# Patient Record
Sex: Female | Born: 1950 | Race: White | Hispanic: No | Smoking: Never smoker
Health system: Southern US, Community
[De-identification: ages and names within clinical notes are randomized; demographics above are authoritative.]

## PROBLEM LIST (undated history)

## (undated) DIAGNOSIS — I499 Cardiac arrhythmia, unspecified: Secondary | ICD-10-CM

## (undated) DIAGNOSIS — K760 Fatty (change of) liver, not elsewhere classified: Secondary | ICD-10-CM

## (undated) DIAGNOSIS — N950 Postmenopausal bleeding: Secondary | ICD-10-CM

## (undated) DIAGNOSIS — I10 Essential (primary) hypertension: Secondary | ICD-10-CM

## (undated) DIAGNOSIS — I4891 Unspecified atrial fibrillation: Secondary | ICD-10-CM

## (undated) DIAGNOSIS — F32A Depression, unspecified: Secondary | ICD-10-CM

## (undated) DIAGNOSIS — M8589 Other specified disorders of bone density and structure, multiple sites: Secondary | ICD-10-CM

## (undated) DIAGNOSIS — C3412 Malignant neoplasm of upper lobe, left bronchus or lung: Secondary | ICD-10-CM

## (undated) DIAGNOSIS — R0600 Dyspnea, unspecified: Secondary | ICD-10-CM

## (undated) DIAGNOSIS — R609 Edema, unspecified: Secondary | ICD-10-CM

## (undated) DIAGNOSIS — F419 Anxiety disorder, unspecified: Secondary | ICD-10-CM

## (undated) DIAGNOSIS — N200 Calculus of kidney: Secondary | ICD-10-CM

## (undated) DIAGNOSIS — C8590 Non-Hodgkin lymphoma, unspecified, unspecified site: Secondary | ICD-10-CM

## (undated) DIAGNOSIS — I480 Paroxysmal atrial fibrillation: Secondary | ICD-10-CM

## (undated) DIAGNOSIS — Z8679 Personal history of other diseases of the circulatory system: Secondary | ICD-10-CM

## (undated) DIAGNOSIS — Z87442 Personal history of urinary calculi: Secondary | ICD-10-CM

## (undated) DIAGNOSIS — I639 Cerebral infarction, unspecified: Secondary | ICD-10-CM

## (undated) DIAGNOSIS — N809 Endometriosis, unspecified: Secondary | ICD-10-CM

## (undated) DIAGNOSIS — R002 Palpitations: Secondary | ICD-10-CM

## (undated) DIAGNOSIS — J309 Allergic rhinitis, unspecified: Secondary | ICD-10-CM

## (undated) DIAGNOSIS — F329 Major depressive disorder, single episode, unspecified: Secondary | ICD-10-CM

## (undated) DIAGNOSIS — B029 Zoster without complications: Secondary | ICD-10-CM

## (undated) DIAGNOSIS — M1990 Unspecified osteoarthritis, unspecified site: Secondary | ICD-10-CM

## (undated) DIAGNOSIS — K589 Irritable bowel syndrome without diarrhea: Secondary | ICD-10-CM

## (undated) DIAGNOSIS — C3490 Malignant neoplasm of unspecified part of unspecified bronchus or lung: Secondary | ICD-10-CM

## (undated) DIAGNOSIS — N189 Chronic kidney disease, unspecified: Secondary | ICD-10-CM

## (undated) HISTORY — PX: CHOLECYSTECTOMY: SHX55

## (undated) HISTORY — PX: AUGMENTATION MAMMAPLASTY: SUR837

## (undated) HISTORY — PX: HERNIA REPAIR: SHX51

## (undated) HISTORY — DX: Essential (primary) hypertension: I10

## (undated) HISTORY — DX: Endometriosis, unspecified: N80.9

## (undated) HISTORY — PX: LUNG REMOVAL, PARTIAL: SHX233

## (undated) HISTORY — PX: DILATION AND CURETTAGE OF UTERUS: SHX78

## (undated) HISTORY — PX: FRACTURE SURGERY: SHX138

## (undated) HISTORY — PX: COLONOSCOPY: SHX174

## (undated) HISTORY — PX: DIAGNOSTIC LAPAROSCOPY: SUR761

## (undated) HISTORY — PX: HEMORRHOIDECTOMY WITH HEMORRHOID BANDING: SHX5633

## (undated) MED FILL — Dexamethasone Sodium Phosphate Inj 100 MG/10ML: INTRAMUSCULAR | Qty: 1 | Status: AC

---

## 2005-07-02 ENCOUNTER — Ambulatory Visit: Payer: Self-pay | Admitting: *Deleted

## 2005-11-27 ENCOUNTER — Ambulatory Visit: Payer: Self-pay | Admitting: Internal Medicine

## 2005-12-11 ENCOUNTER — Emergency Department: Payer: Self-pay | Admitting: Emergency Medicine

## 2005-12-12 ENCOUNTER — Ambulatory Visit: Payer: Self-pay | Admitting: Emergency Medicine

## 2005-12-20 ENCOUNTER — Ambulatory Visit: Payer: Self-pay

## 2005-12-28 ENCOUNTER — Ambulatory Visit: Payer: Self-pay | Admitting: Oncology

## 2006-01-04 ENCOUNTER — Inpatient Hospital Stay: Payer: Self-pay | Admitting: General Surgery

## 2006-01-21 ENCOUNTER — Ambulatory Visit: Payer: Self-pay | Admitting: Oncology

## 2006-02-07 ENCOUNTER — Ambulatory Visit: Payer: Self-pay | Admitting: Oncology

## 2006-03-10 ENCOUNTER — Ambulatory Visit: Payer: Self-pay | Admitting: Oncology

## 2006-04-09 ENCOUNTER — Ambulatory Visit: Payer: Self-pay | Admitting: Oncology

## 2006-05-10 ENCOUNTER — Ambulatory Visit: Payer: Self-pay | Admitting: Oncology

## 2006-06-09 ENCOUNTER — Ambulatory Visit: Payer: Self-pay | Admitting: Oncology

## 2006-07-10 ENCOUNTER — Ambulatory Visit: Payer: Self-pay | Admitting: Oncology

## 2006-08-10 ENCOUNTER — Ambulatory Visit: Payer: Self-pay | Admitting: Oncology

## 2006-09-09 ENCOUNTER — Ambulatory Visit: Payer: Self-pay | Admitting: Oncology

## 2006-11-14 ENCOUNTER — Ambulatory Visit: Payer: Self-pay | Admitting: Oncology

## 2006-11-18 ENCOUNTER — Ambulatory Visit: Payer: Self-pay | Admitting: Oncology

## 2006-12-10 ENCOUNTER — Ambulatory Visit: Payer: Self-pay | Admitting: Oncology

## 2007-01-29 ENCOUNTER — Ambulatory Visit: Payer: Self-pay | Admitting: Oncology

## 2007-02-08 ENCOUNTER — Ambulatory Visit: Payer: Self-pay | Admitting: Oncology

## 2007-03-11 ENCOUNTER — Ambulatory Visit: Payer: Self-pay | Admitting: Oncology

## 2007-05-21 ENCOUNTER — Ambulatory Visit: Payer: Self-pay | Admitting: Oncology

## 2007-06-10 ENCOUNTER — Ambulatory Visit: Payer: Self-pay | Admitting: Oncology

## 2007-07-11 ENCOUNTER — Ambulatory Visit: Payer: Self-pay | Admitting: Oncology

## 2007-08-11 ENCOUNTER — Ambulatory Visit: Payer: Self-pay | Admitting: Oncology

## 2007-08-25 ENCOUNTER — Ambulatory Visit: Payer: Self-pay | Admitting: Internal Medicine

## 2007-08-28 ENCOUNTER — Ambulatory Visit: Payer: Self-pay | Admitting: Oncology

## 2007-09-10 ENCOUNTER — Ambulatory Visit: Payer: Self-pay | Admitting: Oncology

## 2007-10-01 ENCOUNTER — Ambulatory Visit: Payer: Self-pay | Admitting: Oncology

## 2007-10-11 ENCOUNTER — Ambulatory Visit: Payer: Self-pay | Admitting: Oncology

## 2007-11-10 ENCOUNTER — Ambulatory Visit: Payer: Self-pay | Admitting: Oncology

## 2007-12-11 ENCOUNTER — Ambulatory Visit: Payer: Self-pay | Admitting: Oncology

## 2007-12-29 ENCOUNTER — Ambulatory Visit: Payer: Self-pay | Admitting: Unknown Physician Specialty

## 2008-01-19 ENCOUNTER — Ambulatory Visit: Payer: Self-pay | Admitting: Oncology

## 2008-02-08 ENCOUNTER — Ambulatory Visit: Payer: Self-pay | Admitting: Oncology

## 2008-02-11 ENCOUNTER — Ambulatory Visit: Payer: Self-pay | Admitting: Internal Medicine

## 2008-03-10 ENCOUNTER — Ambulatory Visit: Payer: Self-pay | Admitting: Oncology

## 2008-04-09 ENCOUNTER — Ambulatory Visit: Payer: Self-pay | Admitting: Oncology

## 2008-05-10 ENCOUNTER — Ambulatory Visit: Payer: Self-pay | Admitting: Oncology

## 2008-05-31 ENCOUNTER — Ambulatory Visit: Payer: Self-pay | Admitting: Oncology

## 2008-06-09 ENCOUNTER — Ambulatory Visit: Payer: Self-pay | Admitting: Oncology

## 2008-07-05 ENCOUNTER — Ambulatory Visit: Payer: Self-pay | Admitting: Unknown Physician Specialty

## 2008-07-10 ENCOUNTER — Ambulatory Visit: Payer: Self-pay | Admitting: Oncology

## 2008-08-10 ENCOUNTER — Ambulatory Visit: Payer: Self-pay | Admitting: Oncology

## 2008-08-18 ENCOUNTER — Ambulatory Visit: Payer: Self-pay | Admitting: Oncology

## 2008-09-06 ENCOUNTER — Ambulatory Visit: Payer: Self-pay | Admitting: General Practice

## 2008-09-09 ENCOUNTER — Ambulatory Visit: Payer: Self-pay | Admitting: Oncology

## 2008-09-15 ENCOUNTER — Ambulatory Visit: Payer: Self-pay | Admitting: Internal Medicine

## 2008-12-10 ENCOUNTER — Ambulatory Visit: Payer: Self-pay | Admitting: Oncology

## 2008-12-15 ENCOUNTER — Ambulatory Visit: Payer: Self-pay | Admitting: Oncology

## 2009-01-01 ENCOUNTER — Emergency Department: Payer: Self-pay | Admitting: Emergency Medicine

## 2009-01-10 ENCOUNTER — Ambulatory Visit: Payer: Self-pay | Admitting: Oncology

## 2009-02-21 ENCOUNTER — Ambulatory Visit: Payer: Self-pay | Admitting: Oncology

## 2009-02-21 ENCOUNTER — Ambulatory Visit: Payer: Self-pay | Admitting: Internal Medicine

## 2009-03-10 ENCOUNTER — Ambulatory Visit: Payer: Self-pay | Admitting: Internal Medicine

## 2009-03-10 ENCOUNTER — Ambulatory Visit: Payer: Self-pay | Admitting: Oncology

## 2009-04-09 ENCOUNTER — Ambulatory Visit: Payer: Self-pay | Admitting: Oncology

## 2009-04-09 ENCOUNTER — Ambulatory Visit: Payer: Self-pay | Admitting: Internal Medicine

## 2009-05-10 ENCOUNTER — Ambulatory Visit: Payer: Self-pay | Admitting: Oncology

## 2009-05-27 ENCOUNTER — Ambulatory Visit: Payer: Self-pay | Admitting: Oncology

## 2009-06-09 ENCOUNTER — Ambulatory Visit: Payer: Self-pay | Admitting: Oncology

## 2009-07-10 ENCOUNTER — Ambulatory Visit: Payer: Self-pay | Admitting: Oncology

## 2009-07-14 ENCOUNTER — Ambulatory Visit: Payer: Self-pay | Admitting: Oncology

## 2009-08-10 ENCOUNTER — Ambulatory Visit: Payer: Self-pay | Admitting: Oncology

## 2009-09-09 ENCOUNTER — Ambulatory Visit: Payer: Self-pay | Admitting: Oncology

## 2009-11-01 ENCOUNTER — Ambulatory Visit: Payer: Self-pay | Admitting: Oncology

## 2009-11-09 ENCOUNTER — Ambulatory Visit: Payer: Self-pay | Admitting: Oncology

## 2009-11-16 ENCOUNTER — Ambulatory Visit: Payer: Self-pay | Admitting: Oncology

## 2009-12-10 ENCOUNTER — Ambulatory Visit: Payer: Self-pay | Admitting: Oncology

## 2010-03-08 ENCOUNTER — Ambulatory Visit: Payer: Self-pay | Admitting: Internal Medicine

## 2010-03-10 ENCOUNTER — Ambulatory Visit: Payer: Self-pay | Admitting: Oncology

## 2010-03-13 ENCOUNTER — Ambulatory Visit: Payer: Self-pay | Admitting: Oncology

## 2010-04-09 ENCOUNTER — Ambulatory Visit: Payer: Self-pay | Admitting: Oncology

## 2010-06-09 ENCOUNTER — Ambulatory Visit: Payer: Self-pay | Admitting: Oncology

## 2010-07-06 ENCOUNTER — Ambulatory Visit: Payer: Self-pay | Admitting: Oncology

## 2010-07-10 ENCOUNTER — Ambulatory Visit: Payer: Self-pay | Admitting: Oncology

## 2010-10-12 ENCOUNTER — Ambulatory Visit: Payer: Self-pay | Admitting: Oncology

## 2010-11-04 ENCOUNTER — Emergency Department: Payer: Self-pay | Admitting: Emergency Medicine

## 2010-11-09 ENCOUNTER — Ambulatory Visit: Payer: Self-pay | Admitting: Oncology

## 2011-04-12 ENCOUNTER — Ambulatory Visit: Payer: Self-pay | Admitting: Oncology

## 2011-05-11 ENCOUNTER — Ambulatory Visit: Payer: Self-pay | Admitting: Oncology

## 2011-06-06 ENCOUNTER — Emergency Department: Payer: Self-pay

## 2011-08-03 ENCOUNTER — Ambulatory Visit: Payer: Self-pay | Admitting: Otolaryngology

## 2011-10-18 ENCOUNTER — Ambulatory Visit: Payer: Self-pay | Admitting: Oncology

## 2011-10-31 ENCOUNTER — Ambulatory Visit: Payer: Self-pay

## 2011-11-10 ENCOUNTER — Ambulatory Visit: Payer: Self-pay | Admitting: Oncology

## 2011-12-24 ENCOUNTER — Ambulatory Visit: Payer: Self-pay | Admitting: Surgery

## 2011-12-25 LAB — PATHOLOGY REPORT

## 2012-01-16 ENCOUNTER — Ambulatory Visit: Payer: Self-pay | Admitting: Unknown Physician Specialty

## 2012-01-16 LAB — CBC WITH DIFFERENTIAL/PLATELET
Eosinophil #: 0.2 10*3/uL (ref 0.0–0.7)
HCT: 43.1 % (ref 35.0–47.0)
Lymphocyte #: 1.8 10*3/uL (ref 1.0–3.6)
MCH: 32.7 pg (ref 26.0–34.0)
MCHC: 34 g/dL (ref 32.0–36.0)
MCV: 96 fL (ref 80–100)
Monocyte #: 0.3 10*3/uL (ref 0.0–0.7)
Monocyte %: 5.6 %
Neutrophil #: 3.9 10*3/uL (ref 1.4–6.5)
Neutrophil %: 61.3 %
Platelet: 222 10*3/uL (ref 150–440)
RDW: 12.5 % (ref 11.5–14.5)
WBC: 6.2 10*3/uL (ref 3.6–11.0)

## 2012-01-16 LAB — BASIC METABOLIC PANEL
Anion Gap: 8 (ref 7–16)
BUN: 13 mg/dL (ref 7–18)
Calcium, Total: 9.3 mg/dL (ref 8.5–10.1)
Co2: 30 mmol/L (ref 21–32)
Creatinine: 0.56 mg/dL — ABNORMAL LOW (ref 0.60–1.30)
EGFR (African American): >60
EGFR (Non-African Amer.): >60
Glucose: 97 mg/dL (ref 65–99)
Osmolality: 283 (ref 275–301)
Potassium: 3.8 mmol/L (ref 3.5–5.1)
Sodium: 142 mmol/L (ref 136–145)

## 2012-01-22 ENCOUNTER — Ambulatory Visit: Payer: Self-pay | Admitting: Surgery

## 2012-04-22 ENCOUNTER — Ambulatory Visit: Payer: Self-pay | Admitting: Oncology

## 2012-04-22 LAB — CBC CANCER CENTER
Basophil #: 0 x10 3/mm (ref 0.0–0.1)
Basophil %: 0.4 %
Eosinophil #: 0.3 x10 3/mm (ref 0.0–0.7)
Eosinophil %: 4.7 %
HCT: 43 % (ref 35.0–47.0)
HGB: 14.4 g/dL (ref 12.0–16.0)
Lymphocyte #: 1.7 x10 3/mm (ref 1.0–3.6)
Lymphocyte %: 30.6 %
MCHC: 33.3 g/dL (ref 32.0–36.0)
MCV: 96 fL (ref 80–100)
Neutrophil #: 3.3 x10 3/mm (ref 1.4–6.5)
Neutrophil %: 57.8 %
RDW: 13.1 % (ref 11.5–14.5)
WBC: 5.6 x10 3/mm (ref 3.6–11.0)

## 2012-04-22 LAB — COMPREHENSIVE METABOLIC PANEL
Alkaline Phosphatase: 148 U/L — ABNORMAL HIGH (ref 50–136)
Bilirubin,Total: 0.4 mg/dL (ref 0.2–1.0)
EGFR (African American): >60
EGFR (Non-African Amer.): >60
Glucose: 105 mg/dL — ABNORMAL HIGH (ref 65–99)
Potassium: 3.8 mmol/L (ref 3.5–5.1)
SGOT(AST): 114 U/L — ABNORMAL HIGH (ref 15–37)
SGPT (ALT): 155 U/L — ABNORMAL HIGH
Sodium: 141 mmol/L (ref 136–145)
Total Protein: 7.5 g/dL (ref 6.4–8.2)

## 2012-04-22 LAB — CHOLESTEROL, TOTAL: Cholesterol: 222 mg/dL — ABNORMAL HIGH (ref 0–200)

## 2012-05-10 ENCOUNTER — Ambulatory Visit: Payer: Self-pay | Admitting: Oncology

## 2012-09-01 ENCOUNTER — Ambulatory Visit: Payer: Self-pay | Admitting: Oncology

## 2012-09-01 LAB — CBC CANCER CENTER
Basophil %: 0.7 %
Eosinophil #: 0.3 x10 3/mm (ref 0.0–0.7)
Eosinophil %: 5.1 %
HGB: 14.1 g/dL (ref 12.0–16.0)
Lymphocyte #: 1.9 x10 3/mm (ref 1.0–3.6)
MCH: 32.2 pg (ref 26.0–34.0)
MCHC: 33.8 g/dL (ref 32.0–36.0)
MCV: 95 fL (ref 80–100)
Monocyte #: 0.4 x10 3/mm (ref 0.2–0.9)
Neutrophil #: 3.4 x10 3/mm (ref 1.4–6.5)
RBC: 4.39 10*6/uL (ref 3.80–5.20)

## 2012-09-01 LAB — COMPREHENSIVE METABOLIC PANEL
Anion Gap: 8 (ref 7–16)
Calcium, Total: 9.5 mg/dL (ref 8.5–10.1)
Co2: 31 mmol/L (ref 21–32)
EGFR (Non-African Amer.): >60
Osmolality: 282 (ref 275–301)
Potassium: 3.9 mmol/L (ref 3.5–5.1)
SGOT(AST): 104 U/L — ABNORMAL HIGH (ref 15–37)
Sodium: 141 mmol/L (ref 136–145)

## 2012-09-09 ENCOUNTER — Ambulatory Visit: Payer: Self-pay | Admitting: Oncology

## 2012-11-03 ENCOUNTER — Ambulatory Visit: Payer: Self-pay | Admitting: Oncology

## 2012-11-03 LAB — CBC CANCER CENTER
Basophil #: 0 x10 3/mm (ref 0.0–0.1)
Basophil %: 0.8 %
Eosinophil #: 0.2 x10 3/mm (ref 0.0–0.7)
HCT: 42.7 % (ref 35.0–47.0)
Lymphocyte #: 1.4 x10 3/mm (ref 1.0–3.6)
MCH: 32.2 pg (ref 26.0–34.0)
MCHC: 33.7 g/dL (ref 32.0–36.0)
MCV: 95 fL (ref 80–100)
Neutrophil %: 66.8 %
Platelet: 203 x10 3/mm (ref 150–440)
RBC: 4.47 10*6/uL (ref 3.80–5.20)
WBC: 6 x10 3/mm (ref 3.6–11.0)

## 2012-11-03 LAB — COMPREHENSIVE METABOLIC PANEL
Albumin: 3.8 g/dL (ref 3.4–5.0)
Alkaline Phosphatase: 150 U/L — ABNORMAL HIGH (ref 50–136)
BUN: 12 mg/dL (ref 7–18)
Bilirubin,Total: 0.5 mg/dL (ref 0.2–1.0)
Creatinine: 0.72 mg/dL (ref 0.60–1.30)
Glucose: 121 mg/dL — ABNORMAL HIGH (ref 65–99)
SGOT(AST): 99 U/L — ABNORMAL HIGH (ref 15–37)
SGPT (ALT): 153 U/L — ABNORMAL HIGH (ref 12–78)
Total Protein: 7.7 g/dL (ref 6.4–8.2)

## 2012-11-09 ENCOUNTER — Ambulatory Visit: Payer: Self-pay | Admitting: Oncology

## 2012-12-10 ENCOUNTER — Ambulatory Visit: Payer: Self-pay | Admitting: Oncology

## 2012-12-17 ENCOUNTER — Ambulatory Visit: Payer: Self-pay | Admitting: Internal Medicine

## 2012-12-19 ENCOUNTER — Ambulatory Visit: Payer: Self-pay | Admitting: Internal Medicine

## 2013-01-01 ENCOUNTER — Other Ambulatory Visit: Payer: Self-pay | Admitting: Oncology

## 2013-01-01 DIAGNOSIS — C8330 Diffuse large B-cell lymphoma, unspecified site: Secondary | ICD-10-CM

## 2013-01-10 ENCOUNTER — Ambulatory Visit: Payer: Self-pay | Admitting: Oncology

## 2013-03-12 ENCOUNTER — Ambulatory Visit
Admission: RE | Admit: 2013-03-12 | Discharge: 2013-03-12 | Disposition: A | Discharge status: Home / Self Care | Payer: 59 | Source: Ambulatory Visit | Attending: Oncology | Admitting: Oncology

## 2013-03-12 DIAGNOSIS — C8330 Diffuse large B-cell lymphoma, unspecified site: Secondary | ICD-10-CM

## 2013-03-12 MED ORDER — GADOBENATE DIMEGLUMINE 529 MG/ML IV SOLN
18.0000 mL | Freq: Once | INTRAVENOUS | Status: AC | PRN
  Administered 2013-03-12: 18 mL via INTRAVENOUS

## 2013-03-13 ENCOUNTER — Ambulatory Visit: Payer: Self-pay | Admitting: Oncology

## 2013-03-16 ENCOUNTER — Ambulatory Visit: Payer: Self-pay | Admitting: Oncology

## 2013-03-25 ENCOUNTER — Ambulatory Visit: Payer: Self-pay | Admitting: Oncology

## 2013-04-09 ENCOUNTER — Ambulatory Visit: Payer: Self-pay | Admitting: Oncology

## 2013-04-09 LAB — CBC CANCER CENTER
Basophil #: 0 x10 3/mm (ref 0.0–0.1)
Eosinophil #: 0.3 x10 3/mm (ref 0.0–0.7)
HCT: 43.2 % (ref 35.0–47.0)
HGB: 14.5 g/dL (ref 12.0–16.0)
Lymphocyte #: 2.3 x10 3/mm (ref 1.0–3.6)
Lymphocyte %: 36.3 %
MCH: 31.5 pg (ref 26.0–34.0)
MCHC: 33.5 g/dL (ref 32.0–36.0)
MCV: 94 fL (ref 80–100)
Monocyte #: 0.7 x10 3/mm (ref 0.2–0.9)
Neutrophil #: 3.1 x10 3/mm (ref 1.4–6.5)
Platelet: 201 x10 3/mm (ref 150–440)
RBC: 4.6 10*6/uL (ref 3.80–5.20)
RDW: 13.7 % (ref 11.5–14.5)
WBC: 6.4 x10 3/mm (ref 3.6–11.0)

## 2013-04-09 LAB — COMPREHENSIVE METABOLIC PANEL
Alkaline Phosphatase: 141 U/L — ABNORMAL HIGH (ref 50–136)
Bilirubin,Total: 0.5 mg/dL (ref 0.2–1.0)
Calcium, Total: 9.6 mg/dL (ref 8.5–10.1)
Creatinine: 0.74 mg/dL (ref 0.60–1.30)
EGFR (African American): >60
EGFR (Non-African Amer.): >60
Glucose: 88 mg/dL (ref 65–99)
Osmolality: 284 (ref 275–301)
Potassium: 3.9 mmol/L (ref 3.5–5.1)
SGPT (ALT): 152 U/L — ABNORMAL HIGH (ref 12–78)
Sodium: 143 mmol/L (ref 136–145)
Total Protein: 7.4 g/dL (ref 6.4–8.2)

## 2013-04-09 LAB — APTT: Activated PTT: 29.5 secs (ref 23.6–35.9)

## 2013-04-14 DIAGNOSIS — Z01810 Encounter for preprocedural cardiovascular examination: Secondary | ICD-10-CM

## 2013-04-16 ENCOUNTER — Ambulatory Visit: Payer: Self-pay

## 2013-04-16 LAB — URINALYSIS, COMPLETE
Blood: NEGATIVE
Ketone: NEGATIVE
Leukocyte Esterase: NEGATIVE
Ph: 7 (ref 4.5–8.0)
Specific Gravity: 1.014 (ref 1.003–1.030)
Squamous Epithelial: <1
WBC UR: 1 /HPF (ref 0–5)

## 2013-04-30 LAB — APTT: Activated PTT: 25.3 secs (ref 23.6–35.9)

## 2013-05-10 ENCOUNTER — Ambulatory Visit: Payer: Self-pay | Admitting: Oncology

## 2013-05-25 ENCOUNTER — Inpatient Hospital Stay: Payer: Self-pay

## 2013-05-25 LAB — BASIC METABOLIC PANEL
Anion Gap: 6 — ABNORMAL LOW (ref 7–16)
BUN: 10 mg/dL (ref 7–18)
Calcium, Total: 9.4 mg/dL (ref 8.5–10.1)
Creatinine: 0.5 mg/dL — ABNORMAL LOW (ref 0.60–1.30)
EGFR (African American): >60
EGFR (Non-African Amer.): >60
Sodium: 141 mmol/L (ref 136–145)

## 2013-05-25 LAB — CBC WITH DIFFERENTIAL/PLATELET
Basophil #: 0.1 10*3/uL (ref 0.0–0.1)
Eosinophil %: 2.8 %
HCT: 42.1 % (ref 35.0–47.0)
HGB: 14.6 g/dL (ref 12.0–16.0)
Lymphocyte %: 34.9 %
MCH: 32.3 pg (ref 26.0–34.0)
Monocyte #: 0.4 x10 3/mm (ref 0.2–0.9)
Monocyte %: 7.1 %
Neutrophil #: 3.3 10*3/uL (ref 1.4–6.5)
Neutrophil %: 54.4 %
Platelet: 251 10*3/uL (ref 150–440)
RBC: 4.53 10*6/uL (ref 3.80–5.20)
RDW: 13.6 % (ref 11.5–14.5)

## 2013-05-26 LAB — COMPREHENSIVE METABOLIC PANEL
Alkaline Phosphatase: 121 U/L (ref 50–136)
BUN: 9 mg/dL (ref 7–18)
Calcium, Total: 8.7 mg/dL (ref 8.5–10.1)
Chloride: 103 mmol/L (ref 98–107)
Creatinine: 0.66 mg/dL (ref 0.60–1.30)
EGFR (African American): >60
EGFR (Non-African Amer.): >60
Glucose: 162 mg/dL — ABNORMAL HIGH (ref 65–99)
Osmolality: 280 (ref 275–301)
Potassium: 3.6 mmol/L (ref 3.5–5.1)
SGOT(AST): 143 U/L — ABNORMAL HIGH (ref 15–37)
Sodium: 139 mmol/L (ref 136–145)
Total Protein: 6.8 g/dL (ref 6.4–8.2)

## 2013-05-26 LAB — CBC WITH DIFFERENTIAL/PLATELET
Basophil %: 0.2 %
Lymphocyte %: 8.9 %
MCH: 32.2 pg (ref 26.0–34.0)
MCHC: 34.3 g/dL (ref 32.0–36.0)
MCV: 94 fL (ref 80–100)
Monocyte %: 7.3 %
Platelet: 233 10*3/uL (ref 150–440)
RBC: 4.21 10*6/uL (ref 3.80–5.20)
RDW: 13.7 % (ref 11.5–14.5)

## 2013-05-28 LAB — CBC WITH DIFFERENTIAL/PLATELET
Basophil #: 0.1 10*3/uL (ref 0.0–0.1)
Eosinophil #: 0.4 10*3/uL (ref 0.0–0.7)
Eosinophil %: 3.3 %
HCT: 39.8 % (ref 35.0–47.0)
HGB: 13.8 g/dL (ref 12.0–16.0)
MCH: 32.7 pg (ref 26.0–34.0)
MCHC: 34.7 g/dL (ref 32.0–36.0)
MCV: 94 fL (ref 80–100)
Monocyte #: 0.8 x10 3/mm (ref 0.2–0.9)
Monocyte %: 6.4 %
Neutrophil %: 68.3 %
RBC: 4.22 10*6/uL (ref 3.80–5.20)
WBC: 11.9 10*3/uL — ABNORMAL HIGH (ref 3.6–11.0)

## 2013-05-28 LAB — COMPREHENSIVE METABOLIC PANEL
Albumin: 3 g/dL — ABNORMAL LOW (ref 3.4–5.0)
Alkaline Phosphatase: 111 U/L (ref 50–136)
BUN: 8 mg/dL (ref 7–18)
Bilirubin,Total: 0.6 mg/dL (ref 0.2–1.0)
Calcium, Total: 9.2 mg/dL (ref 8.5–10.1)
EGFR (Non-African Amer.): >60
Glucose: 117 mg/dL — ABNORMAL HIGH (ref 65–99)
Osmolality: 275 (ref 275–301)
Potassium: 3.8 mmol/L (ref 3.5–5.1)
SGOT(AST): 58 U/L — ABNORMAL HIGH (ref 15–37)
SGPT (ALT): 78 U/L (ref 12–78)
Sodium: 138 mmol/L (ref 136–145)
Total Protein: 6.9 g/dL (ref 6.4–8.2)

## 2013-05-29 LAB — COMPREHENSIVE METABOLIC PANEL
Albumin: 2.9 g/dL — ABNORMAL LOW (ref 3.4–5.0)
Alkaline Phosphatase: 112 U/L (ref 50–136)
BUN: 9 mg/dL (ref 7–18)
Chloride: 97 mmol/L — ABNORMAL LOW (ref 98–107)
Co2: 36 mmol/L — ABNORMAL HIGH (ref 21–32)
EGFR (Non-African Amer.): >60
Glucose: 110 mg/dL — ABNORMAL HIGH (ref 65–99)
Osmolality: 273 (ref 275–301)
Potassium: 3.3 mmol/L — ABNORMAL LOW (ref 3.5–5.1)
SGOT(AST): 55 U/L — ABNORMAL HIGH (ref 15–37)
SGPT (ALT): 69 U/L (ref 12–78)
Sodium: 137 mmol/L (ref 136–145)

## 2013-05-29 LAB — CBC WITH DIFFERENTIAL/PLATELET
Basophil %: 0.7 %
Eosinophil %: 4.1 %
HCT: 38.5 % (ref 35.0–47.0)
HGB: 13.7 g/dL (ref 12.0–16.0)
Lymphocyte %: 21.9 %
MCH: 32.9 pg (ref 26.0–34.0)
MCHC: 35.6 g/dL (ref 32.0–36.0)
Monocyte #: 0.7 x10 3/mm (ref 0.2–0.9)
Monocyte %: 6.8 %
Neutrophil #: 7.1 10*3/uL — ABNORMAL HIGH (ref 1.4–6.5)
Neutrophil %: 66.5 %
RBC: 4.16 10*6/uL (ref 3.80–5.20)
WBC: 10.7 10*3/uL (ref 3.6–11.0)

## 2013-05-30 LAB — COMPREHENSIVE METABOLIC PANEL
Albumin: 2.8 g/dL — ABNORMAL LOW (ref 3.4–5.0)
Co2: 37 mmol/L — ABNORMAL HIGH (ref 21–32)
Creatinine: 0.52 mg/dL — ABNORMAL LOW (ref 0.60–1.30)
EGFR (Non-African Amer.): >60
Glucose: 113 mg/dL — ABNORMAL HIGH (ref 65–99)
Osmolality: 273 (ref 275–301)
Potassium: 3.2 mmol/L — ABNORMAL LOW (ref 3.5–5.1)
SGOT(AST): 53 U/L — ABNORMAL HIGH (ref 15–37)
SGPT (ALT): 67 U/L (ref 12–78)
Sodium: 137 mmol/L (ref 136–145)

## 2013-05-30 LAB — CBC WITH DIFFERENTIAL/PLATELET
Basophil #: 0.1 10*3/uL (ref 0.0–0.1)
Eosinophil #: 0.5 10*3/uL (ref 0.0–0.7)
Eosinophil %: 4.9 %
HGB: 13.6 g/dL (ref 12.0–16.0)
Lymphocyte #: 2.4 10*3/uL (ref 1.0–3.6)
MCH: 32.6 pg (ref 26.0–34.0)
MCV: 93 fL (ref 80–100)
Neutrophil #: 5.5 10*3/uL (ref 1.4–6.5)
Platelet: 263 10*3/uL (ref 150–440)
RBC: 4.16 10*6/uL (ref 3.80–5.20)

## 2013-05-31 LAB — BASIC METABOLIC PANEL
Anion Gap: 3 — ABNORMAL LOW (ref 7–16)
Calcium, Total: 9.1 mg/dL (ref 8.5–10.1)
Co2: 36 mmol/L — ABNORMAL HIGH (ref 21–32)
Creatinine: 0.58 mg/dL — ABNORMAL LOW (ref 0.60–1.30)
EGFR (African American): >60
EGFR (Non-African Amer.): >60
Potassium: 3.7 mmol/L (ref 3.5–5.1)
Sodium: 139 mmol/L (ref 136–145)

## 2013-06-09 ENCOUNTER — Ambulatory Visit: Payer: Self-pay | Admitting: Oncology

## 2013-06-28 DIAGNOSIS — C3412 Malignant neoplasm of upper lobe, left bronchus or lung: Secondary | ICD-10-CM | POA: Insufficient documentation

## 2013-07-10 ENCOUNTER — Ambulatory Visit: Payer: Self-pay | Admitting: Oncology

## 2013-10-01 ENCOUNTER — Ambulatory Visit: Payer: Self-pay | Admitting: Oncology

## 2013-10-08 ENCOUNTER — Ambulatory Visit: Payer: Self-pay | Admitting: Oncology

## 2013-10-10 ENCOUNTER — Ambulatory Visit: Payer: Self-pay | Admitting: Oncology

## 2013-12-25 ENCOUNTER — Ambulatory Visit: Payer: Self-pay | Admitting: Unknown Physician Specialty

## 2013-12-28 LAB — PATHOLOGY REPORT

## 2014-01-08 ENCOUNTER — Other Ambulatory Visit: Payer: Self-pay | Admitting: Unknown Physician Specialty

## 2014-01-08 DIAGNOSIS — Z800 Family history of malignant neoplasm of digestive organs: Secondary | ICD-10-CM

## 2014-01-08 DIAGNOSIS — Q438 Other specified congenital malformations of intestine: Secondary | ICD-10-CM

## 2014-01-13 ENCOUNTER — Other Ambulatory Visit: Payer: 59

## 2014-02-03 ENCOUNTER — Ambulatory Visit: Payer: Self-pay | Admitting: Oncology

## 2014-02-11 ENCOUNTER — Ambulatory Visit: Payer: Self-pay | Admitting: Oncology

## 2014-02-11 LAB — SEDIMENTATION RATE: Erythrocyte Sed Rate: 18 mm/hr (ref 0–30)

## 2014-02-11 LAB — COMPREHENSIVE METABOLIC PANEL
ALBUMIN: 3.6 g/dL (ref 3.4–5.0)
ALK PHOS: 156 U/L — AB
ALT: 163 U/L — AB (ref 12–78)
AST: 122 U/L — AB (ref 15–37)
Anion Gap: 8 (ref 7–16)
BUN: 13 mg/dL (ref 7–18)
Bilirubin,Total: 0.5 mg/dL (ref 0.2–1.0)
CREATININE: 0.65 mg/dL (ref 0.60–1.30)
Calcium, Total: 8.5 mg/dL (ref 8.5–10.1)
Chloride: 105 mmol/L (ref 98–107)
Co2: 28 mmol/L (ref 21–32)
EGFR (African American): >60
EGFR (Non-African Amer.): >60
Glucose: 114 mg/dL — ABNORMAL HIGH (ref 65–99)
Osmolality: 282 (ref 275–301)
Potassium: 3.9 mmol/L (ref 3.5–5.1)
Sodium: 141 mmol/L (ref 136–145)
TOTAL PROTEIN: 7.4 g/dL (ref 6.4–8.2)

## 2014-02-11 LAB — CBC CANCER CENTER
Basophil #: 0.1 x10 3/mm (ref 0.0–0.1)
Basophil %: 0.9 %
Eosinophil #: 0.3 x10 3/mm (ref 0.0–0.7)
Eosinophil %: 4.1 %
HCT: 42.8 % (ref 35.0–47.0)
HGB: 14.2 g/dL (ref 12.0–16.0)
LYMPHS PCT: 31.5 %
Lymphocyte #: 2 x10 3/mm (ref 1.0–3.6)
MCH: 31.5 pg (ref 26.0–34.0)
MCHC: 33.2 g/dL (ref 32.0–36.0)
MCV: 95 fL (ref 80–100)
Monocyte #: 0.4 x10 3/mm (ref 0.2–0.9)
Monocyte %: 7 %
NEUTROS ABS: 3.5 x10 3/mm (ref 1.4–6.5)
Neutrophil %: 56.5 %
PLATELETS: 208 x10 3/mm (ref 150–440)
RBC: 4.52 10*6/uL (ref 3.80–5.20)
RDW: 13.3 % (ref 11.5–14.5)
WBC: 6.2 x10 3/mm (ref 3.6–11.0)

## 2014-02-11 LAB — LACTATE DEHYDROGENASE: LDH: 205 U/L (ref 81–246)

## 2014-02-11 LAB — CHOLESTEROL, TOTAL: Cholesterol: 222 mg/dL — ABNORMAL HIGH (ref 0–200)

## 2014-03-10 ENCOUNTER — Ambulatory Visit: Payer: Self-pay | Admitting: Oncology

## 2014-04-16 DIAGNOSIS — C8590 Non-Hodgkin lymphoma, unspecified, unspecified site: Secondary | ICD-10-CM | POA: Insufficient documentation

## 2014-04-16 DIAGNOSIS — I10 Essential (primary) hypertension: Secondary | ICD-10-CM | POA: Insufficient documentation

## 2014-04-16 DIAGNOSIS — F419 Anxiety disorder, unspecified: Secondary | ICD-10-CM | POA: Insufficient documentation

## 2014-04-16 DIAGNOSIS — E785 Hyperlipidemia, unspecified: Secondary | ICD-10-CM | POA: Insufficient documentation

## 2014-04-16 DIAGNOSIS — Z8679 Personal history of other diseases of the circulatory system: Secondary | ICD-10-CM | POA: Insufficient documentation

## 2014-04-16 HISTORY — DX: Essential (primary) hypertension: I10

## 2014-04-30 DIAGNOSIS — R7989 Other specified abnormal findings of blood chemistry: Secondary | ICD-10-CM | POA: Insufficient documentation

## 2014-04-30 DIAGNOSIS — R609 Edema, unspecified: Secondary | ICD-10-CM | POA: Insufficient documentation

## 2014-04-30 DIAGNOSIS — R945 Abnormal results of liver function studies: Secondary | ICD-10-CM | POA: Insufficient documentation

## 2014-06-01 ENCOUNTER — Ambulatory Visit: Payer: Self-pay | Admitting: Oncology

## 2014-06-21 ENCOUNTER — Ambulatory Visit: Payer: Self-pay | Admitting: Oncology

## 2014-06-21 LAB — CBC CANCER CENTER
Basophil #: 0.1 x10 3/mm (ref 0.0–0.1)
Basophil %: 0.9 %
EOS ABS: 0.2 x10 3/mm (ref 0.0–0.7)
Eosinophil %: 3.7 %
HCT: 44.3 % (ref 35.0–47.0)
HGB: 14.6 g/dL (ref 12.0–16.0)
LYMPHS PCT: 28 %
Lymphocyte #: 1.8 x10 3/mm (ref 1.0–3.6)
MCH: 31.6 pg (ref 26.0–34.0)
MCHC: 33 g/dL (ref 32.0–36.0)
MCV: 96 fL (ref 80–100)
MONOS PCT: 6.3 %
Monocyte #: 0.4 x10 3/mm (ref 0.2–0.9)
Neutrophil #: 4 x10 3/mm (ref 1.4–6.5)
Neutrophil %: 61.1 %
Platelet: 208 x10 3/mm (ref 150–440)
RBC: 4.63 10*6/uL (ref 3.80–5.20)
RDW: 13.5 % (ref 11.5–14.5)
WBC: 6.5 x10 3/mm (ref 3.6–11.0)

## 2014-06-21 LAB — LACTATE DEHYDROGENASE: LDH: 206 U/L (ref 81–246)

## 2014-06-21 LAB — COMPREHENSIVE METABOLIC PANEL
ANION GAP: 7 (ref 7–16)
AST: 87 U/L — AB (ref 15–37)
Albumin: 3.7 g/dL (ref 3.4–5.0)
Alkaline Phosphatase: 170 U/L — ABNORMAL HIGH
BILIRUBIN TOTAL: 0.5 mg/dL (ref 0.2–1.0)
BUN: 14 mg/dL (ref 7–18)
CHLORIDE: 103 mmol/L (ref 98–107)
CO2: 30 mmol/L (ref 21–32)
CREATININE: 0.58 mg/dL — AB (ref 0.60–1.30)
Calcium, Total: 9.3 mg/dL (ref 8.5–10.1)
GLUCOSE: 106 mg/dL — AB (ref 65–99)
Osmolality: 280 (ref 275–301)
POTASSIUM: 3.9 mmol/L (ref 3.5–5.1)
SGPT (ALT): 109 U/L — ABNORMAL HIGH (ref 12–78)
SODIUM: 140 mmol/L (ref 136–145)
Total Protein: 7.6 g/dL (ref 6.4–8.2)

## 2014-06-21 LAB — CHOLESTEROL, TOTAL: Cholesterol: 221 mg/dL — ABNORMAL HIGH (ref 0–200)

## 2014-07-10 ENCOUNTER — Ambulatory Visit: Payer: Self-pay | Admitting: Oncology

## 2014-09-16 ENCOUNTER — Ambulatory Visit: Payer: Self-pay | Admitting: Oncology

## 2014-09-20 ENCOUNTER — Ambulatory Visit: Payer: Self-pay | Admitting: Oncology

## 2014-09-20 LAB — COMPREHENSIVE METABOLIC PANEL
ANION GAP: 5 — AB (ref 7–16)
AST: 92 U/L — AB (ref 15–37)
Albumin: 3.9 g/dL (ref 3.4–5.0)
Alkaline Phosphatase: 170 U/L — ABNORMAL HIGH
BUN: 15 mg/dL (ref 7–18)
Bilirubin,Total: 0.5 mg/dL (ref 0.2–1.0)
CHLORIDE: 103 mmol/L (ref 98–107)
CREATININE: 0.74 mg/dL (ref 0.60–1.30)
Calcium, Total: 9.7 mg/dL (ref 8.5–10.1)
Co2: 32 mmol/L (ref 21–32)
EGFR (Non-African Amer.): >60
Glucose: 103 mg/dL — ABNORMAL HIGH (ref 65–99)
Osmolality: 280 (ref 275–301)
POTASSIUM: 4.7 mmol/L (ref 3.5–5.1)
SGPT (ALT): 143 U/L — ABNORMAL HIGH
Sodium: 140 mmol/L (ref 136–145)
Total Protein: 7.6 g/dL (ref 6.4–8.2)

## 2014-09-20 LAB — CBC CANCER CENTER
Basophil #: 0.1 x10 3/mm (ref 0.0–0.1)
Basophil %: 1 %
EOS PCT: 3 %
Eosinophil #: 0.2 x10 3/mm (ref 0.0–0.7)
HCT: 44.7 % (ref 35.0–47.0)
HGB: 15 g/dL (ref 12.0–16.0)
Lymphocyte #: 1.7 x10 3/mm (ref 1.0–3.6)
Lymphocyte %: 28.3 %
MCH: 32.3 pg (ref 26.0–34.0)
MCHC: 33.5 g/dL (ref 32.0–36.0)
MCV: 96 fL (ref 80–100)
Monocyte #: 0.4 x10 3/mm (ref 0.2–0.9)
Monocyte %: 6.6 %
NEUTROS ABS: 3.7 x10 3/mm (ref 1.4–6.5)
Neutrophil %: 61.1 %
PLATELETS: 222 x10 3/mm (ref 150–440)
RBC: 4.64 10*6/uL (ref 3.80–5.20)
RDW: 13.3 % (ref 11.5–14.5)
WBC: 6.1 x10 3/mm (ref 3.6–11.0)

## 2014-09-20 LAB — LACTATE DEHYDROGENASE: LDH: 220 U/L (ref 81–246)

## 2014-10-10 ENCOUNTER — Ambulatory Visit: Payer: Self-pay | Admitting: Oncology

## 2014-11-02 ENCOUNTER — Ambulatory Visit: Payer: Self-pay | Admitting: Internal Medicine

## 2015-02-18 ENCOUNTER — Ambulatory Visit: Payer: Self-pay | Admitting: Internal Medicine

## 2015-03-11 ENCOUNTER — Ambulatory Visit
Admit: 2015-03-11 | Disposition: A | Discharge status: Home / Self Care | Payer: Self-pay | Attending: Oncology | Admitting: Oncology

## 2015-03-14 LAB — CBC CANCER CENTER
Basophil #: 0.1 x10 3/mm (ref 0.0–0.1)
Basophil %: 0.8 %
Eosinophil #: 0.3 x10 3/mm (ref 0.0–0.7)
Eosinophil %: 3.4 %
HCT: 41.6 % (ref 35.0–47.0)
HGB: 14.4 g/dL (ref 12.0–16.0)
Lymphocyte #: 2 x10 3/mm (ref 1.0–3.6)
Lymphocyte %: 26.1 %
MCH: 32.5 pg (ref 26.0–34.0)
MCHC: 34.7 g/dL (ref 32.0–36.0)
MCV: 94 fL (ref 80–100)
MONO ABS: 0.6 x10 3/mm (ref 0.2–0.9)
Monocyte %: 7.3 %
Neutrophil #: 4.7 x10 3/mm (ref 1.4–6.5)
Neutrophil %: 62.4 %
PLATELETS: 189 x10 3/mm (ref 150–440)
RBC: 4.44 10*6/uL (ref 3.80–5.20)
RDW: 13.3 % (ref 11.5–14.5)
WBC: 7.5 x10 3/mm (ref 3.6–11.0)

## 2015-03-14 LAB — COMPREHENSIVE METABOLIC PANEL
ANION GAP: 8 (ref 7–16)
Albumin: 4.3 g/dL
Alkaline Phosphatase: 164 U/L — ABNORMAL HIGH
BILIRUBIN TOTAL: 0.8 mg/dL
BUN: 19 mg/dL
CREATININE: 0.56 mg/dL
Calcium, Total: 9.3 mg/dL
Chloride: 103 mmol/L
Co2: 28 mmol/L
EGFR (African American): >60
EGFR (Non-African Amer.): >60
Glucose: 111 mg/dL — ABNORMAL HIGH
Potassium: 4.1 mmol/L
SGOT(AST): 80 U/L — ABNORMAL HIGH
SGPT (ALT): 87 U/L — ABNORMAL HIGH
SODIUM: 139 mmol/L
TOTAL PROTEIN: 7.7 g/dL

## 2015-03-14 LAB — LACTATE DEHYDROGENASE: LDH: 187 U/L

## 2015-04-01 NOTE — Consult Note (Signed)
Chief Complaint:  Subjective/Chief Complaint Pt seen/examined. See dictation for details.   VITAL SIGNS/ANCILLARY NOTES: **Vital Signs.:   18-Jun-14 01:49  Temperature Temperature (F) 98.4  Pulse Pulse 93  Systolic BP Systolic BP 774  Diastolic BP (mmHg) Diastolic BP (mmHg) 93  Pulse Ox % Pulse Ox % 95  Oxygen Delivery 2L    06:00  Temperature Temperature (F) 98  Pulse Pulse 93  Systolic BP Systolic BP 128  Diastolic BP (mmHg) Diastolic BP (mmHg) 93  Pulse Ox % Pulse Ox % 95  Oxygen Delivery 2L   Brief Assessment:  GEN no acute distress   Cardiac Regular   Respiratory rhonchi   Gastrointestinal details normal Soft  Nontender  Bowel sounds normal   EXTR positive edema   Lab Results: Routine Chem:  16-Jun-14 12:12   Glucose, Serum 98  BUN 10  Creatinine (comp)  0.50  Sodium, Serum 141  Potassium, Serum 3.9  Chloride, Serum  108  CO2, Serum 27  eGFR (Non-African American) >60 (eGFR values <66m/min/1.73 m2 may be an indication of chronic kidney disease (CKD). Calculated eGFR is useful in patients with stable renal function. The eGFR calculation will not be reliable in acutely ill patients when serum creatinine is changing rapidly. It is not useful in  patients on dialysis. The eGFR calculation may not be applicable to patients at the low and high extremes of body sizes, pregnant women, and vegetarians.)  17-Jun-14 04:35   Glucose, Serum  162  BUN 9  Creatinine (comp) 0.66  Sodium, Serum 139  Potassium, Serum 3.6  Chloride, Serum 103  CO2, Serum 32  eGFR (Non-African American) >60 (eGFR values <677mmin/1.73 m2 may be an indication of chronic kidney disease (CKD). Calculated eGFR is useful in patients with stable renal function. The eGFR calculation will not be reliable in acutely ill patients when serum creatinine is changing rapidly. It is not useful in  patients on dialysis. The eGFR calculation may not be applicable to patients at the low and high  extremes of body sizes, pregnant women, and vegetarians.)  Routine Hem:  16-Jun-14 12:12   WBC (CBC) 6.2  Hemoglobin (CBC) 14.6  Platelet Count (CBC) 251  17-Jun-14 04:35   WBC (CBC)  14.4  Hemoglobin (CBC) 13.6  Platelet Count (CBC) 233   Radiology Results: XRay:    17-Jun-14 06:05, Chest Portable Single View  Chest Portable Single View   REASON FOR EXAM:    Assess for Pleural Effusion  COMMENTS:       PROCEDURE: DXR - DXR PORTABLE CHEST SINGLE VIEW  - May 26 2013  6:05AM     RESULT: Comparison: 05/25/2013    Findings:  The heart and mediastinum are stable. Two left-sided chest tubes are in   place. No definite pneumothorax. Left-sided soft tissue gas is decreased   from prior. Interstitial opacities in the right lung have essentially   resolved.    IMPRESSION:   1. No definite left pneumothorax.  2. Resolution of interstitial opacities in the right lung.    Dictation site: 2        Verified By: ROGregor HamsM.D., MD   Assessment/Plan:  Assessment/Plan:  Plan Will resume HCTZ and low dose beta-blocker. Follow sugars. Wean O2. F/u labs in AM. Will follow with you.   Electronic Signatures: SpIdelle CrouchMD)  (Signed 18-Jun-14 07:34)  Authored: Chief Complaint, VITAL SIGNS/ANCILLARY NOTES, Brief Assessment, Lab Results, Radiology Results, Assessment/Plan   Last Updated: 18-Jun-14 07:34 by SpIdelle Crouch  MD) 

## 2015-04-01 NOTE — Consult Note (Signed)
PATIENT NAME:  Julie Jennings, Julie Jennings MR#:  [id number] DATE OF BIRTH:  [birth year]  DATE OF CONSULTATION:  05/27/2013  REFERRING PHYSICIAN:  Timothy E. Genevive Bi, MD CONSULTING PHYSICIAN:  Leonie Douglas. Doy Hutching, MD  FAMILY PHYSICIAN: Leonie Douglas. Doy Hutching, MD  REASON FOR CONSULTATION: Medical management in postoperative period.   HISTORY OF PRESENT ILLNESS: The patient is a 64 year old female with a significant history of benign hypertension, PSVT, hyperlipidemia, anxiety and osteoarthritis. Also has a history of gastric lymphoma, followed by Dr. Oliva Bustard. Recently diagnosed with a left upper lung mass, which was presumably cancerous. The patient was admitted by Dr. Genevive Bi and underwent left upper lobectomy. 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 She is now postoperative from left upper lobectomy, and consultation was subsequently requested. The patient is complaining of pain at the surgical site, with some pain with inspiration. Had nausea yesterday, but this has resolved. Denies abdominal pain. No dysuria or hematuria.   PAST MEDICAL HISTORY:  1. Anxiety/depression.  2. Chronic pelvic pain due to endometriosis.  3. Osteoarthritis.  4. Nephrolithiasis.  5. History of hepatic steatosis, grade 2.  6. Irritable bowel syndrome.  7. Hyperlipidemia.  8. Benign hypertension.  9. Chronic palpitations.  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  11. Allergic rhinitis.  12. History of herpes zoster.  13. History of left eye stroke.  14. Status post bilateral breast implants.  15. Status post cholecystectomy.   MEDICATIONS ON ADMISSION:  1. Bystolic 10 mg p.o. daily.  2. Hydrochlorothiazide 25 mg p.o. daily.  3. AcipHex 20 mg p.o. daily.  4. Xanax 0.5 mg p.o. t.i.d.  5. Multivitamin 1 p.o. daily.  6. Claritin 10 mg p.o. daily. 7. Effexor-XR 75 mg p.o. b.i.d.  8. Spiriva 1 capsule inhaled daily.   ALLERGIES: BUSPAR, CODEINE AND LOVASTATIN.   SOCIAL HISTORY: Negative for alcohol or tobacco abuse. She is  married.   FAMILY HISTORY: Positive for colon cancer and diabetes.   REVIEW OF SYSTEMS:  CONSTITUTIONAL: No fever or change in weight.  EYES: No blurred or double vision. No glaucoma.  ENT: No tinnitus or hearing loss. No nasal discharge or bleeding. No difficulty swallowing.  RESPIRATORY: No cough or wheezing. Denies hemoptysis.  CARDIOVASCULAR: Chest pain as per HPI. No current palpitations. Denies syncope. No orthopnea.  GASTROINTESTINAL: No nausea, vomiting or diarrhea. No abdominal pain. No change in bowel habits.  GENITOURINARY: No dysuria or hematuria. No incontinence.  ENDOCRINE: No polyuria or polydipsia. No heat or cold intolerance.  HEMATOLOGIC: The patient denies anemia, easy bruising or bleeding.  LYMPHATIC: No swollen glands.  MUSCULOSKELETAL: The patient denies pain in her neck, back, shoulders, knees or hips. No gout.  NEUROLOGIC: No numbness or weakness. Denies migraines or seizures. Stroke as per HPI.  PSYCHIATRIC: The patient denies anxiety, insomnia or depression.   PHYSICAL EXAMINATION:  GENERAL: The patient is in no acute distress.  VITAL SIGNS: Currently remarkable for a blood pressure of 155/93 with a heart rate of 93, respiratory rate of 19. Sat is 95% on 2 liters. Temperature is 98.0.  HEENT: Normocephalic, atraumatic. Pupils equal, round and reactive to light and accommodation. Extraocular movements are intact. Sclerae are nonicteric. Conjunctivae are clear. Oropharynx is clear.  NECK: Supple, without JVD. No adenopathy or thyromegaly is noted.  LUNGS: Reveal rhonchi on the left. No wheezes or rales. Chest wall is tender.  CARDIAC: Regular rate and rhythm with a normal S1 and S2. No significant rubs, murmurs or gallops. PMI is nondisplaced.  ABDOMEN: Soft, nontender, with normoactive bowel  sounds. No organomegaly or masses were appreciated. No hernias or bruits were noted.  EXTREMITIES: Revealed 1+ edema. No clubbing or cyanosis. Pulses were 2+ bilaterally.   SKIN: Warm and dry without rash or lesions.  NEUROLOGIC: Cranial nerves II through XII grossly intact. Deep tendon reflexes were symmetric. Motor and sensory exam is nonfocal.  PSYCHIATRIC: Revealed a patient who was alert and oriented to person, place and time. She was cooperative and used good judgment.   LABORATORY DATA: Labs from yesterday revealed a glucose of 162 with a BUN of 9, creatinine 0.66 with a sodium of 139, a potassium of 3.6 and a GFR greater than 60. Her ALT was 148, and her AST was 143. CBC showed a white count of 14.4 with a hemoglobin of 13.6. Portable chest x-ray revealed no left pneumothorax and was essentially stable postoperative.   ASSESSMENT:  1. Anxiety/depression.  2. Benign hypertension.  3. Paroxysmal supraventricular tachycardia.  4. Venous stasis with peripheral edema.  5. Hyperlipidemia.  6. History of hepatic steatosis with abnormal liver function tests. 7. History of gastric lymphoma.  8. Hyperglycemia.  9. Leukocytosis.   PLAN: Will resume her hydrochlorothiazide and low-dose beta blocker. Will use Flonase for nasal congestion. Will follow her sugars with Accu-Cheks q.a.c. and at bedtime. Will resume Spiriva. Agree with SVNs and incentive spirometry. Will try to wean her oxygen as tolerated. Followup x-rays per Dr. Genevive Bi. Repeat labs in a.m., including LFTs.   Thank you for the consultation. Will continue to follow this patient with you while in the hospital. Please call if questions arise.   ____________________________ Leonie Douglas. Doy Hutching, MD jds:OSi D: 05/27/2013 07:41:51 ET T: 05/27/2013 08:24:11 ET JOB#: [id number]  cc: Leonie Douglas. Doy Hutching, MD, <Dictator> JEFFREY Lennice Sites MD ELECTRONICALLY SIGNED 05/27/2013 10:06

## 2015-04-01 NOTE — Op Note (Signed)
PATIENT NAME:  Julie Jennings, Julie Jennings MR#:  [id number] DATE OF BIRTH:  1951-10-24  DATE OF PROCEDURE:  05/25/2013  SURGEON: Christia Reading E. Genevive Bi, M.D.   ASSISTANT: Bronson Ing, MD.   PREOPERATIVE DIAGNOSIS: Left upper lobe mass.   POSTOPERATIVE DIAGNOSIS: Left upper lobe mass (frozen section consistent with adenocarcinoma).   OPERATION PERFORMED:  1. Preoperative bronchoscopy to assess endobronchial anatomy.  2. Left thoracotomy and left upper lobectomy.   INDICATIONS FOR PROCEDURE: [name] is a 64 year old woman with a history of lymphoma who has been followed by our oncology department and recently found to have a left upper lobe mass. After extensive evaluation, the patient was found to be a suitable candidate for surgery, and she was offered the above-named procedure for definitive diagnosis and treatment.   DESCRIPTION OF PROCEDURE: The patient was brought to the operating suite and placed in the supine position. General endotracheal anesthesia was given through a double-lumen tube. Preoperative bronchoscopy was carried out. There was no evidence of endobronchial tumor. The patient was then positioned for a left thoracotomy. All pressure points were carefully padded. The patient was prepped and draped in the usual sterile fashion. A posterolateral fifth interspace thoracotomy was performed. The chest was entered. Palpation of the upper lobe revealed a mass present within the midportion of the left upper lobe. It was wedged for diagnosis only as we could not obtain sufficient margins for adequate therapy with a wedge resection alone. The frozen section was consistent with an adenocarcinoma. We then elected to proceed on with a left upper lobectomy. The fissure anteriorly was nearly complete, and we completed that with electrocautery. The fissure posteriorly was completed with a stapler. We then identified the inferior pulmonary vein after the inferior pulmonary ligament was divided. We then divided all of the  arterial and venous branches to the upper lobe. The left upper lobe bronchus was secured with a TLH stapler. The lower lobe bronchus ventilated nicely. The stapler was fired, and the bronchus was transected. The chest was then copiously irrigated, and there was no evidence of an air leak from the bronchial stump at 30 cm of water pressure. We did use Progel on some of the cut surfaces of the lung. The chest was drained with 2 chest tubes, and angled and a straight in standard fashion. The chest was then closed in standard fashion using #2 Vicryl paracostal sutures. The muscles of the chest wall were closed with #2 Vicryl, the subcutaneous tissues with 2-0 Vicryl and the skin with skin clips. The patient was rolled into the supine position where she was extubated and taken to the recovery room in stable condition.   ____________________________ Lew Dawes Genevive Bi, MD teo:gb D: 05/25/2013 17:43:29 ET T: 05/26/2013 05:13:39 ET JOB#: [id number]  cc: Christia Reading E. Genevive Bi, MD, <Dictator> Leonie Douglas. Doy Hutching, MD Martie Lee. Oliva Bustard, MD Louis Matte MD ELECTRONICALLY SIGNED 06/02/2013 11:01

## 2015-04-01 NOTE — Discharge Summary (Signed)
PATIENT NAME:  Julie Jennings, Julie Jennings MR#:  [id number] DATE OF BIRTH:  20-Oct-1951  DATE OF ADMISSION:  05/25/2013 DATE OF DISCHARGE:  06/01/2013  ADMITTING DIAGNOSIS: Left upper lobe mass.   DISCHARGE DIAGNOSIS: Adenocarcinoma, left upper lobe, stage IA.   HOSPITAL COURSE: Mrs. Kansas Spainhower is a 64 year old white female who was recently found to have a left upper lobe mass. She underwent an extensive evaluation and was found to be a suitable candidate for surgery. She was taken to the operating room on the 05/25/2013 where she underwent a left thoracotomy and left upper lobectomy. That procedure demonstrated a stage IA carcinoma of the lung. Pathology was consistent with an adenocarcinoma, and all lymph nodes were negative. Her postoperative course was essentially unremarkable. She did well and was nursed in the intensive care unit overnight. On the second postoperative day, she was transferred to the floor, and she remained on the floor until the sixth postoperative day where she was discharged. Her chest tube had been removed the day before, and her wounds were healing as expected.   DISCHARGE MEDICATIONS: Included Effexor 75 mg twice a day, AcipHex 20 mg twice a day, alprazolam 0.5 mg 3 times a day, Centrum Silver once a day, hydrochlorothiazide 25 mg once a day, Claritin 10 mg once a day, Spiriva once a day and acetaminophen/hydrocodone 325/5 mg every 4 hours as needed for pain.   DISCHARGE INSTRUCTIONS: She was instructed to return to the cancer center later this week for further followup.   ____________________________ Lew Dawes. Genevive Bi, MD teo:gb D: 06/01/2013 15:16:26 ET T: 06/01/2013 21:14:57 ET JOB#: [id number]  cc: Christia Reading E. Genevive Bi, MD, <Dictator> Louis Matte MD ELECTRONICALLY SIGNED 06/02/2013 11:02

## 2015-04-03 NOTE — Op Note (Signed)
PATIENT NAME:  Julie Jennings, Julie Jennings MR#:  [id number] DATE OF BIRTH:  1951-06-12  DATE OF PROCEDURE:  01/22/2012  PREOPERATIVE DIAGNOSIS: Ventral hernia.   POSTOPERATIVE DIAGNOSIS: Ventral hernia.   PROCEDURE: Ventral hernia repair.   SURGEON: Loreli Dollar, MD  ASSISTANT: Britta Mccreedy PA   ANESTHESIA: General.   INDICATIONS: This 64 year old female has history of bulging in the epigastrium. She had had previous laparotomy for a biopsy of lymphoma recently with increasing bulging, moderate discomfort. A ventral hernia was demonstrated to the right of the midline in the epigastrium. The bulge was some 5 to 6 cm in dimension. Fascial ring defect some 2.5 to 3 cm in dimension. Fascial ring defect was to the right of the midline and surgery was recommended for definitive treatment.   DESCRIPTION OF PROCEDURE: The patient was placed on the operating table in the supine position under general endotracheal anesthesia. The abdomen was prepared with ChloraPrep and was draped in a sterile manner.   Upper abdominal midline incision was made which was approximately 5 cm in length, carried down through subcutaneous tissues directed somewhat towards the right and encountered a ventral hernia. The sac was dissected free from surrounding structures and reduced. The fascial ring defect was approximately 3 cm in dimension. The properitoneal fat was pushed away from the fascia circumferentially and also found another smaller defect just about 3 cm cephalad to this and also a small umbilical defect about 3 cm distal to this. Next, an Atrium mesh was cut out to create a somewhat rectangular shape which was approximately 4 x 12 cm placed into the properitoneal plane. It was sutured to the fascia below the umbilicus with interrupted 0 Surgilon sutures. Also was sutured to the fascia above the uppermost defect with 0 Surgilon then it was sutured bilaterally to the fascia with interrupted 0 Surgilon sutures. Next, the fascial  ring defect itself was closed with a transversely oriented suture line of interrupted 0 Surgilon which was also incorporated into the mesh as well. It is noted that during the course of the procedure numerous small bleeding points were cauterized. Hemostasis was subsequently intact. The subcutaneous tissues were approximated with 4-0 Monocryl and the skin was closed with a running 4-0 Monocryl subcuticular suture and Dermabond. The patient tolerated surgery satisfactorily and is now being prepared for transfer to the recovery room.   ____________________________ Lenna Sciara. Rochel Brome, MD jws:cms D: 01/22/2012 10:40:50 ET T: 01/22/2012 10:58:44 ET JOB#: [id number]  cc: Loreli Dollar, MD, <Dictator> Loreli Dollar MD ELECTRONICALLY SIGNED 01/26/2012 12:34

## 2015-04-03 NOTE — Op Note (Signed)
PATIENT NAME:  Julie Jennings, Julie Jennings MR#:  [id number] DATE OF BIRTH:  03/27/1951  DATE OF PROCEDURE:  12/24/2011  PREOPERATIVE DIAGNOSIS:  Hemorrhoids.   POSTOPERATIVE DIAGNOSIS: Hemorrhoids.   PROCEDURE: Stapled internal hemorrhoidectomy.   SURGEON: Rochel Brome, MD.  ANESTHESIA: General.   INDICATION: This 64 year old female has a history of prolapsing internal hemorrhoids with bleeding. She also had external hemorrhoids which did not appear to be part of her symptoms and surgery was recommended for definitive treatment.   DESCRIPTION OF PROCEDURE: The patient was placed on the operating table in the supine position under general anesthesia. Legs were elevated into the lithotomy position using ankle straps. The anal area was prepared with Betadine solution and draped in a sterile manner. There were external hemorrhoids appreciated on exam. Digital exam demonstrated no palpable rectal mass. The anoderm and deeper tissues surrounding the sphincter were infiltrated with a total of 30 mL of 0.25% Marcaine with epinephrine. The anal canal was dilated large enough to admit three fingers. The bivalve anal retractor was introduced and further gently dilated the anal canal. No polyps or tumors were seen. Multiple large internal hemorrhoids were demonstrated. Next, the transparent anal retractor was introduced and was sutured to the surrounding anoderm with 3-0 nylon four-point fixation. Next, the side-viewing anoscope was introduced. A 2-0 Prolene pursestring suture was placed in the distal rectal mucosa at the upper extent of the internal hemorrhoids. The side-viewing anoscope was removed. The EEA PPH stapler was introduced and the pursestring was tied down. The stapler was engaged, held for 30 seconds, activated, held for 30 seconds, disengaged and removed. The ring of internal hemorrhoidal tissue was examined and was submitted for routine pathology. The staple line was inspected. Two small bleeding points were  cauterized. Hemostasis was subsequently intact. The transparent anal retractor was then removed. Hemostasis appeared to be intact. The area was cleaned with saline soaked towels. Dressings were applied using 4 x 4 gauze and 2 inch paper tape.   The patient tolerated surgery satisfactorily and was then prepared for transfer to the recovery room.  ____________________________ Lenna Sciara. Rochel Brome, MD jws:ap D: 12/24/2011 10:38:30 ET T: 12/24/2011 10:48:15 ET JOB#: [id number]  cc: Loreli Dollar, MD, <Dictator> Loreli Dollar MD ELECTRONICALLY SIGNED 01/13/2012 15:08

## 2015-06-20 ENCOUNTER — Telehealth: Payer: Self-pay | Admitting: *Deleted

## 2015-06-20 MED ORDER — LORATADINE 10 MG PO TABS: 10.0000 mg | ORAL_TABLET | Freq: Every day | ORAL | Status: DC

## 2015-06-20 NOTE — Telephone Encounter (Signed)
Escribed

## 2015-07-23 ENCOUNTER — Encounter: Payer: Self-pay | Admitting: Emergency Medicine

## 2015-07-23 ENCOUNTER — Emergency Department
Admission: EM | Admit: 2015-07-23 | Discharge: 2015-07-23 | Disposition: A | Discharge status: Home / Self Care | Payer: Commercial Managed Care - HMO | Attending: Emergency Medicine | Admitting: Emergency Medicine

## 2015-07-23 DIAGNOSIS — Z79899 Other long term (current) drug therapy: Secondary | ICD-10-CM | POA: Diagnosis not present

## 2015-07-23 DIAGNOSIS — S01312A Laceration without foreign body of left ear, initial encounter: Secondary | ICD-10-CM | POA: Insufficient documentation

## 2015-07-23 DIAGNOSIS — Y9289 Other specified places as the place of occurrence of the external cause: Secondary | ICD-10-CM | POA: Diagnosis not present

## 2015-07-23 DIAGNOSIS — Y998 Other external cause status: Secondary | ICD-10-CM | POA: Insufficient documentation

## 2015-07-23 DIAGNOSIS — Y9389 Activity, other specified: Secondary | ICD-10-CM | POA: Insufficient documentation

## 2015-07-23 DIAGNOSIS — Y288XXA Contact with other sharp object, undetermined intent, initial encounter: Secondary | ICD-10-CM | POA: Diagnosis not present

## 2015-07-23 HISTORY — DX: Non-Hodgkin lymphoma, unspecified, unspecified site: C85.90

## 2015-07-23 HISTORY — DX: Unspecified atrial fibrillation: I48.91

## 2015-07-23 HISTORY — DX: Malignant neoplasm of unspecified part of unspecified bronchus or lung: C34.90

## 2015-07-23 MED ORDER — LIDOCAINE HCL (PF) 1 % IJ SOLN
5.0000 mL | Freq: Once | INTRAMUSCULAR | Status: AC
  Administered 2015-07-23: 5 mL
  Filled 2015-07-23: qty 5
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
## 2015-07-23 NOTE — ED Notes (Signed)
Pt states that earring fell out of left ear and noticed that ear was bleeding. Pt's ear lobe noted to be separated at bottom.

## 2015-07-23 NOTE — ED Provider Notes (Signed)
[facility] Emergency Department Provider Note  ____________________________________________  Time seen: Approximately 12:25 PM  I have reviewed the triage vital signs and the nursing notes.   HISTORY  Chief Complaint Ear Laceration   HPI Julie Jennings is a 64 y.o. female presents to the emergency department for evaluation of a right ear lobe laceration that occurred around 8 PM last night. She states that she was wearing heavy earrings and while she was driving it pulled through her earring hole. She states that what she arrived home she cleaned it with peroxide and water and covered it.   Past Medical History  Diagnosis Date  . Lung cancer left  . Lymphoma     "stomach"  . A-fib     Only once    There are no active problems to display for this patient.   Past Surgical History  Procedure Laterality Date  . Lung removal, partial Left   . Dilation and curettage of uterus      Current Outpatient Rx  Name  Route  Sig  Dispense  Refill  . loratadine (CLARITIN) 10 MG tablet   Oral   Take 1 tablet (10 mg total) by mouth daily.   90 tablet   0     Allergies Codeine  No family history on file.  Social History Social History  Substance Use Topics  . Smoking status: Never Smoker   . Smokeless tobacco: None  . Alcohol Use: No    Review of Systems   Constitutional: No fever/chills Eyes: No visual changes. ENT: No congestion or rhinorrhea Cardiovascular: Denies chest pain. Respiratory: Denies shortness of breath. Gastrointestinal: No abdominal pain.  No nausea, no vomiting.  No diarrhea.  No constipation. Genitourinary: Negative for dysuria. Musculoskeletal: Negative for back pain. Skin: Earlobe laceration--left  Neurological: Negative for headaches, focal weakness or numbness.  10-point ROS otherwise negative.  ____________________________________________   PHYSICAL EXAM:  VITAL SIGNS: ED Triage Vitals  Enc Vitals Group   BP 07/23/15 1201 152/83 mmHg     Pulse Rate 07/23/15 1201 82     Resp 07/23/15 1201 18     Temp 07/23/15 1201 98 F (36.7 C)     Temp Source 07/23/15 1201 Oral     SpO2 07/23/15 1201 96 %     Weight 07/23/15 1201 193 lb (87.544 kg)     Height 07/23/15 1201 '5\' 7"'$  (1.702 m)     Head Cir --      Peak Flow --      Pain Score 07/23/15 1201 0     Pain Loc --      Pain Edu? --      Excl. in [place]? --     Constitutional: Alert and oriented. Well appearing and in no acute distress. Eyes: Conjunctivae are normal. PERRL. EOMI. Head: Atraumatic. Nose: No congestion/rhinnorhea. Mouth/Throat: Mucous membranes are moist.  Oropharynx non-erythematous. No oral lesions. Neck: No stridor. Cardiovascular: Normal rate, regular rhythm.  Good peripheral circulation. Respiratory: Normal respiratory effort.  No retractions. Lungs CTAB. Gastrointestinal: Soft and nontender. No distention. No abdominal bruits.  Musculoskeletal: No lower extremity tenderness nor edema.  No joint effusions. Neurologic:  Normal speech and language. No gross focal neurologic deficits are appreciated. Speech is normal. No gait instability. Skin:  Laceration through the apex of the earlobe.  Psychiatric: Mood and affect are normal. Speech and behavior are normal.  ____________________________________________   LABS (all labs ordered are listed, but only abnormal results are displayed)  Labs Reviewed -  No data to display ____________________________________________  EKG   ____________________________________________  [id number]   ____________________________________________   PROCEDURES  Procedure(s) performed:  LACERATION REPAIR Performed by: Sherrie George Authorized by: Sherrie George Consent: Verbal consent obtained. Risks and benefits: risks, benefits and alternatives were discussed--Patient was advised of risk of failed closure or malalignment. She was advised not to re-pierce the ear until she sees Dr.  Kathyrn Sheriff.  Consent given by: patient Patient identity confirmed: provided demographic data Prepped and Draped in normal sterile fashion Wound explored  Laceration Location: Left earlobe  Laceration Length:2cm  No Foreign Bodies seen or palpated  Anesthesia: Ear block  Local anesthetic: lidocaine 1% without epinephrine  Anesthetic total: 4 ml  Irrigation method: syringe Amount of cleaning: standard  Skin closure: 6-0 vicryl; 6-0 prolene  Number of sutures: 2 internal; 6 external  Technique: simple interrupted  Patient tolerance: Patient tolerated the procedure well with no immediate complications.  Appears to have realigned well.  ____________________________________________   INITIAL IMPRESSION / ASSESSMENT AND PLAN / ED COURSE  Pertinent labs & imaging results that were available during my care of the patient were reviewed by me and considered in my medical decision making (see chart for details).  This is a patient of Dr. Kathyrn Sheriff. She will schedule a follow up with him in 7 days to evaluate if sutures can be removed and discuss reconstruction if necessary.   She was advised to return to the ER for symptoms of concern if she is unable to schedule an appointment.  ____________________________________________   FINAL CLINICAL IMPRESSION(S) / ED DIAGNOSES  Final diagnoses:  Laceration of earlobe, left, initial encounter       Victorino Dike, FNP 07/23/15 1615  Lavonia Drafts, MD 07/25/15 [phone]

## 2015-09-09 ENCOUNTER — Other Ambulatory Visit: Payer: Self-pay | Admitting: *Deleted

## 2015-09-09 DIAGNOSIS — C8590 Non-Hodgkin lymphoma, unspecified, unspecified site: Secondary | ICD-10-CM

## 2015-09-13 ENCOUNTER — Encounter: Payer: Self-pay | Admitting: Oncology

## 2015-09-13 ENCOUNTER — Inpatient Hospital Stay (HOSPITAL_BASED_OUTPATIENT_CLINIC_OR_DEPARTMENT_OTHER): Payer: Commercial Managed Care - HMO | Admitting: Oncology

## 2015-09-13 ENCOUNTER — Inpatient Hospital Stay: Payer: Commercial Managed Care - HMO | Attending: Oncology

## 2015-09-13 VITALS — BP 130/88 | HR 71 | Temp 96.3°F | Wt 203.3 lb

## 2015-09-13 DIAGNOSIS — I10 Essential (primary) hypertension: Secondary | ICD-10-CM | POA: Insufficient documentation

## 2015-09-13 DIAGNOSIS — I4891 Unspecified atrial fibrillation: Secondary | ICD-10-CM | POA: Diagnosis not present

## 2015-09-13 DIAGNOSIS — K760 Fatty (change of) liver, not elsewhere classified: Secondary | ICD-10-CM | POA: Insufficient documentation

## 2015-09-13 DIAGNOSIS — C8590 Non-Hodgkin lymphoma, unspecified, unspecified site: Secondary | ICD-10-CM

## 2015-09-13 DIAGNOSIS — I639 Cerebral infarction, unspecified: Secondary | ICD-10-CM | POA: Insufficient documentation

## 2015-09-13 DIAGNOSIS — I471 Supraventricular tachycardia: Secondary | ICD-10-CM | POA: Insufficient documentation

## 2015-09-13 DIAGNOSIS — N809 Endometriosis, unspecified: Secondary | ICD-10-CM | POA: Insufficient documentation

## 2015-09-13 DIAGNOSIS — R748 Abnormal levels of other serum enzymes: Secondary | ICD-10-CM | POA: Insufficient documentation

## 2015-09-13 DIAGNOSIS — Z7982 Long term (current) use of aspirin: Secondary | ICD-10-CM | POA: Diagnosis not present

## 2015-09-13 DIAGNOSIS — Z8512 Personal history of malignant neoplasm of trachea: Secondary | ICD-10-CM | POA: Diagnosis not present

## 2015-09-13 DIAGNOSIS — Z85118 Personal history of other malignant neoplasm of bronchus and lung: Secondary | ICD-10-CM

## 2015-09-13 DIAGNOSIS — Z23 Encounter for immunization: Secondary | ICD-10-CM | POA: Diagnosis not present

## 2015-09-13 DIAGNOSIS — Z79899 Other long term (current) drug therapy: Secondary | ICD-10-CM

## 2015-09-13 DIAGNOSIS — J309 Allergic rhinitis, unspecified: Secondary | ICD-10-CM | POA: Insufficient documentation

## 2015-09-13 DIAGNOSIS — K589 Irritable bowel syndrome without diarrhea: Secondary | ICD-10-CM | POA: Insufficient documentation

## 2015-09-13 DIAGNOSIS — Z780 Asymptomatic menopausal state: Secondary | ICD-10-CM | POA: Insufficient documentation

## 2015-09-13 DIAGNOSIS — N200 Calculus of kidney: Secondary | ICD-10-CM | POA: Insufficient documentation

## 2015-09-13 DIAGNOSIS — F329 Major depressive disorder, single episode, unspecified: Secondary | ICD-10-CM | POA: Insufficient documentation

## 2015-09-13 DIAGNOSIS — R002 Palpitations: Secondary | ICD-10-CM | POA: Insufficient documentation

## 2015-09-13 DIAGNOSIS — M1990 Unspecified osteoarthritis, unspecified site: Secondary | ICD-10-CM | POA: Insufficient documentation

## 2015-09-13 DIAGNOSIS — F32A Depression, unspecified: Secondary | ICD-10-CM | POA: Insufficient documentation

## 2015-09-13 LAB — CBC WITH DIFFERENTIAL/PLATELET
BASOS PCT: 1 %
Basophils Absolute: 0.1 10*3/uL (ref 0–0.1)
EOS ABS: 0.2 10*3/uL (ref 0–0.7)
EOS PCT: 3 %
HCT: 42.5 % (ref 35.0–47.0)
Hemoglobin: 14.4 g/dL (ref 12.0–16.0)
LYMPHS ABS: 1.8 10*3/uL (ref 1.0–3.6)
Lymphocytes Relative: 28 %
MCH: 31.5 pg (ref 26.0–34.0)
MCHC: 33.8 g/dL (ref 32.0–36.0)
MCV: 93.2 fL (ref 80.0–100.0)
Monocytes Absolute: 0.5 10*3/uL (ref 0.2–0.9)
Monocytes Relative: 8 %
Neutro Abs: 3.8 10*3/uL (ref 1.4–6.5)
Neutrophils Relative %: 60 %
PLATELETS: 206 10*3/uL (ref 150–440)
RBC: 4.56 MIL/uL (ref 3.80–5.20)
RDW: 13.4 % (ref 11.5–14.5)
WBC: 6.3 10*3/uL (ref 3.6–11.0)

## 2015-09-13 LAB — COMPREHENSIVE METABOLIC PANEL
ALT: 85 U/L — ABNORMAL HIGH (ref 14–54)
AST: 80 U/L — ABNORMAL HIGH (ref 15–41)
Albumin: 4.3 g/dL (ref 3.5–5.0)
Alkaline Phosphatase: 154 U/L — ABNORMAL HIGH (ref 38–126)
Anion gap: 8 (ref 5–15)
BUN: 20 mg/dL (ref 6–20)
CHLORIDE: 102 mmol/L (ref 101–111)
CO2: 29 mmol/L (ref 22–32)
CREATININE: 0.66 mg/dL (ref 0.44–1.00)
Calcium: 9 mg/dL (ref 8.9–10.3)
GFR calc Af Amer: >60 mL/min (ref >60)
GFR calc non Af Amer: >60 mL/min (ref >60)
Glucose, Bld: 115 mg/dL — ABNORMAL HIGH (ref 65–99)
Potassium: 4.5 mmol/L (ref 3.5–5.1)
SODIUM: 139 mmol/L (ref 135–145)
Total Bilirubin: 0.8 mg/dL (ref 0.3–1.2)
Total Protein: 7.7 g/dL (ref 6.5–8.1)

## 2015-09-13 LAB — LACTATE DEHYDROGENASE: LDH: 195 U/L — ABNORMAL HIGH (ref 98–192)

## 2015-09-13 MED ORDER — INFLUENZA VAC SPLIT QUAD 0.5 ML IM SUSY
0.5000 mL | PREFILLED_SYRINGE | Freq: Once | INTRAMUSCULAR | Status: AC
  Administered 2015-09-13: 0.5 mL via INTRAMUSCULAR

## 2015-09-13 NOTE — Progress Notes (Signed)
[place] @ [facility] [phone]  Fax:(336) [place] OB: 11/12/1951  MR#: [id number]  WVP#:[id number]  Patient Care Team: Idelle Crouch, MD as PCP - General (Internal Medicine)  CHIEF COMPLAINT:  Chief Complaint  Patient presents with  . OTHER   Chief Complaint/Diagnosis:   Primary Care Physician:  Dr. Debby Bud Jennings  Dr. 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 (2) abnormal liver enzymes. Biopsy is suggestive of fatty liver changes 3.carcinoma of lung status post left upper lobe resection in July of 2014 Adenocarcinoma T1N0 M0 tumor EGFR positive   INTERVAL HISTORY:   64 year old lady with a history of carcinoma of lung diagnosis in July of 2014 EGFR positive tumor.  Patient does not smoke.  Has shortness of breath on exertion.  But no cough or chest pain. Patient also had diffuse be last cell lymphoma several years ago Continues to gain weight As some tingling numbness in lower extremity Patient also had abnormal liver enzymes which has been previously investigated and was secondary to fatty liver  Here for further follow-up and treatment consideration September 13, 2015 Patient is here for ongoing evaluation and consideration for further treatment option.  Julie Jennings Diffuse B large cell cell lymphoma Carcinoma of lung EGFR mutated status post resection  REVIEW OF SYSTEMS:   GENERAL:  Feels good.  Active.  No fevers, sweats or weight loss. PERFORMANCE STATUS (ECOG): 01 HEENT:  No visual changes, runny nose, sore throat, mouth sores or tenderness. Lungs: No shortness of breath or cough.  No hemoptysis. Cardiac:  No chest pain, palpitations, orthopnea, or PND. GI:  No nausea, vomiting, diarrhea, constipation, melena or hematochezia. GU:  No urgency, frequency, dysuria, or hematuria. Musculoskeletal:  No back pain.  No joint pain.  No muscle tenderness. Extremities:  No pain or  swelling. Skin:  No rashes or skin changes. Neuro:  No headache, numbness or weakness, balance or coordination issues. Endocrine:  No diabetes, thyroid issues, hot flashes or night sweats. Psych:  No mood changes, depression or anxiety. Pain:  No focal pain. Review of systems:  All other systems reviewed and found to be negative. As per HPI. Otherwise, a complete review of systems is negatve.  PAST MEDICAL HISTORY: Past Medical History  Diagnosis Date  . Lung cancer ([place]) left  . Lymphoma ([place])     "stomach"  . A-fib ([place])     Only once  . BP (high blood pressure) 04/16/2014    PAST SURGICAL HISTORY: Past Surgical History  Procedure Laterality Date  . Lung removal, partial Left   . Dilation and curettage of uterus      FAMILY HISTORY No family history on file.  ADVANCED DIRECTIVES:  No flowsheet data found.  HEALTH MAINTENANCE: Social History  Substance Use Topics  . Smoking status: Never Smoker   . Smokeless tobacco: None  . Alcohol Use: No      Allergies  Allergen Reactions  . Buspirone Nausea And Vomiting  . Codeine Nausea And Vomiting    Current Outpatient Prescriptions  Medication Sig Dispense Refill  . ALPRAZolam (XANAX) 0.5 MG tablet Take by mouth.    Marland Kitchen aspirin EC 325 MG tablet Take by mouth.    . celecoxib (CELEBREX) 200 MG capsule Take by mouth.    . Cholecalciferol (D 2000) 2000 UNITS TABS Take by mouth.    . furosemide (LASIX) 40 MG tablet Take by mouth.    . loratadine (  CLARITIN) 10 MG tablet Take 1 tablet (10 mg total) by mouth daily. 90 tablet 0  . losartan (COZAAR) 50 MG tablet Take by mouth.    . nebivolol (BYSTOLIC) 10 MG tablet Take by mouth.    . RABEprazole (ACIPHEX) 20 MG tablet Take by mouth.    . tiotropium (SPIRIVA) 18 MCG inhalation capsule Place into inhaler and inhale.    . venlafaxine XR (EFFEXOR-XR) 75 MG 24 hr capsule Take by mouth.     No current facility-administered medications for this visit.   Allergies:  Codeine:  Other  Lovastatin: Itching, Hives  Buspar: Unknown  Significant History/PMH:   Gastric Reflux:    Hypertension:    Arrythmias:    Palpitations:    HX: IBS:    HX: Hypoglycemia:    Degenerative Disc Disease:    Kidney Stones:    Anxiety:    abd lymphoma:    Depression:    Osteoporosis:    Hyperlipidemia:    Hypertension:    Dilation and Curretage:    Cholecystectomy:    Bilateral Breast Implants:    Stapled Hemorroidectomy:    Detached Retina:    Laparotomy:   Smoking History: Smoking History Never Smoked.(1)  PFSH: Additional Past Medical and Surgical History: Past Medical History:  Hemorrhoids  Osteoporosis  Irritable bowel syndrome  Patient had frequent colonoscopies because of polyps  Has abnormal liver enzymes    Past Surgical History:  Gallbladder surgery    Family History:  Father had colon cancer. No family history of breast cancer. History of diabetes and heart disease in the family.    Social History:  Does not smoke. Does not drink.   OBJECTIVE:  Filed Vitals:   09/13/15 0919  BP: 130/88  Pulse: 71  Temp: 96.3 F (35.7 C)     Body mass index is 31.83 kg/(m^2).    ECOG FS:1 - Symptomatic but completely ambulatory  PHYSICAL EXAM: General  status: Performance status is good.  Patient has not lost significant weight.  Moderately obese lady Moderately obese with BMI of 31.83 Since last evaluation there is no significant change in the general status HEENT: No evidence of stomatitis. Sclera and conjunctivae :: No jaundice.   pale looking. Lungs: Air  entry equal on both sides.  No rhonchi.  No rales.  Cardiac: Heart sounds are normal.  No pericardial rub.  No murmur. Lymphatic system: Cervical, axillary, inguinal, lymph nodes not palpable GI: Abdomen is soft.liver and spleen not palpable.  No ascites.  Bowel sounds are normal.  No other palpable masses.  No tenderness . Lower extremity: No edema Neurological system: Higher  functions, cranial nerves intact no evidence of peripheral neuropathy. Skin: No rash.  No ecchymosis.. No petechial hemorrhages   LAB RESULTS:  CBC Latest Ref Rng 09/13/2015 03/14/2015  WBC 3.6 - 11.0 K/uL 6.3 7.5  Hemoglobin 12.0 - 16.0 g/dL 14.4 14.4  Hematocrit 35.0 - 47.0 % 42.5 41.6  Platelets 150 - 440 K/uL 206 189    Appointment on 09/13/2015  Component Date Value Ref Range Status  . WBC 09/13/2015 6.3  3.6 - 11.0 K/uL Final  . RBC 09/13/2015 4.56  3.80 - 5.20 MIL/uL Final  . Hemoglobin 09/13/2015 14.4  12.0 - 16.0 g/dL Final  . HCT 09/13/2015 42.5  35.0 - 47.0 % Final  . MCV 09/13/2015 93.2  80.0 - 100.0 fL Final  . MCH 09/13/2015 31.5  26.0 - 34.0 pg Final  . MCHC 09/13/2015 33.8  32.0 - 36.0  g/dL Final  . RDW 09/13/2015 13.4  11.5 - 14.5 % Final  . Platelets 09/13/2015 206  150 - 440 K/uL Final  . Neutrophils Relative % 09/13/2015 60   Final  . Neutro Abs 09/13/2015 3.8  1.4 - 6.5 K/uL Final  . Lymphocytes Relative 09/13/2015 28   Final  . Lymphs Abs 09/13/2015 1.8  1.0 - 3.6 K/uL Final  . Monocytes Relative 09/13/2015 8   Final  . Monocytes Absolute 09/13/2015 0.5  0.2 - 0.9 K/uL Final  . Eosinophils Relative 09/13/2015 3   Final  . Eosinophils Absolute 09/13/2015 0.2  0 - 0.7 K/uL Final  . Basophils Relative 09/13/2015 1   Final  . Basophils Absolute 09/13/2015 0.1  0 - 0.1 K/uL Final  . Sodium 09/13/2015 139  135 - 145 mmol/L Final  . Potassium 09/13/2015 4.5  3.5 - 5.1 mmol/L Final  . Chloride 09/13/2015 102  101 - 111 mmol/L Final  . CO2 09/13/2015 29  22 - 32 mmol/L Final  . Glucose, Bld 09/13/2015 115* 65 - 99 mg/dL Final  . BUN 09/13/2015 20  6 - 20 mg/dL Final  . Creatinine, Ser 09/13/2015 0.66  0.44 - 1.00 mg/dL Final  . Calcium 09/13/2015 9.0  8.9 - 10.3 mg/dL Final  . Total Protein 09/13/2015 7.7  6.5 - 8.1 g/dL Final  . Albumin 09/13/2015 4.3  3.5 - 5.0 g/dL Final  . AST 09/13/2015 80* 15 - 41 U/L Final  . ALT 09/13/2015 85* 14 - 54 U/L Final  .  Alkaline Phosphatase 09/13/2015 154* 38 - 126 U/L Final  . Total Bilirubin 09/13/2015 0.8  0.3 - 1.2 mg/dL Final  . GFR calc non Af Amer 09/13/2015 >60  >60 mL/min Final  . GFR calc Af Amer 09/13/2015 >60  >60 mL/min Final   Comment: (NOTE) The eGFR has been calculated using the CKD EPI equation. This calculation has not been validated in all clinical situations. eGFR's persistently <60 mL/min signify possible Chronic Kidney Disease.   . Anion gap 09/13/2015 8  5 - 15 Final  . LDH 09/13/2015 195* 98 - 192 U/L Final       ASSESSMENT: 1. CANCER  of lung stage I disease we will do surveillance CT scan of the chest which has been ordered and will be reviewed if it is abnormal we will call patient 2, screening mammogram has been ordered as it has not been done since June of 2015 3.  Diffuse be last cell lymphoma there is no evidence of recurrent disease LDH is slightly elevated 4.  Abnormal liver enzymes Previously had evaluation and was considered due to fatty liver 5,   PATIENT   desires flu shot which will be given today    Patient expressed understanding and was in agreement with this plan. She also understands that She can call clinic at any time with any questions, concerns, or complaints.    No matching staging information was found for the patient.  Julie Gleason, MD   09/13/2015 9:37 AM

## 2015-09-13 NOTE — Progress Notes (Signed)
Patient does not have living will.  Never smoked.

## 2015-09-23 ENCOUNTER — Ambulatory Visit: Payer: Commercial Managed Care - HMO

## 2015-09-29 ENCOUNTER — Ambulatory Visit: Payer: Commercial Managed Care - HMO

## 2015-09-30 ENCOUNTER — Ambulatory Visit
Admission: RE | Admit: 2015-09-30 | Discharge: 2015-09-30 | Disposition: A | Discharge status: Home / Self Care | Payer: Commercial Managed Care - HMO | Source: Ambulatory Visit | Attending: Oncology | Admitting: Oncology

## 2015-09-30 DIAGNOSIS — C8590 Non-Hodgkin lymphoma, unspecified, unspecified site: Secondary | ICD-10-CM | POA: Insufficient documentation

## 2015-09-30 DIAGNOSIS — Z902 Acquired absence of lung [part of]: Secondary | ICD-10-CM | POA: Insufficient documentation

## 2015-09-30 MED ORDER — IOHEXOL 300 MG/ML  SOLN
75.0000 mL | Freq: Once | INTRAMUSCULAR | Status: AC | PRN
  Administered 2015-09-30: 75 mL via INTRAVENOUS

## 2015-10-12 ENCOUNTER — Encounter: Payer: Self-pay | Admitting: Oncology

## 2015-12-23 ENCOUNTER — Telehealth: Payer: Self-pay | Admitting: *Deleted

## 2015-12-23 NOTE — Telephone Encounter (Signed)
Deferred to Dr Doy Hutching for refill

## 2016-01-02 ENCOUNTER — Ambulatory Visit: Payer: Commercial Managed Care - HMO

## 2016-03-13 ENCOUNTER — Other Ambulatory Visit: Payer: Commercial Managed Care - HMO

## 2016-03-13 ENCOUNTER — Ambulatory Visit: Payer: Commercial Managed Care - HMO | Admitting: Oncology

## 2016-03-20 ENCOUNTER — Other Ambulatory Visit: Payer: Self-pay | Admitting: Oncology

## 2016-03-20 ENCOUNTER — Ambulatory Visit
Admission: RE | Admit: 2016-03-20 | Discharge: 2016-03-20 | Disposition: A | Discharge status: Home / Self Care | Payer: Medicare Other | Source: Ambulatory Visit | Attending: Oncology | Admitting: Oncology

## 2016-03-20 DIAGNOSIS — Z1231 Encounter for screening mammogram for malignant neoplasm of breast: Secondary | ICD-10-CM

## 2016-03-20 DIAGNOSIS — C8590 Non-Hodgkin lymphoma, unspecified, unspecified site: Secondary | ICD-10-CM

## 2016-03-27 ENCOUNTER — Telehealth: Payer: Self-pay | Admitting: *Deleted

## 2016-03-27 NOTE — Telephone Encounter (Signed)
Patient would like to know mammogram results.

## 2016-03-27 NOTE — Telephone Encounter (Signed)
Mammogram was normal.   

## 2016-03-27 NOTE — Telephone Encounter (Signed)
Called patient to inform her mammogram was normal.  Patient verbalized understanding.

## 2016-03-29 ENCOUNTER — Other Ambulatory Visit: Payer: Commercial Managed Care - HMO

## 2016-03-29 ENCOUNTER — Ambulatory Visit: Payer: Commercial Managed Care - HMO | Admitting: Oncology

## 2016-05-03 ENCOUNTER — Inpatient Hospital Stay: Payer: Medicare Other | Attending: Oncology

## 2016-05-03 ENCOUNTER — Inpatient Hospital Stay (HOSPITAL_BASED_OUTPATIENT_CLINIC_OR_DEPARTMENT_OTHER): Payer: Medicare Other | Admitting: Oncology

## 2016-05-03 ENCOUNTER — Encounter: Payer: Self-pay | Admitting: Oncology

## 2016-05-03 VITALS — BP 107/75 | HR 76 | Temp 97.8°F | Resp 18 | Wt 202.8 lb

## 2016-05-03 DIAGNOSIS — K760 Fatty (change of) liver, not elsewhere classified: Secondary | ICD-10-CM | POA: Insufficient documentation

## 2016-05-03 DIAGNOSIS — I4891 Unspecified atrial fibrillation: Secondary | ICD-10-CM | POA: Insufficient documentation

## 2016-05-03 DIAGNOSIS — R748 Abnormal levels of other serum enzymes: Secondary | ICD-10-CM

## 2016-05-03 DIAGNOSIS — C3492 Malignant neoplasm of unspecified part of left bronchus or lung: Secondary | ICD-10-CM

## 2016-05-03 DIAGNOSIS — Z8572 Personal history of non-Hodgkin lymphomas: Secondary | ICD-10-CM

## 2016-05-03 DIAGNOSIS — Z79899 Other long term (current) drug therapy: Secondary | ICD-10-CM | POA: Diagnosis not present

## 2016-05-03 DIAGNOSIS — C3490 Malignant neoplasm of unspecified part of unspecified bronchus or lung: Secondary | ICD-10-CM

## 2016-05-03 DIAGNOSIS — C8590 Non-Hodgkin lymphoma, unspecified, unspecified site: Secondary | ICD-10-CM

## 2016-05-03 DIAGNOSIS — I10 Essential (primary) hypertension: Secondary | ICD-10-CM | POA: Diagnosis not present

## 2016-05-03 LAB — COMPREHENSIVE METABOLIC PANEL
ALT: 85 U/L — AB (ref 14–54)
ANION GAP: 5 (ref 5–15)
AST: 87 U/L — AB (ref 15–41)
Albumin: 4.1 g/dL (ref 3.5–5.0)
Alkaline Phosphatase: 141 U/L — ABNORMAL HIGH (ref 38–126)
BUN: 14 mg/dL (ref 6–20)
CALCIUM: 9 mg/dL (ref 8.9–10.3)
CO2: 29 mmol/L (ref 22–32)
Chloride: 106 mmol/L (ref 101–111)
Creatinine, Ser: 0.64 mg/dL (ref 0.44–1.00)
GLUCOSE: 109 mg/dL — AB (ref 65–99)
Potassium: 4.2 mmol/L (ref 3.5–5.1)
SODIUM: 140 mmol/L (ref 135–145)
TOTAL PROTEIN: 7.5 g/dL (ref 6.5–8.1)
Total Bilirubin: 0.9 mg/dL (ref 0.3–1.2)

## 2016-05-03 LAB — CBC WITH DIFFERENTIAL/PLATELET
BASOS ABS: 0.1 10*3/uL (ref 0–0.1)
BASOS PCT: 1 %
EOS ABS: 0.1 10*3/uL (ref 0–0.7)
Eosinophils Relative: 2 %
HEMATOCRIT: 41.3 % (ref 35.0–47.0)
Hemoglobin: 14.5 g/dL (ref 12.0–16.0)
Lymphocytes Relative: 30 %
Lymphs Abs: 1.8 10*3/uL (ref 1.0–3.6)
MCH: 32.6 pg (ref 26.0–34.0)
MCHC: 35 g/dL (ref 32.0–36.0)
MCV: 93.1 fL (ref 80.0–100.0)
MONO ABS: 0.4 10*3/uL (ref 0.2–0.9)
Monocytes Relative: 7 %
NEUTROS ABS: 3.5 10*3/uL (ref 1.4–6.5)
NEUTROS PCT: 60 %
PLATELETS: 189 10*3/uL (ref 150–440)
RBC: 4.44 MIL/uL (ref 3.80–5.20)
RDW: 13.7 % (ref 11.5–14.5)
WBC: 5.9 10*3/uL (ref 3.6–11.0)

## 2016-05-03 NOTE — Progress Notes (Signed)
[place] @ [facility] Telephone:([phone]) [phone]  Fax:(336) [place] OB: September 25, 1951  MR#: [id number]  JYN#:[id number]  Patient Care Team: Idelle Crouch, MD as PCP - General (Internal Medicine)  CHIEF COMPLAINT:  Chief Complaint  Patient presents with  . Lymphoma   Chief Complaint/Diagnosis:   Primary Care Physician:  Dr. Debby Bud Dalen  Dr. 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 (2) abnormal liver enzymes. Biopsy is suggestive of fatty liver changes 3.carcinoma of lung status post left upper lobe resection in July of 2014 Adenocarcinoma T1N0 M0 tumor EGFR positive   INTERVAL HISTORY:   65 year old lady with a history of carcinoma of lung diagnosis in July of 2014 EGFR positive tumor.  Patient does not smoke.  Has shortness of breath on exertion.  But no cough or chest pain. Patient also had diffuse be last cell lymphoma several years ago Continues to gain weight As some tingling numbness in lower extremity Patient also had abnormal liver enzymes which has been previously investigated and was secondary to fatty liver  Patient is here for further follow-up.  Liver enzymes are still abnormal but stable.  Getting regular mammograms done.  REVIEW OF SYSTEMS:   GENERAL:  Feels good.  Active.  No fevers, sweats or weight loss. PERFORMANCE STATUS (ECOG): 01 HEENT:  No visual changes, runny nose, sore throat, mouth sores or tenderness. Lungs: No shortness of breath or cough.  No hemoptysis. Cardiac:  No chest pain, palpitations, orthopnea, or PND. GI:  No nausea, vomiting, diarrhea, constipation, melena or hematochezia. GU:  No urgency, frequency, dysuria, or hematuria. Musculoskeletal:  No back pain.  No joint pain.  No muscle tenderness. Extremities:  No pain or swelling. Skin:  No rashes or skin changes. Neuro:  No headache, numbness or weakness, balance or coordination issues. Endocrine:  No diabetes,  thyroid issues, hot flashes or night sweats. Psych:  No mood changes, depression or anxiety. Pain:  No focal pain. Review of systems:  All other systems reviewed and found to be negative. As per HPI. Otherwise, a complete review of systems is negatve.  PAST MEDICAL HISTORY: Past Medical History  Diagnosis Date  . A-fib ([place])     Only once  . BP (high blood pressure) 04/16/2014  . Lung cancer ([place]) left  . Lymphoma ([place])     "stomach"    PAST SURGICAL HISTORY: Past Surgical History  Procedure Laterality Date  . Lung removal, partial Left   . Dilation and curettage of uterus    . Augmentation mammaplasty Bilateral     FAMILY HISTORY No family history on file.  ADVANCED DIRECTIVES:  No flowsheet data found.  HEALTH MAINTENANCE: Social History  Substance Use Topics  . Smoking status: Never Smoker   . Smokeless tobacco: None  . Alcohol Use: No      Allergies  Allergen Reactions  . Buspirone Nausea And Vomiting  . Codeine Nausea And Vomiting    Current Outpatient Prescriptions  Medication Sig Dispense Refill  . ALPRAZolam (XANAX) 0.5 MG tablet Take by mouth.    . celecoxib (CELEBREX) 200 MG capsule Take by mouth.    . Cholecalciferol (D 2000) 2000 UNITS TABS Take by mouth.    . nebivolol (BYSTOLIC) 10 MG tablet Take by mouth.    . RABEprazole (ACIPHEX) 20 MG tablet Take by mouth.    . tiotropium (SPIRIVA) 18 MCG inhalation capsule Place into inhaler and inhale.    Marland Kitchen  venlafaxine XR (EFFEXOR-XR) 75 MG 24 hr capsule Take by mouth.    . furosemide (LASIX) 40 MG tablet Take by mouth.    . furosemide (LASIX) 40 MG tablet     . losartan (COZAAR) 50 MG tablet Take by mouth.    . losartan (COZAAR) 50 MG tablet     . Multiple Vitamin (MULTI-VITAMINS) TABS Take by mouth.     No current facility-administered medications for this visit.   Allergies:  Codeine: Other  Lovastatin: Itching, Hives  Buspar: Unknown  Significant History/PMH:   Gastric Reflux:      Hypertension:    Arrythmias:    Palpitations:    HX: IBS:    HX: Hypoglycemia:    Degenerative Disc Disease:    Kidney Stones:    Anxiety:    abd lymphoma:    Depression:    Osteoporosis:    Hyperlipidemia:    Hypertension:    Dilation and Curretage:    Cholecystectomy:    Bilateral Breast Implants:    Stapled Hemorroidectomy:    Detached Retina:    Laparotomy:   Smoking History: Smoking History Never Smoked.(1)  PFSH: Additional Past Medical and Surgical History: Past Medical History:  Hemorrhoids  Osteoporosis  Irritable bowel syndrome  Patient had frequent colonoscopies because of polyps  Has abnormal liver enzymes    Past Surgical History:  Gallbladder surgery    Family History:  Father had colon cancer. No family history of breast cancer. History of diabetes and heart disease in the family.    Social History:  Does not smoke. Does not drink.   OBJECTIVE:  Filed Vitals:   05/03/16 0924  BP: 107/75  Pulse: 76  Temp: 97.8 F (36.6 C)  Resp: 18     Body mass index is 31.76 kg/(m^2).    ECOG FS:1 - Symptomatic but completely ambulatory  PHYSICAL EXAM: General  status: Performance status is good.  Patient has not lost significant weight.  Moderately obese lady Moderately obese with BMI of 31.83 Since last evaluation there is no significant change in the general status HEENT: No evidence of stomatitis. Sclera and conjunctivae :: No jaundice.   pale looking. Lungs: Air  entry equal on both sides.  No rhonchi.  No rales.  Cardiac: Heart sounds are normal.  No pericardial rub.  No murmur. Lymphatic system: Cervical, axillary, inguinal, lymph nodes not palpable GI: Abdomen is soft.liver and spleen not palpable.  No ascites.  Bowel sounds are normal.  No other palpable masses.  No tenderness . Lower extremity: No edema Neurological system: Higher functions, cranial nerves intact no evidence of peripheral neuropathy. Skin: No rash.  No  ecchymosis.. No petechial hemorrhages   LAB RESULTS:  CBC Latest Ref Rng 05/03/2016 09/13/2015  WBC 3.6 - 11.0 K/uL 5.9 6.3  Hemoglobin 12.0 - 16.0 g/dL 14.5 14.4  Hematocrit 35.0 - 47.0 % 41.3 42.5  Platelets 150 - 440 K/uL 189 206    Appointment on 05/03/2016  Component Date Value Ref Range Status  . WBC 05/03/2016 5.9  3.6 - 11.0 K/uL Final  . RBC 05/03/2016 4.44  3.80 - 5.20 MIL/uL Final  . Hemoglobin 05/03/2016 14.5  12.0 - 16.0 g/dL Final  . HCT 05/03/2016 41.3  35.0 - 47.0 % Final  . MCV 05/03/2016 93.1  80.0 - 100.0 fL Final  . MCH 05/03/2016 32.6  26.0 - 34.0 pg Final  . MCHC 05/03/2016 35.0  32.0 - 36.0 g/dL Final  . RDW 05/03/2016 13.7  11.5 -  14.5 % Final  . Platelets 05/03/2016 189  150 - 440 K/uL Final  . Neutrophils Relative % 05/03/2016 60   Final  . Neutro Abs 05/03/2016 3.5  1.4 - 6.5 K/uL Final  . Lymphocytes Relative 05/03/2016 30   Final  . Lymphs Abs 05/03/2016 1.8  1.0 - 3.6 K/uL Final  . Monocytes Relative 05/03/2016 7   Final  . Monocytes Absolute 05/03/2016 0.4  0.2 - 0.9 K/uL Final  . Eosinophils Relative 05/03/2016 2   Final  . Eosinophils Absolute 05/03/2016 0.1  0 - 0.7 K/uL Final  . Basophils Relative 05/03/2016 1   Final  . Basophils Absolute 05/03/2016 0.1  0 - 0.1 K/uL Final  . Sodium 05/03/2016 140  135 - 145 mmol/L Final  . Potassium 05/03/2016 4.2  3.5 - 5.1 mmol/L Final  . Chloride 05/03/2016 106  101 - 111 mmol/L Final  . CO2 05/03/2016 29  22 - 32 mmol/L Final  . Glucose, Bld 05/03/2016 109* 65 - 99 mg/dL Final  . BUN 05/03/2016 14  6 - 20 mg/dL Final  . Creatinine, Ser 05/03/2016 0.64  0.44 - 1.00 mg/dL Final  . Calcium 05/03/2016 9.0  8.9 - 10.3 mg/dL Final  . Total Protein 05/03/2016 7.5  6.5 - 8.1 g/dL Final  . Albumin 05/03/2016 4.1  3.5 - 5.0 g/dL Final  . AST 05/03/2016 87* 15 - 41 U/L Final  . ALT 05/03/2016 85* 14 - 54 U/L Final  . Alkaline Phosphatase 05/03/2016 141* 38 - 126 U/L Final  . Total Bilirubin 05/03/2016 0.9  0.3  - 1.2 mg/dL Final  . GFR calc non Af Amer 05/03/2016 >60  >60 mL/min Final  . GFR calc Af Amer 05/03/2016 >60  >60 mL/min Final   Comment: (NOTE) The eGFR has been calculated using the CKD EPI equation. This calculation has not been validated in all clinical situations. eGFR's persistently <60 mL/min signify possible Chronic Kidney Disease.   . Anion gap 05/03/2016 5  5 - 15 Final       ASSESSMENT: 1. CANCER  of lung stage I disease we will do surveillance CT scan of the chest which has been ordered and will be reviewed if it is abnormal we will call patient 2, screening mammogram has been ordered as it has not been done since June of 2015 3.  Diffuse be last cell lymphoma there is no evidence of recurrent disease LDH is slightly elevated 4.  Abnormal liver enzymes, stable.  Most likely due to fatty liver. Previously had evaluation and was considered due to fatty liver Repeat CT scan of the chest for follow-up regarding lung cancer in October of 2017     Patient expressed understanding and was in agreement with this plan. She also understands that She can call clinic at any time with any questions, concerns, or complaints.    No matching staging information was found for the patient.  Forest Gleason, MD   05/03/2016 9:31 AM

## 2016-09-17 ENCOUNTER — Other Ambulatory Visit: Payer: Self-pay

## 2016-09-17 ENCOUNTER — Ambulatory Visit
Admission: RE | Admit: 2016-09-17 | Discharge: 2016-09-17 | Disposition: A | Discharge status: Home / Self Care | Payer: Medicare Other | Source: Ambulatory Visit | Attending: Oncology | Admitting: Oncology

## 2016-09-17 DIAGNOSIS — Z902 Acquired absence of lung [part of]: Secondary | ICD-10-CM | POA: Insufficient documentation

## 2016-09-17 DIAGNOSIS — C3490 Malignant neoplasm of unspecified part of unspecified bronchus or lung: Secondary | ICD-10-CM

## 2016-09-17 DIAGNOSIS — C8590 Non-Hodgkin lymphoma, unspecified, unspecified site: Secondary | ICD-10-CM

## 2016-09-17 DIAGNOSIS — I700 Atherosclerosis of aorta: Secondary | ICD-10-CM | POA: Diagnosis not present

## 2016-09-17 LAB — POCT I-STAT CREATININE: CREATININE: 0.6 mg/dL (ref 0.44–1.00)

## 2016-09-17 MED ORDER — IOPAMIDOL (ISOVUE-300) INJECTION 61%
75.0000 mL | Freq: Once | INTRAVENOUS | Status: AC | PRN
  Administered 2016-09-17: 75 mL via INTRAVENOUS

## 2016-09-20 ENCOUNTER — Ambulatory Visit: Payer: Medicare Other

## 2016-09-24 ENCOUNTER — Inpatient Hospital Stay: Payer: Medicare Other | Admitting: Internal Medicine

## 2016-09-24 ENCOUNTER — Inpatient Hospital Stay: Payer: Medicare Other

## 2016-09-28 ENCOUNTER — Inpatient Hospital Stay: Payer: Medicare Other | Attending: Internal Medicine

## 2016-09-28 ENCOUNTER — Inpatient Hospital Stay (HOSPITAL_BASED_OUTPATIENT_CLINIC_OR_DEPARTMENT_OTHER): Payer: Medicare Other | Admitting: Internal Medicine

## 2016-09-28 VITALS — BP 129/80 | HR 71 | Temp 97.8°F | Resp 18 | Wt 203.3 lb

## 2016-09-28 DIAGNOSIS — Z8572 Personal history of non-Hodgkin lymphomas: Secondary | ICD-10-CM

## 2016-09-28 DIAGNOSIS — Z79899 Other long term (current) drug therapy: Secondary | ICD-10-CM

## 2016-09-28 DIAGNOSIS — I4891 Unspecified atrial fibrillation: Secondary | ICD-10-CM | POA: Diagnosis not present

## 2016-09-28 DIAGNOSIS — Z23 Encounter for immunization: Secondary | ICD-10-CM | POA: Diagnosis not present

## 2016-09-28 DIAGNOSIS — C3412 Malignant neoplasm of upper lobe, left bronchus or lung: Secondary | ICD-10-CM | POA: Diagnosis not present

## 2016-09-28 DIAGNOSIS — R748 Abnormal levels of other serum enzymes: Secondary | ICD-10-CM | POA: Insufficient documentation

## 2016-09-28 DIAGNOSIS — C8590 Non-Hodgkin lymphoma, unspecified, unspecified site: Secondary | ICD-10-CM

## 2016-09-28 DIAGNOSIS — K760 Fatty (change of) liver, not elsewhere classified: Secondary | ICD-10-CM | POA: Insufficient documentation

## 2016-09-28 DIAGNOSIS — I10 Essential (primary) hypertension: Secondary | ICD-10-CM | POA: Diagnosis not present

## 2016-09-28 DIAGNOSIS — Z9289 Personal history of other medical treatment: Secondary | ICD-10-CM

## 2016-09-28 LAB — CBC WITH DIFFERENTIAL/PLATELET
Basophils Absolute: 0.1 10*3/uL (ref 0–0.1)
Basophils Relative: 1 %
EOS ABS: 0.1 10*3/uL (ref 0–0.7)
Eosinophils Relative: 3 %
HCT: 40.6 % (ref 35.0–47.0)
HEMOGLOBIN: 14.2 g/dL (ref 12.0–16.0)
LYMPHS ABS: 1.7 10*3/uL (ref 1.0–3.6)
Lymphocytes Relative: 32 %
MCH: 32.5 pg (ref 26.0–34.0)
MCHC: 34.9 g/dL (ref 32.0–36.0)
MCV: 93.1 fL (ref 80.0–100.0)
MONOS PCT: 7 %
Monocytes Absolute: 0.4 10*3/uL (ref 0.2–0.9)
NEUTROS PCT: 57 %
Neutro Abs: 3 10*3/uL (ref 1.4–6.5)
Platelets: 180 10*3/uL (ref 150–440)
RBC: 4.37 MIL/uL (ref 3.80–5.20)
RDW: 13.3 % (ref 11.5–14.5)
WBC: 5.4 10*3/uL (ref 3.6–11.0)

## 2016-09-28 LAB — COMPREHENSIVE METABOLIC PANEL
ALK PHOS: 132 U/L — AB (ref 38–126)
ALT: 90 U/L — ABNORMAL HIGH (ref 14–54)
ANION GAP: 9 (ref 5–15)
AST: 81 U/L — ABNORMAL HIGH (ref 15–41)
Albumin: 4 g/dL (ref 3.5–5.0)
BILIRUBIN TOTAL: 0.9 mg/dL (ref 0.3–1.2)
BUN: 12 mg/dL (ref 6–20)
CALCIUM: 9.1 mg/dL (ref 8.9–10.3)
CO2: 28 mmol/L (ref 22–32)
Chloride: 103 mmol/L (ref 101–111)
Creatinine, Ser: 0.58 mg/dL (ref 0.44–1.00)
GFR calc non Af Amer: >60 mL/min (ref >60)
Glucose, Bld: 118 mg/dL — ABNORMAL HIGH (ref 65–99)
Potassium: 4 mmol/L (ref 3.5–5.1)
SODIUM: 140 mmol/L (ref 135–145)
TOTAL PROTEIN: 7.3 g/dL (ref 6.5–8.1)

## 2016-09-28 MED ORDER — INFLUENZA VAC SPLIT QUAD 0.5 ML IM SUSY
0.5000 mL | PREFILLED_SYRINGE | Freq: Once | INTRAMUSCULAR | Status: AC
  Administered 2016-09-28: 0.5 mL via INTRAMUSCULAR
  Filled 2016-09-28: qty 0.5

## 2016-09-28 NOTE — Progress Notes (Signed)
Julie Jennings  [phone]) [phone] Fax:(336) [phone]  ID: Julie Jennings OB: 29-Aug-1951  MR#: [id number]  WSF#:[id number]  Patient Care Team: Idelle Crouch, MD as PCP - General (Internal Medicine)  CHIEF COMPLAINT: No chief complaint on file. 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 (2) abnormal liver enzymes. Biopsy is suggestive of fatty liver changes 3.carcinoma of lung status post left upper lobe resection in July of 2014 Adenocarcinoma T1N0 M0 tumor EGFR positive   INTERVAL HISTORY: She is doing well with no pain, SOB, cough or hemoptysis, No nigth sweats, fevers or loss of weight, appetite and energy stable No paresthesias no plapbel breast lumps   REVIEW OF SYSTEMS:   ROS  As per HPI. Otherwise, a complete review of systems is negative.  PAST MEDICAL HISTORY: Past Medical History:  Diagnosis Date  . A-fib ([place])    Only once  . BP (high blood pressure) 04/16/2014  . Lung cancer ([place]) left  . Lymphoma ([place])    "stomach"    PAST SURGICAL HISTORY: Past Surgical History:  Procedure Laterality Date  . AUGMENTATION MAMMAPLASTY Bilateral   . DILATION AND CURETTAGE OF UTERUS    . LUNG REMOVAL, PARTIAL Left     FAMILY HISTORY: No family history on file.  ADVANCED DIRECTIVES (Y/N):  N  HEALTH MAINTENANCE: Social History  Substance Use Topics  . Smoking status: Never Smoker  . Smokeless tobacco: Not on file  . Alcohol use No     Colonoscopy:  PAP:  Bone density:  Lipid panel:  Allergies  Allergen Reactions  . Buspirone Nausea And Vomiting  . Codeine Nausea And Vomiting    Current Outpatient Prescriptions  Medication Sig Dispense Refill  . ALPRAZolam (XANAX) 0.5 MG tablet Take by mouth.    . celecoxib (CELEBREX) 200 MG capsule Take by mouth.    . Cholecalciferol (D 2000) 2000 UNITS TABS Take by mouth.    . furosemide (LASIX) 40 MG tablet Take by mouth.    . losartan (COZAAR) 50 MG tablet       . Multiple Vitamin (MULTI-VITAMINS) TABS Take by mouth.    . nebivolol (BYSTOLIC) 10 MG tablet Take by mouth.    . RABEprazole (ACIPHEX) 20 MG tablet Take by mouth.    . tiotropium (SPIRIVA) 18 MCG inhalation capsule Place into inhaler and inhale.    . venlafaxine XR (EFFEXOR-XR) 75 MG 24 hr capsule Take by mouth.     No current facility-administered medications for this visit.     OBJECTIVE: Vitals:   09/28/16 0856  BP: 129/80  Pulse: 71  Resp: 18  Temp: 97.8 F (36.6 C)     Body mass index is 31.84 kg/m.   2.09 meters squared  ECOG FS:0 - Asymptomatic  Physical Exam  Constitutional: She is oriented to person, place, and time. She appears well-developed and well-nourished.  HENT:  Head: Normocephalic and atraumatic.  Eyes: EOM are normal. Pupils are equal, round, and reactive to light.  Neck: Normal range of motion. Neck supple. No tracheal deviation present.  Cardiovascular: Normal rate and regular rhythm.   Pulmonary/Chest: Effort normal and breath sounds normal.  Abdominal: Soft. Bowel sounds are normal.  Musculoskeletal: Normal range of motion. She exhibits no edema.  Lymphadenopathy:    She has no cervical adenopathy.  Neurological: She is alert and oriented to person, place, and time.  Psychiatric: She has a normal mood and affect. Her behavior is normal.      LAB  RESULTS:  Lab Results  Component Value Date   NA 140 09/28/2016   K 4.0 09/28/2016   CL 103 09/28/2016   CO2 28 09/28/2016   GLUCOSE 118 (H) 09/28/2016   BUN 12 09/28/2016   CREATININE 0.58 09/28/2016   CALCIUM 9.1 09/28/2016   PROT 7.3 09/28/2016   ALBUMIN 4.0 09/28/2016   AST 81 (H) 09/28/2016   ALT 90 (H) 09/28/2016   ALKPHOS 132 (H) 09/28/2016   BILITOT 0.9 09/28/2016   GFRNONAA >60 09/28/2016   GFRAA >60 09/28/2016    Lab Results  Component Value Date   WBC 5.4 09/28/2016   NEUTROABS 3.0 09/28/2016   HGB 14.2 09/28/2016   HCT 40.6 09/28/2016   MCV 93.1 09/28/2016   PLT 180  09/28/2016     STUDIES: Ct Chest W Contrast  Result Date: 09/17/2016 CLINICAL DATA:  Lung cancer. EXAM: CT CHEST WITH CONTRAST TECHNIQUE: Multidetector CT imaging of the chest was performed during intravenous contrast administration. CONTRAST:  50m ISOVUE-300 IOPAMIDOL (ISOVUE-300) INJECTION 61% COMPARISON:  09/30/2015. FINDINGS: Cardiovascular: Atherosclerotic calcification of the arterial vasculature, including coronary arteries. Heart size normal. No pericardial effusion. Mediastinum/Nodes: No pathologically enlarged mediastinal, hilar or axillary lymph nodes. Esophagus is grossly unremarkable. Lungs/Pleura: Mild subpleural scarring/nodularity in the right middle and right lower lobes, unchanged. Left upper lobectomy. No pleural fluid. Airway is otherwise unremarkable. Upper Abdomen: Low-attenuation lesion in the liver measures 2.1 cm, as before, likely a cyst. Biliary ductal dilatation is likely related to cholecystectomy. Adrenal glands are unremarkable. Low-attenuation lesions in the left kidney measure up to 2.3 cm and are likely cysts. Visualized portions of the spleen, pancreas, stomach and bowel are grossly unremarkable. No upper abdominal adenopathy. Musculoskeletal: No worrisome lytic or sclerotic lesions. IMPRESSION: 1. Left upper lobectomy without evidence of recurrent or metastatic disease. 2.  Aortic atherosclerosis (ICD10-170.0). Electronically Signed   By: MLorin PicketM.D.   On: 09/17/2016 09:13    ASSESSMENT:   [place] In [place]. No signs of recurrence Ct scan from 09/2016 reviewed.  Cbc is stable continue observation  Fatty infiltration of liver elevated but stable LFTs  Cancer of upper lobe of left lung (HCC) Again no clinical or CT evidence of disease recurrence Continue surveillance return in 6 months or call for nay new symptoms    History of screening mammography Dr. COliva Bustardhad ordered her last Mammogram screening bilateral in 03/2016 and is reported  as benign, needing annual follow-up that will need to be requested for 03/2017 to be done prior to her next f/u visit.  1. CANCER  of lung stage I disease we will do surveillance CT scan of the chest which has been ordered and will be reviewed if it is abnormal we will call patient 2, screening mammogram has been ordered as it has not been done since June of 2015 3.  Diffuse be last cell lymphoma there is no evidence of recurrent disease LDH is slightly elevated 4.  Abnormal liver enzymes, stable.  Most likely due to fatty liver. Previously had evaluation and was considered due to fatty liver Repeat CT scan of the chest for follow-up regarding lung cancer in October of 2017      PLAN:    Patient expressed understanding and was in agreement with this plan. She also understands that She can call clinic at any time with any questions, concerns, or complaints.  Orders Placed This Encounter  Procedures  . MM DIGITAL SCREENING BILATERAL    Standing Status:   Future  Standing Expiration Date:   09/28/2017    Order Specific Question:   Reason for Exam (SYMPTOM  OR DIAGNOSIS REQUIRED)    Answer:   screening annual    Order Specific Question:   Preferred imaging location?    Answer:   [facility]    Return in about 6 months (around 03/29/2017). No matching staging information was found for the patient.  Creola Corn, MD   09/28/2016 1:19 PM

## 2016-09-28 NOTE — Progress Notes (Signed)
Patient is here for test results. She would like flu shot

## 2016-09-28 NOTE — Assessment & Plan Note (Signed)
In remission. No signs of recurrence Ct scan from 09/2016 reviewed.  Cbc is stable continue observation

## 2016-09-28 NOTE — Assessment & Plan Note (Signed)
elevated but stable LFTs

## 2016-09-28 NOTE — Assessment & Plan Note (Addendum)
Again no clinical or CT evidence of disease recurrence Continue surveillance return in 6 months or call for nay new symptoms

## 2016-09-28 NOTE — Assessment & Plan Note (Signed)
Dr. Oliva Bustard had ordered her last Mammogram screening bilateral in 03/2016 and is reported as benign, needing annual follow-up that will need to be requested for 03/2017 to be done prior to her next f/u visit.

## 2016-10-02 ENCOUNTER — Ambulatory Visit: Payer: Medicare Other

## 2016-10-02 ENCOUNTER — Other Ambulatory Visit: Payer: Self-pay

## 2016-10-02 ENCOUNTER — Other Ambulatory Visit: Payer: Self-pay | Admitting: *Deleted

## 2016-10-02 ENCOUNTER — Telehealth: Payer: Self-pay | Admitting: *Deleted

## 2016-10-02 ENCOUNTER — Other Ambulatory Visit: Payer: Medicare Other

## 2016-10-02 DIAGNOSIS — C8590 Non-Hodgkin lymphoma, unspecified, unspecified site: Secondary | ICD-10-CM

## 2016-10-02 MED ORDER — ALPRAZOLAM 0.5 MG PO TABS: 0.5000 mg | ORAL_TABLET | Freq: Three times a day (TID) | ORAL | 0 refills | Status: DC | PRN

## 2016-10-02 NOTE — Telephone Encounter (Signed)
Pt had come to see Dr. Bangladesh last week and she had requested xanax and celebrex refill.  When I called the drugstore pt has been getting 90 day refill and I had let pt know. She thought I was going to call in 88monthsupply and then she wilt ask [facility] to refill.  So she called because she will run out today and is going out of town today and will not rtn til Sunday.  She wants to know if Dr. PBangladeshwould call it in. It is difficult for her to get drug refills through his office and usally takes 3 days.  I asked Dr. PBangladeshand she was agreeable to refill for 1 week and [facility]and I called pt and let her know that she refilled x 1 week and that would get her through weekend and time to call [facility] to refill. Pt agreeable to plan.

## 2016-12-14 ENCOUNTER — Other Ambulatory Visit: Payer: Self-pay | Admitting: Oncology

## 2016-12-17 ENCOUNTER — Other Ambulatory Visit: Payer: Self-pay | Admitting: *Deleted

## 2016-12-17 MED ORDER — LORATADINE 10 MG PO TABS: 10.0000 mg | ORAL_TABLET | Freq: Every day | ORAL | 3 refills | Status: DC

## 2017-03-28 ENCOUNTER — Other Ambulatory Visit: Payer: Self-pay | Admitting: *Deleted

## 2017-03-28 DIAGNOSIS — C3412 Malignant neoplasm of upper lobe, left bronchus or lung: Secondary | ICD-10-CM

## 2017-03-29 ENCOUNTER — Inpatient Hospital Stay: Payer: Medicare Other | Attending: Internal Medicine

## 2017-03-29 ENCOUNTER — Inpatient Hospital Stay (HOSPITAL_BASED_OUTPATIENT_CLINIC_OR_DEPARTMENT_OTHER): Payer: Medicare Other | Admitting: Internal Medicine

## 2017-03-29 DIAGNOSIS — K760 Fatty (change of) liver, not elsewhere classified: Secondary | ICD-10-CM | POA: Insufficient documentation

## 2017-03-29 DIAGNOSIS — Z85118 Personal history of other malignant neoplasm of bronchus and lung: Secondary | ICD-10-CM | POA: Insufficient documentation

## 2017-03-29 DIAGNOSIS — I4891 Unspecified atrial fibrillation: Secondary | ICD-10-CM | POA: Diagnosis not present

## 2017-03-29 DIAGNOSIS — Z8572 Personal history of non-Hodgkin lymphomas: Secondary | ICD-10-CM | POA: Insufficient documentation

## 2017-03-29 DIAGNOSIS — K219 Gastro-esophageal reflux disease without esophagitis: Secondary | ICD-10-CM | POA: Diagnosis not present

## 2017-03-29 DIAGNOSIS — Z79899 Other long term (current) drug therapy: Secondary | ICD-10-CM | POA: Diagnosis not present

## 2017-03-29 DIAGNOSIS — Z9221 Personal history of antineoplastic chemotherapy: Secondary | ICD-10-CM | POA: Insufficient documentation

## 2017-03-29 DIAGNOSIS — C3412 Malignant neoplasm of upper lobe, left bronchus or lung: Secondary | ICD-10-CM

## 2017-03-29 LAB — LACTATE DEHYDROGENASE: LDH: 179 U/L (ref 98–192)

## 2017-03-29 LAB — COMPREHENSIVE METABOLIC PANEL
ALBUMIN: 3.9 g/dL (ref 3.5–5.0)
ALK PHOS: 136 U/L — AB (ref 38–126)
ALT: 71 U/L — ABNORMAL HIGH (ref 14–54)
AST: 79 U/L — AB (ref 15–41)
Anion gap: 4 — ABNORMAL LOW (ref 5–15)
BILIRUBIN TOTAL: 0.8 mg/dL (ref 0.3–1.2)
BUN: 13 mg/dL (ref 6–20)
CALCIUM: 9.3 mg/dL (ref 8.9–10.3)
CO2: 30 mmol/L (ref 22–32)
Chloride: 105 mmol/L (ref 101–111)
Creatinine, Ser: 0.57 mg/dL (ref 0.44–1.00)
GFR calc Af Amer: >60 mL/min (ref >60)
GFR calc non Af Amer: >60 mL/min (ref >60)
GLUCOSE: 114 mg/dL — AB (ref 65–99)
Potassium: 4 mmol/L (ref 3.5–5.1)
Sodium: 139 mmol/L (ref 135–145)
TOTAL PROTEIN: 7.2 g/dL (ref 6.5–8.1)

## 2017-03-29 LAB — CBC WITH DIFFERENTIAL/PLATELET
BASOS ABS: 0.1 10*3/uL (ref 0–0.1)
BASOS PCT: 1 %
Eosinophils Absolute: 0.2 10*3/uL (ref 0–0.7)
Eosinophils Relative: 3 %
HEMATOCRIT: 38.4 % (ref 35.0–47.0)
HEMOGLOBIN: 13.4 g/dL (ref 12.0–16.0)
Lymphocytes Relative: 30 %
Lymphs Abs: 1.6 10*3/uL (ref 1.0–3.6)
MCH: 32.3 pg (ref 26.0–34.0)
MCHC: 34.9 g/dL (ref 32.0–36.0)
MCV: 92.6 fL (ref 80.0–100.0)
MONOS PCT: 6 %
Monocytes Absolute: 0.3 10*3/uL (ref 0.2–0.9)
NEUTROS ABS: 3.2 10*3/uL (ref 1.4–6.5)
NEUTROS PCT: 60 %
Platelets: 171 10*3/uL (ref 150–440)
RBC: 4.14 MIL/uL (ref 3.80–5.20)
RDW: 13.7 % (ref 11.5–14.5)
WBC: 5.3 10*3/uL (ref 3.6–11.0)

## 2017-03-29 NOTE — Progress Notes (Signed)
Patient here today for follow up.   

## 2017-03-29 NOTE — Assessment & Plan Note (Addendum)
#   LUL lung ca- stage I; clinically no evidence of recurrence.   # DLBCL s/p chemo; clinically NED. Likely cured; no imaging needed.   # slightly elevated LFts-? Fatty liver; better/stable.   # GERD- okay with prilosec/zegrid.  # Blood pressure- well controlled; pt concerned re: being on 2 blood pressure medications- recommend checking BP at home; and take the log to pcp.   # follow up in 6 months/CT chest scan prior/labs.

## 2017-03-29 NOTE — Progress Notes (Signed)
[facility] OFFICE PROGRESS NOTE  Patient Care Team: Idelle Crouch, MD as PCP - General (Internal Medicine)  Cancer Staging No matching staging information was found for the patient.   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 (2) abnormal liver enzymes. Biopsy is suggestive of fatty liver changes 3.carcinoma of lung status post left upper lobe resection in July of 2014 Adenocarcinoma T1N0 M0 tumor EGFR positive     Cancer of upper lobe of left lung ([place])     This is my first interaction with the patient as patient's primary oncologist has been Dr.Choksi. I reviewed the patient's prior charts/pertinent labs/imaging in detail; findings are summarized above.    INTERVAL HISTORY:  Julie Jennings 66 y.o.  female pleasant patient above history of Diffuse large cell lymphoma and also history of stage I lung cancer is here for follow-up.  Patient denies any new shortness of breath or cough. Denies any lumps or bumps. Denies any chest pain or hemoptysis. Last CT scan of the chest was approximately 6 months ago that was NED.   REVIEW OF SYSTEMS:  A complete 10 point review of system is done which is negative except mentioned above/history of present illness.   PAST MEDICAL HISTORY :  Past Medical History:  Diagnosis Date  . A-fib ([place])    Only once  . BP (high blood pressure) 04/16/2014  . Lung cancer ([place]) left  . Lymphoma ([place])    "stomach"    PAST SURGICAL HISTORY :   Past Surgical History:  Procedure Laterality Date  . AUGMENTATION MAMMAPLASTY Bilateral   . DILATION AND CURETTAGE OF UTERUS    . LUNG REMOVAL, PARTIAL Left     FAMILY HISTORY :  No family history on file.  SOCIAL HISTORY:   Social History  Substance Use Topics  . Smoking status: Never Smoker  . Smokeless tobacco: Not on file  . Alcohol use No    ALLERGIES:  is allergic to buspirone and codeine.  MEDICATIONS:  Current Outpatient Prescriptions  Medication  Sig Dispense Refill  . ALPRAZolam (XANAX) 0.5 MG tablet Take 1 tablet (0.5 mg total) by mouth 3 (three) times daily as needed for anxiety. 21 tablet 0  . Cholecalciferol (D 2000) 2000 UNITS TABS Take by mouth.    . loratadine (CLARITIN) 10 MG tablet Take 1 tablet (10 mg total) by mouth daily. 90 tablet 3  . losartan (COZAAR) 50 MG tablet     . nebivolol (BYSTOLIC) 10 MG tablet Take by mouth.    . tiotropium (SPIRIVA) 18 MCG inhalation capsule Place into inhaler and inhale.    . venlafaxine XR (EFFEXOR-XR) 75 MG 24 hr capsule Take by mouth.    . furosemide (LASIX) 40 MG tablet Take by mouth.     No current facility-administered medications for this visit.     PHYSICAL EXAMINATION: ECOG PERFORMANCE STATUS: 0 - Asymptomatic  BP 112/77 (BP Location: Left Arm, Patient Position: Sitting)   Pulse 65   Temp 97.2 F (36.2 C) (Tympanic)   Resp 16   Wt 203 lb (92.1 kg)   SpO2 97%   BMI 31.79 kg/m   Filed Weights   03/29/17 0828  Weight: 203 lb (92.1 kg)    GENERAL: Well-nourished well-developed; Alert, no distress and comfortable.   Accompanied by her husband.  EYES: no pallor or icterus OROPHARYNX: no thrush or ulceration; good dentition  NECK: supple, no masses felt LYMPH:  no palpable lymphadenopathy in the  cervical, axillary or inguinal regions LUNGS: clear to auscultation and  No wheeze or crackles HEART/CVS: regular rate & rhythm and no murmurs; No lower extremity edema ABDOMEN:abdomen soft, non-tender and normal bowel sounds Musculoskeletal:no cyanosis of digits and no clubbing  PSYCH: alert & oriented x 3 with fluent speech NEURO: no focal motor/sensory deficits SKIN:  no rashes or significant lesions  LABORATORY DATA:  I have reviewed the data as listed    Component Value Date/Time   NA 139 03/29/2017 0759   NA 139 03/14/2015 0856   K 4.0 03/29/2017 0759   K 4.1 03/14/2015 0856   CL 105 03/29/2017 0759   CL 103 03/14/2015 0856   CO2 30 03/29/2017 0759   CO2 28  03/14/2015 0856   GLUCOSE 114 (H) 03/29/2017 0759   GLUCOSE 111 (H) 03/14/2015 0856   BUN 13 03/29/2017 0759   BUN 19 03/14/2015 0856   CREATININE 0.57 03/29/2017 0759   CREATININE 0.56 03/14/2015 0856   CALCIUM 9.3 03/29/2017 0759   CALCIUM 9.3 03/14/2015 0856   PROT 7.2 03/29/2017 0759   PROT 7.7 03/14/2015 0856   ALBUMIN 3.9 03/29/2017 0759   ALBUMIN 4.3 03/14/2015 0856   AST 79 (H) 03/29/2017 0759   AST 80 (H) 03/14/2015 0856   ALT 71 (H) 03/29/2017 0759   ALT 87 (H) 03/14/2015 0856   ALKPHOS 136 (H) 03/29/2017 0759   ALKPHOS 164 (H) 03/14/2015 0856   BILITOT 0.8 03/29/2017 0759   BILITOT 0.8 03/14/2015 0856   GFRNONAA >60 03/29/2017 0759   GFRNONAA >60 03/14/2015 0856   GFRAA >60 03/29/2017 0759   GFRAA >60 03/14/2015 0856    No results found for: SPEP, UPEP  Lab Results  Component Value Date   WBC 5.3 03/29/2017   NEUTROABS 3.2 03/29/2017   HGB 13.4 03/29/2017   HCT 38.4 03/29/2017   MCV 92.6 03/29/2017   PLT 171 03/29/2017      Chemistry      Component Value Date/Time   NA 139 03/29/2017 0759   NA 139 03/14/2015 0856   K 4.0 03/29/2017 0759   K 4.1 03/14/2015 0856   CL 105 03/29/2017 0759   CL 103 03/14/2015 0856   CO2 30 03/29/2017 0759   CO2 28 03/14/2015 0856   BUN 13 03/29/2017 0759   BUN 19 03/14/2015 0856   CREATININE 0.57 03/29/2017 0759   CREATININE 0.56 03/14/2015 0856      Component Value Date/Time   CALCIUM 9.3 03/29/2017 0759   CALCIUM 9.3 03/14/2015 0856   ALKPHOS 136 (H) 03/29/2017 0759   ALKPHOS 164 (H) 03/14/2015 0856   AST 79 (H) 03/29/2017 0759   AST 80 (H) 03/14/2015 0856   ALT 71 (H) 03/29/2017 0759   ALT 87 (H) 03/14/2015 0856   BILITOT 0.8 03/29/2017 0759   BILITOT 0.8 03/14/2015 0856       RADIOGRAPHIC STUDIES: I have personally reviewed the radiological images as listed and agreed with the findings in the report. No results found.   ASSESSMENT & PLAN:  Cancer of upper lobe of left lung ([place]) # LUL lung ca-  stage I; clinically no evidence of recurrence.   # DLBCL s/p chemo; clinically NED. Likely cured; no imaging needed.   # slightly elevated LFts-? Fatty liver; better/stable.   # GERD- okay with prilosec/zegrid.  # Blood pressure- well controlled; pt concerned re: being on 2 blood pressure medications- recommend checking BP at home; and take the log to pcp.   # follow  up in 6 months/CT chest scan prior/labs.    Orders Placed This Encounter  Procedures  . CT CHEST W CONTRAST    Standing Status:   Future    Standing Expiration Date:   05/29/2018    Order Specific Question:   Reason for Exam (SYMPTOM  OR DIAGNOSIS REQUIRED)    Answer:   lung cancer; lymphoma    Order Specific Question:   Preferred imaging location?    Answer:   [facility]  . CBC with Differential    Standing Status:   Future    Standing Expiration Date:   03/29/2018  . Comprehensive metabolic panel    Standing Status:   Future    Standing Expiration Date:   03/29/2018   All questions were answered. The patient knows to call the clinic with any problems, questions or concerns.      Cammie Sickle, MD 03/31/2017 6:21 PM

## 2017-05-01 ENCOUNTER — Ambulatory Visit (INDEPENDENT_AMBULATORY_CARE_PROVIDER_SITE_OTHER): Payer: Medicare Other | Admitting: Obstetrics and Gynecology

## 2017-05-01 ENCOUNTER — Encounter: Payer: Self-pay | Admitting: Obstetrics and Gynecology

## 2017-05-01 VITALS — BP 128/84 | Ht 66.0 in | Wt 208.0 lb

## 2017-05-01 DIAGNOSIS — Z1331 Encounter for screening for depression: Secondary | ICD-10-CM

## 2017-05-01 DIAGNOSIS — Z01419 Encounter for gynecological examination (general) (routine) without abnormal findings: Secondary | ICD-10-CM | POA: Diagnosis not present

## 2017-05-01 DIAGNOSIS — Z1211 Encounter for screening for malignant neoplasm of colon: Secondary | ICD-10-CM

## 2017-05-01 DIAGNOSIS — Z124 Encounter for screening for malignant neoplasm of cervix: Secondary | ICD-10-CM

## 2017-05-01 DIAGNOSIS — Z1231 Encounter for screening mammogram for malignant neoplasm of breast: Secondary | ICD-10-CM | POA: Diagnosis not present

## 2017-05-01 DIAGNOSIS — Z1339 Encounter for screening examination for other mental health and behavioral disorders: Secondary | ICD-10-CM

## 2017-05-01 DIAGNOSIS — Z1389 Encounter for screening for other disorder: Secondary | ICD-10-CM | POA: Diagnosis not present

## 2017-05-01 DIAGNOSIS — Z1239 Encounter for other screening for malignant neoplasm of breast: Secondary | ICD-10-CM

## 2017-05-01 LAB — HEMOCCULT GUIAC POC 1CARD (OFFICE): FECAL OCCULT BLD: NEGATIVE

## 2017-05-01 NOTE — Progress Notes (Signed)
Routine Annual Gynecology Examination   PCP: Idelle Crouch, MD  Chief Complaint  Patient presents with  . Annual Exam    History of Present Illness: Patient is a 66 y.o. G0P0000 presents for annual exam. The patient has no complaints today.   Menopausal bleeding: denies  Menopausal symptoms: denies  Breast symptoms: denies  Last pap smear: 1 years ago.  Result Normal  Last mammogram: 1 years ago.  Result Normal  Past Medical History:  Diagnosis Date  . A-fib ([place])    Only once  . BP (high blood pressure) 04/16/2014  . Endometriosis   . Lung cancer ([place]) left  . Lymphoma ([place])    "stomach"    Past Surgical History:  Procedure Laterality Date  . AUGMENTATION MAMMAPLASTY Bilateral   . DILATION AND CURETTAGE OF UTERUS    . LUNG REMOVAL, PARTIAL Left     Medications:   Medication Sig Start Date End Date Taking? Authorizing Provider  ALPRAZolam Duanne Moron) 0.5 MG tablet Take 1 tablet (0.5 mg total) by mouth 3 (three) times daily as needed for anxiety. 10/02/16  Yes Creola Corn, MD  Cholecalciferol (D 2000) 2000 UNITS TABS Take by mouth.   Yes [provider]  gabapentin (NEURONTIN) 100 MG capsule Take 100 mg by mouth 3 (three) times daily.   Yes [provider]  losartan (COZAAR) 50 MG tablet  04/13/16  Yes [provider]  nebivolol (BYSTOLIC) 10 MG tablet Take by mouth. 09/30/14  Yes [provider]  tiotropium (SPIRIVA) 18 MCG inhalation capsule Place into inhaler and inhale. 09/30/14  Yes [provider]  venlafaxine XR (EFFEXOR-XR) 75 MG 24 hr capsule Take by mouth. 07/25/15  Yes [provider]  furosemide (LASIX) 40 MG tablet Take by mouth. 09/30/14 09/28/16  [provider]  loratadine (CLARITIN) 10 MG tablet Take 1 tablet (10 mg total) by mouth daily. Patient not taking: Reported on 05/01/2017 12/17/16   Cammie Sickle, MD    Allergies  Allergen Reactions  . Buspirone Nausea And  Vomiting  . Codeine Nausea And Vomiting    Gynecologic History:  No LMP recorded. Patient is postmenopausal. Contraception: none Last Pap: 1 year . Results were: normal Last mammogram: 1 year . Results were: normal. 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   Obstetric History: G0P0000  Social History   Social History  . Marital status: Married    Spouse name: N/A  . Number of children: N/A  . Years of education: N/A   Occupational History  . Not on file.   Social History Main Topics  . Smoking status: Never Smoker  . Smokeless tobacco: Never Used  . Alcohol use No  . Drug use: No  . Sexual activity: Yes    Birth control/ protection: Post-menopausal   Other Topics Concern  . Not on file   Social History Narrative  . No narrative on file    Family History  Problem Relation Age of Onset  . Stroke Mother   . Diabetes Mother   . Colon cancer Father   . Prostate cancer Father     Review of Systems  Constitutional: Negative.   HENT: Negative.   Eyes: Negative.   Respiratory: Negative.   Cardiovascular: Negative.   Gastrointestinal: Negative.   Genitourinary: Negative.   Musculoskeletal: Negative.   Skin: Negative.   Neurological: Negative.   Psychiatric/Behavioral: Negative.      Physical Exam Vitals: BP 128/84  Ht 5\' 6"  (1.676 m)   Wt 208 lb (94.3 kg)   BMI 33.57 kg/m   Physical Exam  Constitutional: She is oriented to person, place, and time and well-developed, well-nourished, and in no distress. No distress.  HENT:  Head: Normocephalic and atraumatic.  Eyes: Conjunctivae are normal. Left eye exhibits no discharge. No scleral icterus.  Neck: Normal range of motion. Neck supple.  Cardiovascular: Normal rate and regular rhythm.  Exam reveals no gallop and no friction rub.   No murmur heard. Pulmonary/Chest: Effort normal and breath sounds normal. No respiratory distress. She has no  wheezes. She has no rales.  Abdominal: Soft. Bowel sounds are normal. She exhibits no distension and no mass. There is no tenderness. There is no rebound and no guarding.  Genitourinary: Vagina normal, uterus normal, cervix normal, right adnexa normal, left adnexa normal and vulva normal. Rectal exam shows guaiac negative stool.  Musculoskeletal: Normal range of motion. She exhibits no edema.  Lymphadenopathy:    She has no cervical adenopathy.  Neurological: She is alert and oriented to person, place, and time. No cranial nerve deficit.  Skin: Skin is warm and dry. No rash noted.  Psychiatric: Mood, affect and judgment normal.    Female chaperone present for pelvic and breast  portions of the physical exam  Results: AUDIT Questionnaire (screen for alcoholism): 0 PHQ-9: 7  Fecal Occult Blood test Lab Results  Component Value Date   OCCULTBLD Negative 05/01/2017     Assessment and Plan:  66 y.o. G0P0000 for annual gynecologic examination, doing well.  Plan:  Screening: -- Blood pressure screen normal. -- Colonoscopy - per PCP. Fecal occult blood test negative today (patient requested) -- Mammogram - per other provider. -- Weight screening: per PCP.  Obesity with BMI 33. Deferred discussion today.  -- Depression screening negative (PHQ-9) -- Nutrition: normal -- cholesterol screening: n/a -- osteoporosis screening: per PCP. May revisit at next annual.  -- tobacco screening: not using -- alcohol screening: AUDIT questionnaire indicates low-risk usage. -- family history of breast cancer screening: done. not at high risk. -- no evidence of domestic violence or intimate partner violence. -- STD screening: gonorrhea/chlamydia NAAT not collected. -- pap smear collected.  Prentice Docker, MD 05/01/2017 9:16 AM

## 2017-05-03 LAB — PAP IG (IMAGE GUIDED): PAP Smear Comment: 0

## 2017-05-16 ENCOUNTER — Encounter: Payer: Self-pay | Admitting: Obstetrics and Gynecology

## 2017-06-25 ENCOUNTER — Other Ambulatory Visit: Payer: Self-pay | Admitting: Internal Medicine

## 2017-06-25 DIAGNOSIS — M5440 Lumbago with sciatica, unspecified side: Secondary | ICD-10-CM

## 2017-07-02 ENCOUNTER — Ambulatory Visit
Admission: RE | Admit: 2017-07-02 | Discharge: 2017-07-02 | Disposition: A | Discharge status: Home / Self Care | Payer: Medicare Other | Source: Ambulatory Visit | Attending: Internal Medicine | Admitting: Internal Medicine

## 2017-07-02 DIAGNOSIS — M5440 Lumbago with sciatica, unspecified side: Secondary | ICD-10-CM | POA: Insufficient documentation

## 2017-07-03 ENCOUNTER — Ambulatory Visit: Payer: Medicare Other

## 2017-07-11 ENCOUNTER — Telehealth: Payer: Self-pay

## 2017-07-11 NOTE — Telephone Encounter (Signed)
Pt needs to speak directly to [facility] tomorrow when he comes in. She had an MRI done on her back.  They saw something she needs to talk to you about.  Says a nurse won't be able to help her.  The doctor who did the MRI suggested she talk to you.  [phone]

## 2017-07-16 NOTE — Telephone Encounter (Signed)
Spoke with patient.  Discussed cysts on kidney. Suggested that she call her oncologist, Dr. Jacinto Reap. If I can be of further assistance I would be happy to do so.

## 2017-08-13 ENCOUNTER — Other Ambulatory Visit: Payer: Self-pay | Admitting: Oncology

## 2017-09-01 ENCOUNTER — Other Ambulatory Visit: Payer: Self-pay | Admitting: Oncology

## 2017-09-25 ENCOUNTER — Ambulatory Visit: Admission: RE | Admit: 2017-09-25 | Payer: Medicare Other | Source: Ambulatory Visit

## 2017-09-26 ENCOUNTER — Other Ambulatory Visit: Payer: Medicare Other

## 2017-09-26 ENCOUNTER — Ambulatory Visit: Payer: Medicare Other | Admitting: Internal Medicine

## 2017-09-27 ENCOUNTER — Other Ambulatory Visit: Payer: Medicare Other

## 2017-09-27 ENCOUNTER — Ambulatory Visit: Payer: Medicare Other | Admitting: Internal Medicine

## 2017-10-01 ENCOUNTER — Ambulatory Visit
Admission: RE | Admit: 2017-10-01 | Discharge: 2017-10-01 | Disposition: A | Discharge status: Home / Self Care | Payer: Medicare Other | Source: Ambulatory Visit | Attending: Internal Medicine | Admitting: Internal Medicine

## 2017-10-01 ENCOUNTER — Other Ambulatory Visit
Admission: RE | Admit: 2017-10-01 | Discharge: 2017-10-01 | Disposition: A | Discharge status: Home / Self Care | Payer: Medicare Other | Source: Ambulatory Visit | Attending: Internal Medicine | Admitting: Internal Medicine

## 2017-10-01 DIAGNOSIS — C3412 Malignant neoplasm of upper lobe, left bronchus or lung: Secondary | ICD-10-CM | POA: Diagnosis present

## 2017-10-01 DIAGNOSIS — Z902 Acquired absence of lung [part of]: Secondary | ICD-10-CM | POA: Insufficient documentation

## 2017-10-01 DIAGNOSIS — I700 Atherosclerosis of aorta: Secondary | ICD-10-CM | POA: Insufficient documentation

## 2017-10-01 DIAGNOSIS — I2510 Atherosclerotic heart disease of native coronary artery without angina pectoris: Secondary | ICD-10-CM | POA: Diagnosis not present

## 2017-10-01 LAB — CBC WITH DIFFERENTIAL/PLATELET
BASOS ABS: 0.1 10*3/uL (ref 0–0.1)
BASOS PCT: 1 %
Eosinophils Absolute: 0.2 10*3/uL (ref 0–0.7)
Eosinophils Relative: 3 %
HEMATOCRIT: 40.4 % (ref 35.0–47.0)
HEMOGLOBIN: 13.9 g/dL (ref 12.0–16.0)
Lymphocytes Relative: 26 %
Lymphs Abs: 1.7 10*3/uL (ref 1.0–3.6)
MCH: 32.2 pg (ref 26.0–34.0)
MCHC: 34.4 g/dL (ref 32.0–36.0)
MCV: 93.6 fL (ref 80.0–100.0)
Monocytes Absolute: 0.5 10*3/uL (ref 0.2–0.9)
Monocytes Relative: 7 %
NEUTROS ABS: 4.3 10*3/uL (ref 1.4–6.5)
NEUTROS PCT: 63 %
Platelets: 188 10*3/uL (ref 150–440)
RBC: 4.31 MIL/uL (ref 3.80–5.20)
RDW: 13.5 % (ref 11.5–14.5)
WBC: 6.7 10*3/uL (ref 3.6–11.0)

## 2017-10-01 LAB — COMPREHENSIVE METABOLIC PANEL
ALBUMIN: 4.1 g/dL (ref 3.5–5.0)
ALK PHOS: 158 U/L — AB (ref 38–126)
ALT: 75 U/L — ABNORMAL HIGH (ref 14–54)
AST: 81 U/L — AB (ref 15–41)
Anion gap: 8 (ref 5–15)
BILIRUBIN TOTAL: 0.9 mg/dL (ref 0.3–1.2)
BUN: 14 mg/dL (ref 6–20)
CALCIUM: 9.4 mg/dL (ref 8.9–10.3)
CO2: 29 mmol/L (ref 22–32)
Chloride: 102 mmol/L (ref 101–111)
Creatinine, Ser: 0.62 mg/dL (ref 0.44–1.00)
GFR calc Af Amer: >60 mL/min (ref >60)
GFR calc non Af Amer: >60 mL/min (ref >60)
GLUCOSE: 113 mg/dL — AB (ref 65–99)
POTASSIUM: 4.4 mmol/L (ref 3.5–5.1)
Sodium: 139 mmol/L (ref 135–145)
TOTAL PROTEIN: 7.8 g/dL (ref 6.5–8.1)

## 2017-10-01 MED ORDER — IOPAMIDOL (ISOVUE-300) INJECTION 61%
75.0000 mL | Freq: Once | INTRAVENOUS | Status: AC | PRN
  Administered 2017-10-01: 75 mL via INTRAVENOUS

## 2017-10-02 ENCOUNTER — Ambulatory Visit: Payer: Medicare Other

## 2017-10-03 ENCOUNTER — Inpatient Hospital Stay: Payer: Medicare Other

## 2017-10-03 ENCOUNTER — Inpatient Hospital Stay: Payer: Medicare Other | Admitting: Internal Medicine

## 2017-10-07 ENCOUNTER — Other Ambulatory Visit: Payer: Medicare Other

## 2017-10-07 ENCOUNTER — Inpatient Hospital Stay: Payer: Medicare Other | Attending: Internal Medicine | Admitting: Internal Medicine

## 2017-10-07 VITALS — BP 131/84 | HR 69 | Temp 98.1°F | Resp 14 | Wt 201.2 lb

## 2017-10-07 DIAGNOSIS — Z9221 Personal history of antineoplastic chemotherapy: Secondary | ICD-10-CM | POA: Insufficient documentation

## 2017-10-07 DIAGNOSIS — Z8572 Personal history of non-Hodgkin lymphomas: Secondary | ICD-10-CM | POA: Diagnosis not present

## 2017-10-07 DIAGNOSIS — K760 Fatty (change of) liver, not elsewhere classified: Secondary | ICD-10-CM | POA: Diagnosis not present

## 2017-10-07 DIAGNOSIS — K219 Gastro-esophageal reflux disease without esophagitis: Secondary | ICD-10-CM | POA: Diagnosis not present

## 2017-10-07 DIAGNOSIS — I10 Essential (primary) hypertension: Secondary | ICD-10-CM | POA: Insufficient documentation

## 2017-10-07 DIAGNOSIS — R7989 Other specified abnormal findings of blood chemistry: Secondary | ICD-10-CM | POA: Insufficient documentation

## 2017-10-07 DIAGNOSIS — C3412 Malignant neoplasm of upper lobe, left bronchus or lung: Secondary | ICD-10-CM | POA: Insufficient documentation

## 2017-10-07 DIAGNOSIS — Z79899 Other long term (current) drug therapy: Secondary | ICD-10-CM | POA: Insufficient documentation

## 2017-10-07 NOTE — Progress Notes (Signed)
[place] OFFICE PROGRESS NOTE  Patient Care Team: Idelle Crouch, MD as PCP - General (Internal Medicine)  Cancer Staging No matching staging information was found for the patient.   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 (2) abnormal liver enzymes. Biopsy is suggestive of fatty liver changes 3.carcinoma of lung status post left upper lobe resection in July of 2014 Adenocarcinoma T1N0 M0 tumor EGFR positive     Cancer of upper lobe of left lung (HCC)    INTERVAL HISTORY:  Julie Jennings [age] y.o.  female pleasant patient above history of diffuse large cell lymphoma and also history of stage I lung cancer is here for follow-up.  Patient denies any new shortness of breath or cough. Denies any lumps or bumps. Denies any chest pain or hemoptysis. Last CT scan of the chest was 10/01/2017 s/p left upper lobectomy without evidence of disease.   Continues to have some globus sensation w/ meals. Taking Protonix. Sees Dr. Vira Agar for GI. She has now retired and overall feels well.   REVIEW OF SYSTEMS:  A complete 10 point review of system is done which is negative except mentioned above/history of present illness.   PAST MEDICAL HISTORY :  Past Medical History:  Diagnosis Date  . A-fib ([place])    Only once  . BP (high blood pressure) 04/16/2014  . Endometriosis   . Lung cancer ([place]) left  . Lymphoma ([place])    "stomach"    PAST SURGICAL HISTORY :   Past Surgical History:  Procedure Laterality Date  . AUGMENTATION MAMMAPLASTY Bilateral   . DILATION AND CURETTAGE OF UTERUS    . LUNG REMOVAL, PARTIAL Left     FAMILY HISTORY :   Family History  Problem Relation Age of Onset  . Stroke Mother   . Diabetes Mother   . Colon cancer Father   . Prostate cancer Father     SOCIAL HISTORY:   Social History  Substance Use Topics  . Smoking status: Never Smoker  . Smokeless tobacco: Never Used  . Alcohol use No    ALLERGIES:  is allergic  to buspirone and codeine.  MEDICATIONS:  Current Outpatient Prescriptions  Medication Sig Dispense Refill  . ALPRAZolam (XANAX) 0.5 MG tablet Take 1 tablet (0.5 mg total) by mouth 3 (three) times daily as needed for anxiety. 21 tablet 0  . Cholecalciferol (D 2000) 2000 UNITS TABS Take by mouth.    . furosemide (LASIX) 40 MG tablet Take by mouth.    . gabapentin (NEURONTIN) 100 MG capsule Take 100 mg by mouth 3 (three) times daily.    Marland Kitchen loratadine (CLARITIN) 10 MG tablet Take 1 tablet (10 mg total) by mouth daily. 90 tablet 3  . losartan (COZAAR) 50 MG tablet     . nebivolol (BYSTOLIC) 10 MG tablet Take by mouth.    . pantoprazole (PROTONIX) 40 MG tablet Take by mouth.    . tiotropium (SPIRIVA) 18 MCG inhalation capsule Place into inhaler and inhale.    . venlafaxine XR (EFFEXOR-XR) 75 MG 24 hr capsule Take by mouth.    . meloxicam (MOBIC) 15 MG tablet Take by mouth.     No current facility-administered medications for this visit.     PHYSICAL EXAMINATION: ECOG PERFORMANCE STATUS: 0 - Asymptomatic  BP 131/84 (BP Location: Left Arm, Patient Position: Sitting)   Pulse 69   Temp 98.1 F (36.7 C) (Tympanic)   Resp 14   Wt 201 lb  3.2 oz (91.3 kg)   BMI 32.47 kg/m   Filed Weights   10/07/17 0926  Weight: 201 lb 3.2 oz (91.3 kg)    GENERAL: Well-nourished well-developed; Alert, no distress and comfortable. Alone EYES: no pallor or icterus OROPHARYNX: no thrush or ulceration; good dentition  NECK: supple, no masses felt LYMPH: no palpable lymphadenopathy in the cervical, axillary or inguinal regions LUNGS: clear to auscultation and  No wheeze or crackles HEART/CVS: regular rate & rhythm and no murmurs; No lower extremity edema ABDOMEN: abdomen soft, non-tender and normal bowel sounds Musculoskeletal: no cyanosis of digits and no clubbing  PSYCH: alert & oriented x 3 with fluent speech NEURO: no focal motor/sensory deficits SKIN: no rashes or significant lesions  LABORATORY  DATA:  I have reviewed the data as listed    Component Value Date/Time   NA 139 10/01/2017 1016   NA 139 03/14/2015 0856   K 4.4 10/01/2017 1016   K 4.1 03/14/2015 0856   CL 102 10/01/2017 1016   CL 103 03/14/2015 0856   CO2 29 10/01/2017 1016   CO2 28 03/14/2015 0856   GLUCOSE 113 (H) 10/01/2017 1016   GLUCOSE 111 (H) 03/14/2015 0856   BUN 14 10/01/2017 1016   BUN 19 03/14/2015 0856   CREATININE 0.62 10/01/2017 1016   CREATININE 0.56 03/14/2015 0856   CALCIUM 9.4 10/01/2017 1016   CALCIUM 9.3 03/14/2015 0856   PROT 7.8 10/01/2017 1016   PROT 7.7 03/14/2015 0856   ALBUMIN 4.1 10/01/2017 1016   ALBUMIN 4.3 03/14/2015 0856   AST 81 (H) 10/01/2017 1016   AST 80 (H) 03/14/2015 0856   ALT 75 (H) 10/01/2017 1016   ALT 87 (H) 03/14/2015 0856   ALKPHOS 158 (H) 10/01/2017 1016   ALKPHOS 164 (H) 03/14/2015 0856   BILITOT 0.9 10/01/2017 1016   BILITOT 0.8 03/14/2015 0856   GFRNONAA >60 10/01/2017 1016   GFRNONAA >60 03/14/2015 0856   GFRAA >60 10/01/2017 1016   GFRAA >60 03/14/2015 0856   No results found for: SPEP, UPEP  Lab Results  Component Value Date   WBC 6.7 10/01/2017   NEUTROABS 4.3 10/01/2017   HGB 13.9 10/01/2017   HCT 40.4 10/01/2017   MCV 93.6 10/01/2017   PLT 188 10/01/2017     Chemistry      Component Value Date/Time   NA 139 10/01/2017 1016   NA 139 03/14/2015 0856   K 4.4 10/01/2017 1016   K 4.1 03/14/2015 0856   CL 102 10/01/2017 1016   CL 103 03/14/2015 0856   CO2 29 10/01/2017 1016   CO2 28 03/14/2015 0856   BUN 14 10/01/2017 1016   BUN 19 03/14/2015 0856   CREATININE 0.62 10/01/2017 1016   CREATININE 0.56 03/14/2015 0856      Component Value Date/Time   CALCIUM 9.4 10/01/2017 1016   CALCIUM 9.3 03/14/2015 0856   ALKPHOS 158 (H) 10/01/2017 1016   ALKPHOS 164 (H) 03/14/2015 0856   AST 81 (H) 10/01/2017 1016   AST 80 (H) 03/14/2015 0856   ALT 75 (H) 10/01/2017 1016   ALT 87 (H) 03/14/2015 0856   BILITOT 0.9 10/01/2017 1016   BILITOT 0.8  03/14/2015 0856      RADIOGRAPHIC STUDIES: I have personally reviewed the radiological images as listed and agreed with the findings in the report. No results found.   ASSESSMENT & PLAN:  Cancer of upper lobe of left lung ([place]) # LUL lung ca- stage I; clinically no evidence of recurrence.   #  DLBCL s/p chemo; clinically NED. Likely cured; no imaging needed.   # slightly elevated LFts-? Fatty liver; better/stable.   # GERD- Stable on Protonix. Discussed f/u w/ GI for poss endoscopy. Pt wishes to schedule.  # Hypertension- well controlled  # follow up in 6 months/CT chest scan prior/labs.    No orders of the defined types were placed in this encounter.  All questions were answered. The patient knows to call the clinic with any problems, questions or concerns.   Beckey Rutter, DNP AGNP-C 10/07/2017 9:58 AM    Verlon Au, NP 10/07/2017 10:00 AM

## 2017-10-07 NOTE — Assessment & Plan Note (Addendum)
#   LUL lung ca- stage I; clinically no evidence of recurrence.   # DLBCL s/p chemo; clinically NED. Likely cured; no imaging needed.   # slightly elevated LFts-? Fatty liver; better/stable.   # GERD- Stable on Protonix. Discussed f/u w/ GI for poss endoscopy. Pt wishes to schedule.  # Hypertension- well controlled  # follow up in 6 months/CT chest scan prior/labs.

## 2018-02-24 ENCOUNTER — Telehealth: Payer: Self-pay | Admitting: *Deleted

## 2018-02-24 ENCOUNTER — Ambulatory Visit
Admission: RE | Admit: 2018-02-24 | Discharge: 2018-02-24 | Disposition: A | Discharge status: Home / Self Care | Payer: Medicare Other | Source: Ambulatory Visit | Attending: Nurse Practitioner | Admitting: Nurse Practitioner

## 2018-02-24 DIAGNOSIS — Z902 Acquired absence of lung [part of]: Secondary | ICD-10-CM | POA: Insufficient documentation

## 2018-02-24 DIAGNOSIS — I2510 Atherosclerotic heart disease of native coronary artery without angina pectoris: Secondary | ICD-10-CM | POA: Diagnosis not present

## 2018-02-24 DIAGNOSIS — C3412 Malignant neoplasm of upper lobe, left bronchus or lung: Secondary | ICD-10-CM | POA: Insufficient documentation

## 2018-02-24 DIAGNOSIS — I700 Atherosclerosis of aorta: Secondary | ICD-10-CM | POA: Insufficient documentation

## 2018-02-24 LAB — POCT I-STAT CREATININE: Creatinine, Ser: 0.6 mg/dL (ref 0.44–1.00)

## 2018-02-24 MED ORDER — IOPAMIDOL (ISOVUE-300) INJECTION 61%
75.0000 mL | Freq: Once | INTRAVENOUS | Status: AC | PRN
  Administered 2018-02-24: 75 mL via INTRAVENOUS

## 2018-02-24 NOTE — Telephone Encounter (Signed)
Pt wanted to get results of her scan. Told her it was stable with her renal cyst, fatty liver and atherosclerosis.  The cancer did not show any signs of recurrence. She wanted a copy and I sent it in the mail per her request

## 2018-02-24 NOTE — Telephone Encounter (Signed)
-----   Message from Secundino Ginger sent at 02/24/2018 10:06 AM EDT ----- Regarding: call back Contact: [phone] Pt left a VM that she needed to talk to you. Please call pt.

## 2018-04-07 ENCOUNTER — Other Ambulatory Visit: Payer: Self-pay | Admitting: Internal Medicine

## 2018-04-07 ENCOUNTER — Inpatient Hospital Stay: Payer: Medicare Other | Attending: Internal Medicine

## 2018-04-07 ENCOUNTER — Other Ambulatory Visit: Payer: Self-pay

## 2018-04-07 ENCOUNTER — Inpatient Hospital Stay (HOSPITAL_BASED_OUTPATIENT_CLINIC_OR_DEPARTMENT_OTHER): Payer: Medicare Other | Admitting: Internal Medicine

## 2018-04-07 VITALS — BP 129/80 | HR 75 | Temp 97.8°F | Resp 18 | Wt 196.4 lb

## 2018-04-07 DIAGNOSIS — Z8572 Personal history of non-Hodgkin lymphomas: Secondary | ICD-10-CM | POA: Diagnosis not present

## 2018-04-07 DIAGNOSIS — C3412 Malignant neoplasm of upper lobe, left bronchus or lung: Secondary | ICD-10-CM

## 2018-04-07 DIAGNOSIS — Z85118 Personal history of other malignant neoplasm of bronchus and lung: Secondary | ICD-10-CM

## 2018-04-07 DIAGNOSIS — K219 Gastro-esophageal reflux disease without esophagitis: Secondary | ICD-10-CM

## 2018-04-07 DIAGNOSIS — I4891 Unspecified atrial fibrillation: Secondary | ICD-10-CM

## 2018-04-07 LAB — LACTATE DEHYDROGENASE: LDH: 179 U/L (ref 98–192)

## 2018-04-07 LAB — CBC WITH DIFFERENTIAL/PLATELET
BASOS ABS: 0.1 10*3/uL (ref 0–0.1)
BASOS PCT: 1 %
Eosinophils Absolute: 0.1 10*3/uL (ref 0–0.7)
Eosinophils Relative: 2 %
HEMATOCRIT: 39.3 % (ref 35.0–47.0)
HEMOGLOBIN: 13.7 g/dL (ref 12.0–16.0)
LYMPHS PCT: 25 %
Lymphs Abs: 1.7 10*3/uL (ref 1.0–3.6)
MCH: 32.6 pg (ref 26.0–34.0)
MCHC: 34.9 g/dL (ref 32.0–36.0)
MCV: 93.5 fL (ref 80.0–100.0)
Monocytes Absolute: 0.4 10*3/uL (ref 0.2–0.9)
Monocytes Relative: 6 %
Neutro Abs: 4.5 10*3/uL (ref 1.4–6.5)
Neutrophils Relative %: 66 %
Platelets: 197 10*3/uL (ref 150–440)
RBC: 4.2 MIL/uL (ref 3.80–5.20)
RDW: 13.7 % (ref 11.5–14.5)
WBC: 6.8 10*3/uL (ref 3.6–11.0)

## 2018-04-07 LAB — COMPREHENSIVE METABOLIC PANEL
ALT: 57 U/L — AB (ref 14–54)
AST: 62 U/L — AB (ref 15–41)
Albumin: 4.2 g/dL (ref 3.5–5.0)
Alkaline Phosphatase: 135 U/L — ABNORMAL HIGH (ref 38–126)
Anion gap: 10 (ref 5–15)
BUN: 14 mg/dL (ref 6–20)
CHLORIDE: 103 mmol/L (ref 101–111)
CO2: 27 mmol/L (ref 22–32)
CREATININE: 0.68 mg/dL (ref 0.44–1.00)
Calcium: 9.4 mg/dL (ref 8.9–10.3)
GFR calc Af Amer: >60 mL/min (ref >60)
Glucose, Bld: 144 mg/dL — ABNORMAL HIGH (ref 65–99)
Potassium: 4 mmol/L (ref 3.5–5.1)
Sodium: 140 mmol/L (ref 135–145)
Total Bilirubin: 0.8 mg/dL (ref 0.3–1.2)
Total Protein: 7.8 g/dL (ref 6.5–8.1)

## 2018-04-07 NOTE — Progress Notes (Signed)
[place] OFFICE PROGRESS NOTE  Patient Care Team: Idelle Crouch, MD as PCP - General (Internal Medicine)  Cancer Staging No matching staging information was found for the patient.   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 Biopsy is suggestive of fatty liver changes  # carcinoma of lung status post left upper lobe resection in July of 2014; Adenocarcinoma T1N0 M0 tumor EGFR positive     Cancer of upper lobe of left lung (HCC)     INTERVAL HISTORY:  Julie Jennings [age] y.o.  female pleasant patient above history of Diffuse large cell lymphoma and also history of stage I lung cancer is here for follow-up.  Patient states that she has been trying to lose weight.  She has lost about 8 pounds in the last 1 year.  Denies any unusual fatigue or night sweats.  Denies any fevers.  Patient denies any new shortness of breath or cough. Denies any lumps or bumps. Denies any chest pain or hemoptysis.  Patient is awaiting to have an upper endoscopy for long-standing reflux disease next week.  REVIEW OF SYSTEMS:  A complete 10 point review of system is done which is negative except mentioned above/history of present illness.   PAST MEDICAL HISTORY :  Past Medical History:  Diagnosis Date  . A-fib ([place])    Only once  . BP (high blood pressure) 04/16/2014  . Endometriosis   . Lung cancer ([place]) left  . Lymphoma ([place])    "stomach"    PAST SURGICAL HISTORY :   Past Surgical History:  Procedure Laterality Date  . AUGMENTATION MAMMAPLASTY Bilateral   . DILATION AND CURETTAGE OF UTERUS    . LUNG REMOVAL, PARTIAL Left     FAMILY HISTORY :   Family History  Problem Relation Age of Onset  . Stroke Mother   . Diabetes Mother   . Colon cancer Father   . Prostate cancer Father     SOCIAL HISTORY:   Social History   Tobacco Use  . Smoking status: Never Smoker  . Smokeless tobacco: Never Used  Substance  Use Topics  . Alcohol use: No  . Drug use: No    ALLERGIES:  is allergic to buspirone and codeine.  MEDICATIONS:  Current Outpatient Medications  Medication Sig Dispense Refill  . ALPRAZolam (XANAX) 0.5 MG tablet Take 1 tablet (0.5 mg total) by mouth 3 (three) times daily as needed for anxiety. 21 tablet 0  . aspirin 81 MG chewable tablet Chew by mouth.    . furosemide (LASIX) 40 MG tablet Take by mouth.    . losartan (COZAAR) 50 MG tablet     . meloxicam (MOBIC) 15 MG tablet Take by mouth.    . nebivolol (BYSTOLIC) 10 MG tablet Take by mouth.    . pantoprazole (PROTONIX) 40 MG tablet Take by mouth.    . tiotropium (SPIRIVA) 18 MCG inhalation capsule Place into inhaler and inhale.    . triamcinolone (NASACORT ALLERGY 24HR) 55 MCG/ACT AERO nasal inhaler Place 2 sprays into the nose daily.    Marland Kitchen venlafaxine XR (EFFEXOR-XR) 75 MG 24 hr capsule Take by mouth.    . Cholecalciferol (D 2000) 2000 UNITS TABS Take by mouth.    . diclofenac sodium (VOLTAREN) 1 % GEL Apply topically.    . gabapentin (NEURONTIN) 100 MG capsule Take 100 mg by mouth 3 (three) times daily.    Marland Kitchen loratadine (CLARITIN)  10 MG tablet Take 1 tablet (10 mg total) by mouth daily. (Patient not taking: Reported on 04/07/2018) 90 tablet 3   No current facility-administered medications for this visit.     PHYSICAL EXAMINATION: ECOG PERFORMANCE STATUS: 0 - Asymptomatic  BP 129/80 (BP Location: Left Arm, Patient Position: Sitting)   Pulse 75   Temp 97.8 F (36.6 C) (Tympanic)   Resp 18   Wt 196 lb 6.4 oz (89.1 kg)   BMI 31.70 kg/m   Filed Weights   04/07/18 1143  Weight: 196 lb 6.4 oz (89.1 kg)    GENERAL: Well-nourished well-developed; Alert, no distress and comfortable.  She is alone. EYES: no pallor or icterus OROPHARYNX: no thrush or ulceration; good dentition  NECK: supple, no masses felt LYMPH:  no palpable lymphadenopathy in the cervical, axillary or inguinal regions LUNGS: clear to auscultation and  No  wheeze or crackles HEART/CVS: regular rate & rhythm and no murmurs; No lower extremity edema ABDOMEN:abdomen soft, non-tender and normal bowel sounds Musculoskeletal:no cyanosis of digits and no clubbing  PSYCH: alert & oriented x 3 with fluent speech NEURO: no focal motor/sensory deficits SKIN:  no rashes or significant lesions  LABORATORY DATA:  I have reviewed the data as listed    Component Value Date/Time   NA 140 04/07/2018 1115   NA 139 03/14/2015 0856   K 4.0 04/07/2018 1115   K 4.1 03/14/2015 0856   CL 103 04/07/2018 1115   CL 103 03/14/2015 0856   CO2 27 04/07/2018 1115   CO2 28 03/14/2015 0856   GLUCOSE 144 (H) 04/07/2018 1115   GLUCOSE 111 (H) 03/14/2015 0856   BUN 14 04/07/2018 1115   BUN 19 03/14/2015 0856   CREATININE 0.68 04/07/2018 1115   CREATININE 0.56 03/14/2015 0856   CALCIUM 9.4 04/07/2018 1115   CALCIUM 9.3 03/14/2015 0856   PROT 7.8 04/07/2018 1115   PROT 7.7 03/14/2015 0856   ALBUMIN 4.2 04/07/2018 1115   ALBUMIN 4.3 03/14/2015 0856   AST 62 (H) 04/07/2018 1115   AST 80 (H) 03/14/2015 0856   ALT 57 (H) 04/07/2018 1115   ALT 87 (H) 03/14/2015 0856   ALKPHOS 135 (H) 04/07/2018 1115   ALKPHOS 164 (H) 03/14/2015 0856   BILITOT 0.8 04/07/2018 1115   BILITOT 0.8 03/14/2015 0856   GFRNONAA >60 04/07/2018 1115   GFRNONAA >60 03/14/2015 0856   GFRAA >60 04/07/2018 1115   GFRAA >60 03/14/2015 0856    No results found for: SPEP, UPEP  Lab Results  Component Value Date   WBC 6.8 04/07/2018   NEUTROABS 4.5 04/07/2018   HGB 13.7 04/07/2018   HCT 39.3 04/07/2018   MCV 93.5 04/07/2018   PLT 197 04/07/2018      Chemistry      Component Value Date/Time   NA 140 04/07/2018 1115   NA 139 03/14/2015 0856   K 4.0 04/07/2018 1115   K 4.1 03/14/2015 0856   CL 103 04/07/2018 1115   CL 103 03/14/2015 0856   CO2 27 04/07/2018 1115   CO2 28 03/14/2015 0856   BUN 14 04/07/2018 1115   BUN 19 03/14/2015 0856   CREATININE 0.68 04/07/2018 1115    CREATININE 0.56 03/14/2015 0856      Component Value Date/Time   CALCIUM 9.4 04/07/2018 1115   CALCIUM 9.3 03/14/2015 0856   ALKPHOS 135 (H) 04/07/2018 1115   ALKPHOS 164 (H) 03/14/2015 0856   AST 62 (H) 04/07/2018 1115   AST 80 (H) 03/14/2015 5093  ALT 57 (H) 04/07/2018 1115   ALT 87 (H) 03/14/2015 0856   BILITOT 0.8 04/07/2018 1115   BILITOT 0.8 03/14/2015 0856       RADIOGRAPHIC STUDIES: I have personally reviewed the radiological images as listed and agreed with the findings in the report. No results found.   ASSESSMENT & PLAN:  Cancer of upper lobe of left lung ([place]) # LUL lung ca- stage I [5456 June]; clinically no evidence of recurrence. Recent CT scan March 2019- NED; likely cured; will stop imaging at this time.   # DLBCL s/p chemo; clinically NED. Likely cured; no imaging needed.   # slightly elevated LFts-? Fatty liver; better/improved; weight loss- intentional.   # GERD- Stable on Protonix; awaiting EGD next week/may 6th [Dr.Elliot]  # follow up in  12 months/labs.   # I reviewed the blood work- with the patient in detail; also reviewed the imaging independently [as summarized above]; and with the patient in detail.      No orders of the defined types were placed in this encounter.  All questions were answered. The patient knows to call the clinic with any problems, questions or concerns.      Cammie Sickle, MD 04/07/2018 1:15 PM

## 2018-04-07 NOTE — Progress Notes (Signed)
Here for follow up. Stated doing well-and very active at home.

## 2018-04-07 NOTE — Assessment & Plan Note (Addendum)
#   LUL lung ca- stage I [2014 June]; clinically no evidence of recurrence. Recent CT scan March 2019- NED; likely cured; will stop imaging at this time.   # DLBCL s/p chemo; clinically NED. Likely cured; no imaging needed.   # slightly elevated LFts-? Fatty liver; better/improved; weight loss- intentional.   # GERD- Stable on Protonix; awaiting EGD next week/may 6th [Dr.Elliot]  # follow up in  12 months/labs.   # I reviewed the blood work- with the patient in detail; also reviewed the imaging independently [as summarized above]; and with the patient in detail.   # 25 minutes face-to-face with the patient discussing the above plan of care; more than 50% of time spent on prognosis/ natural history; counseling and coordination.

## 2018-04-11 ENCOUNTER — Encounter: Payer: Self-pay | Admitting: *Deleted

## 2018-04-14 ENCOUNTER — Ambulatory Visit
Admission: RE | Admit: 2018-04-14 | Discharge: 2018-04-14 | Disposition: A | Discharge status: Home / Self Care | Payer: Medicare Other | Source: Ambulatory Visit | Attending: Unknown Physician Specialty | Admitting: Unknown Physician Specialty

## 2018-04-14 ENCOUNTER — Ambulatory Visit: Payer: Medicare Other | Admitting: Anesthesiology

## 2018-04-14 ENCOUNTER — Encounter
Admission: RE | Disposition: A | Discharge status: Home / Self Care | Payer: Self-pay | Source: Ambulatory Visit | Attending: Unknown Physician Specialty

## 2018-04-14 ENCOUNTER — Encounter: Payer: Self-pay | Admitting: *Deleted

## 2018-04-14 DIAGNOSIS — Z800 Family history of malignant neoplasm of digestive organs: Secondary | ICD-10-CM | POA: Insufficient documentation

## 2018-04-14 DIAGNOSIS — K449 Diaphragmatic hernia without obstruction or gangrene: Secondary | ICD-10-CM | POA: Insufficient documentation

## 2018-04-14 DIAGNOSIS — Z8673 Personal history of transient ischemic attack (TIA), and cerebral infarction without residual deficits: Secondary | ICD-10-CM | POA: Diagnosis not present

## 2018-04-14 DIAGNOSIS — F419 Anxiety disorder, unspecified: Secondary | ICD-10-CM | POA: Insufficient documentation

## 2018-04-14 DIAGNOSIS — Z79899 Other long term (current) drug therapy: Secondary | ICD-10-CM | POA: Diagnosis not present

## 2018-04-14 DIAGNOSIS — Z888 Allergy status to other drugs, medicaments and biological substances status: Secondary | ICD-10-CM | POA: Insufficient documentation

## 2018-04-14 DIAGNOSIS — Z85118 Personal history of other malignant neoplasm of bronchus and lung: Secondary | ICD-10-CM | POA: Insufficient documentation

## 2018-04-14 DIAGNOSIS — Z1211 Encounter for screening for malignant neoplasm of colon: Secondary | ICD-10-CM | POA: Diagnosis present

## 2018-04-14 DIAGNOSIS — Z885 Allergy status to narcotic agent status: Secondary | ICD-10-CM | POA: Diagnosis not present

## 2018-04-14 DIAGNOSIS — Z7982 Long term (current) use of aspirin: Secondary | ICD-10-CM | POA: Insufficient documentation

## 2018-04-14 DIAGNOSIS — Q438 Other specified congenital malformations of intestine: Secondary | ICD-10-CM | POA: Insufficient documentation

## 2018-04-14 DIAGNOSIS — K621 Rectal polyp: Secondary | ICD-10-CM | POA: Diagnosis not present

## 2018-04-14 DIAGNOSIS — M1990 Unspecified osteoarthritis, unspecified site: Secondary | ICD-10-CM | POA: Insufficient documentation

## 2018-04-14 DIAGNOSIS — I10 Essential (primary) hypertension: Secondary | ICD-10-CM | POA: Insufficient documentation

## 2018-04-14 DIAGNOSIS — I4891 Unspecified atrial fibrillation: Secondary | ICD-10-CM | POA: Diagnosis not present

## 2018-04-14 DIAGNOSIS — Z8572 Personal history of non-Hodgkin lymphomas: Secondary | ICD-10-CM | POA: Diagnosis not present

## 2018-04-14 HISTORY — DX: Depression, unspecified: F32.A

## 2018-04-14 HISTORY — DX: Chronic kidney disease, unspecified: N18.9

## 2018-04-14 HISTORY — DX: Anxiety disorder, unspecified: F41.9

## 2018-04-14 HISTORY — DX: Unspecified osteoarthritis, unspecified site: M19.90

## 2018-04-14 HISTORY — PX: COLONOSCOPY WITH PROPOFOL: SHX5780

## 2018-04-14 HISTORY — DX: Zoster without complications: B02.9

## 2018-04-14 HISTORY — DX: Cerebral infarction, unspecified: I63.9

## 2018-04-14 HISTORY — DX: Major depressive disorder, single episode, unspecified: F32.9

## 2018-04-14 HISTORY — DX: Cardiac arrhythmia, unspecified: I49.9

## 2018-04-14 LAB — GLUCOSE, CAPILLARY: GLUCOSE-CAPILLARY: 85 mg/dL (ref 65–99)

## 2018-04-14 SURGERY — COLONOSCOPY WITH PROPOFOL
Anesthesia: General

## 2018-04-14 MED ORDER — LIDOCAINE HCL (PF) 2 % IJ SOLN
INTRAMUSCULAR | Status: DC | PRN
  Administered 2018-04-14: 80 mg

## 2018-04-14 MED ORDER — PROPOFOL 500 MG/50ML IV EMUL
INTRAVENOUS | Status: DC | PRN
  Administered 2018-04-14: 50 ug/kg/min via INTRAVENOUS

## 2018-04-14 MED ORDER — LIDOCAINE HCL (PF) 2 % IJ SOLN
INTRAMUSCULAR | Status: AC
  Filled 2018-04-14: qty 10

## 2018-04-14 MED ORDER — FENTANYL CITRATE (PF) 100 MCG/2ML IJ SOLN
INTRAMUSCULAR | Status: DC | PRN
  Administered 2018-04-14 (×4): 25 ug via INTRAVENOUS

## 2018-04-14 MED ORDER — PROPOFOL 10 MG/ML IV BOLUS
INTRAVENOUS | Status: DC | PRN
  Administered 2018-04-14 (×2): 20 mg via INTRAVENOUS
  Administered 2018-04-14: 30 mg via INTRAVENOUS
  Administered 2018-04-14: 20 mg via INTRAVENOUS

## 2018-04-14 MED ORDER — PROPOFOL 500 MG/50ML IV EMUL
INTRAVENOUS | Status: AC
  Filled 2018-04-14: qty 50

## 2018-04-14 MED ORDER — SODIUM CHLORIDE 0.9 % IV SOLN: INTRAVENOUS | Status: DC

## 2018-04-14 MED ORDER — GLYCOPYRROLATE 0.2 MG/ML IJ SOLN
INTRAMUSCULAR | Status: DC | PRN
  Administered 2018-04-14: 0.2 mg via INTRAVENOUS

## 2018-04-14 MED ORDER — SODIUM CHLORIDE 0.9 % IV SOLN
INTRAVENOUS | Status: DC
  Administered 2018-04-14 (×2): via INTRAVENOUS

## 2018-04-14 MED ORDER — PHENYLEPHRINE HCL 10 MG/ML IJ SOLN
INTRAMUSCULAR | Status: DC | PRN
  Administered 2018-04-14 (×2): 100 ug via INTRAVENOUS

## 2018-04-14 MED ORDER — EPHEDRINE SULFATE 50 MG/ML IJ SOLN
INTRAMUSCULAR | Status: DC | PRN
  Administered 2018-04-14 (×2): 15 mg via INTRAVENOUS
  Administered 2018-04-14 (×2): 10 mg via INTRAVENOUS

## 2018-04-14 MED ORDER — MIDAZOLAM HCL 2 MG/2ML IJ SOLN
INTRAMUSCULAR | Status: AC
  Filled 2018-04-14: qty 2

## 2018-04-14 MED ORDER — FENTANYL CITRATE (PF) 100 MCG/2ML IJ SOLN
INTRAMUSCULAR | Status: AC
  Filled 2018-04-14: qty 2

## 2018-04-14 MED ORDER — MIDAZOLAM HCL 5 MG/5ML IJ SOLN
INTRAMUSCULAR | Status: DC | PRN
  Administered 2018-04-14: 2 mg via INTRAVENOUS

## 2018-04-14 NOTE — Anesthesia Post-op Follow-up Note (Signed)
Anesthesia QCDR form completed.        

## 2018-04-14 NOTE — Anesthesia Postprocedure Evaluation (Signed)
Anesthesia Post Note  Patient: Julie Jennings  Procedure(s) Performed: COLONOSCOPY WITH PROPOFOL (N/A )  Patient location during evaluation: PACU Anesthesia Type: General Level of consciousness: awake and alert Pain management: pain level controlled Vital Signs Assessment: post-procedure vital signs reviewed and stable Respiratory status: spontaneous breathing, nonlabored ventilation, respiratory function stable and patient connected to nasal cannula oxygen Cardiovascular status: blood pressure returned to baseline and stable Postop Assessment: no apparent nausea or vomiting Anesthetic complications: no     Last Vitals:  Vitals:   04/14/18 1220 04/14/18 1345  BP: (!) 145/95 93/65  Pulse: 60 83  Resp: 18 14  Temp: (!) 35.7 C (!) 36.3 C  SpO2: 98% 99%    Last Pain:  Vitals:   04/14/18 1345  TempSrc: Tympanic  PainSc: 0-No pain                 Molli Barrows

## 2018-04-14 NOTE — Op Note (Signed)
[facility] Patient Name: Julie Jennings Procedure Date: 04/14/2018 12:56 PM MRN: [id number] Account #: [id number] Date of Birth: 12/06/1951 Admit Type: Outpatient Age: 67 Room: [facility] ENDO ROOM 1 Gender: Female Note Status: Finalized Procedure:            Upper GI endoscopy Indications:          Follow up gastric lymphoma Providers:            Manya Silvas, MD Referring MD:         Leonie Douglas. Doy Hutching, MD (Referring MD) Medicines:            Propofol per Anesthesia Complications:        No immediate complications. Procedure:            Pre-Anesthesia Assessment:                       - After reviewing the risks and benefits, the patient                        was deemed in satisfactory condition to undergo the                        procedure.                       After obtaining informed consent, the endoscope was                        passed under direct vision. Throughout the procedure,                        the patient's blood pressure, pulse, and oxygen                        saturations were monitored continuously. The Endoscope                        was introduced through the mouth, and advanced to the                        second part of duodenum. The upper GI endoscopy was                        accomplished without difficulty. The patient tolerated                        the procedure well. Findings:      The examined esophagus was normal. Small hiatal hernia.      The stomach was otherwise. normal.      The examined duodenum was normal. Impression:           - Normal esophagus.                       - Normal stomach.                       - Normal examined duodenum.                       - No specimens collected. Recommendation:       - Perform a colonoscopy as previously  scheduled. Manya Silvas, MD 04/14/2018 1:10:51 PM This report has been signed electronically. Number of Addenda: 0 Note Initiated On: 04/14/2018 12:56 PM   [facility]

## 2018-04-14 NOTE — Anesthesia Preprocedure Evaluation (Signed)
Anesthesia Evaluation  Patient identified by MRN, date of birth, ID band Patient awake    Reviewed: Allergy & Precautions, H&P , NPO status , Patient's Chart, lab work & pertinent test results, reviewed documented beta blocker date and time   Airway Mallampati: II   Neck ROM: full    Dental  (+) Poor Dentition, Teeth Intact   Pulmonary neg pulmonary ROS,    Pulmonary exam normal        Cardiovascular Exercise Tolerance: Good hypertension, negative cardio ROS Normal cardiovascular exam+ dysrhythmias Atrial Fibrillation  Rhythm:regular Rate:Normal     Neuro/Psych CVA, No Residual Symptoms negative neurological ROS  negative psych ROS   GI/Hepatic negative GI ROS, Neg liver ROS,   Endo/Other  negative endocrine ROS  Renal/GU Renal diseasenegative Renal ROS  negative genitourinary   Musculoskeletal   Abdominal   Peds  Hematology negative hematology ROS (+)   Anesthesia Other Findings Past Medical History: No date: A-fib [facility])     Comment:  Only once No date: Anxiety No date: Arthritis     Comment:  oesteoarthritis 04/16/2014: BP (high blood pressure) No date: Chronic kidney disease     Comment:  history nephrolithiasis No date: Depression No date: Dysrhythmia     Comment:  PSVT No date: Endometriosis No date: Herpes zoster left: Lung cancer (HCC) No date: Lymphoma ([place])     Comment:  "stomach" No date: Stroke [facility]) Past Surgical History: No date: AUGMENTATION MAMMAPLASTY; Bilateral No date: CHOLECYSTECTOMY No date: COLONOSCOPY No date: DILATION AND CURETTAGE OF UTERUS No date: HEMORRHOIDECTOMY WITH HEMORRHOID BANDING No date: HERNIA REPAIR No date: LUNG REMOVAL, PARTIAL; Left   Reproductive/Obstetrics negative OB ROS                             Anesthesia Physical Anesthesia Plan  ASA: III  Anesthesia Plan: General   Post-op Pain Management:    Induction:   PONV Risk  Score and Plan:   Airway Management Planned:   Additional Equipment:   Intra-op Plan:   Post-operative Plan:   Informed Consent: I have reviewed the patients History and Physical, chart, labs and discussed the procedure including the risks, benefits and alternatives for the proposed anesthesia with the patient or authorized representative who has indicated his/her understanding and acceptance.   Dental Advisory Given  Plan Discussed with: CRNA  Anesthesia Plan Comments:         Anesthesia Quick Evaluation

## 2018-04-14 NOTE — Op Note (Signed)
[facility] Patient Name: Julie Jennings Procedure Date: 04/14/2018 12:04 PM MRN: [id number] Account #: [id number] Date of Birth: 1951-09-15 Admit Type: Outpatient Age: 67 Room: [facility] ENDO ROOM 1 Gender: Female Note Status: Finalized Procedure:            Colonoscopy Indications:          Family history of colon cancer in a first-degree                        relative Providers:            Manya Silvas, MD Referring MD:         Leonie Douglas. Doy Hutching, MD (Referring MD) Medicines:            Propofol per Anesthesia Complications:        No immediate complications. Procedure:            Pre-Anesthesia Assessment:                       - After reviewing the risks and benefits, the patient                        was deemed in satisfactory condition to undergo the                        procedure.                       After obtaining informed consent, the colonoscope was                        passed under direct vision. Throughout the procedure,                        the patient's blood pressure, pulse, and oxygen                        saturations were monitored continuously. The                        Colonoscope was introduced through the anus and                        advanced to the the ileocecal valve. The colonoscopy                        was performed with difficulty due to a redundant colon,                        significant looping and a tortuous colon. Successful                        completion of the procedure was aided by applying                        abdominal pressure. The patient tolerated the procedure                        well. The quality of the bowel preparation was  excellent. Findings:      A diminutive polyp was found in the rectum. The polyp was sessile. The       polyp was removed with a jumbo cold forceps. Resection and retrieval       were complete. The colon was extremely long and redundant,  making       passage difficult. I was able to reach the proximal ascending colon and       see the ileocecal valve but not the cecum behind it. The mucosa looked       good, no other polyps seen. different positions tried and external abd       pressure. Impression:           - One diminutive polyp in the rectum, removed with a                        jumbo cold forceps. Resected and retrieved. Recommendation:       - Await pathology results. Manya Silvas, MD 04/14/2018 1:47:37 PM This report has been signed electronically. Number of Addenda: 0 Note Initiated On: 04/14/2018 12:04 PM Scope Withdrawal Time: 0 hours 11 minutes 14 seconds  Total Procedure Duration: 0 hours 28 minutes 50 seconds       [facility]

## 2018-04-14 NOTE — Transfer of Care (Signed)
Immediate Anesthesia Transfer of Care Note  Patient: Julie Jennings  Procedure(s) Performed: COLONOSCOPY WITH PROPOFOL (N/A )  Patient Location: PACU  Anesthesia Type:General  Level of Consciousness: sedated  Airway & Oxygen Therapy: Patient Spontanous Breathing  Post-op Assessment: Report given to RN and Post -op Vital signs reviewed and stable  Post vital signs: Reviewed, stable  Last Vitals:  Vitals Value Taken Time  BP 93/65 04/14/2018  1:46 PM  Temp 36.3 C 04/14/2018  1:45 PM  Pulse 83 04/14/2018  1:46 PM  Resp 16 04/14/2018  1:46 PM  SpO2 98 % 04/14/2018  1:46 PM  Vitals shown include unvalidated device data.  Last Pain:  Vitals:   04/14/18 1345  TempSrc: Tympanic         Complications: No apparent anesthesia complications

## 2018-04-14 NOTE — H&P (Signed)
Primary Care Physician:  Idelle Crouch, MD Primary Gastroenterologist:  Dr. Vira Agar  Pre-Procedure History & Physical: HPI:  Julie Jennings is a 67 y.o. female is here for an endoscopy and colonoscopy.Done for colon screening and [facility] in father.  Also F/U gastric lymphoma.   Past Medical History:  Diagnosis Date  . A-fib ([place])    Only once  . Anxiety   . Arthritis    oesteoarthritis  . BP (high blood pressure) 04/16/2014  . Chronic kidney disease    history nephrolithiasis  . Depression   . Dysrhythmia    PSVT  . Endometriosis   . Herpes zoster   . 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 Lymphoma ([place])    "stomach"  . Stroke [facility])     Past Surgical History:  Procedure Laterality Date  . AUGMENTATION MAMMAPLASTY Bilateral   . CHOLECYSTECTOMY    . COLONOSCOPY    . DILATION AND CURETTAGE OF UTERUS    . HEMORRHOIDECTOMY WITH HEMORRHOID BANDING    . HERNIA REPAIR    . LUNG REMOVAL, PARTIAL Left     Prior to Admission medications   Medication Sig Start Date End Date Taking? Authorizing Provider  ALPRAZolam Duanne Moron) 0.5 MG tablet Take 1 tablet (0.5 mg total) by mouth 3 (three) times daily as needed for anxiety. 10/02/16  Yes Creola Corn, MD  aspirin 81 MG chewable tablet Chew by mouth.   Yes [provider]  Cholecalciferol (D 2000) 2000 UNITS TABS Take by mouth.   Yes [provider]  diclofenac sodium (VOLTAREN) 1 % GEL Apply topically. 01/16/18 01/16/19 Yes [provider]  gabapentin (NEURONTIN) 100 MG capsule Take 100 mg by mouth 3 (three) times daily.   Yes [provider]  losartan (COZAAR) 50 MG tablet  04/13/16  Yes [provider]  meloxicam (MOBIC) 15 MG tablet Take by mouth.   Yes [provider]  nebivolol (BYSTOLIC) 10 MG tablet Take by mouth. 09/30/14  Yes [provider]  pantoprazole (PROTONIX) 40 MG tablet Take by mouth. 04/22/17 04/22/18 Yes [provider]  tiotropium (SPIRIVA) 18 MCG inhalation  capsule Place into inhaler and inhale. 09/30/14  Yes [provider]  triamcinolone (NASACORT ALLERGY 24HR) 55 MCG/ACT AERO nasal inhaler Place 2 sprays into the nose daily.   Yes [provider]  venlafaxine XR (EFFEXOR-XR) 75 MG 24 hr capsule Take by mouth. 07/25/15  Yes [provider]  furosemide (LASIX) 40 MG tablet Take by mouth. 09/30/14 04/07/18  [provider]  loratadine (CLARITIN) 10 MG tablet Take 1 tablet (10 mg total) by mouth daily. Patient not taking: Reported on 04/07/2018 12/17/16   Cammie Sickle, MD    Allergies as of 04/11/2018 - Review Complete 04/11/2018  Allergen Reaction Noted  . Buspirone Nausea And Vomiting 09/13/2015  . Codeine Nausea And Vomiting 03/12/2013    Family History  Problem Relation Age of Onset  . Stroke Mother   . Diabetes Mother   . Colon cancer Father   . Prostate cancer Father     Social History   Socioeconomic History  . Marital status: Married    Spouse name: Not on file  . Number of children: Not on file  . Years of education: Not on file  . Highest education level: Not on file  Occupational History  . Not on file  Social Needs  . Financial resource strain: Not on file  . Food insecurity:    Worry: Not on file  Inability: Not on file  . Transportation needs:    Medical: Not on file    Non-medical: Not on file  Tobacco Use  . Smoking status: Never Smoker  . Smokeless tobacco: Never Used  Substance and Sexual Activity  . Alcohol use: No  . Drug use: No  . Sexual activity: Yes    Birth control/protection: Post-menopausal  Lifestyle  . Physical activity:    Days per week: Not on file    Minutes per session: Not on file  . Stress: Not on file  Relationships  . Social connections:    Talks on phone: Not on file    Gets together: Not on file    Attends religious service: Not on file    Active member of club or organization: Not on file    Attends meetings of clubs or  organizations: Not on file    Relationship status: Not on file  . Intimate partner violence:    Fear of current or ex partner: Not on file    Emotionally abused: Not on file    Physically abused: Not on file    Forced sexual activity: Not on file  Other Topics Concern  . Not on file  Social History Narrative  . Not on file    Review of Systems: See HPI, otherwise negative ROS  Physical Exam: BP (!) 145/95   Pulse 60   Temp (!) 96.3 F (35.7 C) (Tympanic)   Resp 18   Ht 5\' 7"  (1.702 m)   Wt 88.5 kg (195 lb)   SpO2 98%   BMI 30.54 kg/m  General:   Alert,  pleasant and cooperative in NAD Head:  Normocephalic and atraumatic. Neck:  Supple; no masses or thyromegaly. Lungs:  Clear throughout to auscultation.    Heart:  Regular rate and rhythm. Abdomen:  Soft, nontender and nondistended. Normal bowel sounds, without guarding, and without rebound.   Neurologic:  Alert and  oriented x4;  grossly normal neurologically.  Impression/Plan: Julie Jennings is here for an endoscopy and colonoscopy to be performed for colon screening and [facility] colon cancer.also follow up gastric lymphoma.  Risks, benefits, limitations, and alternatives regarding  endoscopy and colonoscopy have been reviewed with the patient.  Questions have been answered.  All parties agreeable.   Gaylyn Cheers, MD  04/14/2018, 12:55 PM

## 2018-04-15 ENCOUNTER — Encounter: Payer: Self-pay | Admitting: Unknown Physician Specialty

## 2018-04-15 LAB — SURGICAL PATHOLOGY

## 2018-04-15 NOTE — OR Nursing (Signed)
C/O SNEEZING. NO TEMPERATURE. DR ELLIOTT NOTIFIED.

## 2018-06-02 ENCOUNTER — Other Ambulatory Visit: Payer: Self-pay | Admitting: Internal Medicine

## 2018-06-02 DIAGNOSIS — Z1231 Encounter for screening mammogram for malignant neoplasm of breast: Secondary | ICD-10-CM

## 2018-06-05 ENCOUNTER — Ambulatory Visit
Admission: RE | Admit: 2018-06-05 | Discharge: 2018-06-05 | Disposition: A | Discharge status: Home / Self Care | Payer: Medicare Other | Source: Ambulatory Visit | Attending: Internal Medicine | Admitting: Internal Medicine

## 2018-06-05 DIAGNOSIS — Z1231 Encounter for screening mammogram for malignant neoplasm of breast: Secondary | ICD-10-CM | POA: Diagnosis present

## 2018-06-11 ENCOUNTER — Ambulatory Visit: Payer: Medicare Other

## 2018-06-11 ENCOUNTER — Other Ambulatory Visit: Payer: Self-pay

## 2018-06-11 ENCOUNTER — Other Ambulatory Visit: Payer: Self-pay | Admitting: Oncology

## 2018-06-11 DIAGNOSIS — R300 Dysuria: Secondary | ICD-10-CM

## 2018-06-11 LAB — URINALYSIS, COMPLETE (UACMP) WITH MICROSCOPIC
BILIRUBIN URINE: NEGATIVE
Glucose, UA: NEGATIVE mg/dL
Hgb urine dipstick: NEGATIVE
Ketones, ur: NEGATIVE mg/dL
NITRITE: NEGATIVE
PH: 5 (ref 5.0–8.0)
Protein, ur: NEGATIVE mg/dL
SPECIFIC GRAVITY, URINE: 1.008 (ref 1.005–1.030)
SQUAMOUS EPITHELIAL / LPF: NONE SEEN (ref 0–5)

## 2018-06-11 MED ORDER — PHENAZOPYRIDINE HCL 200 MG PO TABS: 200.0000 mg | ORAL_TABLET | Freq: Three times a day (TID) | ORAL | 0 refills | Status: DC | PRN

## 2018-06-11 NOTE — Progress Notes (Signed)
Patient having burning with urination.  Recently treated for urinary tract infection by PCP.  We will collect a UA and a urinalysis. RX Pyridium 200 mg TID for burning.   Faythe Casa, NP 06/11/2018 11:55 AM

## 2018-06-13 ENCOUNTER — Telehealth: Payer: Self-pay | Admitting: *Deleted

## 2018-06-13 NOTE — Telephone Encounter (Signed)
Patient calling to inquire about final u/a culture results. I reviewed with patient that the culture has not yet resulted at this time. I will continue to review her chart for final results. Pt instructed to continue taking pyridium as directed by Sonia Baller, NP. Patient encouraged to drink plenty of fluids.

## 2018-07-29 ENCOUNTER — Telehealth: Payer: Self-pay | Admitting: *Deleted

## 2018-07-29 NOTE — Telephone Encounter (Signed)
Returned patient's phone call 1400. Patient may have shingrex vaccine (dead vaccine) only. She is not yet due for her pneumovax. She will be due for the pneumovac next year-Pt made aware.

## 2018-07-29 NOTE — Telephone Encounter (Signed)
The patient called to say that the pharmacy is sending her messages to get pneumonia and shingle vaccine. If she needs it she wants to go ahead and get it because the pharmacy is running out of supply. She thought she remembered that she was not suppose to get pneumonia shots any more but she may be wrong. She just wants you to call her back and let her know if she can take the 2 vaccines. Thanks

## 2018-07-31 ENCOUNTER — Encounter: Payer: Self-pay | Admitting: Obstetrics & Gynecology

## 2018-07-31 ENCOUNTER — Ambulatory Visit (INDEPENDENT_AMBULATORY_CARE_PROVIDER_SITE_OTHER): Payer: Medicare Other | Admitting: Obstetrics & Gynecology

## 2018-07-31 ENCOUNTER — Other Ambulatory Visit (HOSPITAL_COMMUNITY)
Admission: RE | Admit: 2018-07-31 | Discharge: 2018-07-31 | Disposition: A | Discharge status: Home / Self Care | Payer: Medicare Other | Source: Ambulatory Visit | Attending: Obstetrics & Gynecology | Admitting: Obstetrics & Gynecology

## 2018-07-31 VITALS — BP 140/90 | Ht 67.0 in | Wt 192.0 lb

## 2018-07-31 DIAGNOSIS — Z0000 Encounter for general adult medical examination without abnormal findings: Secondary | ICD-10-CM

## 2018-07-31 DIAGNOSIS — N950 Postmenopausal bleeding: Secondary | ICD-10-CM | POA: Diagnosis not present

## 2018-07-31 DIAGNOSIS — Z01411 Encounter for gynecological examination (general) (routine) with abnormal findings: Secondary | ICD-10-CM

## 2018-07-31 DIAGNOSIS — R300 Dysuria: Secondary | ICD-10-CM

## 2018-07-31 LAB — POCT URINALYSIS DIPSTICK
BILIRUBIN UA: NEGATIVE
Blood, UA: NEGATIVE
Glucose, UA: NEGATIVE
KETONES UA: NEGATIVE
Nitrite, UA: NEGATIVE
Protein, UA: NEGATIVE
Spec Grav, UA: 1.01 (ref 1.010–1.025)
Urobilinogen, UA: 1 E.U./dL
pH, UA: 5 (ref 5.0–8.0)

## 2018-07-31 MED ORDER — SULFAMETHOXAZOLE-TRIMETHOPRIM 800-160 MG PO TABS: 1.0000 | ORAL_TABLET | Freq: Two times a day (BID) | ORAL | 0 refills | Status: AC

## 2018-07-31 NOTE — Progress Notes (Signed)
HPI:      [name] is a 67 y.o. G0P0000 who LMP was in the past, she presents today for her annual examination.  The patient has had 2 episodes of vaginal bleeding, light, no associated pain or sx's. The patient is not sexually active. Herlast pap: approximate date 2018 and was normal and last mammogram: approximate date 05/2018 and was normal.  The patient does perform self breast exams.  There is no notable family history of breast or ovarian cancer in her family. The patient is not taking hormone replacement therapy. Patient reports post-menopausal vaginal bleeding. Bleeding is not cyclic. Bleeding is not associated with pain. Previous work up: none.   The patient has regular exercise: yes. The patient denies current symptoms of depression.    GYN Hx: Last Colonoscopy:1 year ago. Normal.  Last DEXA: several years ago.    PMHx: Past Medical History:  Diagnosis Date  . A-fib ([place])    Only once  . Anxiety   . Arthritis    oesteoarthritis  . BP (high blood pressure) 04/16/2014  . Chronic kidney disease    history nephrolithiasis  . Depression   . Dysrhythmia    PSVT  . Endometriosis   . Herpes zoster   . 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 Lymphoma ([place])    "stomach"  . Stroke [facility])    Past Surgical History:  Procedure Laterality Date  . AUGMENTATION MAMMAPLASTY Bilateral   . CHOLECYSTECTOMY    . COLONOSCOPY    . COLONOSCOPY WITH PROPOFOL N/A 04/14/2018   Procedure: COLONOSCOPY WITH PROPOFOL;  Surgeon: Manya Silvas, MD;  Location[facility] ENDOSCOPY;  Service: Endoscopy;  Laterality: N/A;  . DILATION AND CURETTAGE OF UTERUS    . HEMORRHOIDECTOMY WITH HEMORRHOID BANDING    . HERNIA REPAIR    . LUNG REMOVAL, PARTIAL Left    Family History  Problem Relation Age of Onset  . Stroke Mother   . Diabetes Mother   . Colon cancer Father   . Prostate cancer Father   . Breast cancer Neg Hx    Social History   Tobacco Use  . Smoking status: Never Smoker  . Smokeless tobacco: Never  Used  Substance Use Topics  . Alcohol use: No  . Drug use: No    Current Outpatient Medications:  .  ALPRAZolam (XANAX) 0.5 MG tablet, Take 1 tablet (0.5 mg total) by mouth 3 (three) times daily as needed for anxiety., Disp: 21 tablet, Rfl: 0 .  aspirin 81 MG chewable tablet, Chew by mouth., Disp: , Rfl:  .  Cholecalciferol (D 2000) 2000 UNITS TABS, Take by mouth., Disp: , Rfl:  .  losartan (COZAAR) 50 MG tablet, , Disp: , Rfl:  .  meloxicam (MOBIC) 15 MG tablet, Take by mouth., Disp: , Rfl:  .  nebivolol (BYSTOLIC) 10 MG tablet, Take by mouth., Disp: , Rfl:  .  phenazopyridine (PYRIDIUM) 200 MG tablet, Take 1 tablet (200 mg total) by mouth 3 (three) times daily as needed for pain., Disp: 20 tablet, Rfl: 0 .  tiotropium (SPIRIVA) 18 MCG inhalation capsule, Place into inhaler and inhale., Disp: , Rfl:  .  triamcinolone (NASACORT ALLERGY 24HR) 55 MCG/ACT AERO nasal inhaler, Place 2 sprays into the nose daily., Disp: , Rfl:  .  venlafaxine XR (EFFEXOR-XR) 75 MG 24 hr capsule, Take by mouth., Disp: , Rfl:  .  diclofenac sodium (VOLTAREN) 1 % GEL, Apply topically., Disp: , Rfl:  .  furosemide (LASIX) 40 MG tablet,  Take by mouth., Disp: , Rfl:  .  gabapentin (NEURONTIN) 100 MG capsule, Take 100 mg by mouth 3 (three) times daily., Disp: , Rfl:  .  loratadine (CLARITIN) 10 MG tablet, Take 1 tablet (10 mg total) by mouth daily. (Patient not taking: Reported on 07/31/2018), Disp: 90 tablet, Rfl: 3 .  pantoprazole (PROTONIX) 40 MG tablet, Take by mouth., Disp: , Rfl:  Allergies: Buspirone and Codeine  Review of Systems  Constitutional: Negative for chills, fever and malaise/fatigue.  HENT: Negative for congestion, sinus pain and sore throat.   Eyes: Negative for blurred vision and pain.  Respiratory: Negative for cough and wheezing.   Cardiovascular: Negative for chest pain and leg swelling.  Gastrointestinal: Negative for abdominal pain, constipation, diarrhea, heartburn, nausea and vomiting.    Genitourinary: Negative for dysuria, frequency, hematuria and urgency.  Musculoskeletal: Negative for back pain, joint pain, myalgias and neck pain.  Skin: Negative for itching and rash.  Neurological: Negative for dizziness, tremors and weakness.  Endo/Heme/Allergies: Does not bruise/bleed easily.  Psychiatric/Behavioral: Negative for depression. The patient is not nervous/anxious and does not have insomnia.     Objective: BP 140/90   Ht 5\' 7"  (1.702 m)   Wt 192 lb (87.1 kg)   BMI 30.07 kg/m   Filed Weights   07/31/18 0853  Weight: 192 lb (87.1 kg)   Body mass index is 30.07 kg/m. Physical Exam  Constitutional: She is oriented to person, place, and time. She appears well-developed and well-nourished. No distress.  Genitourinary: Rectum normal, vagina normal and uterus normal. Pelvic exam was performed with patient supine. There is no rash or lesion on the right labia. There is no rash or lesion on the left labia. Vagina exhibits no lesion. No bleeding in the vagina. Right adnexum does not display mass and does not display tenderness. Left adnexum does not display mass and does not display tenderness. Cervix does not exhibit motion tenderness, lesion, friability or polyp.   Uterus is mobile and midaxial. Uterus is not enlarged or exhibiting a mass.  HENT:  Head: Normocephalic and atraumatic. Head is without laceration.  Right Ear: Hearing normal.  Left Ear: Hearing normal.  Nose: No epistaxis.  No foreign bodies.  Mouth/Throat: Uvula is midline, oropharynx is clear and moist and mucous membranes are normal.  Eyes: Pupils are equal, round, and reactive to light.  Neck: Normal range of motion. Neck supple. No thyromegaly present.  Cardiovascular: Normal rate and regular rhythm. Exam reveals no gallop and no friction rub.  No murmur heard. Pulmonary/Chest: Effort normal and breath sounds normal. No respiratory distress. She has no wheezes. Right breast exhibits no mass, no skin  change and no tenderness. Left breast exhibits no mass, no skin change and no tenderness.  Abdominal: Soft. Bowel sounds are normal. She exhibits no distension. There is no tenderness. There is no rebound.  Musculoskeletal: Normal range of motion.  Neurological: She is alert and oriented to person, place, and time. No cranial nerve deficit.  Skin: Skin is warm and dry.  Psychiatric: She has a normal mood and affect. Judgment normal.  Vitals reviewed.  Assessment: Annual Exam 1. Annual physical exam   2. Post-menopausal bleeding   3. Dysuria     Plan:            1.  Cervical Screening-  Pap smear done today  2. Breast screening- Exam annually and mammogram scheduled  3. Colonoscopy every 10 years, Hemoccult testing after age 20  4. Labs managed by  PCP  5. Counseling for hormonal therapy: none  6. PMB- [place] to assess uterus.  Could be from fall, hemorrhoid, other.  7. UTI, tx w Bactrim  8. Plans Flu shot at PCP    F/U  Return in about 1 week (around 08/07/2018) for Follow up after GYN [place].  Barnett Applebaum, MD, [facility], [facility] 07/31/2018  9:26 AM

## 2018-07-31 NOTE — Patient Instructions (Addendum)
PAP every three years Mammogram every year    Call [phone] to schedule at [facility] Colonoscopy every 10 years Labs yearly (with PCP) Flu shot soon

## 2018-08-01 LAB — CYTOLOGY - PAP
Diagnosis: NEGATIVE
HPV: NOT DETECTED

## 2018-08-08 ENCOUNTER — Ambulatory Visit (INDEPENDENT_AMBULATORY_CARE_PROVIDER_SITE_OTHER): Payer: Medicare Other

## 2018-08-08 ENCOUNTER — Encounter: Payer: Self-pay | Admitting: Obstetrics & Gynecology

## 2018-08-08 ENCOUNTER — Ambulatory Visit (INDEPENDENT_AMBULATORY_CARE_PROVIDER_SITE_OTHER): Payer: Medicare Other | Admitting: Obstetrics & Gynecology

## 2018-08-08 VITALS — BP 138/80 | Ht 66.0 in | Wt 194.0 lb

## 2018-08-08 DIAGNOSIS — N950 Postmenopausal bleeding: Secondary | ICD-10-CM

## 2018-08-08 DIAGNOSIS — N83201 Unspecified ovarian cyst, right side: Secondary | ICD-10-CM

## 2018-08-08 NOTE — Progress Notes (Signed)
  HPI: Postmenopausal Bleeding Patient complains of vaginal bleeding. She has been menopausal for several years. Currently on no HRT. Bleeding is described as scant staining and has occurred 2 times. Other menopausal symptoms include: none. Workup to date: pelvic ultrasound.  Menstrual History: OB History    Gravida  0   Para  0   Term  0   Preterm  0   AB  0   Living  0     SAB  0   TAB  0   Ectopic  0   Multiple  0   Live Births  0         Ultrasound demonstrates no masses seen, ES 58mm, small ovarian cyst  PMHx: She  has a past medical history of A-fib ([place]), Anxiety, Arthritis, BP (high blood pressure) (04/16/2014), Chronic kidney disease, Depression, Dysrhythmia, Endometriosis, Herpes zoster, Lung cancer ([facility]) (left), Lymphoma ([place]), and Stroke ([place]). Also,  has a past surgical history that includes Lung removal, partial (Left); Dilation and curettage of uterus; Augmentation mammaplasty (Bilateral); Cholecystectomy; Colonoscopy; Hemorrhoidectomy with hemorrhoid banding; Hernia repair; and Colonoscopy with propofol (N/A, 04/14/2018)., family history includes Colon cancer in her father; Diabetes in her mother; Prostate cancer in her father; Stroke in her mother.,  reports that she has never smoked. She has never used smokeless tobacco. She reports that she does not drink alcohol or use drugs.  She has a current medication list which includes the following prescription(s): alprazolam, aspirin, cholecalciferol, diclofenac sodium, furosemide, gabapentin, loratadine, losartan, meloxicam, nebivolol, pantoprazole, phenazopyridine, tiotropium, triamcinolone, and venlafaxine xr. Also, is allergic to buspirone and codeine.  Review of Systems  All other systems reviewed and are negative.  Objective: BP 138/80   Ht 5\' 6"  (1.676 m)   Wt 194 lb (88 kg)   BMI 31.31 kg/m   Physical examination Constitutional NAD, Conversant  Skin No rashes, lesions or ulceration.   Extremities:  Moves all appropriately.  Normal ROM for age. No lymphadenopathy.  Neuro: Grossly intact  Psych: Oriented to PPT.  Normal mood. Normal affect.   Assessment:  Post-menopausal bleeding    No further bleeding, ES normal range    Monitor for recurrence of sx's    SIS vs hysteroscopy D&C if recurrent  Barnett Applebaum, MD, [facility], [facility] 08/08/2018  2:45 PM

## 2018-09-01 ENCOUNTER — Telehealth: Payer: Self-pay | Admitting: Obstetrics & Gynecology

## 2018-09-02 ENCOUNTER — Telehealth: Payer: Self-pay | Admitting: Obstetrics & Gynecology

## 2018-09-02 ENCOUNTER — Other Ambulatory Visit: Payer: Self-pay | Admitting: Obstetrics & Gynecology

## 2018-09-02 ENCOUNTER — Ambulatory Visit (INDEPENDENT_AMBULATORY_CARE_PROVIDER_SITE_OTHER): Payer: Medicare Other

## 2018-09-02 DIAGNOSIS — R300 Dysuria: Secondary | ICD-10-CM

## 2018-09-02 DIAGNOSIS — M545 Low back pain, unspecified: Secondary | ICD-10-CM

## 2018-09-02 DIAGNOSIS — R350 Frequency of micturition: Secondary | ICD-10-CM | POA: Diagnosis not present

## 2018-09-02 DIAGNOSIS — N3000 Acute cystitis without hematuria: Secondary | ICD-10-CM

## 2018-09-02 LAB — POCT URINALYSIS DIPSTICK
Bilirubin, UA: NEGATIVE
Blood, UA: NEGATIVE
GLUCOSE UA: NEGATIVE
Ketones, UA: NEGATIVE
Nitrite, UA: POSITIVE
PROTEIN UA: POSITIVE — AB
Spec Grav, UA: 1.01 (ref 1.010–1.025)
UROBILINOGEN UA: NEGATIVE U/dL — AB
pH, UA: 6 (ref 5.0–8.0)

## 2018-09-02 MED ORDER — SULFAMETHOXAZOLE-TRIMETHOPRIM 800-160 MG PO TABS: 1.0000 | ORAL_TABLET | Freq: Two times a day (BID) | ORAL | 0 refills | Status: AC

## 2018-09-02 NOTE — Telephone Encounter (Signed)
Patient is calling back at the pharmacy wondering when her medication would be called in. Patient aware Dr. Kenton Kingfisher has been seeing patients and will have her medication sent in as soon as he has a moment.

## 2018-09-02 NOTE — Telephone Encounter (Signed)
Patient is calling to follow up about the medication that Dr. Kenton Kingfisher was going to call in for her. Patient has contacted pharmacy multiple times to see if the medication has been called in since she left and it was not there. Please call patient and let her know when the prescription has been sent. Thank you!

## 2018-09-02 NOTE — Progress Notes (Signed)
UTI Sx's, pain and dysuria UA- pos     Results for orders placed or performed in visit on 09/02/18  POCT Urinalysis Dipstick  Result Value Ref Range   Color, UA Dark yellow    Clarity, UA Cloudy    Glucose, UA Negative Negative   Bilirubin, UA Negative    Ketones, UA Negative    Spec Grav, UA 1.010 1.010 - 1.025   Blood, UA negative    pH, UA 6.0 5.0 - 8.0   Protein, UA Positive (A) Negative   Urobilinogen, UA negative (A) 0.2 or 1.0 E.U./dL   Nitrite, UA Positive    Leukocytes, UA Moderate (2+) (A) Negative   Appearance Dark cloudy    Odor Strong    Bactrim Rx  Barnett Applebaum, MD, [facility], [facility] 09/02/2018  2:41 PM

## 2018-09-03 ENCOUNTER — Telehealth: Payer: Self-pay | Admitting: Obstetrics & Gynecology

## 2018-09-03 NOTE — Addendum Note (Signed)
Addended by: Raechel Chute on: 09/03/2018 01:51 PM   Modules accepted: Orders

## 2018-09-03 NOTE — Progress Notes (Signed)
Urine culture

## 2018-09-03 NOTE — Telephone Encounter (Signed)
Pt aware result are not back

## 2018-09-03 NOTE — Telephone Encounter (Signed)
Patient is calling to see if her lab culture is back . Please advise

## 2018-09-04 NOTE — Telephone Encounter (Signed)
Patient is calling to follow up on if her lab results have come in. Please advise

## 2018-09-05 ENCOUNTER — Other Ambulatory Visit: Payer: Self-pay | Admitting: Obstetrics & Gynecology

## 2018-09-05 LAB — URINE CULTURE

## 2018-09-05 MED ORDER — CIPROFLOXACIN HCL 500 MG PO TABS: 500.0000 mg | ORAL_TABLET | Freq: Two times a day (BID) | ORAL | 0 refills | Status: DC

## 2018-09-05 NOTE — Progress Notes (Signed)
UTI    Bactrim prescribed    Culture returns positive for resistance to this sulfa drug    Will change to [place], MD, [facility], [facility] 09/05/2018  12:00 PM

## 2018-09-05 NOTE — Progress Notes (Signed)
Let pt know, urine culture shows UTI resistant to Bactrim (the antibiotic she was taken initially) so I have sent new Rx for Cipro twice daily for 7 days to her pharmacy, start today

## 2018-09-09 DIAGNOSIS — M8589 Other specified disorders of bone density and structure, multiple sites: Secondary | ICD-10-CM | POA: Insufficient documentation

## 2018-10-23 IMAGING — MG MM  DIGITAL SCREENING BREAST BILAT IMPLANT W/ TOMO W/ CAD
8 of 17 series · 8 of 33 positions shown · non-contrast
Comparison: Previous exam(s).

ACR Breast Density Category a: The breast tissue is almost entirely
fatty.

CLINICAL DATA: Screening.

EXAM:
DIGITAL SCREENING BILATERAL MAMMOGRAM WITH IMPLANTS, CAD AND TOMO
The patient has retropectoral implants. Standard and implant
displaced views were performed.

[R MLO]
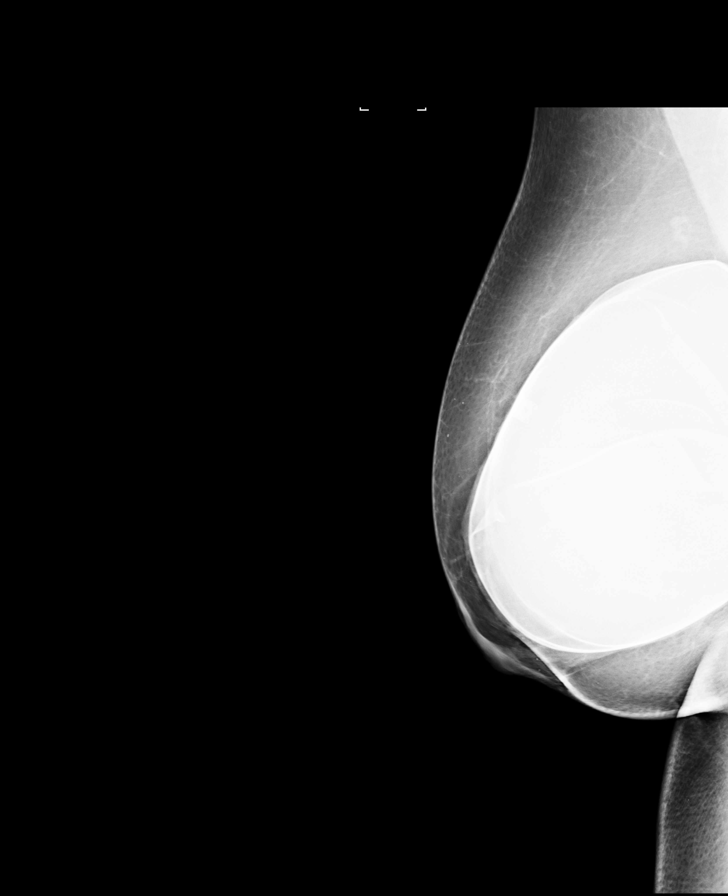

[L MLO (1 of 2)]
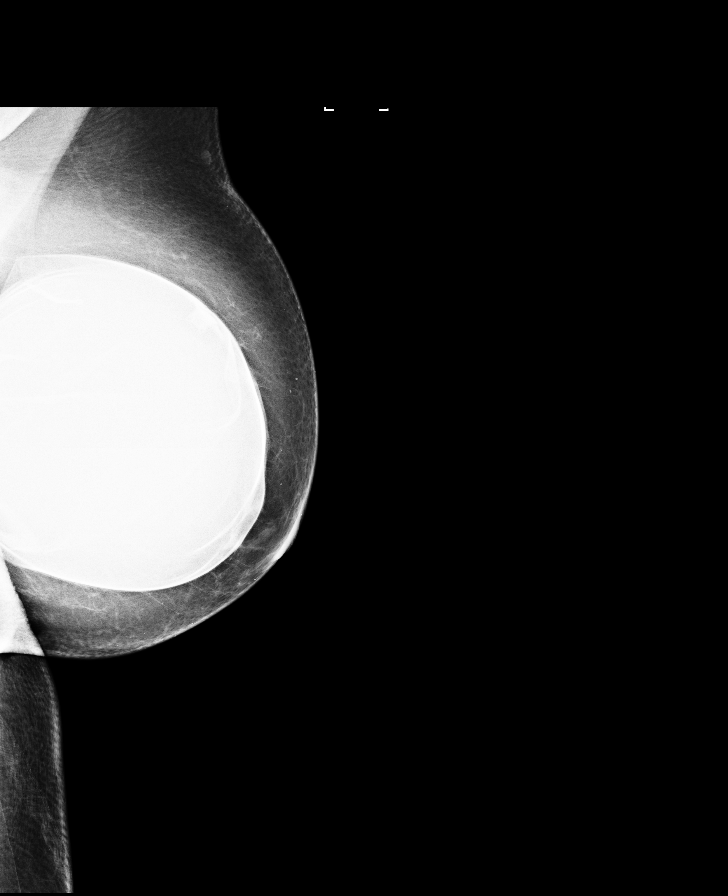

[R CC]
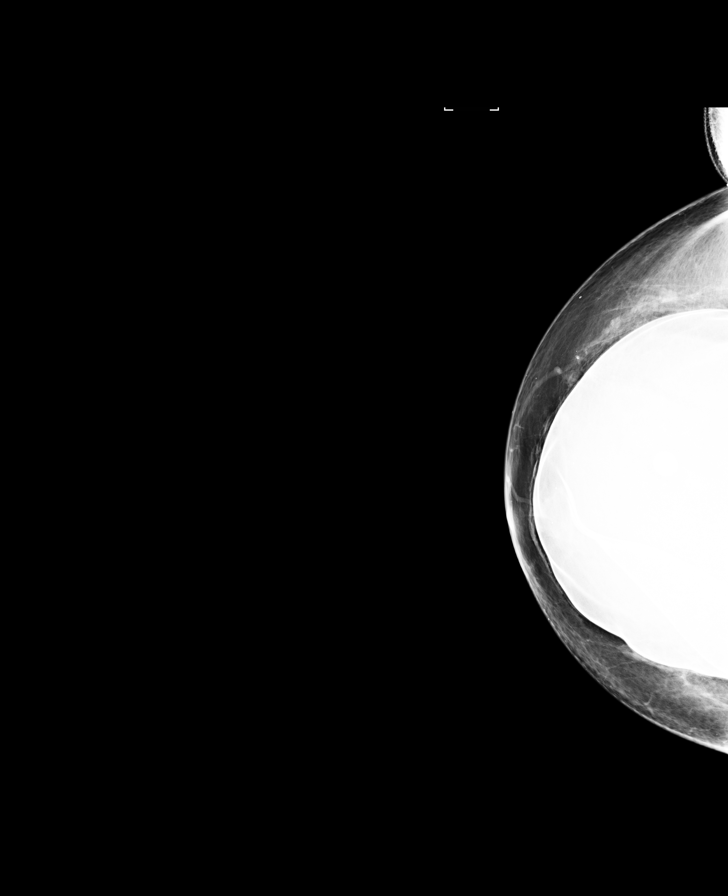

[L MLO (2 of 2)]
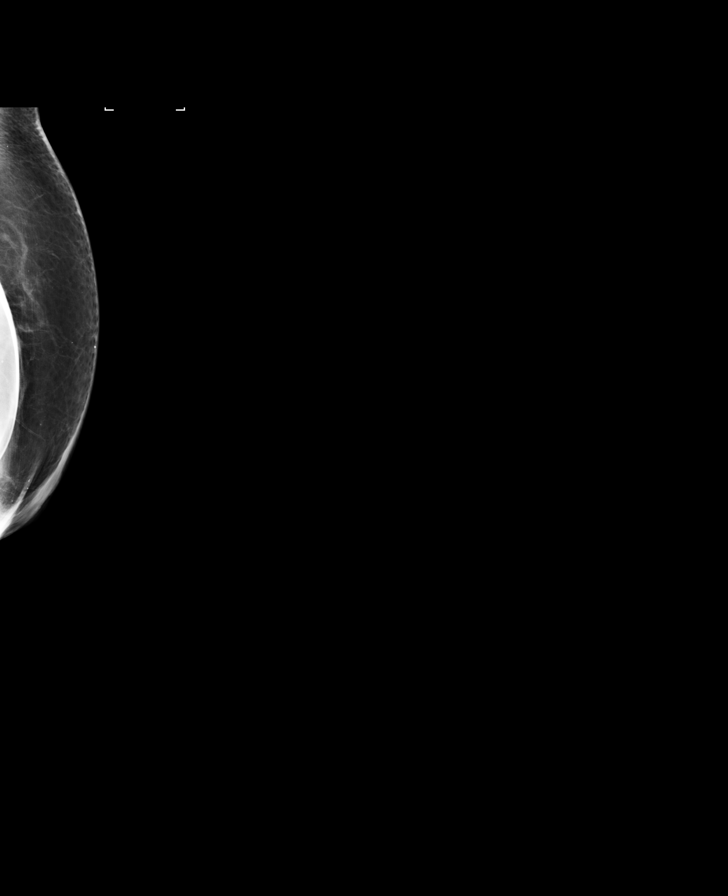

[L CC (1 of 2)]
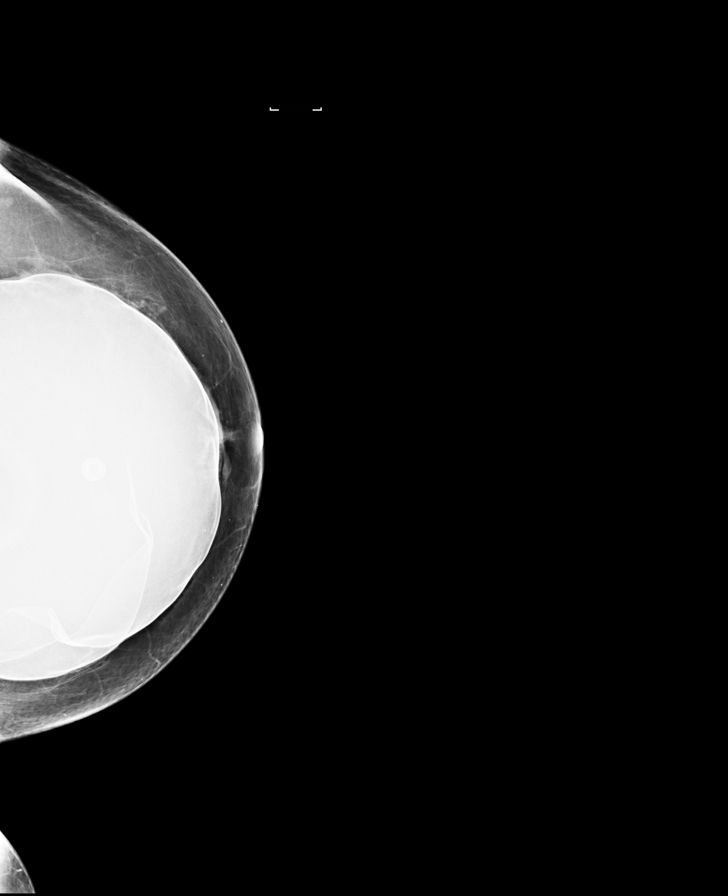

[L CC (2 of 2)]
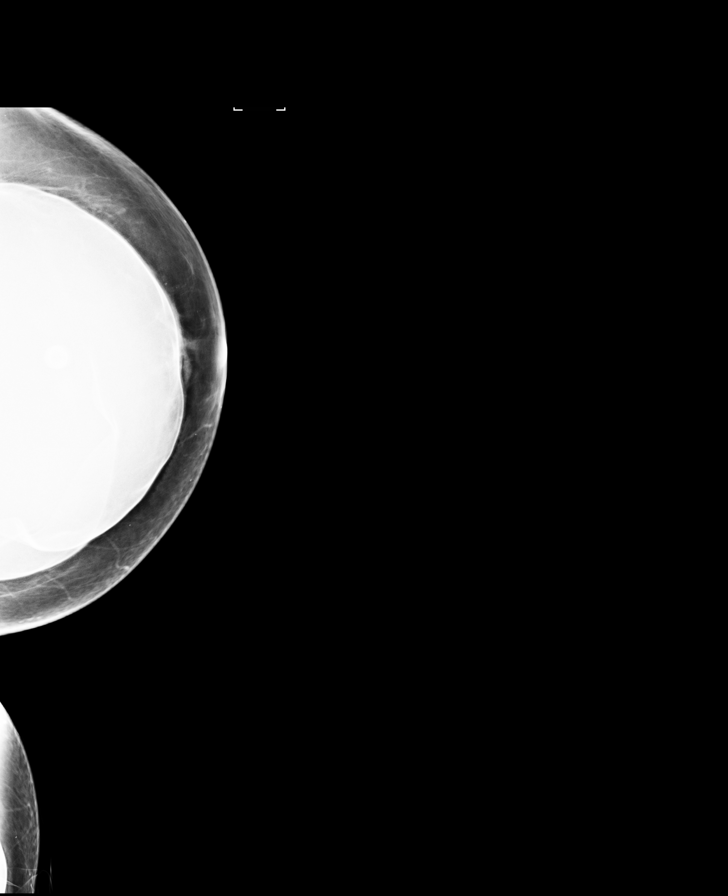

[L MLO synth-2D]
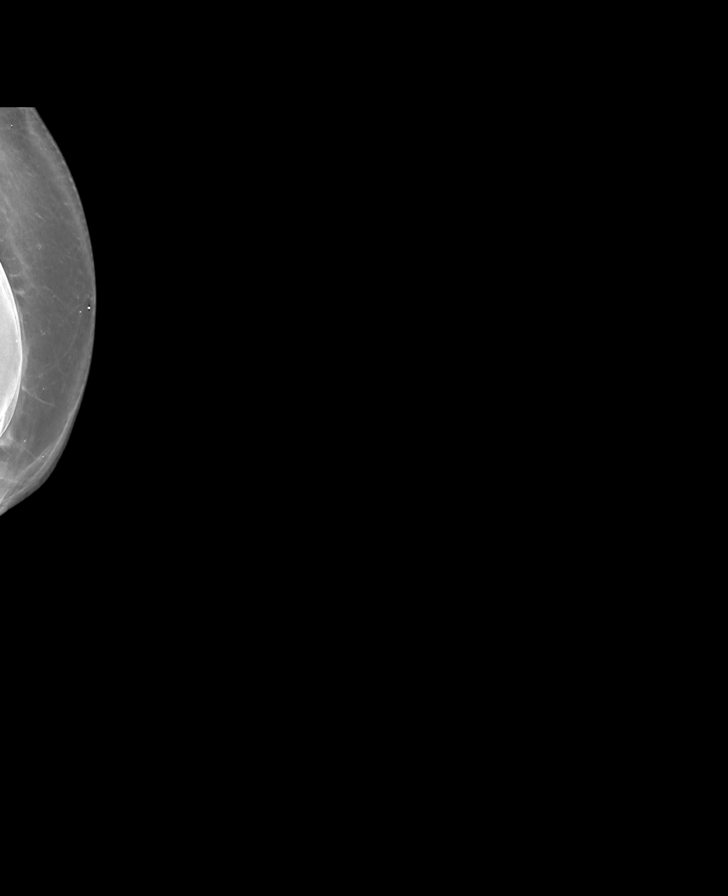

[L CC synth-2D]
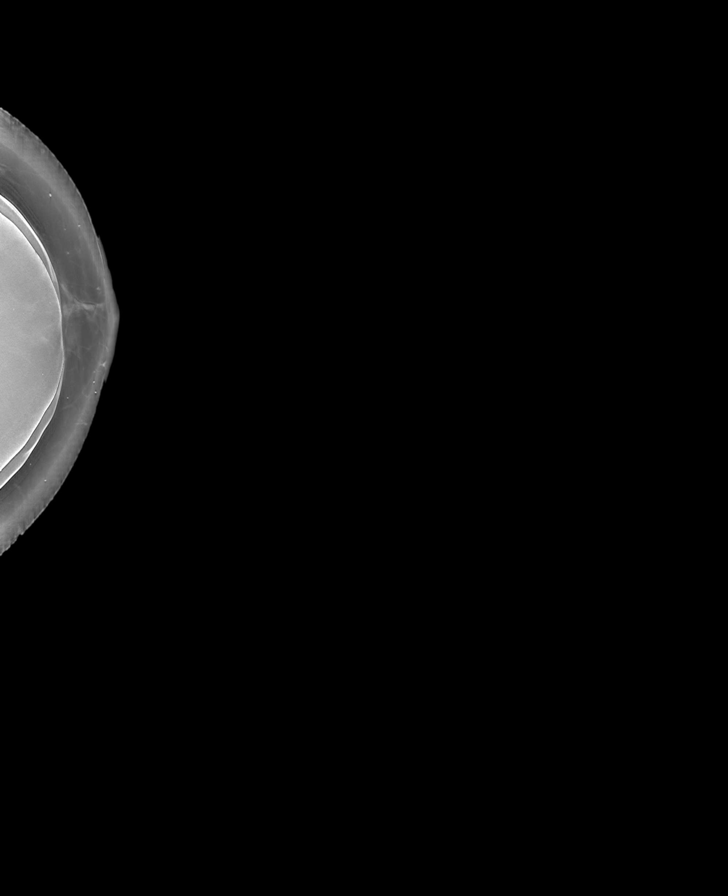

[8 of 33 positions shown; findings below may reference images not displayed]

FINDINGS: There are no findings suspicious for malignancy. Images were
processed with CAD.
IMPRESSION: No mammographic evidence of malignancy. A result letter of this
screening mammogram will be mailed directly to the patient.

RECOMMENDATION:
Screening mammogram in one year. (Code:[facility]-E-JAA)

BI-RADS CATEGORY  1:  Negative.

## 2019-01-08 NOTE — Telephone Encounter (Signed)
na

## 2019-04-08 ENCOUNTER — Other Ambulatory Visit: Payer: Medicare Other

## 2019-04-08 ENCOUNTER — Ambulatory Visit: Payer: Medicare Other | Admitting: Internal Medicine

## 2019-04-27 ENCOUNTER — Other Ambulatory Visit: Payer: Self-pay | Admitting: *Deleted

## 2019-04-27 DIAGNOSIS — C3412 Malignant neoplasm of upper lobe, left bronchus or lung: Secondary | ICD-10-CM

## 2019-07-07 ENCOUNTER — Other Ambulatory Visit: Payer: Self-pay

## 2019-07-08 ENCOUNTER — Other Ambulatory Visit: Payer: Self-pay

## 2019-07-08 ENCOUNTER — Inpatient Hospital Stay (HOSPITAL_BASED_OUTPATIENT_CLINIC_OR_DEPARTMENT_OTHER): Payer: Medicare Other | Admitting: Internal Medicine

## 2019-07-08 ENCOUNTER — Inpatient Hospital Stay: Payer: Medicare Other | Attending: Internal Medicine

## 2019-07-08 DIAGNOSIS — Z833 Family history of diabetes mellitus: Secondary | ICD-10-CM | POA: Insufficient documentation

## 2019-07-08 DIAGNOSIS — Z9221 Personal history of antineoplastic chemotherapy: Secondary | ICD-10-CM | POA: Insufficient documentation

## 2019-07-08 DIAGNOSIS — Z823 Family history of stroke: Secondary | ICD-10-CM

## 2019-07-08 DIAGNOSIS — Z885 Allergy status to narcotic agent status: Secondary | ICD-10-CM | POA: Insufficient documentation

## 2019-07-08 DIAGNOSIS — Z800 Family history of malignant neoplasm of digestive organs: Secondary | ICD-10-CM

## 2019-07-08 DIAGNOSIS — Z803 Family history of malignant neoplasm of breast: Secondary | ICD-10-CM

## 2019-07-08 DIAGNOSIS — M1990 Unspecified osteoarthritis, unspecified site: Secondary | ICD-10-CM

## 2019-07-08 DIAGNOSIS — Z79899 Other long term (current) drug therapy: Secondary | ICD-10-CM

## 2019-07-08 DIAGNOSIS — Z8042 Family history of malignant neoplasm of prostate: Secondary | ICD-10-CM | POA: Insufficient documentation

## 2019-07-08 DIAGNOSIS — C8330 Diffuse large B-cell lymphoma, unspecified site: Secondary | ICD-10-CM

## 2019-07-08 DIAGNOSIS — R7989 Other specified abnormal findings of blood chemistry: Secondary | ICD-10-CM

## 2019-07-08 DIAGNOSIS — Z87442 Personal history of urinary calculi: Secondary | ICD-10-CM

## 2019-07-08 DIAGNOSIS — C3412 Malignant neoplasm of upper lobe, left bronchus or lung: Secondary | ICD-10-CM

## 2019-07-08 DIAGNOSIS — Z8673 Personal history of transient ischemic attack (TIA), and cerebral infarction without residual deficits: Secondary | ICD-10-CM | POA: Diagnosis not present

## 2019-07-08 LAB — CBC WITH DIFFERENTIAL/PLATELET
Abs Immature Granulocytes: 0.02 K/uL (ref 0.00–0.07)
Basophils Absolute: 0.1 K/uL (ref 0.0–0.1)
Basophils Relative: 1 %
Eosinophils Absolute: 0.1 K/uL (ref 0.0–0.5)
Eosinophils Relative: 2 %
HCT: 39 % (ref 36.0–46.0)
Hemoglobin: 13.4 g/dL (ref 12.0–15.0)
Immature Granulocytes: 0 %
Lymphocytes Relative: 30 %
Lymphs Abs: 1.8 K/uL (ref 0.7–4.0)
MCH: 31.6 pg (ref 26.0–34.0)
MCHC: 34.4 g/dL (ref 30.0–36.0)
MCV: 92 fL (ref 80.0–100.0)
Monocytes Absolute: 0.4 K/uL (ref 0.1–1.0)
Monocytes Relative: 7 %
Neutro Abs: 3.6 K/uL (ref 1.7–7.7)
Neutrophils Relative %: 60 %
Platelets: 172 K/uL (ref 150–400)
RBC: 4.24 MIL/uL (ref 3.87–5.11)
RDW: 13 % (ref 11.5–15.5)
WBC: 6.1 K/uL (ref 4.0–10.5)
nRBC: 0 % (ref 0.0–0.2)

## 2019-07-08 LAB — COMPREHENSIVE METABOLIC PANEL WITH GFR
ALT: 44 U/L (ref 0–44)
AST: 45 U/L — ABNORMAL HIGH (ref 15–41)
Albumin: 4 g/dL (ref 3.5–5.0)
Alkaline Phosphatase: 120 U/L (ref 38–126)
Anion gap: 4 — ABNORMAL LOW (ref 5–15)
BUN: 14 mg/dL (ref 8–23)
CO2: 26 mmol/L (ref 22–32)
Calcium: 9 mg/dL (ref 8.9–10.3)
Chloride: 107 mmol/L (ref 98–111)
Creatinine, Ser: 0.59 mg/dL (ref 0.44–1.00)
GFR calc Af Amer: >60 mL/min
GFR calc non Af Amer: >60 mL/min
Glucose, Bld: 137 mg/dL — ABNORMAL HIGH (ref 70–99)
Potassium: 4 mmol/L (ref 3.5–5.1)
Sodium: 137 mmol/L (ref 135–145)
Total Bilirubin: 0.7 mg/dL (ref 0.3–1.2)
Total Protein: 7.6 g/dL (ref 6.5–8.1)

## 2019-07-08 NOTE — Progress Notes (Signed)
Patient does not offer any problems today.  

## 2019-07-08 NOTE — Progress Notes (Signed)
[place] OFFICE PROGRESS NOTE  Patient Care Team: Idelle Crouch, MD as PCP - General (Internal Medicine)  Cancer Staging No matching staging information was found for the patient.   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 Biopsy is suggestive of fatty liver changes  # carcinoma of lung status post left upper lobe resection in July of 2014; Adenocarcinoma T1N0 M0 tumor EGFR positive   Cancer of upper lobe of left lung (HCC)     INTERVAL HISTORY:  Nijah [age] y.o.  female pleasant patient above history of Diffuse large cell lymphoma and also history of stage I lung cancer is here for follow-up.  Patient is a lot of stress as her husband has been diagnosed with dementia.  Otherwise she is doing well.  No lumps or bumps her appetite is good.  No weight loss.  Nausea no vomiting.  Review of Systems  Constitutional: Negative for chills, diaphoresis, fever, malaise/fatigue and weight loss.  HENT: Negative for nosebleeds and sore throat.   Eyes: Negative for double vision.  Respiratory: Negative for cough, hemoptysis, sputum production, shortness of breath and wheezing.   Cardiovascular: Negative for chest pain, palpitations, orthopnea and leg swelling.  Gastrointestinal: Negative for abdominal pain, blood in stool, constipation, diarrhea, heartburn, melena, nausea and vomiting.  Genitourinary: Negative for dysuria, frequency and urgency.  Musculoskeletal: Negative for back pain and joint pain.  Skin: Negative.  Negative for itching and rash.  Neurological: Negative for dizziness, tingling, focal weakness, weakness and headaches.  Endo/Heme/Allergies: Does not bruise/bleed easily.  Psychiatric/Behavioral: Negative for depression. The patient is not nervous/anxious and does not have insomnia.      PAST MEDICAL HISTORY :  Past Medical History:  Diagnosis Date  . A-fib ([place])     Only once  . Anxiety   . Arthritis    oesteoarthritis  . BP (high blood pressure) 04/16/2014  . Chronic kidney disease    history nephrolithiasis  . Depression   . Dysrhythmia    PSVT  . Endometriosis   . Herpes zoster   . Lung cancer ([place]) left  . Lymphoma ([place])    "stomach"  . Stroke [facility])     PAST SURGICAL HISTORY :   Past Surgical History:  Procedure Laterality Date  . AUGMENTATION MAMMAPLASTY Bilateral   . CHOLECYSTECTOMY    . COLONOSCOPY    . COLONOSCOPY WITH PROPOFOL N/A 04/14/2018   Procedure: COLONOSCOPY WITH PROPOFOL;  Surgeon: Manya Silvas, MD;  Location: [facility] ENDOSCOPY;  Service: Endoscopy;  Laterality: N/A;  . DILATION AND CURETTAGE OF UTERUS    . HEMORRHOIDECTOMY WITH HEMORRHOID BANDING    . HERNIA REPAIR    . LUNG REMOVAL, PARTIAL Left     FAMILY HISTORY :   Family History  Problem Relation Age of Onset  . Stroke Mother   . Diabetes Mother   . Colon cancer Father   . Prostate cancer Father   . Breast cancer Neg Hx     SOCIAL HISTORY:   Social History   Tobacco Use  . Smoking status: Never Smoker  . Smokeless tobacco: Never Used  Substance Use Topics  . Alcohol use: No  . Drug use: No    ALLERGIES:  is allergic to buspirone and codeine.  MEDICATIONS:  Current Outpatient Medications  Medication Sig Dispense Refill  . ALPRAZolam (XANAX) 0.5 MG tablet Take 1 tablet (0.5 mg total) by mouth 3 (  three) times daily as needed for anxiety. 21 tablet 0  . aspirin 81 MG chewable tablet Chew by mouth.    . Cholecalciferol (D 2000) 2000 UNITS TABS Take by mouth.    . loratadine (CLARITIN) 10 MG tablet Take 1 tablet (10 mg total) by mouth daily. 90 tablet 3  . losartan (COZAAR) 50 MG tablet     . meloxicam (MOBIC) 15 MG tablet Take by mouth.    . nebivolol (BYSTOLIC) 10 MG tablet Take by mouth.    . tiotropium (SPIRIVA) 18 MCG inhalation capsule Place into inhaler and inhale.    . triamcinolone (NASACORT ALLERGY 24HR) 55 MCG/ACT AERO nasal inhaler  Place 2 sprays into the nose daily.    Marland Kitchen venlafaxine XR (EFFEXOR-XR) 75 MG 24 hr capsule Take by mouth.    . ciprofloxacin (CIPRO) 500 MG tablet Take 1 tablet (500 mg total) by mouth 2 (two) times daily. (Patient not taking: Reported on 07/08/2019) 14 tablet 0  . furosemide (LASIX) 40 MG tablet Take by mouth.    . gabapentin (NEURONTIN) 100 MG capsule Take 100 mg by mouth 3 (three) times daily.    . pantoprazole (PROTONIX) 40 MG tablet Take by mouth.    . phenazopyridine (PYRIDIUM) 200 MG tablet Take 1 tablet (200 mg total) by mouth 3 (three) times daily as needed for pain. (Patient not taking: Reported on 07/08/2019) 20 tablet 0   No current facility-administered medications for this visit.     PHYSICAL EXAMINATION: ECOG PERFORMANCE STATUS: 0 - Asymptomatic  BP 118/80   Pulse 72   Temp 98.3 F (36.8 C)   Resp 18   Wt 194 lb 6.4 oz (88.2 kg)   BMI 31.38 kg/m   Filed Weights   07/08/19 0954  Weight: 194 lb 6.4 oz (88.2 kg)    Physical Exam  Constitutional: She is oriented to person, place, and time and well-developed, well-nourished, and in no distress.  HENT:  Head: Normocephalic and atraumatic.  Mouth/Throat: Oropharynx is clear and moist. No oropharyngeal exudate.  Eyes: Pupils are equal, round, and reactive to light.  Neck: Normal range of motion. Neck supple.  Cardiovascular: Normal rate and regular rhythm.  Pulmonary/Chest: Effort normal and breath sounds normal. No respiratory distress. She has no wheezes.  Abdominal: Soft. Bowel sounds are normal. She exhibits no distension and no mass. There is no abdominal tenderness. There is no rebound and no guarding.  Musculoskeletal: Normal range of motion.        General: No tenderness or edema.  Neurological: She is alert and oriented to person, place, and time.  Skin: Skin is warm.  Psychiatric: Affect normal.     LABORATORY DATA:  I have reviewed the data as listed    Component Value Date/Time   NA 137 07/08/2019 0936    NA 139 03/14/2015 0856   K 4.0 07/08/2019 0936   K 4.1 03/14/2015 0856   CL 107 07/08/2019 0936   CL 103 03/14/2015 0856   CO2 26 07/08/2019 0936   CO2 28 03/14/2015 0856   GLUCOSE 137 (H) 07/08/2019 0936   GLUCOSE 111 (H) 03/14/2015 0856   BUN 14 07/08/2019 0936   BUN 19 03/14/2015 0856   CREATININE 0.59 07/08/2019 0936   CREATININE 0.56 03/14/2015 0856   CALCIUM 9.0 07/08/2019 0936   CALCIUM 9.3 03/14/2015 0856   PROT 7.6 07/08/2019 0936   PROT 7.7 03/14/2015 0856   ALBUMIN 4.0 07/08/2019 0936   ALBUMIN 4.3 03/14/2015 0856   AST  45 (H) 07/08/2019 0936   AST 80 (H) 03/14/2015 0856   ALT 44 07/08/2019 0936   ALT 87 (H) 03/14/2015 0856   ALKPHOS 120 07/08/2019 0936   ALKPHOS 164 (H) 03/14/2015 0856   BILITOT 0.7 07/08/2019 0936   BILITOT 0.8 03/14/2015 0856   GFRNONAA >60 07/08/2019 0936   GFRNONAA >60 03/14/2015 0856   GFRAA >60 07/08/2019 0936   GFRAA >60 03/14/2015 0856    No results found for: SPEP, UPEP  Lab Results  Component Value Date   WBC 6.1 07/08/2019   NEUTROABS 3.6 07/08/2019   HGB 13.4 07/08/2019   HCT 39.0 07/08/2019   MCV 92.0 07/08/2019   PLT 172 07/08/2019      Chemistry      Component Value Date/Time   NA 137 07/08/2019 0936   NA 139 03/14/2015 0856   K 4.0 07/08/2019 0936   K 4.1 03/14/2015 0856   CL 107 07/08/2019 0936   CL 103 03/14/2015 0856   CO2 26 07/08/2019 0936   CO2 28 03/14/2015 0856   BUN 14 07/08/2019 0936   BUN 19 03/14/2015 0856   CREATININE 0.59 07/08/2019 0936   CREATININE 0.56 03/14/2015 0856      Component Value Date/Time   CALCIUM 9.0 07/08/2019 0936   CALCIUM 9.3 03/14/2015 0856   ALKPHOS 120 07/08/2019 0936   ALKPHOS 164 (H) 03/14/2015 0856   AST 45 (H) 07/08/2019 0936   AST 80 (H) 03/14/2015 0856   ALT 44 07/08/2019 0936   ALT 87 (H) 03/14/2015 0856   BILITOT 0.7 07/08/2019 0936   BILITOT 0.8 03/14/2015 0856       RADIOGRAPHIC STUDIES: I have personally reviewed the radiological images as listed  and agreed with the findings in the report. No results found.   ASSESSMENT & PLAN:  Cancer of upper lobe of left lung ([place]) # LUL lung ca- stage I [4193 June]; clinically no evidence of recurrence. Last- CT scan March 2019- NED; stable; follow surveillance.    # DLBCL s/p chemo; clinically NED. STABLE Likely cured; no imaging needed.   # slightly elevated LFts-?  Fatty liver.  Normal/improved.  # DISPOSITION:  # follow up in  12 months- MD- labs- cbc/cmp/ldh- Dr.B      Orders Placed This Encounter  Procedures  . CBC with Differential    Standing Status:   Future    Standing Expiration Date:   01/07/2021  . Comprehensive metabolic panel    Standing Status:   Future    Standing Expiration Date:   01/07/2021  . Lactate dehydrogenase    Standing Status:   Future    Standing Expiration Date:   01/07/2021   All questions were answered. The patient knows to call the clinic with any problems, questions or concerns.      Cammie Sickle, MD 07/08/2019 10:28 AM

## 2019-07-08 NOTE — Assessment & Plan Note (Addendum)
#   LUL lung ca- stage I [2014 June]; clinically no evidence of recurrence. Last- CT scan March 2019- NED; stable; follow surveillance.    # DLBCL s/p chemo; clinically NED. STABLE Likely cured; no imaging needed.   # slightly elevated LFts-?  Fatty liver.  Normal/improved.  # DISPOSITION:  # follow up in  12 months- MD- labs- cbc/cmp/ldh- Dr.B

## 2019-08-06 ENCOUNTER — Other Ambulatory Visit: Payer: Self-pay | Admitting: Internal Medicine

## 2019-08-06 DIAGNOSIS — Z1231 Encounter for screening mammogram for malignant neoplasm of breast: Secondary | ICD-10-CM

## 2019-09-10 ENCOUNTER — Ambulatory Visit
Admission: RE | Admit: 2019-09-10 | Discharge: 2019-09-10 | Disposition: A | Discharge status: Home / Self Care | Payer: Medicare Other | Source: Ambulatory Visit | Attending: Internal Medicine | Admitting: Internal Medicine

## 2019-09-10 DIAGNOSIS — Z1231 Encounter for screening mammogram for malignant neoplasm of breast: Secondary | ICD-10-CM | POA: Diagnosis not present

## 2020-02-19 ENCOUNTER — Ambulatory Visit: Payer: Medicare Other

## 2020-03-19 ENCOUNTER — Ambulatory Visit: Payer: Medicare PPO | Attending: Internal Medicine

## 2020-03-19 ENCOUNTER — Other Ambulatory Visit: Payer: Self-pay

## 2020-03-19 DIAGNOSIS — Z23 Encounter for immunization: Secondary | ICD-10-CM

## 2020-03-19 NOTE — Progress Notes (Signed)
   Covid-19 Vaccination Clinic  Name:  Julie Jennings    MRN: [id number] DOB: 23-Feb-1951  03/19/2020  [name] was observed post Covid-19 immunization for 15 minutes without incident. She was provided with Vaccine Information Sheet and instruction to access the V-Safe system.   [name] was instructed to call 911 with any severe reactions post vaccine: Marland Kitchen Difficulty breathing  . Swelling of face and throat  . A fast heartbeat  . A bad rash all over body  . Dizziness and weakness   Immunizations Administered    Name Date Dose VIS Date Route   Pfizer COVID-19 Vaccine 03/19/2020  1:21 PM 0.3 mL 11/20/2019 Intramuscular   Manufacturer: [place]   Lot: [id number]   [place]: [id number]

## 2020-04-12 ENCOUNTER — Ambulatory Visit: Payer: Medicare PPO | Attending: Internal Medicine

## 2020-04-12 DIAGNOSIS — Z23 Encounter for immunization: Secondary | ICD-10-CM

## 2020-04-12 NOTE — Progress Notes (Signed)
   Covid-19 Vaccination Clinic  Name:  Julie Jennings    MRN: [id number] DOB: 1951/02/11  04/12/2020  Julie Jennings was observed post Covid-19 immunization for 15 minutes without incident. She was provided with Vaccine Information Sheet and instruction to access the V-Safe system.   Julie Jennings was instructed to call 911 with any severe reactions post vaccine: Marland Kitchen Difficulty breathing  . Swelling of face and throat  . A fast heartbeat  . A bad rash all over body  . Dizziness and weakness   Immunizations Administered    Name Date Dose VIS Date Route   Pfizer COVID-19 Vaccine 04/12/2020  1:55 PM 0.3 mL 02/03/2019 Intramuscular   Manufacturer: [place]   Lot: [id number]   [place]: [id number]

## 2020-05-19 DIAGNOSIS — R0602 Shortness of breath: Secondary | ICD-10-CM | POA: Insufficient documentation

## 2020-07-07 ENCOUNTER — Inpatient Hospital Stay (HOSPITAL_BASED_OUTPATIENT_CLINIC_OR_DEPARTMENT_OTHER): Payer: Medicare PPO | Admitting: Internal Medicine

## 2020-07-07 ENCOUNTER — Inpatient Hospital Stay: Admission: RE | Admit: 2020-07-07 | Payer: Medicare PPO | Source: Home / Self Care | Admitting: Internal Medicine

## 2020-07-07 ENCOUNTER — Other Ambulatory Visit: Payer: Self-pay

## 2020-07-07 ENCOUNTER — Encounter: Payer: Self-pay | Admitting: Internal Medicine

## 2020-07-07 ENCOUNTER — Ambulatory Visit
Admission: RE | Admit: 2020-07-07 | Discharge: 2020-07-07 | Disposition: A | Discharge status: Home / Self Care | Payer: Medicare PPO | Attending: Internal Medicine | Admitting: Internal Medicine

## 2020-07-07 ENCOUNTER — Inpatient Hospital Stay: Payer: Medicare PPO | Attending: Internal Medicine

## 2020-07-07 ENCOUNTER — Ambulatory Visit
Admission: RE | Admit: 2020-07-07 | Discharge: 2020-07-07 | Disposition: A | Discharge status: Home / Self Care | Payer: Medicare PPO | Source: Ambulatory Visit | Attending: Internal Medicine | Admitting: Internal Medicine

## 2020-07-07 VITALS — BP 112/77 | HR 69 | Temp 98.7°F | Resp 16 | Ht 66.0 in | Wt 200.0 lb

## 2020-07-07 DIAGNOSIS — N189 Chronic kidney disease, unspecified: Secondary | ICD-10-CM | POA: Diagnosis not present

## 2020-07-07 DIAGNOSIS — Z833 Family history of diabetes mellitus: Secondary | ICD-10-CM | POA: Insufficient documentation

## 2020-07-07 DIAGNOSIS — F419 Anxiety disorder, unspecified: Secondary | ICD-10-CM | POA: Diagnosis not present

## 2020-07-07 DIAGNOSIS — Z8673 Personal history of transient ischemic attack (TIA), and cerebral infarction without residual deficits: Secondary | ICD-10-CM | POA: Diagnosis not present

## 2020-07-07 DIAGNOSIS — Z800 Family history of malignant neoplasm of digestive organs: Secondary | ICD-10-CM | POA: Insufficient documentation

## 2020-07-07 DIAGNOSIS — Z85118 Personal history of other malignant neoplasm of bronchus and lung: Secondary | ICD-10-CM | POA: Insufficient documentation

## 2020-07-07 DIAGNOSIS — Z8572 Personal history of non-Hodgkin lymphomas: Secondary | ICD-10-CM | POA: Insufficient documentation

## 2020-07-07 DIAGNOSIS — Z7982 Long term (current) use of aspirin: Secondary | ICD-10-CM | POA: Diagnosis not present

## 2020-07-07 DIAGNOSIS — I4891 Unspecified atrial fibrillation: Secondary | ICD-10-CM | POA: Insufficient documentation

## 2020-07-07 DIAGNOSIS — C3412 Malignant neoplasm of upper lobe, left bronchus or lung: Secondary | ICD-10-CM

## 2020-07-07 DIAGNOSIS — R059 Cough, unspecified: Secondary | ICD-10-CM

## 2020-07-07 DIAGNOSIS — R05 Cough: Secondary | ICD-10-CM

## 2020-07-07 DIAGNOSIS — Z9221 Personal history of antineoplastic chemotherapy: Secondary | ICD-10-CM | POA: Insufficient documentation

## 2020-07-07 DIAGNOSIS — Z8042 Family history of malignant neoplasm of prostate: Secondary | ICD-10-CM | POA: Diagnosis not present

## 2020-07-07 DIAGNOSIS — Z79899 Other long term (current) drug therapy: Secondary | ICD-10-CM | POA: Diagnosis not present

## 2020-07-07 DIAGNOSIS — F329 Major depressive disorder, single episode, unspecified: Secondary | ICD-10-CM | POA: Insufficient documentation

## 2020-07-07 LAB — COMPREHENSIVE METABOLIC PANEL
ALT: 46 U/L — ABNORMAL HIGH (ref 0–44)
AST: 39 U/L (ref 15–41)
Albumin: 3.8 g/dL (ref 3.5–5.0)
Alkaline Phosphatase: 101 U/L (ref 38–126)
Anion gap: 9 (ref 5–15)
BUN: 22 mg/dL (ref 8–23)
CO2: 32 mmol/L (ref 22–32)
Calcium: 9.3 mg/dL (ref 8.9–10.3)
Chloride: 98 mmol/L (ref 98–111)
Creatinine, Ser: 0.62 mg/dL (ref 0.44–1.00)
GFR calc Af Amer: >60 mL/min (ref >60)
GFR calc non Af Amer: >60 mL/min (ref >60)
Glucose, Bld: 118 mg/dL — ABNORMAL HIGH (ref 70–99)
Potassium: 4.1 mmol/L (ref 3.5–5.1)
Sodium: 139 mmol/L (ref 135–145)
Total Bilirubin: 1 mg/dL (ref 0.3–1.2)
Total Protein: 7.5 g/dL (ref 6.5–8.1)

## 2020-07-07 LAB — CBC WITH DIFFERENTIAL/PLATELET
Abs Immature Granulocytes: 0.07 10*3/uL (ref 0.00–0.07)
Basophils Absolute: 0.1 10*3/uL (ref 0.0–0.1)
Basophils Relative: 1 %
Eosinophils Absolute: 0.2 10*3/uL (ref 0.0–0.5)
Eosinophils Relative: 3 %
HCT: 38.6 % (ref 36.0–46.0)
Hemoglobin: 13.3 g/dL (ref 12.0–15.0)
Immature Granulocytes: 1 %
Lymphocytes Relative: 26 %
Lymphs Abs: 2.5 10*3/uL (ref 0.7–4.0)
MCH: 32.2 pg (ref 26.0–34.0)
MCHC: 34.5 g/dL (ref 30.0–36.0)
MCV: 93.5 fL (ref 80.0–100.0)
Monocytes Absolute: 0.6 10*3/uL (ref 0.1–1.0)
Monocytes Relative: 7 %
Neutro Abs: 5.9 10*3/uL (ref 1.7–7.7)
Neutrophils Relative %: 62 %
Platelets: 191 10*3/uL (ref 150–400)
RBC: 4.13 MIL/uL (ref 3.87–5.11)
RDW: 13.4 % (ref 11.5–15.5)
WBC: 9.4 10*3/uL (ref 4.0–10.5)
nRBC: 0 % (ref 0.0–0.2)

## 2020-07-07 LAB — LACTATE DEHYDROGENASE: LDH: 151 U/L (ref 98–192)

## 2020-07-07 NOTE — Progress Notes (Signed)
Hi- just wanted to let you know that the CXR looks normal; as expected from her previous lung surgery/ and recent bronchitis. Call us if any questions or concerns. Dr.B

## 2020-07-07 NOTE — Assessment & Plan Note (Addendum)
#   LUL lung ca- stage I [2014 June]; clinically no evidence of recurrence. Last- CT scan March 2019- NED; stable; repeat CXR.   #Diffuse B-cell lymphoma; s/p chemo; clinically NED. STABLE; Likely cured; no imaging needed.   # slightly elevated LFts-?  Fatty liver.  STABLE.   # Acute Bronchitis- s/p prednisone- improved.  Await chest x-ray  # DISPOSITION: my chart messgae- CXR CXR today # follow up in  12 months- MD- labs- cbc/cmp/ldh- Dr.B

## 2020-07-07 NOTE — Progress Notes (Signed)
[place] OFFICE PROGRESS NOTE  Patient Care Team: Idelle Crouch, MD as PCP - General (Internal Medicine)  Cancer Staging No matching staging information was found for the patient.   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 Biopsy is suggestive of fatty liver changes  # carcinoma of lung status post left upper lobe resection in July of 2014; Adenocarcinoma T1N0 M0 tumor EGFR positive   Cancer of upper lobe of left lung (HCC)     INTERVAL HISTORY:  Julie Jennings [age] y.o.  female pleasant patient above history of Diffuse large cell lymphoma and also history of stage I lung cancer is here for follow-up.  In the interim lost her husband few months ago. She seems to be coping up fairly well with the loss.  Recent episode of bronchitis for which she was treated with steroids.  Currently cough is improved.  No nausea no vomiting no cough.  Review of Systems  Constitutional: Negative for chills, diaphoresis, fever, malaise/fatigue and weight loss.  HENT: Negative for nosebleeds and sore throat.   Eyes: Negative for double vision.  Respiratory: Negative for cough, hemoptysis, sputum production, shortness of breath and wheezing.   Cardiovascular: Negative for chest pain, palpitations, orthopnea and leg swelling.  Gastrointestinal: Negative for abdominal pain, blood in stool, constipation, diarrhea, heartburn, melena, nausea and vomiting.  Genitourinary: Negative for dysuria, frequency and urgency.  Musculoskeletal: Negative for back pain and joint pain.  Skin: Negative.  Negative for itching and rash.  Neurological: Negative for dizziness, tingling, focal weakness, weakness and headaches.  Endo/Heme/Allergies: Does not bruise/bleed easily.  Psychiatric/Behavioral: Negative for depression. The patient is not nervous/anxious and does not have insomnia.      PAST MEDICAL HISTORY :  Past  Medical History:  Diagnosis Date  . A-fib ([place])    Only once  . Anxiety   . Arthritis    oesteoarthritis  . BP (high blood pressure) 04/16/2014  . Chronic kidney disease    history nephrolithiasis  . Depression   . Dysrhythmia    PSVT  . Endometriosis   . Herpes zoster   . Lung cancer ([place]) left  . Lymphoma ([place])    "stomach"  . Stroke [facility])     PAST SURGICAL HISTORY :   Past Surgical History:  Procedure Laterality Date  . AUGMENTATION MAMMAPLASTY Bilateral   . CHOLECYSTECTOMY    . COLONOSCOPY    . COLONOSCOPY WITH PROPOFOL N/A 04/14/2018   Procedure: COLONOSCOPY WITH PROPOFOL;  Surgeon: Manya Silvas, MD;  Location: [facility] ENDOSCOPY;  Service: Endoscopy;  Laterality: N/A;  . DILATION AND CURETTAGE OF UTERUS    . HEMORRHOIDECTOMY WITH HEMORRHOID BANDING    . HERNIA REPAIR    . LUNG REMOVAL, PARTIAL Left     FAMILY HISTORY :   Family History  Problem Relation Age of Onset  . Stroke Mother   . Diabetes Mother   . Colon cancer Father   . Prostate cancer Father   . Breast cancer Neg Hx     SOCIAL HISTORY:   Social History   Tobacco Use  . Smoking status: Never Smoker  . Smokeless tobacco: Never Used  Vaping Use  . Vaping Use: Never used  Substance Use Topics  . Alcohol use: No  . Drug use: No    ALLERGIES:  is allergic to buspirone, prednisone, and codeine.  MEDICATIONS:  Current Outpatient Medications  Medication Sig Dispense  Refill  . ALPRAZolam (XANAX) 0.5 MG tablet Take 1 tablet (0.5 mg total) by mouth 3 (three) times daily as needed for anxiety. 21 tablet 0  . aspirin 81 MG chewable tablet Chew by mouth.    . Cholecalciferol (D 2000) 2000 UNITS TABS Take by mouth.    . gabapentin (NEURONTIN) 100 MG capsule Take 100 mg by mouth 3 (three) times daily.    Marland Kitchen loratadine (CLARITIN) 10 MG tablet Take 1 tablet (10 mg total) by mouth daily. 90 tablet 3  . losartan (COZAAR) 100 MG tablet Take 1 tablet by mouth daily.    . meloxicam (MOBIC) 15 MG tablet Take  by mouth.    . nebivolol (BYSTOLIC) 10 MG tablet Take by mouth.    . tiotropium (SPIRIVA) 18 MCG inhalation capsule Place into inhaler and inhale.    . triamcinolone (NASACORT ALLERGY 24HR) 55 MCG/ACT AERO nasal inhaler Place 2 sprays into the nose daily.    Marland Kitchen venlafaxine XR (EFFEXOR-XR) 75 MG 24 hr capsule Take by mouth.    . furosemide (LASIX) 40 MG tablet Take by mouth.    . pantoprazole (PROTONIX) 40 MG tablet Take by mouth.    . phenazopyridine (PYRIDIUM) 200 MG tablet Take 1 tablet (200 mg total) by mouth 3 (three) times daily as needed for pain. (Patient not taking: Reported on 07/08/2019) 20 tablet 0   No current facility-administered medications for this visit.    PHYSICAL EXAMINATION: ECOG PERFORMANCE STATUS: 0 - Asymptomatic  BP 112/77 (BP Location: Left Arm, Patient Position: Sitting, Cuff Size: Large)   Pulse 69   Temp 98.7 F (37.1 C) (Tympanic)   Resp 16   Ht '5\' 6"'  (1.676 m)   Wt 200 lb (90.7 kg)   SpO2 99%   BMI 32.28 kg/m   Filed Weights   07/07/20 1050  Weight: 200 lb (90.7 kg)    Physical Exam HENT:     Head: Normocephalic and atraumatic.     Mouth/Throat:     Pharynx: No oropharyngeal exudate.  Eyes:     Pupils: Pupils are equal, round, and reactive to light.  Cardiovascular:     Rate and Rhythm: Normal rate and regular rhythm.  Pulmonary:     Effort: Pulmonary effort is normal. No respiratory distress.     Breath sounds: Normal breath sounds. No wheezing.  Abdominal:     General: Bowel sounds are normal. There is no distension.     Palpations: Abdomen is soft. There is no mass.     Tenderness: There is no abdominal tenderness. There is no guarding or rebound.  Musculoskeletal:        General: No tenderness. Normal range of motion.     Cervical back: Normal range of motion and neck supple.  Skin:    General: Skin is warm.  Neurological:     Mental Status: She is alert and oriented to person, place, and time.  Psychiatric:        Mood and  Affect: Affect normal.      LABORATORY DATA:  I have reviewed the data as listed    Component Value Date/Time   NA 139 07/07/2020 1041   NA 139 03/14/2015 0856   K 4.1 07/07/2020 1041   K 4.1 03/14/2015 0856   CL 98 07/07/2020 1041   CL 103 03/14/2015 0856   CO2 32 07/07/2020 1041   CO2 28 03/14/2015 0856   GLUCOSE 118 (H) 07/07/2020 1041   GLUCOSE 111 (H) 03/14/2015 7253  BUN 22 07/07/2020 1041   BUN 19 03/14/2015 0856   CREATININE 0.62 07/07/2020 1041   CREATININE 0.56 03/14/2015 0856   CALCIUM 9.3 07/07/2020 1041   CALCIUM 9.3 03/14/2015 0856   PROT 7.5 07/07/2020 1041   PROT 7.7 03/14/2015 0856   ALBUMIN 3.8 07/07/2020 1041   ALBUMIN 4.3 03/14/2015 0856   AST 39 07/07/2020 1041   AST 80 (H) 03/14/2015 0856   ALT 46 (H) 07/07/2020 1041   ALT 87 (H) 03/14/2015 0856   ALKPHOS 101 07/07/2020 1041   ALKPHOS 164 (H) 03/14/2015 0856   BILITOT 1.0 07/07/2020 1041   BILITOT 0.8 03/14/2015 0856   GFRNONAA >60 07/07/2020 1041   GFRNONAA >60 03/14/2015 0856   GFRAA >60 07/07/2020 1041   GFRAA >60 03/14/2015 0856    No results found for: SPEP, UPEP  Lab Results  Component Value Date   WBC 9.4 07/07/2020   NEUTROABS 5.9 07/07/2020   HGB 13.3 07/07/2020   HCT 38.6 07/07/2020   MCV 93.5 07/07/2020   PLT 191 07/07/2020      Chemistry      Component Value Date/Time   NA 139 07/07/2020 1041   NA 139 03/14/2015 0856   K 4.1 07/07/2020 1041   K 4.1 03/14/2015 0856   CL 98 07/07/2020 1041   CL 103 03/14/2015 0856   CO2 32 07/07/2020 1041   CO2 28 03/14/2015 0856   BUN 22 07/07/2020 1041   BUN 19 03/14/2015 0856   CREATININE 0.62 07/07/2020 1041   CREATININE 0.56 03/14/2015 0856      Component Value Date/Time   CALCIUM 9.3 07/07/2020 1041   CALCIUM 9.3 03/14/2015 0856   ALKPHOS 101 07/07/2020 1041   ALKPHOS 164 (H) 03/14/2015 0856   AST 39 07/07/2020 1041   AST 80 (H) 03/14/2015 0856   ALT 46 (H) 07/07/2020 1041   ALT 87 (H) 03/14/2015 0856   BILITOT 1.0  07/07/2020 1041   BILITOT 0.8 03/14/2015 0856       RADIOGRAPHIC STUDIES: I have personally reviewed the radiological images as listed and agreed with the findings in the report. No results found.   ASSESSMENT & PLAN:  Cancer of upper lobe of left lung ([place]) # LUL lung ca- stage I [5883 June]; clinically no evidence of recurrence. Last- CT scan March 2019- NED; stable; repeat CXR.   #Diffuse B-cell lymphoma; s/p chemo; clinically NED. STABLE; Likely cured; no imaging needed.   # slightly elevated LFts-?  Fatty liver.  STABLE.   # Acute Bronchitis- s/p prednisone- improved.  Await chest x-ray  # DISPOSITION: my chart messgae- CXR CXR today # follow up in  12 months- MD- labs- cbc/cmp/ldh- Dr.B      Orders Placed This Encounter  Procedures  . DG Chest 2 View    Standing Status:   Future    Number of Occurrences:   1    Standing Expiration Date:   07/07/2021    Order Specific Question:   Reason for Exam (SYMPTOM  OR DIAGNOSIS REQUIRED)    Answer:   cough; hx of lung cancer    Order Specific Question:   Preferred imaging location?    Answer:   [facility]  . CBC with Differential    Standing Status:   Future    Standing Expiration Date:   07/07/2021  . Comprehensive metabolic panel    Standing Status:   Future    Standing Expiration Date:   07/07/2021  . Lactate dehydrogenase  Standing Status:   Future    Standing Expiration Date:   07/07/2021   All questions were answered. The patient knows to call the clinic with any problems, questions or concerns.      Cammie Sickle, MD 07/07/2020 12:11 PM

## 2020-07-07 NOTE — Patient Instructions (Signed)
CXR today.  

## 2020-11-08 ENCOUNTER — Telehealth: Payer: Self-pay | Admitting: *Deleted

## 2020-11-08 NOTE — Telephone Encounter (Signed)
PATIENT  Called on Monday and said that she saw her cousin last tues. They were 6 ft apart. The cousin called her on Monday letting her know that she tested positive for covid. Ms. Laughery was having coughing spells, drainage sometimes, and today has HA. She wanted to know what to do. I spoke to Dr. Rogue Bussing and he rec: a covid test. I called pt back to let her know. She states that she has already spoke to her PCP office and that she awaiting them to call her and she already knows that they do the covid test at [facility] clinic. Pt. To call back if she can't get covid test done.

## 2020-11-12 ENCOUNTER — Telehealth: Payer: Self-pay | Admitting: Internal Medicine

## 2020-11-12 NOTE — Telephone Encounter (Signed)
On 12/03-spoke to patient regarding her recent Covid positive diagnosis.  Patient had previously been vaccinated.  Reviewed the treatment/MAB therapy patient improved continue follow-up with PCP.   Patient very much appreciates response/care provided by [facility].

## 2021-02-06 ENCOUNTER — Ambulatory Visit: Payer: Medicare PPO | Admitting: Dermatology

## 2021-02-06 ENCOUNTER — Other Ambulatory Visit: Payer: Self-pay

## 2021-02-06 DIAGNOSIS — B353 Tinea pedis: Secondary | ICD-10-CM

## 2021-02-06 DIAGNOSIS — L820 Inflamed seborrheic keratosis: Secondary | ICD-10-CM | POA: Diagnosis not present

## 2021-02-06 DIAGNOSIS — L814 Other melanin hyperpigmentation: Secondary | ICD-10-CM | POA: Diagnosis not present

## 2021-02-06 DIAGNOSIS — Z1283 Encounter for screening for malignant neoplasm of skin: Secondary | ICD-10-CM

## 2021-02-06 DIAGNOSIS — L578 Other skin changes due to chronic exposure to nonionizing radiation: Secondary | ICD-10-CM

## 2021-02-06 DIAGNOSIS — D229 Melanocytic nevi, unspecified: Secondary | ICD-10-CM

## 2021-02-06 DIAGNOSIS — L821 Other seborrheic keratosis: Secondary | ICD-10-CM

## 2021-02-06 DIAGNOSIS — D1800 Hemangioma unspecified site: Secondary | ICD-10-CM

## 2021-02-06 MED ORDER — KETOCONAZOLE 2 % EX CREA: TOPICAL_CREAM | CUTANEOUS | 3 refills | Status: DC

## 2021-02-06 NOTE — Progress Notes (Signed)
   Follow-Up Visit   Subjective  Julie Jennings is a 70 y.o. female who presents for the following: Annual Exam. Patient has noticed lesions on her shoulders/upper back that are bothersome, and she would like them treated.  The patient presents for Total-Body Skin Exam (TBSE) for skin cancer screening and mole check.  The following portions of the chart were reviewed this encounter and updated as appropriate:   Tobacco  Allergies  Meds  Problems  Med Hx  Surg Hx  Fam Hx     Review of Systems:  No other skin or systemic complaints except as noted in HPI or Assessment and Plan.  Objective  Well appearing patient in no apparent distress; mood and affect are within normal limits.  A full examination was performed including scalp, head, eyes, ears, nose, lips, neck, chest, axillae, abdomen, back, buttocks, bilateral upper extremities, bilateral lower extremities, hands, feet, fingers, toes, fingernails, and toenails. All findings within normal limits unless otherwise noted below.  Objective  R neck/shoulder x 20, R forehead x 1, R popliteal x 1 (22): Erythematous keratotic or waxy stuck-on papule or plaque.   Objective  B/L foot: Scaling and maceration web spaces and over distal and lateral soles.    Assessment & Plan  Inflamed seborrheic keratosis (22) R neck/shoulder x 20, R forehead x 1, R popliteal x 1  Destruction of lesion - R neck/shoulder x 20, R forehead x 1, R popliteal x 1 Complexity: simple   Destruction method: cryotherapy   Informed consent: discussed and consent obtained   Timeout:  patient name, date of birth, surgical site, and procedure verified Lesion destroyed using liquid nitrogen: Yes   Region frozen until ice ball extended beyond lesion: Yes   Outcome: patient tolerated procedure well with no complications   Post-procedure details: wound care instructions given    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 Start Ketoconazole 2% cream QHS.    ketoconazole (NIZORAL) 2 % cream - B/L foot  Lentigines - Scattered tan macules - Due to sun exposure - Benign-appering, observe - Recommend daily broad spectrum sunscreen SPF 30+ to sun-exposed areas, reapply every 2 hours as needed. - Call for any changes  Seborrheic Keratoses - Stuck-on, waxy, tan-brown papules and plaques  - Discussed benign etiology and prognosis. - Observe - Call for any changes  Melanocytic Nevi - Tan-brown and/or pink-flesh-colored symmetric macules and papules - Benign appearing on exam today - Observation - Call clinic for new or changing moles - Recommend daily use of broad spectrum spf 30+ sunscreen to sun-exposed areas.   Hemangiomas - Red papules - Discussed benign nature - Observe - Call for any changes  Actinic Damage - Chronic, secondary to cumulative UV/sun exposure - diffuse scaly erythematous macules with underlying dyspigmentation - Recommend daily broad spectrum sunscreen SPF 30+ to sun-exposed areas, reapply every 2 hours as needed.  - Call for new or changing lesions.  Skin cancer screening performed today.  Return in about 3 months (around 05/06/2021) for ISK recheck .  Luther Redo, CMA, am acting as scribe for Sarina Ser, MD .  Documentation: I have reviewed the above documentation for accuracy and completeness, and I agree with the above.  Sarina Ser, MD

## 2021-02-07 ENCOUNTER — Encounter: Payer: Self-pay | Admitting: Dermatology

## 2021-04-13 ENCOUNTER — Other Ambulatory Visit: Payer: Self-pay

## 2021-04-13 ENCOUNTER — Ambulatory Visit: Payer: Medicare PPO | Admitting: Dermatology

## 2021-04-13 DIAGNOSIS — L820 Inflamed seborrheic keratosis: Secondary | ICD-10-CM | POA: Diagnosis not present

## 2021-04-13 DIAGNOSIS — L578 Other skin changes due to chronic exposure to nonionizing radiation: Secondary | ICD-10-CM

## 2021-04-13 DIAGNOSIS — L821 Other seborrheic keratosis: Secondary | ICD-10-CM

## 2021-04-13 NOTE — Patient Instructions (Signed)

## 2021-04-13 NOTE — Progress Notes (Signed)
   Follow-Up Visit   Subjective  Julie Jennings is a 70 y.o. female who presents for the following: ISK follow up  (x 22 R shoulder/neck, R forehead, R popliteal - patient still has rough bumps around her chest and shoulders that she would like treated today and a lesion on her L buttocks crease that scabs over and bleeds).  The following portions of the chart were reviewed this encounter and updated as appropriate:   Tobacco  Allergies  Meds  Problems  Med Hx  Surg Hx  Fam Hx     Review of Systems:  No other skin or systemic complaints except as noted in HPI or Assessment and Plan.  Objective  Well appearing patient in no apparent distress; mood and affect are within normal limits.  A focused examination was performed including face, neck, chest and back and the face, trunk, and extremities. Relevant physical exam findings are noted in the Assessment and Plan.  Objective  Chest and neck x 17, L sacral x 1, R popliteal x 1, L forearm x 1, R upper arm x 1, R antecubital x 1 (22): Erythematous keratotic or waxy stuck-on papule or plaque.   Assessment & Plan  Inflamed seborrheic keratosis (22) Chest and neck x 17, L sacral x 1, R popliteal x 1, L forearm x 1, R upper arm x 1, R antecubital x 1  Destruction of lesion - Chest and neck x 17, L sacral x 1, R popliteal x 1, L forearm x 1, R upper arm x 1, R antecubital x 1 Complexity: simple   Destruction method: cryotherapy   Informed consent: discussed and consent obtained   Timeout:  patient name, date of birth, surgical site, and procedure verified Lesion destroyed using liquid nitrogen: Yes   Region frozen until ice ball extended beyond lesion: Yes   Outcome: patient tolerated procedure well with no complications   Post-procedure details: wound care instructions given    Actinic Damage - chronic, secondary to cumulative UV radiation exposure/sun exposure over time - diffuse scaly erythematous macules with underlying  dyspigmentation - Recommend daily broad spectrum sunscreen SPF 30+ to sun-exposed areas, reapply every 2 hours as needed.  - Recommend staying in the shade or wearing long sleeves, sun glasses (UVA+UVB protection) and wide brim hats (4-inch brim around the entire circumference of the hat). - Call for new or changing lesions.  Seborrheic Keratoses - Stuck-on, waxy, tan-brown papules and/or plaques  - Benign-appearing - Discussed benign etiology and prognosis. - Observe - Call for any changes  Return if symptoms worsen or fail to improve.  Luther Redo, CMA, am acting as scribe for Sarina Ser, MD .  Documentation: I have reviewed the above documentation for accuracy and completeness, and I agree with the above.  Sarina Ser, MD

## 2021-04-17 ENCOUNTER — Encounter: Payer: Self-pay | Admitting: Dermatology

## 2021-04-28 ENCOUNTER — Other Ambulatory Visit: Payer: Self-pay

## 2021-04-28 ENCOUNTER — Emergency Department
Admission: EM | Admit: 2021-04-28 | Discharge: 2021-04-29 | Disposition: A | Discharge status: Home / Self Care | Payer: Medicare PPO | Attending: Emergency Medicine | Admitting: Emergency Medicine

## 2021-04-28 ENCOUNTER — Encounter: Payer: Self-pay | Admitting: Emergency Medicine

## 2021-04-28 DIAGNOSIS — Z7982 Long term (current) use of aspirin: Secondary | ICD-10-CM | POA: Insufficient documentation

## 2021-04-28 DIAGNOSIS — Z79899 Other long term (current) drug therapy: Secondary | ICD-10-CM | POA: Diagnosis not present

## 2021-04-28 DIAGNOSIS — I129 Hypertensive chronic kidney disease with stage 1 through stage 4 chronic kidney disease, or unspecified chronic kidney disease: Secondary | ICD-10-CM | POA: Insufficient documentation

## 2021-04-28 DIAGNOSIS — N189 Chronic kidney disease, unspecified: Secondary | ICD-10-CM | POA: Diagnosis not present

## 2021-04-28 DIAGNOSIS — K625 Hemorrhage of anus and rectum: Secondary | ICD-10-CM | POA: Diagnosis not present

## 2021-04-28 DIAGNOSIS — Z85118 Personal history of other malignant neoplasm of bronchus and lung: Secondary | ICD-10-CM | POA: Insufficient documentation

## 2021-04-28 LAB — CBC
HCT: 38.3 % (ref 36.0–46.0)
Hemoglobin: 13 g/dL (ref 12.0–15.0)
MCH: 32.2 pg (ref 26.0–34.0)
MCHC: 33.9 g/dL (ref 30.0–36.0)
MCV: 94.8 fL (ref 80.0–100.0)
Platelets: 200 10*3/uL (ref 150–400)
RBC: 4.04 MIL/uL (ref 3.87–5.11)
RDW: 13 % (ref 11.5–15.5)
WBC: 7.1 10*3/uL (ref 4.0–10.5)
nRBC: 0 % (ref 0.0–0.2)

## 2021-04-28 LAB — TYPE AND SCREEN
ABO/RH(D): O POS
Antibody Screen: NEGATIVE

## 2021-04-28 LAB — COMPREHENSIVE METABOLIC PANEL
ALT: 70 U/L — ABNORMAL HIGH (ref 0–44)
AST: 82 U/L — ABNORMAL HIGH (ref 15–41)
Albumin: 4.1 g/dL (ref 3.5–5.0)
Alkaline Phosphatase: 139 U/L — ABNORMAL HIGH (ref 38–126)
Anion gap: 9 (ref 5–15)
BUN: 18 mg/dL (ref 8–23)
CO2: 28 mmol/L (ref 22–32)
Calcium: 9.4 mg/dL (ref 8.9–10.3)
Chloride: 102 mmol/L (ref 98–111)
Creatinine, Ser: 0.67 mg/dL (ref 0.44–1.00)
GFR, Estimated: >60 mL/min (ref >60)
Glucose, Bld: 88 mg/dL (ref 70–99)
Potassium: 3.7 mmol/L (ref 3.5–5.1)
Sodium: 139 mmol/L (ref 135–145)
Total Bilirubin: 0.9 mg/dL (ref 0.3–1.2)
Total Protein: 7.7 g/dL (ref 6.5–8.1)

## 2021-04-28 NOTE — ED Provider Notes (Signed)
[facility] Emergency Department Provider Note  ____________________________________________   Event Date/Time   First MD Initiated Contact with Patient 04/28/21 2302     (approximate)  I have reviewed the triage vital signs and the nursing notes.   HISTORY  Chief Complaint Rectal Bleeding    HPI Julie Jennings is a 70 y.o. female with history of lymphoma in remission, paroxysmal A. fib, CVA who presents to the emergency department with complaints of rectal bleeding.  She states in the past 2 weeks she has had single episodes x2 of seeing bright red blood on her toilet paper with wiping.  She states today she had a bowel movement and had bright red blood on the toilet paper with small clots and saw blood in the toilet as well.  States this only occurred once today.  She has not any had any further bleeding or further bowel movements.  No vomiting, melena, abdominal pain.  She has had 3 previous hernia repairs and a cholecystectomy.  Her last colonoscopy was in May 2019 and showed 1 rectal polyp which was removed.  She does have a history of hemorrhoids.  She is on 81 mg of aspirin daily but no anticoagulation.  She denies any chest pain, shortness of breath, dizziness, fever.  She denies any constipation, straining.  States she went to her PCPs office and they recommended that she come to the emergency department.  States she went to the [facility] walk-in clinic and they recommended that she come to the ED as well.  CT states she had a rectal exam in the walk-in clinic and she states they told her it was negative for blood.  Has an appointment with her gastroenterologist in June.        Past Medical History:  Diagnosis Date  . A-fib ([place])    Only once  . Anxiety   . Arthritis    oesteoarthritis  . BP (high blood pressure) 04/16/2014  . Chronic kidney disease    history nephrolithiasis  . Depression   . Dysrhythmia    PSVT  . Endometriosis   . Herpes zoster    . Lung cancer ([place]) left  . Lymphoma ([place])    "stomach"  . Stroke [facility])     Patient Active Problem List   Diagnosis Date Noted  . Post-menopausal bleeding 07/31/2018  . History of screening mammography 09/28/2016  . Allergic rhinitis 09/13/2015  . Cerebral infarction ([place]) 09/13/2015  . Clinical depression 09/13/2015  . Endometriosis 09/13/2015  . Fatty infiltration of liver 09/13/2015  . 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 Menopause 09/13/2015  . Calculus of kidney 09/13/2015  . Arthritis, degenerative 09/13/2015  . Awareness of heartbeats 09/13/2015  . Paroxysmal supraventricular tachycardia ([place]) 09/13/2015  . Abnormal LFTs 04/30/2014  . Accumulation of fluid in tissues 04/30/2014  . Edema 04/30/2014  . Anxiety 04/16/2014  . Personal history of other diseases of the circulatory system 04/16/2014  . BP (high blood pressure) 04/16/2014  . HLD (hyperlipidemia) 04/16/2014  . Lymphoma ([place]) 04/16/2014  . Cancer of upper lobe of left lung ([place]) 06/28/2013    Past Surgical History:  Procedure Laterality Date  . AUGMENTATION MAMMAPLASTY Bilateral   . CHOLECYSTECTOMY    . COLONOSCOPY    . COLONOSCOPY WITH PROPOFOL N/A 04/14/2018   Procedure: COLONOSCOPY WITH PROPOFOL;  Surgeon: Manya Silvas, MD;  Location: [facility] ENDOSCOPY;  Service: Endoscopy;  Laterality: N/A;  . DILATION AND CURETTAGE OF UTERUS    . HEMORRHOIDECTOMY WITH HEMORRHOID  BANDING    . HERNIA REPAIR    . LUNG REMOVAL, PARTIAL Left     Prior to Admission medications   Medication Sig Start Date End Date Taking? Authorizing Provider  ALPRAZolam Duanne Moron) 0.5 MG tablet Take 1 tablet (0.5 mg total) by mouth 3 (three) times daily as needed for anxiety. 10/02/16   Creola Corn, MD  aspirin 81 MG chewable tablet Chew by mouth.    [provider]  Cholecalciferol 50 MCG (2000 UT) TABS Take by mouth.    [provider]  furosemide (LASIX) 40 MG tablet Take by mouth. 09/30/14 04/07/18  [provider]  gabapentin (NEURONTIN) 100 MG capsule Take 100 mg by mouth 3 (three) times daily.    [provider]  ketoconazole (NIZORAL) 2 % cream Apply to the feet QHS 02/06/21   Ralene Bathe, MD  loratadine (CLARITIN) 10 MG tablet Take 1 tablet (10 mg total) by mouth daily. 12/17/16   Cammie Sickle, MD  losartan (COZAAR) 100 MG tablet Take 1 tablet by mouth daily. 06/24/20   [provider]  meloxicam (MOBIC) 15 MG tablet Take by mouth.    [provider]  nebivolol (BYSTOLIC) 10 MG tablet Take by mouth. 09/30/14   [provider]  pantoprazole (PROTONIX) 40 MG tablet Take by mouth. 04/22/17 04/22/18  [provider]  phenazopyridine (PYRIDIUM) 200 MG tablet Take 1 tablet (200 mg total) by mouth 3 (three) times daily as needed for pain. 06/11/18   Jacquelin Hawking, NP  tiotropium (SPIRIVA) 18 MCG inhalation capsule Place into inhaler and inhale. 09/30/14   [provider]  triamcinolone (NASACORT) 55 MCG/ACT AERO nasal inhaler Place 2 sprays into the nose daily.    [provider]  venlafaxine XR (EFFEXOR-XR) 75 MG 24 hr capsule Take by mouth. 07/25/15   [provider]    Allergies Buspirone, Prednisone, and Codeine  Family History  Problem Relation Age of Onset  . Stroke Mother   . Diabetes Mother   . Colon cancer Father   . Prostate cancer Father   . Breast cancer Neg Hx     Social History Social History   Tobacco Use  . Smoking status: Never Smoker  . Smokeless tobacco: Never Used  Vaping Use  . Vaping Use: Never used  Substance Use Topics  . Alcohol use: No  . Drug use: No    Review of Systems Constitutional: No fever. Eyes: No visual changes. ENT: No sore throat. Cardiovascular: Denies chest pain. Respiratory: Denies shortness of breath. Gastrointestinal: No nausea, vomiting, diarrhea. Genitourinary: Negative for dysuria. Musculoskeletal: Negative for back pain. Skin: Negative for  rash. Neurological: Negative for focal weakness or numbness.  ____________________________________________   PHYSICAL EXAM:  VITAL SIGNS: ED Triage Vitals [04/28/21 1810]  Enc Vitals Group     BP (!) 160/103     Pulse Rate 72     Resp 16     Temp 98.4 F (36.9 C)     Temp Source Oral     SpO2 97 %     Weight 205 lb (93 kg)     Height 5\' 7"  (1.702 m)     Head Circumference      Peak Flow      Pain Score 0     Pain Loc      Pain Edu?      Excl. in [place]?    CONSTITUTIONAL: Alert and oriented and responds appropriately to questions. Well-appearing; well-nourished, afebrile, nontoxic,  appears younger than stated age HEAD: Normocephalic EYES: Conjunctivae clear, pupils appear equal, EOM appear intact ENT: normal nose; moist mucous membranes NECK: Supple, normal ROM CARD: RRR; S1 and S2 appreciated; no murmurs, no clicks, no rubs, no gallops RESP: Normal chest excursion without splinting or tachypnea; breath sounds clear and equal bilaterally; no wheezes, no rhonchi, no rales, no hypoxia or respiratory distress, speaking full sentences ABD/GI: Normal bowel sounds; non-distended; soft, non-tender, no rebound, no guarding, no peritoneal signs, no hepatosplenomegaly I reviewed all nursing notes and pertinent previous records as available.  I have reviewed and interpreted any EKGs, lab and urine results, imaging (as available). RECTAL:  Normal rectal tone, no gross blood or melena, guaiac NEGATIVE, 2 small nonthrombosed and nonbleeding external hemorrhoids appreciated, nontender rectal exam, no fecal impaction.  Brown formed stool on rectal exam. BACK: The back appears normal EXT: Normal ROM in all joints; no deformity noted, no edema; no cyanosis SKIN: Normal color for age and race; warm; no rash on exposed skin NEURO: Moves all extremities equally PSYCH: The patient's mood and manner are appropriate.  ____________________________________________   LABS (all labs ordered are listed,  but only abnormal results are displayed)  Labs Reviewed  COMPREHENSIVE METABOLIC PANEL - Abnormal; Notable for the following components:      Result Value   AST 82 (*)    ALT 70 (*)    Alkaline Phosphatase 139 (*)    All other components within normal limits  CBC  POC OCCULT BLOOD, ED  TYPE AND SCREEN   ____________________________________________  EKG   ____________________________________________  RADIOLOGY I, Sharia Averitt, personally viewed and evaluated these images (plain radiographs) as part of my medical decision making, as well as reviewing the written report by the radiologist.  ED MD interpretation:    Official radiology report(s): No results found.  ____________________________________________   PROCEDURES  Procedure(s) performed (including Critical Care):  Procedures  ____________________________________________   INITIAL IMPRESSION / ASSESSMENT AND PLAN / ED COURSE  As part of my medical decision making, I reviewed the following data within the [place] History obtained from family, Nursing notes reviewed and incorporated, Labs reviewed , Old chart reviewed and Notes from prior ED visits         Patient here with 1 episode of rectal bleeding.  She has not had any further episodes.  She has brown stool in her rectal vault which is formed and is guaiac negative.  No abdominal pain.  No vomiting.  Hemoglobin is 13.  Minimally elevated liver function test but this appears chronic for patient.  She is not having any right upper quadrant abdominal pain.  She has an appointment with a GI doctor in less than 1 month.  She does have some external hemorrhoids on exam of these are not bleeding.  Given that these have been isolated incidents that have resolved spontaneously and she is currently asymptomatic with a normal hemoglobin and normal vital signs, I feel she is safe to be discharged home.  We did discuss at length return precautions.   Patient and family are comfortable with this plan.  I do not feel she needs admission at this time or urgent/emergent colonoscopy.  At this time, I do not feel there is any life-threatening condition present. I have reviewed, interpreted and discussed all results (EKG, imaging, lab, urine as appropriate) and exam findings with patient/family. I have reviewed nursing notes and appropriate previous records.  I feel the patient is safe to be discharged  home without further emergent workup and can continue workup as an outpatient as needed. Discussed usual and customary return precautions. Patient/family verbalize understanding and are comfortable with this plan.  Outpatient follow-up has been provided as needed. All questions have been answered.    ____________________________________________   FINAL CLINICAL IMPRESSION(S) / ED DIAGNOSES  Final diagnoses:  Rectal bleeding     ED Discharge Orders    None      *Please note:  Shannie Kroeze was evaluated in Emergency Department on 04/29/2021 for the symptoms described in the history of present illness. She was evaluated in the context of the global COVID-19 pandemic, which necessitated consideration that the patient might be at risk for infection with the SARS-CoV-2 virus that causes COVID-19. Institutional protocols and algorithms that pertain to the evaluation of patients at risk for COVID-19 are in a state of rapid change based on information released by regulatory bodies including the CDC and federal and state organizations. These policies and algorithms were followed during the patient's care in the ED.  Some ED evaluations and interventions may be delayed as a result of limited staffing during and the pandemic.*   Note:  This document was prepared using Dragon voice recognition software and may include unintentional dictation errors.   Anjalina Bergevin, Delice Bison, DO 04/29/21 [phone]

## 2021-04-28 NOTE — ED Triage Notes (Signed)
Pt comes into the ED via POV c/o rectal bleeding.  Pt states she was sent over by her MD because they think it is from high up intead of hemorrhoids.  PT states she is passing multiple blood clots and the toilet bowel is filling with blood as well.  Pt denies any blood thinner use.  Pt states she also had rectal bleeding 3 weeks ago, but it dissipated.  Pt in NAD with even and unlabored respirations.  Pt denies any abdominal pain, SHOB, dizziness.

## 2021-04-29 NOTE — Discharge Instructions (Addendum)
Please follow up with GI as scheduled

## 2021-05-04 ENCOUNTER — Other Ambulatory Visit: Payer: Self-pay | Admitting: Internal Medicine

## 2021-05-04 DIAGNOSIS — Z1231 Encounter for screening mammogram for malignant neoplasm of breast: Secondary | ICD-10-CM

## 2021-07-07 ENCOUNTER — Other Ambulatory Visit: Payer: Self-pay

## 2021-07-07 ENCOUNTER — Inpatient Hospital Stay (HOSPITAL_BASED_OUTPATIENT_CLINIC_OR_DEPARTMENT_OTHER): Payer: Medicare PPO | Admitting: Internal Medicine

## 2021-07-07 ENCOUNTER — Inpatient Hospital Stay: Payer: Medicare PPO | Attending: Internal Medicine

## 2021-07-07 VITALS — BP 134/90 | HR 72 | Temp 98.1°F | Wt 207.0 lb

## 2021-07-07 DIAGNOSIS — N189 Chronic kidney disease, unspecified: Secondary | ICD-10-CM | POA: Diagnosis not present

## 2021-07-07 DIAGNOSIS — Z9221 Personal history of antineoplastic chemotherapy: Secondary | ICD-10-CM | POA: Diagnosis not present

## 2021-07-07 DIAGNOSIS — Z8572 Personal history of non-Hodgkin lymphomas: Secondary | ICD-10-CM | POA: Insufficient documentation

## 2021-07-07 DIAGNOSIS — R300 Dysuria: Secondary | ICD-10-CM | POA: Diagnosis not present

## 2021-07-07 DIAGNOSIS — Z79899 Other long term (current) drug therapy: Secondary | ICD-10-CM | POA: Insufficient documentation

## 2021-07-07 DIAGNOSIS — I4891 Unspecified atrial fibrillation: Secondary | ICD-10-CM | POA: Insufficient documentation

## 2021-07-07 DIAGNOSIS — C3412 Malignant neoplasm of upper lobe, left bronchus or lung: Secondary | ICD-10-CM

## 2021-07-07 DIAGNOSIS — Z85118 Personal history of other malignant neoplasm of bronchus and lung: Secondary | ICD-10-CM | POA: Insufficient documentation

## 2021-07-07 DIAGNOSIS — Z8673 Personal history of transient ischemic attack (TIA), and cerebral infarction without residual deficits: Secondary | ICD-10-CM | POA: Diagnosis not present

## 2021-07-07 LAB — CBC WITH DIFFERENTIAL/PLATELET
Abs Immature Granulocytes: 0.01 10*3/uL (ref 0.00–0.07)
Basophils Absolute: 0.1 10*3/uL (ref 0.0–0.1)
Basophils Relative: 1 %
Eosinophils Absolute: 0.2 10*3/uL (ref 0.0–0.5)
Eosinophils Relative: 3 %
HCT: 39.7 % (ref 36.0–46.0)
Hemoglobin: 13.5 g/dL (ref 12.0–15.0)
Immature Granulocytes: 0 %
Lymphocytes Relative: 26 %
Lymphs Abs: 1.5 10*3/uL (ref 0.7–4.0)
MCH: 32.1 pg (ref 26.0–34.0)
MCHC: 34 g/dL (ref 30.0–36.0)
MCV: 94.3 fL (ref 80.0–100.0)
Monocytes Absolute: 0.5 10*3/uL (ref 0.1–1.0)
Monocytes Relative: 9 %
Neutro Abs: 3.4 10*3/uL (ref 1.7–7.7)
Neutrophils Relative %: 61 %
Platelets: 163 10*3/uL (ref 150–400)
RBC: 4.21 MIL/uL (ref 3.87–5.11)
RDW: 13.1 % (ref 11.5–15.5)
WBC: 5.6 10*3/uL (ref 4.0–10.5)
nRBC: 0 % (ref 0.0–0.2)

## 2021-07-07 LAB — URINALYSIS, COMPLETE (UACMP) WITH MICROSCOPIC
Bacteria, UA: NONE SEEN
Bilirubin Urine: NEGATIVE
Glucose, UA: NEGATIVE mg/dL
Hgb urine dipstick: NEGATIVE
Ketones, ur: NEGATIVE mg/dL
Leukocytes,Ua: NEGATIVE
Nitrite: NEGATIVE
Protein, ur: NEGATIVE mg/dL
Specific Gravity, Urine: 1.014 (ref 1.005–1.030)
pH: 5 (ref 5.0–8.0)

## 2021-07-07 LAB — COMPREHENSIVE METABOLIC PANEL
ALT: 53 U/L — ABNORMAL HIGH (ref 0–44)
AST: 64 U/L — ABNORMAL HIGH (ref 15–41)
Albumin: 3.9 g/dL (ref 3.5–5.0)
Alkaline Phosphatase: 137 U/L — ABNORMAL HIGH (ref 38–126)
Anion gap: 8 (ref 5–15)
BUN: 16 mg/dL (ref 8–23)
CO2: 28 mmol/L (ref 22–32)
Calcium: 9.5 mg/dL (ref 8.9–10.3)
Chloride: 103 mmol/L (ref 98–111)
Creatinine, Ser: 0.63 mg/dL (ref 0.44–1.00)
GFR, Estimated: >60 mL/min (ref >60)
Glucose, Bld: 107 mg/dL — ABNORMAL HIGH (ref 70–99)
Potassium: 4.2 mmol/L (ref 3.5–5.1)
Sodium: 139 mmol/L (ref 135–145)
Total Bilirubin: 0.8 mg/dL (ref 0.3–1.2)
Total Protein: 7.5 g/dL (ref 6.5–8.1)

## 2021-07-07 LAB — LACTATE DEHYDROGENASE: LDH: 173 U/L (ref 98–192)

## 2021-07-07 NOTE — Progress Notes (Signed)
[place] OFFICE PROGRESS NOTE  Patient Care Team: Idelle Crouch, MD as PCP - General (Internal Medicine)  Cancer Staging No matching staging information was found for the patient.   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 Biopsy is suggestive of fatty liver changes  # carcinoma of lung status post left upper lobe resection in July of 2014; Adenocarcinoma T1N0 M0 tumor EGFR positive   Cancer of upper lobe of left lung (HCC)     INTERVAL HISTORY:  Julie Jennings [age] y.o.  female pleasant patient above history of Diffuse large cell lymphoma and also history of stage I lung cancer is here for follow-up.  Patient complains of increased frequency of urination/burning pain during urination.  Otherwise denies any fevers or flank pain or joint pains.  No nausea no vomiting.  No new lumps or bumps.  No night sweats.   Review of Systems  Constitutional:  Negative for chills, diaphoresis, fever, malaise/fatigue and weight loss.  HENT:  Negative for nosebleeds and sore throat.   Eyes:  Negative for double vision.  Respiratory:  Negative for cough, hemoptysis, sputum production, shortness of breath and wheezing.   Cardiovascular:  Negative for chest pain, palpitations, orthopnea and leg swelling.  Gastrointestinal:  Negative for abdominal pain, blood in stool, constipation, diarrhea, heartburn, melena, nausea and vomiting.  Genitourinary:  Positive for dysuria, frequency and urgency.  Musculoskeletal:  Negative for back pain and joint pain.  Skin: Negative.  Negative for itching and rash.  Neurological:  Negative for dizziness, tingling, focal weakness, weakness and headaches.  Endo/Heme/Allergies:  Does not bruise/bleed easily.  Psychiatric/Behavioral:  Negative for depression. The patient is not nervous/anxious and does not have insomnia.     PAST MEDICAL HISTORY :  Past Medical History:   Diagnosis Date   A-fib ([place])    Only once   Anxiety    Arthritis    oesteoarthritis   BP (high blood pressure) 04/16/2014   Chronic kidney disease    history nephrolithiasis   Depression    Dysrhythmia    PSVT   Endometriosis    Herpes zoster    Lung cancer ([place]) left   Lymphoma ([place])    "stomach"   Stroke ([place])     PAST SURGICAL HISTORY :   Past Surgical History:  Procedure Laterality Date   AUGMENTATION MAMMAPLASTY Bilateral    CHOLECYSTECTOMY     COLONOSCOPY     COLONOSCOPY WITH PROPOFOL N/A 04/14/2018   Procedure: COLONOSCOPY WITH PROPOFOL;  Surgeon: Manya Silvas, MD;  Location: [facility] ENDOSCOPY;  Service: Endoscopy;  Laterality: N/A;   DILATION AND CURETTAGE OF UTERUS     HEMORRHOIDECTOMY WITH HEMORRHOID BANDING     HERNIA REPAIR     LUNG REMOVAL, PARTIAL Left     FAMILY HISTORY :   Family History  Problem Relation Age of Onset   Stroke Mother    Diabetes Mother    Colon cancer Father    Prostate cancer Father    Breast cancer Neg Hx     SOCIAL HISTORY:   Social History   Tobacco Use   Smoking status: Never   Smokeless tobacco: Never  Vaping Use   Vaping Use: Never used  Substance Use Topics   Alcohol use: No   Drug use: No    ALLERGIES:  is allergic to buspirone, prednisone, and codeine.  MEDICATIONS:  Current Outpatient Medications  Medication Sig  Dispense Refill   ALPRAZolam (XANAX) 0.5 MG tablet Take 1 tablet (0.5 mg total) by mouth 3 (three) times daily as needed for anxiety. 21 tablet 0   aspirin 81 MG chewable tablet Chew by mouth.     Cholecalciferol 50 MCG (2000 UT) TABS Take by mouth.     furosemide (LASIX) 40 MG tablet Take by mouth.     gabapentin (NEURONTIN) 100 MG capsule Take 100 mg by mouth 3 (three) times daily.     ketoconazole (NIZORAL) 2 % cream Apply to the feet QHS 60 g 3   loratadine (CLARITIN) 10 MG tablet Take 1 tablet (10 mg total) by mouth daily. 90 tablet 3   losartan (COZAAR) 100 MG tablet Take 1 tablet by mouth  daily.     meloxicam (MOBIC) 15 MG tablet Take by mouth.     nebivolol (BYSTOLIC) 10 MG tablet Take by mouth.     pantoprazole (PROTONIX) 40 MG tablet Take by mouth.     phenazopyridine (PYRIDIUM) 200 MG tablet Take 1 tablet (200 mg total) by mouth 3 (three) times daily as needed for pain. 20 tablet 0   tiotropium (SPIRIVA) 18 MCG inhalation capsule Place into inhaler and inhale.     triamcinolone (NASACORT) 55 MCG/ACT AERO nasal inhaler Place 2 sprays into the nose daily.     venlafaxine XR (EFFEXOR-XR) 75 MG 24 hr capsule Take by mouth.     No current facility-administered medications for this visit.    PHYSICAL EXAMINATION: ECOG PERFORMANCE STATUS: 0 - Asymptomatic  BP 134/90   Pulse 72   Temp 98.1 F (36.7 C)   Wt 207 lb (93.9 kg)   BMI 32.42 kg/m   Filed Weights   07/07/21 1102  Weight: 207 lb (93.9 kg)    Physical Exam HENT:     Head: Normocephalic and atraumatic.     Mouth/Throat:     Pharynx: No oropharyngeal exudate.  Eyes:     Pupils: Pupils are equal, round, and reactive to light.  Cardiovascular:     Rate and Rhythm: Normal rate and regular rhythm.  Pulmonary:     Effort: Pulmonary effort is normal. No respiratory distress.     Breath sounds: Normal breath sounds. No wheezing.  Abdominal:     General: Bowel sounds are normal. There is no distension.     Palpations: Abdomen is soft. There is no mass.     Tenderness: There is no abdominal tenderness. There is no guarding or rebound.  Musculoskeletal:        General: No tenderness. Normal range of motion.     Cervical back: Normal range of motion and neck supple.  Skin:    General: Skin is warm.  Neurological:     Mental Status: She is alert and oriented to person, place, and time.  Psychiatric:        Mood and Affect: Affect normal.     LABORATORY DATA:  I have reviewed the data as listed    Component Value Date/Time   NA 139 07/07/2021 1035   NA 139 03/14/2015 0856   K 4.2 07/07/2021 1035   K  4.1 03/14/2015 0856   CL 103 07/07/2021 1035   CL 103 03/14/2015 0856   CO2 28 07/07/2021 1035   CO2 28 03/14/2015 0856   GLUCOSE 107 (H) 07/07/2021 1035   GLUCOSE 111 (H) 03/14/2015 0856   BUN 16 07/07/2021 1035   BUN 19 03/14/2015 0856   CREATININE 0.63 07/07/2021 1035  CREATININE 0.56 03/14/2015 0856   CALCIUM 9.5 07/07/2021 1035   CALCIUM 9.3 03/14/2015 0856   PROT 7.5 07/07/2021 1035   PROT 7.7 03/14/2015 0856   ALBUMIN 3.9 07/07/2021 1035   ALBUMIN 4.3 03/14/2015 0856   AST 64 (H) 07/07/2021 1035   AST 80 (H) 03/14/2015 0856   ALT 53 (H) 07/07/2021 1035   ALT 87 (H) 03/14/2015 0856   ALKPHOS 137 (H) 07/07/2021 1035   ALKPHOS 164 (H) 03/14/2015 0856   BILITOT 0.8 07/07/2021 1035   BILITOT 0.8 03/14/2015 0856   GFRNONAA >60 07/07/2021 1035   GFRNONAA >60 03/14/2015 0856   GFRAA >60 07/07/2020 1041   GFRAA >60 03/14/2015 0856    No results found for: SPEP, UPEP  Lab Results  Component Value Date   WBC 5.6 07/07/2021   NEUTROABS 3.4 07/07/2021   HGB 13.5 07/07/2021   HCT 39.7 07/07/2021   MCV 94.3 07/07/2021   PLT 163 07/07/2021      Chemistry      Component Value Date/Time   NA 139 07/07/2021 1035   NA 139 03/14/2015 0856   K 4.2 07/07/2021 1035   K 4.1 03/14/2015 0856   CL 103 07/07/2021 1035   CL 103 03/14/2015 0856   CO2 28 07/07/2021 1035   CO2 28 03/14/2015 0856   BUN 16 07/07/2021 1035   BUN 19 03/14/2015 0856   CREATININE 0.63 07/07/2021 1035   CREATININE 0.56 03/14/2015 0856      Component Value Date/Time   CALCIUM 9.5 07/07/2021 1035   CALCIUM 9.3 03/14/2015 0856   ALKPHOS 137 (H) 07/07/2021 1035   ALKPHOS 164 (H) 03/14/2015 0856   AST 64 (H) 07/07/2021 1035   AST 80 (H) 03/14/2015 0856   ALT 53 (H) 07/07/2021 1035   ALT 87 (H) 03/14/2015 0856   BILITOT 0.8 07/07/2021 1035   BILITOT 0.8 03/14/2015 0856       RADIOGRAPHIC STUDIES: I have personally reviewed the radiological images as listed and agreed with the findings in the  report. No results found.   ASSESSMENT & PLAN:  Cancer of upper lobe of left lung ([place]) # LUL lung ca- stage I [6812 June]; clinically no evidence of recurrence. Last- CT scan March 2019- NED; STABLE.  No clinical evidence of recurrence.  #Diffuse B-cell lymphoma; s/p chemo; clinically NED.  Likely cured; no imaging needed. STABLE  # Slightly intermittent elevated LFts-?  Fatty liver- recommend decreasing carbs-  Discussed importance of healthy weight/and weight loss.  Strongly recommend eating more green leafy vegetables and cutting down processed food/ carbohydrates.  Instead increasing whole grains / protein in the diet.  Multiple studies have shown that optimal weight would help improve cardiovascular risk; also shown to cut on the risk of malignancies-colon cancer, breast cancer ovarian/uterine cancer in women and also prostate cancer in men.   # [id number] check UA/culture-   # DISPOSITION:print labs # follow up in  12 months- MD- labs- cbc/cmp/ldh- Dr.B      Orders Placed This Encounter  Procedures   Urine culture    Standing Status:   Future    Number of Occurrences:   1    Standing Expiration Date:   07/07/2022   Urinalysis, Complete w Microscopic    Standing Status:   Future    Number of Occurrences:   1    Standing Expiration Date:   07/07/2022   All questions were answered. The patient knows to call the clinic with any problems, questions or  concerns.      Cammie Sickle, MD 07/08/2021 11:35 AM

## 2021-07-07 NOTE — Assessment & Plan Note (Addendum)
#   LUL lung ca- stage I [2014 June]; clinically no evidence of recurrence. Last- CT scan March 2019- NED; STABLE.  No clinical evidence of recurrence.  #Diffuse B-cell lymphoma; s/p chemo; clinically NED.  Likely cured; no imaging needed. STABLE  # Slightly intermittent elevated LFts-?  Fatty liver- recommend decreasing carbs-  Discussed importance of healthy weight/and weight loss.  Strongly recommend eating more green leafy vegetables and cutting down processed food/ carbohydrates.  Instead increasing whole grains / protein in the diet.  Multiple studies have shown that optimal weight would help improve cardiovascular risk; also shown to cut on the risk of malignancies-colon cancer, breast cancer ovarian/uterine cancer in women and also prostate cancer in men.   # [id number] check UA/culture-   # DISPOSITION:print labs # follow up in  12 months- MD- labs- cbc/cmp/ldh- Dr.B

## 2021-07-08 LAB — URINE CULTURE: Culture: <10000 — AB

## 2021-08-02 ENCOUNTER — Other Ambulatory Visit: Payer: Self-pay | Admitting: Physician Assistant

## 2021-08-02 DIAGNOSIS — M23204 Derangement of unspecified medial meniscus due to old tear or injury, left knee: Secondary | ICD-10-CM

## 2021-08-21 ENCOUNTER — Ambulatory Visit
Admission: RE | Admit: 2021-08-21 | Discharge: 2021-08-21 | Disposition: A | Discharge status: Home / Self Care | Payer: Medicare PPO | Source: Ambulatory Visit | Attending: Physician Assistant | Admitting: Physician Assistant

## 2021-08-21 ENCOUNTER — Other Ambulatory Visit: Payer: Self-pay

## 2021-08-21 DIAGNOSIS — M23204 Derangement of unspecified medial meniscus due to old tear or injury, left knee: Secondary | ICD-10-CM | POA: Diagnosis present

## 2021-09-05 ENCOUNTER — Other Ambulatory Visit: Payer: Self-pay | Admitting: Internal Medicine

## 2021-09-05 DIAGNOSIS — R1030 Lower abdominal pain, unspecified: Secondary | ICD-10-CM

## 2021-09-11 ENCOUNTER — Other Ambulatory Visit: Payer: Self-pay | Admitting: Internal Medicine

## 2021-09-11 ENCOUNTER — Other Ambulatory Visit: Payer: Self-pay

## 2021-09-11 ENCOUNTER — Ambulatory Visit
Admission: RE | Admit: 2021-09-11 | Discharge: 2021-09-11 | Disposition: A | Discharge status: Home / Self Care | Payer: Medicare PPO | Source: Ambulatory Visit | Attending: Internal Medicine | Admitting: Internal Medicine

## 2021-09-11 DIAGNOSIS — R1030 Lower abdominal pain, unspecified: Secondary | ICD-10-CM | POA: Diagnosis not present

## 2021-09-19 ENCOUNTER — Ambulatory Visit: Payer: Medicare PPO

## 2021-12-25 ENCOUNTER — Encounter: Payer: Self-pay | Admitting: *Deleted

## 2021-12-25 ENCOUNTER — Other Ambulatory Visit: Payer: Self-pay

## 2021-12-25 ENCOUNTER — Emergency Department: Payer: Medicare PPO

## 2021-12-25 ENCOUNTER — Emergency Department
Admission: EM | Admit: 2021-12-25 | Discharge: 2021-12-25 | Disposition: A | Discharge status: Home / Self Care | Payer: Medicare PPO | Attending: Emergency Medicine | Admitting: Emergency Medicine

## 2021-12-25 DIAGNOSIS — S42295A Other nondisplaced fracture of upper end of left humerus, initial encounter for closed fracture: Secondary | ICD-10-CM | POA: Insufficient documentation

## 2021-12-25 DIAGNOSIS — M79622 Pain in left upper arm: Secondary | ICD-10-CM | POA: Insufficient documentation

## 2021-12-25 DIAGNOSIS — W010XXA Fall on same level from slipping, tripping and stumbling without subsequent striking against object, initial encounter: Secondary | ICD-10-CM | POA: Diagnosis not present

## 2021-12-25 DIAGNOSIS — S4992XA Unspecified injury of left shoulder and upper arm, initial encounter: Secondary | ICD-10-CM | POA: Diagnosis present

## 2021-12-25 MED ORDER — OXYCODONE-ACETAMINOPHEN 5-325 MG PO TABS
1.0000 | ORAL_TABLET | Freq: Once | ORAL | Status: AC
  Administered 2021-12-25: 1 via ORAL
  Filled 2021-12-25: qty 1

## 2021-12-25 MED ORDER — HYDROCODONE-IBUPROFEN 5-200 MG PO TABS: 1.0000 | ORAL_TABLET | Freq: Four times a day (QID) | ORAL | 0 refills | Status: AC | PRN

## 2021-12-25 MED ORDER — ONDANSETRON 4 MG PO TBDP: 4.0000 mg | ORAL_TABLET | Freq: Three times a day (TID) | ORAL | 0 refills | Status: AC | PRN

## 2021-12-25 MED ORDER — ONDANSETRON 4 MG PO TBDP
4.0000 mg | ORAL_TABLET | Freq: Once | ORAL | Status: AC
  Administered 2021-12-25: 4 mg via ORAL
  Filled 2021-12-25: qty 1

## 2021-12-25 MED ORDER — OXYCODONE-ACETAMINOPHEN 5-325 MG PO TABS: 1.0000 | ORAL_TABLET | Freq: Once | ORAL | Status: DC

## 2021-12-25 NOTE — ED Provider Notes (Signed)
[facility] Provider Note  Patient Contact: 9:53 PM (approximate)   History   Arm Injury   HPI  Julie Jennings is a 71 y.o. female presents to the emergency department with left upper extremity pain.  Patient was [place] [employer] tonight when she lost her balance and fell against a wall.  She denies hitting her head or neck.  She has been able to move her left wrist or left elbow since the fall.  She denies blood thinner usage.  She denies chest pain, chest tightness or abdominal pain.      Physical Exam   Triage Vital Signs: ED Triage Vitals  Enc Vitals Group     BP 12/25/21 2056 109/80     Pulse Rate 12/25/21 2056 94     Resp 12/25/21 2056 18     Temp 12/25/21 2056 97.8 F (36.6 C)     Temp Source 12/25/21 2056 Oral     SpO2 12/25/21 2056 96 %     Weight 12/25/21 2057 180 lb (81.6 kg)     Height 12/25/21 2057 5\' 7"  (1.702 m)     Head Circumference --      Peak Flow --      Pain Score 12/25/21 2057 10     Pain Loc --      Pain Edu? --      Excl. in [place]? --     Most recent vital signs: Vitals:   12/25/21 2056 12/25/21 2306  BP: 109/80 103/70  Pulse: 94 77  Resp: 18 20  Temp: 97.8 F (36.6 C)   SpO2: 96% 98%     General: Alert and in no acute distress. Eyes:  PERRL. EOMI. Head: No acute traumatic findings ENT:      Nose: No congestion/rhinnorhea.      Mouth/Throat: Mucous membranes are moist.  Neck: No stridor. No cervical spine tenderness to palpation. Hematological/Lymphatic/Immunilogical: No cervical lymphadenopathy. Cardiovascular:  Good peripheral perfusion Respiratory: Normal respiratory effort without tachypnea or retractions. Lungs CTAB. Good air entry to the bases with no decreased or absent breath sounds. Gastrointestinal: Bowel sounds 4 quadrants. Soft and nontender to palpation. No guarding or rigidity. No palpable masses. No distention. No CVA tenderness. Musculoskeletal: Patient performs limited range of motion at the left  shoulder.  Palpable radial ulnar pulses bilaterally and symmetrically.  Capillary refill less than 2 seconds on the left. Neurologic:  No gross focal neurologic deficits are appreciated.  Skin:   No rash noted Other:   ED Results / Procedures / Treatments   Labs (all labs ordered are listed, but only abnormal results are displayed) Labs Reviewed - No data to display     RADIOLOGY  I personally viewed and evaluated these images as part of my medical decision making, as well as reviewing the written report by the radiologist.  ED Provider Interpretation: Patient has comminuted humeral neck fracture.      MEDICATIONS ORDERED IN ED: Medications  ondansetron (ZOFRAN-ODT) disintegrating tablet 4 mg (4 mg Oral Given 12/25/21 2203)  oxyCODONE-acetaminophen (PERCOCET/ROXICET) 5-325 MG per tablet 1 tablet (1 tablet Oral Given 12/25/21 2204)     IMPRESSION / MDM / ASSESSMENT AND PLAN / ED COURSE  I reviewed the triage vital signs and the nursing notes.                              Differential diagnosis includes, but is not limited to, fracture, rotator  cuff tear, shoulder contusion, laceration...  Assessment and Plan:  Fall:  71 year old female presents to the emergency department with left upper arm pain after mechanical fall.  Vital signs are reassuring at triage.  On physical exam, patient was alert, active and nontoxic-appearing.  X-ray of the left shoulder indicated a comminuted proximal humerus fracture.  Patient was placed in a sling and Percocet was given for pain.  She was discharged with Vicoprofen as patient states that she cannot take very much Tylenol.  She was advised to follow-up with orthopedics, Dr. Genevie Cheshire.  Return precautions were given to return with new or worsening symptoms.     FINAL CLINICAL IMPRESSION(S) / ED DIAGNOSES   Final diagnoses:  Other closed nondisplaced fracture of proximal end of left humerus, initial encounter     Rx / DC Orders   ED  Discharge Orders          Ordered    hydrocodone-ibuprofen (VICOPROFEN) 5-200 MG tablet  Every 6 hours PRN        12/25/21 2248    ondansetron (ZOFRAN-ODT) 4 MG disintegrating tablet  Every 8 hours PRN        12/25/21 2248             Note:  This document was prepared using Dragon voice recognition software and may include unintentional dictation errors.   Vallarie Mare [place], PA-C 12/25/21 2313    Nena Polio, MD 12/26/21 [phone]

## 2021-12-25 NOTE — ED Triage Notes (Signed)
Pt fell into a wall.  Pt has left upper arm pain.  Pt denies neck or back pain.  Pt alert  speech clear.

## 2021-12-25 NOTE — Discharge Instructions (Signed)
Please make follow-up appointment with orthopedics. You can take Vicoprofen for pain. Please start stool softener as Vicoprofen can be constipating. You can take Zofran with Vicoprofen to avoid nausea.

## 2021-12-26 ENCOUNTER — Telehealth: Payer: Self-pay | Admitting: Emergency Medicine

## 2021-12-26 MED ORDER — OXYCODONE HCL 5 MG PO TABS: 5.0000 mg | ORAL_TABLET | ORAL | 0 refills | Status: DC | PRN

## 2021-12-26 MED ORDER — HYDROCODONE-ACETAMINOPHEN 5-325 MG PO TABS: 1.0000 | ORAL_TABLET | ORAL | 0 refills | Status: DC | PRN

## 2021-12-26 NOTE — Telephone Encounter (Signed)
Called back by pharmacy regarding patient's new prescription for Norco and found that patient is refusing to take any Tylenol due to previous liver issues.  Patient called in prescription for oxycodone alone.

## 2021-12-26 NOTE — Telephone Encounter (Signed)
Provider called by pharmacy regarding hydrocodone-ibuprofen ordered.  This is not stocked in the pharmacy and therefore we will change prescription to hydrocodone-acetaminophen

## 2021-12-27 ENCOUNTER — Other Ambulatory Visit: Payer: Self-pay | Admitting: Orthopedic Surgery

## 2021-12-27 ENCOUNTER — Ambulatory Visit: Admission: RE | Admit: 2021-12-27 | Payer: Medicare PPO | Source: Ambulatory Visit

## 2021-12-27 DIAGNOSIS — S42212A Unspecified displaced fracture of surgical neck of left humerus, initial encounter for closed fracture: Secondary | ICD-10-CM

## 2021-12-28 ENCOUNTER — Other Ambulatory Visit: Payer: Self-pay | Admitting: Orthopedic Surgery

## 2021-12-28 ENCOUNTER — Ambulatory Visit
Admission: RE | Admit: 2021-12-28 | Discharge: 2021-12-28 | Disposition: A | Discharge status: Home / Self Care | Payer: Medicare PPO | Source: Ambulatory Visit | Attending: Orthopedic Surgery | Admitting: Orthopedic Surgery

## 2021-12-28 ENCOUNTER — Other Ambulatory Visit: Payer: Self-pay

## 2021-12-28 DIAGNOSIS — S42212A Unspecified displaced fracture of surgical neck of left humerus, initial encounter for closed fracture: Secondary | ICD-10-CM | POA: Diagnosis present

## 2022-01-02 ENCOUNTER — Other Ambulatory Visit: Payer: Self-pay | Admitting: Orthopedic Surgery

## 2022-01-03 ENCOUNTER — Other Ambulatory Visit
Admission: RE | Admit: 2022-01-03 | Discharge: 2022-01-03 | Disposition: A | Discharge status: Home / Self Care | Payer: Medicare PPO | Source: Ambulatory Visit | Attending: Orthopedic Surgery | Admitting: Orthopedic Surgery

## 2022-01-03 ENCOUNTER — Encounter
Admission: RE | Admit: 2022-01-03 | Discharge: 2022-01-03 | Disposition: A | Discharge status: Home / Self Care | Payer: Medicare PPO | Source: Ambulatory Visit | Attending: Orthopedic Surgery | Admitting: Orthopedic Surgery

## 2022-01-03 ENCOUNTER — Other Ambulatory Visit: Payer: Self-pay

## 2022-01-03 VITALS — BP 134/76 | HR 74 | Resp 16 | Ht 65.0 in | Wt 206.0 lb

## 2022-01-03 DIAGNOSIS — Z20822 Contact with and (suspected) exposure to covid-19: Secondary | ICD-10-CM | POA: Insufficient documentation

## 2022-01-03 DIAGNOSIS — Z01812 Encounter for preprocedural laboratory examination: Secondary | ICD-10-CM

## 2022-01-03 DIAGNOSIS — Z01818 Encounter for other preprocedural examination: Secondary | ICD-10-CM | POA: Insufficient documentation

## 2022-01-03 DIAGNOSIS — Z01810 Encounter for preprocedural cardiovascular examination: Secondary | ICD-10-CM | POA: Diagnosis not present

## 2022-01-03 HISTORY — DX: Personal history of urinary calculi: Z87.442

## 2022-01-03 LAB — CBC WITH DIFFERENTIAL/PLATELET
Abs Immature Granulocytes: 0.02 10*3/uL (ref 0.00–0.07)
Basophils Absolute: 0.1 10*3/uL (ref 0.0–0.1)
Basophils Relative: 1 %
Eosinophils Absolute: 0.1 10*3/uL (ref 0.0–0.5)
Eosinophils Relative: 2 %
HCT: 34.8 % — ABNORMAL LOW (ref 36.0–46.0)
Hemoglobin: 11.7 g/dL — ABNORMAL LOW (ref 12.0–15.0)
Immature Granulocytes: 0 %
Lymphocytes Relative: 17 %
Lymphs Abs: 1.2 10*3/uL (ref 0.7–4.0)
MCH: 32 pg (ref 26.0–34.0)
MCHC: 33.6 g/dL (ref 30.0–36.0)
MCV: 95.1 fL (ref 80.0–100.0)
Monocytes Absolute: 0.4 10*3/uL (ref 0.1–1.0)
Monocytes Relative: 5 %
Neutro Abs: 5.4 10*3/uL (ref 1.7–7.7)
Neutrophils Relative %: 75 %
Platelets: 199 10*3/uL (ref 150–400)
RBC: 3.66 MIL/uL — ABNORMAL LOW (ref 3.87–5.11)
RDW: 12.9 % (ref 11.5–15.5)
WBC: 7.1 10*3/uL (ref 4.0–10.5)
nRBC: 0 % (ref 0.0–0.2)

## 2022-01-03 LAB — TYPE AND SCREEN
ABO/RH(D): O POS
Antibody Screen: NEGATIVE

## 2022-01-03 LAB — COMPREHENSIVE METABOLIC PANEL
ALT: 42 U/L (ref 0–44)
AST: 50 U/L — ABNORMAL HIGH (ref 15–41)
Albumin: 3.4 g/dL — ABNORMAL LOW (ref 3.5–5.0)
Alkaline Phosphatase: 124 U/L (ref 38–126)
Anion gap: 9 (ref 5–15)
BUN: 15 mg/dL (ref 8–23)
CO2: 25 mmol/L (ref 22–32)
Calcium: 9 mg/dL (ref 8.9–10.3)
Chloride: 103 mmol/L (ref 98–111)
Creatinine, Ser: 0.5 mg/dL (ref 0.44–1.00)
GFR, Estimated: >60 mL/min (ref >60)
Glucose, Bld: 147 mg/dL — ABNORMAL HIGH (ref 70–99)
Potassium: 3.7 mmol/L (ref 3.5–5.1)
Sodium: 137 mmol/L (ref 135–145)
Total Bilirubin: 0.9 mg/dL (ref 0.3–1.2)
Total Protein: 6.8 g/dL (ref 6.5–8.1)

## 2022-01-03 LAB — URINALYSIS, ROUTINE W REFLEX MICROSCOPIC
Bacteria, UA: NONE SEEN
Bilirubin Urine: NEGATIVE
Glucose, UA: NEGATIVE mg/dL
Hgb urine dipstick: NEGATIVE
Ketones, ur: NEGATIVE mg/dL
Leukocytes,Ua: NEGATIVE
Nitrite: NEGATIVE
Protein, ur: NEGATIVE mg/dL
Specific Gravity, Urine: 1.015 (ref 1.005–1.030)
pH: 5.5 (ref 5.0–8.0)

## 2022-01-03 LAB — SURGICAL PCR SCREEN
MRSA, PCR: NEGATIVE
Staphylococcus aureus: NEGATIVE

## 2022-01-03 NOTE — Patient Instructions (Addendum)
Your procedure is scheduled QP:YPPJKDTO January 04, 2022. Report to Day Surgery inside [facility] 2nd floor. To find out your arrival time please call [phone] between 1PM - 3PM on Wednesday January 03, 2022.  Remember: Instructions that are not followed completely may result in serious medical risk,  up to and including death, or upon the discretion of your surgeon and anesthesiologist your  surgery may need to be rescheduled.     _X__ 1. Do not eat food after midnight the night before your procedure.                 No chewing gum or hard candies. You may drink clear liquids up to 2 hours                 before you are scheduled to arrive for your surgery- DO not drink clear                 liquids within 2 hours of the start of your surgery.                 Clear Liquids include:  water, apple juice without pulp, clear Gatorade, G2 or                  Gatorade Zero (avoid Red/Purple/Blue), Black Coffee or Tea (Do not add                 anything to coffee or tea).  __X__2.   Complete the "Ensure Clear Pre-surgery Clear Carbohydrate Drink" provided to you, 2 hours before arrival. **If you are diabetic you will be provided with an alternative drink, Gatorade Zero or G2.  __X__3.  On the morning of surgery brush your teeth with toothpaste and water, you                may rinse your mouth with mouthwash if you wish.  Do not swallow any toothpaste of mouthwash.     _X__ 4.  No Alcohol for 24 hours before or after surgery.   _X__ 5.  Do Not Smoke or use e-cigarettes For 24 Hours Prior to Your Surgery.                 Do not use any chewable tobacco products for at least 6 hours prior to                 Surgery.  _X__  6.  Do not use any recreational drugs (marijuana, cocaine, heroin, ecstasy, MDMA or other)                For at least one week prior to your surgery.  Combination of these drugs with anesthesia                May have life threatening  results.  ____  7.  Bring all medications with you on the day of surgery if instructed.   __X__8.  Notify your doctor if there is any change in your medical condition      (cold, fever, infections).     Do not wear jewelry, make-up, hairpins, clips or nail polish. Do not wear lotions, powders, or perfumes. You may wear deodorant. Do not shave 48 hours prior to surgery. Men may shave face and neck. Do not bring valuables to the hospital.    [facility] is not responsible for any belongings or valuables.  Contacts, dentures or bridgework may not be worn into surgery. Leave  your suitcase in the car. After surgery it may be brought to your room. For patients admitted to the hospital, discharge time is determined by your treatment team.   Patients discharged the day of surgery will not be allowed to drive home.   Make arrangements for someone to be with you for the first 24 hours of your Same Day Discharge.   __X__ Take these medicines the morning of surgery with A SIP OF WATER:    1. ALPRAZolam (XANAX) 0.5 MG   2. nebivolol (BYSTOLIC) 10 MG  3. pantoprazole (PROTONIX) 40 MG  4. venlafaxine XR (EFFEXOR-XR) 75  5.   6.  ____ Fleet Enema (as directed)   __X__ Use CHG Soap (or wipes) as directed  ____ Use Benzoyl Peroxide Gel as instructed  __X__ Use inhalers on the day of surgery  tiotropium (SPIRIVA) 18 MCG inhalation   triamcinolone (NASACORT) 55 MCG/ACT AERO nasal inhaler  ____ Stop metformin 2 days prior to surgery    ____ Take 1/2 of usual insulin dose the night before surgery. No insulin the morning          of surgery.   ____ Call your PCP, cardiologist, or Pulmonologist if taking Coumadin/Plavix/aspirin and ask when to stop before your surgery.   __X__ One Week prior to surgery- Stop Anti-inflammatories such as Ibuprofen, Aleve, Advil, Motrin, meloxicam (MOBIC), diclofenac, etodolac, ketorolac, Toradol, Daypro, piroxicam, Goody's or BC powders.    __X__ Stop  supplements until after surgery.    ____ Bring C-Pap to the hospital.    If you have any questions regarding your pre-procedure instructions,  Please call Pre-admit Testing at [phone]

## 2022-01-04 ENCOUNTER — Inpatient Hospital Stay: Payer: Medicare PPO | Admitting: Certified Registered"

## 2022-01-04 ENCOUNTER — Other Ambulatory Visit: Payer: Self-pay

## 2022-01-04 ENCOUNTER — Inpatient Hospital Stay: Payer: Medicare PPO

## 2022-01-04 ENCOUNTER — Inpatient Hospital Stay
Admission: RE | Admit: 2022-01-04 | Discharge: 2022-01-05 | DRG: 483 | Disposition: A | Discharge status: Home Health | Payer: Medicare PPO | Attending: Orthopedic Surgery | Admitting: Orthopedic Surgery

## 2022-01-04 ENCOUNTER — Encounter
Admission: RE | Disposition: A | Discharge status: Home / Self Care | Payer: Self-pay | Source: Home / Self Care | Attending: Orthopedic Surgery

## 2022-01-04 ENCOUNTER — Encounter: Payer: Self-pay | Admitting: Orthopedic Surgery

## 2022-01-04 DIAGNOSIS — Z791 Long term (current) use of non-steroidal anti-inflammatories (NSAID): Secondary | ICD-10-CM | POA: Diagnosis not present

## 2022-01-04 DIAGNOSIS — Z79899 Other long term (current) drug therapy: Secondary | ICD-10-CM

## 2022-01-04 DIAGNOSIS — Z85118 Personal history of other malignant neoplasm of bronchus and lung: Secondary | ICD-10-CM

## 2022-01-04 DIAGNOSIS — Z96619 Presence of unspecified artificial shoulder joint: Secondary | ICD-10-CM

## 2022-01-04 DIAGNOSIS — Z886 Allergy status to analgesic agent status: Secondary | ICD-10-CM | POA: Diagnosis not present

## 2022-01-04 DIAGNOSIS — Z885 Allergy status to narcotic agent status: Secondary | ICD-10-CM | POA: Diagnosis not present

## 2022-01-04 DIAGNOSIS — S42231A 3-part fracture of surgical neck of right humerus, initial encounter for closed fracture: Principal | ICD-10-CM | POA: Diagnosis present

## 2022-01-04 DIAGNOSIS — Z20822 Contact with and (suspected) exposure to covid-19: Secondary | ICD-10-CM | POA: Diagnosis present

## 2022-01-04 DIAGNOSIS — Z8673 Personal history of transient ischemic attack (TIA), and cerebral infarction without residual deficits: Secondary | ICD-10-CM | POA: Diagnosis not present

## 2022-01-04 DIAGNOSIS — Z888 Allergy status to other drugs, medicaments and biological substances status: Secondary | ICD-10-CM | POA: Diagnosis not present

## 2022-01-04 DIAGNOSIS — Z8572 Personal history of non-Hodgkin lymphomas: Secondary | ICD-10-CM | POA: Diagnosis not present

## 2022-01-04 DIAGNOSIS — X58XXXA Exposure to other specified factors, initial encounter: Secondary | ICD-10-CM | POA: Diagnosis present

## 2022-01-04 DIAGNOSIS — F32A Depression, unspecified: Secondary | ICD-10-CM | POA: Diagnosis present

## 2022-01-04 DIAGNOSIS — Z7982 Long term (current) use of aspirin: Secondary | ICD-10-CM | POA: Diagnosis not present

## 2022-01-04 DIAGNOSIS — S42209A Unspecified fracture of upper end of unspecified humerus, initial encounter for closed fracture: Secondary | ICD-10-CM | POA: Diagnosis present

## 2022-01-04 DIAGNOSIS — Z419 Encounter for procedure for purposes other than remedying health state, unspecified: Secondary | ICD-10-CM

## 2022-01-04 DIAGNOSIS — S42239A 3-part fracture of surgical neck of unspecified humerus, initial encounter for closed fracture: Secondary | ICD-10-CM | POA: Diagnosis present

## 2022-01-04 HISTORY — PX: REVERSE SHOULDER ARTHROPLASTY: SHX5054

## 2022-01-04 LAB — SARS CORONAVIRUS 2 (TAT 6-24 HRS): SARS Coronavirus 2: NEGATIVE

## 2022-01-04 SURGERY — ARTHROPLASTY, SHOULDER, TOTAL, REVERSE
Anesthesia: General | Site: Shoulder | Laterality: Left

## 2022-01-04 MED ORDER — BUPIVACAINE LIPOSOME 1.3 % IJ SUSP
INTRAMUSCULAR | Status: AC
  Filled 2022-01-04: qty 10

## 2022-01-04 MED ORDER — VENLAFAXINE HCL ER 75 MG PO CP24
75.0000 mg | ORAL_CAPSULE | Freq: Two times a day (BID) | ORAL | Status: DC
  Administered 2022-01-04 – 2022-01-05 (×2): 75 mg via ORAL
  Filled 2022-01-04 (×4): qty 1

## 2022-01-04 MED ORDER — FENTANYL CITRATE PF 50 MCG/ML IJ SOSY
PREFILLED_SYRINGE | INTRAMUSCULAR | Status: AC
  Administered 2022-01-04: 50 ug via INTRAVENOUS
  Filled 2022-01-04: qty 1

## 2022-01-04 MED ORDER — SODIUM CHLORIDE 0.9 % IR SOLN: Status: DC | PRN

## 2022-01-04 MED ORDER — NEBIVOLOL HCL 10 MG PO TABS
10.0000 mg | ORAL_TABLET | Freq: Every day | ORAL | Status: DC
Start: 2022-01-05 — End: 2022-01-05
  Administered 2022-01-05: 10 mg via ORAL
  Filled 2022-01-04: qty 1

## 2022-01-04 MED ORDER — TRANEXAMIC ACID-NACL 1000-0.7 MG/100ML-% IV SOLN
INTRAVENOUS | Status: AC
  Filled 2022-01-04: qty 100

## 2022-01-04 MED ORDER — CEFAZOLIN SODIUM-DEXTROSE 2-4 GM/100ML-% IV SOLN
2.0000 g | Freq: Four times a day (QID) | INTRAVENOUS | Status: AC
  Administered 2022-01-04 – 2022-01-05 (×3): 2 g via INTRAVENOUS
  Filled 2022-01-04 (×3): qty 100

## 2022-01-04 MED ORDER — ERYTHROMYCIN 5 MG/GM OP OINT
1.0000 "application " | TOPICAL_OINTMENT | Freq: Every day | OPHTHALMIC | Status: DC | PRN
  Filled 2022-01-04: qty 1

## 2022-01-04 MED ORDER — LACTATED RINGERS IV SOLN: INTRAVENOUS | Status: DC

## 2022-01-04 MED ORDER — ONDANSETRON HCL 4 MG/2ML IJ SOLN: 4.0000 mg | Freq: Once | INTRAMUSCULAR | Status: DC | PRN

## 2022-01-04 MED ORDER — FENTANYL CITRATE (PF) 100 MCG/2ML IJ SOLN
INTRAMUSCULAR | Status: DC | PRN
  Administered 2022-01-04 (×2): 25 ug via INTRAVENOUS
  Administered 2022-01-04: 50 ug via INTRAVENOUS

## 2022-01-04 MED ORDER — HYDROMORPHONE HCL 1 MG/ML IJ SOLN: 0.2000 mg | INTRAMUSCULAR | Status: DC | PRN

## 2022-01-04 MED ORDER — PHENYLEPHRINE HCL (PRESSORS) 10 MG/ML IV SOLN
INTRAVENOUS | Status: AC
  Filled 2022-01-04: qty 1

## 2022-01-04 MED ORDER — TRAMADOL HCL 50 MG PO TABS
50.0000 mg | ORAL_TABLET | Freq: Four times a day (QID) | ORAL | Status: DC
  Administered 2022-01-04 – 2022-01-05 (×3): 50 mg via ORAL
  Filled 2022-01-04 (×3): qty 1

## 2022-01-04 MED ORDER — OXYCODONE HCL 5 MG PO TABS: 10.0000 mg | ORAL_TABLET | ORAL | Status: DC | PRN

## 2022-01-04 MED ORDER — NEOMYCIN-POLYMYXIN B GU 40-200000 IR SOLN
Status: AC
  Filled 2022-01-04: qty 20

## 2022-01-04 MED ORDER — MIDAZOLAM HCL 2 MG/2ML IJ SOLN
INTRAMUSCULAR | Status: AC
  Administered 2022-01-04: 1 mg via INTRAVENOUS
  Filled 2022-01-04: qty 2

## 2022-01-04 MED ORDER — ROCURONIUM BROMIDE 100 MG/10ML IV SOLN
INTRAVENOUS | Status: DC | PRN
  Administered 2022-01-04: 50 mg via INTRAVENOUS

## 2022-01-04 MED ORDER — FENTANYL CITRATE (PF) 100 MCG/2ML IJ SOLN: 25.0000 ug | INTRAMUSCULAR | Status: DC | PRN

## 2022-01-04 MED ORDER — ORAL CARE MOUTH RINSE: 15.0000 mL | Freq: Once | OROMUCOSAL | Status: AC

## 2022-01-04 MED ORDER — TRANEXAMIC ACID-NACL 1000-0.7 MG/100ML-% IV SOLN: 1000.0000 mg | Freq: Once | INTRAVENOUS | Status: AC

## 2022-01-04 MED ORDER — METOCLOPRAMIDE HCL 5 MG/ML IJ SOLN: 5.0000 mg | Freq: Three times a day (TID) | INTRAMUSCULAR | Status: DC | PRN

## 2022-01-04 MED ORDER — LOSARTAN POTASSIUM 50 MG PO TABS
100.0000 mg | ORAL_TABLET | Freq: Every day | ORAL | Status: DC
  Administered 2022-01-05: 100 mg via ORAL
  Filled 2022-01-04: qty 2

## 2022-01-04 MED ORDER — TRANEXAMIC ACID-NACL 1000-0.7 MG/100ML-% IV SOLN
1000.0000 mg | INTRAVENOUS | Status: AC
  Administered 2022-01-04: 1000 mg via INTRAVENOUS

## 2022-01-04 MED ORDER — VANCOMYCIN HCL 1000 MG IV SOLR
INTRAVENOUS | Status: AC
  Filled 2022-01-04: qty 20

## 2022-01-04 MED ORDER — ASPIRIN EC 325 MG PO TBEC
325.0000 mg | DELAYED_RELEASE_TABLET | Freq: Every day | ORAL | Status: DC
  Administered 2022-01-05: 325 mg via ORAL
  Filled 2022-01-04: qty 1

## 2022-01-04 MED ORDER — VANCOMYCIN HCL 1000 MG IV SOLR
INTRAVENOUS | Status: DC | PRN
  Administered 2022-01-04: 1000 mg via TOPICAL

## 2022-01-04 MED ORDER — OXYCODONE HCL 5 MG PO TABS
5.0000 mg | ORAL_TABLET | ORAL | Status: DC | PRN
  Administered 2022-01-05: 10 mg via ORAL
  Filled 2022-01-04: qty 2

## 2022-01-04 MED ORDER — ALPRAZOLAM 0.5 MG PO TABS
0.5000 mg | ORAL_TABLET | Freq: Three times a day (TID) | ORAL | Status: DC | PRN
  Administered 2022-01-04: 0.5 mg via ORAL
  Filled 2022-01-04: qty 1

## 2022-01-04 MED ORDER — LIDOCAINE HCL (PF) 1 % IJ SOLN
INTRAMUSCULAR | Status: AC
  Filled 2022-01-04: qty 5

## 2022-01-04 MED ORDER — CHLORHEXIDINE GLUCONATE 0.12 % MT SOLN
OROMUCOSAL | Status: AC
  Administered 2022-01-04: 15 mL via OROMUCOSAL
  Filled 2022-01-04: qty 15

## 2022-01-04 MED ORDER — SUCCINYLCHOLINE CHLORIDE 200 MG/10ML IV SOSY
PREFILLED_SYRINGE | INTRAVENOUS | Status: DC | PRN
  Administered 2022-01-04: 80 mg via INTRAVENOUS

## 2022-01-04 MED ORDER — ONDANSETRON HCL 4 MG/2ML IJ SOLN
INTRAMUSCULAR | Status: DC | PRN
  Administered 2022-01-04 (×2): 4 mg via INTRAVENOUS

## 2022-01-04 MED ORDER — ALUM & MAG HYDROXIDE-SIMETH 200-200-20 MG/5ML PO SUSP: 30.0000 mL | ORAL | Status: DC | PRN

## 2022-01-04 MED ORDER — BUPIVACAINE HCL (PF) 0.5 % IJ SOLN
INTRAMUSCULAR | Status: AC
  Filled 2022-01-04: qty 10

## 2022-01-04 MED ORDER — LIDOCAINE HCL (CARDIAC) PF 100 MG/5ML IV SOSY
PREFILLED_SYRINGE | INTRAVENOUS | Status: DC | PRN
  Administered 2022-01-04: 40 mg via INTRAVENOUS

## 2022-01-04 MED ORDER — BISACODYL 10 MG RE SUPP: 10.0000 mg | Freq: Every day | RECTAL | Status: DC | PRN

## 2022-01-04 MED ORDER — SUGAMMADEX SODIUM 500 MG/5ML IV SOLN
INTRAVENOUS | Status: DC | PRN
  Administered 2022-01-04: 200 mg via INTRAVENOUS

## 2022-01-04 MED ORDER — MIDAZOLAM HCL 2 MG/2ML IJ SOLN: 1.0000 mg | Freq: Once | INTRAMUSCULAR | Status: AC

## 2022-01-04 MED ORDER — METOCLOPRAMIDE HCL 10 MG PO TABS: 5.0000 mg | ORAL_TABLET | Freq: Three times a day (TID) | ORAL | Status: DC | PRN

## 2022-01-04 MED ORDER — SODIUM CHLORIDE 0.9 % IV SOLN: INTRAVENOUS | Status: DC

## 2022-01-04 MED ORDER — CEFAZOLIN SODIUM-DEXTROSE 2-4 GM/100ML-% IV SOLN
INTRAVENOUS | Status: AC
  Filled 2022-01-04: qty 100

## 2022-01-04 MED ORDER — CEFAZOLIN SODIUM-DEXTROSE 2-4 GM/100ML-% IV SOLN
2.0000 g | INTRAVENOUS | Status: AC
  Administered 2022-01-04: 2 g via INTRAVENOUS

## 2022-01-04 MED ORDER — PANTOPRAZOLE SODIUM 40 MG PO TBEC
40.0000 mg | DELAYED_RELEASE_TABLET | Freq: Two times a day (BID) | ORAL | Status: DC
  Administered 2022-01-04 – 2022-01-05 (×2): 40 mg via ORAL
  Filled 2022-01-04 (×2): qty 1

## 2022-01-04 MED ORDER — ONDANSETRON HCL 4 MG PO TABS: 4.0000 mg | ORAL_TABLET | Freq: Four times a day (QID) | ORAL | Status: DC | PRN

## 2022-01-04 MED ORDER — FENTANYL CITRATE PF 50 MCG/ML IJ SOSY: 50.0000 ug | PREFILLED_SYRINGE | Freq: Once | INTRAMUSCULAR | Status: AC

## 2022-01-04 MED ORDER — FENTANYL CITRATE (PF) 100 MCG/2ML IJ SOLN
INTRAMUSCULAR | Status: AC
  Filled 2022-01-04: qty 2

## 2022-01-04 MED ORDER — DEXAMETHASONE SODIUM PHOSPHATE 10 MG/ML IJ SOLN
INTRAMUSCULAR | Status: DC | PRN
  Administered 2022-01-04: 10 mg via INTRAVENOUS

## 2022-01-04 MED ORDER — TRANEXAMIC ACID-NACL 1000-0.7 MG/100ML-% IV SOLN
INTRAVENOUS | Status: AC
  Administered 2022-01-04: 1000 mg via INTRAVENOUS
  Filled 2022-01-04: qty 100

## 2022-01-04 MED ORDER — GLYCOPYRROLATE 0.2 MG/ML IJ SOLN
INTRAMUSCULAR | Status: DC | PRN
  Administered 2022-01-04: .2 mg via INTRAVENOUS

## 2022-01-04 MED ORDER — PROPOFOL 10 MG/ML IV BOLUS
INTRAVENOUS | Status: DC | PRN
  Administered 2022-01-04: 120 mg via INTRAVENOUS

## 2022-01-04 MED ORDER — PHENOL 1.4 % MT LIQD
1.0000 | OROMUCOSAL | Status: DC | PRN
  Filled 2022-01-04: qty 177

## 2022-01-04 MED ORDER — ONDANSETRON HCL 4 MG/2ML IJ SOLN: 4.0000 mg | Freq: Four times a day (QID) | INTRAMUSCULAR | Status: DC | PRN

## 2022-01-04 MED ORDER — SENNOSIDES-DOCUSATE SODIUM 8.6-50 MG PO TABS: 1.0000 | ORAL_TABLET | Freq: Every evening | ORAL | Status: DC | PRN

## 2022-01-04 MED ORDER — CHLORHEXIDINE GLUCONATE 0.12 % MT SOLN: 15.0000 mL | Freq: Once | OROMUCOSAL | Status: AC

## 2022-01-04 MED ORDER — TIOTROPIUM BROMIDE MONOHYDRATE 18 MCG IN CAPS
18.0000 ug | ORAL_CAPSULE | Freq: Every day | RESPIRATORY_TRACT | Status: DC
  Administered 2022-01-05: 18 ug via RESPIRATORY_TRACT
  Filled 2022-01-04: qty 5

## 2022-01-04 MED ORDER — MENTHOL 3 MG MT LOZG
1.0000 | LOZENGE | OROMUCOSAL | Status: DC | PRN
  Filled 2022-01-04: qty 9

## 2022-01-04 MED ORDER — ACETAMINOPHEN 10 MG/ML IV SOLN
INTRAVENOUS | Status: AC
  Filled 2022-01-04: qty 100

## 2022-01-04 MED ORDER — DOCUSATE SODIUM 100 MG PO CAPS
100.0000 mg | ORAL_CAPSULE | Freq: Two times a day (BID) | ORAL | Status: DC
Start: 2022-01-04 — End: 2022-01-05
  Administered 2022-01-04 – 2022-01-05 (×2): 100 mg via ORAL
  Filled 2022-01-04 (×2): qty 1

## 2022-01-04 MED ORDER — PHENYLEPHRINE HCL-NACL 20-0.9 MG/250ML-% IV SOLN
INTRAVENOUS | Status: DC | PRN
Start: 2022-01-04 — End: 2022-01-04
  Administered 2022-01-04: 15 ug/min via INTRAVENOUS

## 2022-01-04 MED ORDER — TRIAMCINOLONE ACETONIDE 55 MCG/ACT NA AERO
2.0000 | INHALATION_SPRAY | Freq: Every day | NASAL | Status: DC
  Administered 2022-01-05: 2 via NASAL
  Filled 2022-01-04: qty 21.6

## 2022-01-04 MED ORDER — POLYVINYL ALCOHOL 1.4 % OP SOLN
1.0000 [drp] | OPHTHALMIC | Status: DC | PRN
  Administered 2022-01-04: 1 [drp] via OPHTHALMIC
  Filled 2022-01-04 (×2): qty 15

## 2022-01-04 MED ORDER — POLYETHYL GLYCOL-PROPYL GLYCOL 0.4-0.3 % OP SOLN: 1.0000 [drp] | Freq: Every day | OPHTHALMIC | Status: DC | PRN

## 2022-01-04 MED ORDER — KETOCONAZOLE 2 % EX CREA
TOPICAL_CREAM | Freq: Two times a day (BID) | CUTANEOUS | Status: DC | PRN
  Filled 2022-01-04: qty 15

## 2022-01-04 SURGICAL SUPPLY — 89 items
BASEPLATE P2 COATD GLND 6.5X30 (Shoulder) IMPLANT
BIT DRILL 2.5 DIA 127 CALI (BIT) ×1 IMPLANT
BIT DRILL 4 DIA CALIBRATED (BIT) ×1 IMPLANT
BLADE SAGITTAL WIDE XTHICK NO (BLADE) ×2 IMPLANT
BOWL CEMENT MIXING ADV NOZZLE (MISCELLANEOUS) ×1 IMPLANT
CEMENT GMV SMARTSET GENT 40G (Cement) ×2 IMPLANT
CHLORAPREP W/TINT 26 (MISCELLANEOUS) ×2 IMPLANT
CNTNR SPEC 2.5X3XGRAD LEK (MISCELLANEOUS)
CONT SPEC 4OZ STER OR WHT (MISCELLANEOUS)
CONTAINER SPEC 2.5X3XGRAD LEK (MISCELLANEOUS) ×1 IMPLANT
COOLER POLAR GLACIER W/PUMP (MISCELLANEOUS) ×2 IMPLANT
COVER BACK TABLE REUSABLE LG (DRAPES) ×2 IMPLANT
DERMABOND ADVANCED (GAUZE/BANDAGES/DRESSINGS) ×1
DERMABOND ADVANCED .7 DNX12 (GAUZE/BANDAGES/DRESSINGS) ×1 IMPLANT
DRAPE 3/4 80X56 (DRAPES) ×4 IMPLANT
DRAPE INCISE IOBAN 66X45 STRL (DRAPES) ×3 IMPLANT
DRAPE U-SHAPE 47X51 STRL (DRAPES) ×3 IMPLANT
DRSG OPSITE POSTOP 3X4 (GAUZE/BANDAGES/DRESSINGS) ×1 IMPLANT
DRSG OPSITE POSTOP 4X10 (GAUZE/BANDAGES/DRESSINGS) ×1 IMPLANT
DRSG OPSITE POSTOP 4X6 (GAUZE/BANDAGES/DRESSINGS) ×1 IMPLANT
DRSG OPSITE POSTOP 4X8 (GAUZE/BANDAGES/DRESSINGS) ×1 IMPLANT
DRSG TEGADERM 4X10 (GAUZE/BANDAGES/DRESSINGS) ×3 IMPLANT
DRSG TEGADERM 4X4.75 (GAUZE/BANDAGES/DRESSINGS) ×1 IMPLANT
ELECT REM PT RETURN 9FT ADLT (ELECTROSURGICAL) ×2
ELECTRODE REM PT RTRN 9FT ADLT (ELECTROSURGICAL) ×1 IMPLANT
FIBER TAPE 2MM (SUTURE) ×1 IMPLANT
GAUZE XEROFORM 1X8 LF (GAUZE/BANDAGES/DRESSINGS) ×2 IMPLANT
GLOVE SRG 8 PF TXTR STRL LF DI (GLOVE) ×2 IMPLANT
GLOVE SURG GAMMEX PI TX LF 7.5 (GLOVE) ×4 IMPLANT
GLOVE SURG ORTHO LTX SZ8 (GLOVE) ×4 IMPLANT
GLOVE SURG SYN 8.0 (GLOVE) ×2 IMPLANT
GLOVE SURG SYN 8.0 PF PI (GLOVE) ×1 IMPLANT
GLOVE SURG UNDER POLY LF SZ8 (GLOVE) ×2
GOWN STRL REUS W/ TWL LRG LVL3 (GOWN DISPOSABLE) ×2 IMPLANT
GOWN STRL REUS W/ TWL XL LVL3 (GOWN DISPOSABLE) ×1 IMPLANT
GOWN STRL REUS W/TWL LRG LVL3 (GOWN DISPOSABLE) ×2
GOWN STRL REUS W/TWL XL LVL3 (GOWN DISPOSABLE) ×1
HANDLE YANKAUER SUCT BULB TIP (MISCELLANEOUS) ×1 IMPLANT
HEMOVAC 400CC 10FR (MISCELLANEOUS) ×2 IMPLANT
HOOD PEEL AWAY FLYTE STAYCOOL (MISCELLANEOUS) ×5 IMPLANT
INSERT SMALL SOCKET 32MM NEU (Insert) ×1 IMPLANT
IV NS IRRIG 3000ML ARTHROMATIC (IV SOLUTION) ×1 IMPLANT
KIT STABILIZATION SHOULDER (MISCELLANEOUS) ×2 IMPLANT
MANIFOLD NEPTUNE II (INSTRUMENTS) ×2 IMPLANT
MASK FACE SPIDER DISP (MASK) ×2 IMPLANT
MAT ABSORB  FLUID 56X50 GRAY (MISCELLANEOUS) ×2
MAT ABSORB FLUID 56X50 GRAY (MISCELLANEOUS) ×2 IMPLANT
NDL MAYO 6 CRC TAPER PT (NEEDLE) IMPLANT
NDL REVERSE CUT 1/2 CRC (NEEDLE) ×1 IMPLANT
NDL SPNL 20GX3.5 QUINCKE YW (NEEDLE) ×1 IMPLANT
NEEDLE MAYO 6 CRC TAPER PT (NEEDLE) ×2 IMPLANT
NEEDLE REVERSE CUT 1/2 CRC (NEEDLE) ×2 IMPLANT
NEEDLE SPNL 20GX3.5 QUINCKE YW (NEEDLE) IMPLANT
NOZZLE FLEX THIN (MISCELLANEOUS) ×1 IMPLANT
NS IRRIG 1000ML POUR BTL (IV SOLUTION) ×2 IMPLANT
P2 COATDE GLNOID BSEPLT 6.5X30 (Shoulder) ×2 IMPLANT
PACK ARTHROSCOPY SHOULDER (MISCELLANEOUS) ×2 IMPLANT
PAD WRAPON POLAR SHDR XLG (MISCELLANEOUS) ×1 IMPLANT
PLUG CEMENT CLEARCUT 10MM (Cement) ×1 IMPLANT
PULSAVAC PLUS IRRIG FAN TIP (DISPOSABLE) ×2
RETRIEVER SUT HEWSON (MISCELLANEOUS) ×1 IMPLANT
SCREW BONE LOCKING RSP 5.0X30 (Screw) ×2 IMPLANT
SCREW BONE LOCKING RSP 5.0X34 (Screw) ×2 IMPLANT
SCREW BONE RSP LOCK 5X18 (Screw) IMPLANT
SCREW BONE RSP LOCK 5X30 (Screw) IMPLANT
SCREW BONE RSP LOCK 5X34 (Screw) IMPLANT
SCREW BONE RSP LOCKING 18MM LG (Screw) IMPLANT
SCREW RETAIN W/HEAD 4MM OFFSET (Shoulder) ×1 IMPLANT
SLING ULTRA II LG (MISCELLANEOUS) ×1 IMPLANT
SLING ULTRA II M (MISCELLANEOUS) ×1 IMPLANT
SPONGE T-LAP 18X18 ~~LOC~~+RFID (SPONGE) ×9 IMPLANT
STEM REV PRIMARY 8X108 (Shoulder) ×1 IMPLANT
STRAP SAFETY 5IN WIDE (MISCELLANEOUS) ×3 IMPLANT
SUT ETHIBOND #5 BRAIDED 30INL (SUTURE) ×1 IMPLANT
SUT FIBERWIRE #2 38 BLUE 1/2 (SUTURE) ×4
SUT MNCRL AB 4-0 PS2 18 (SUTURE) ×1 IMPLANT
SUT PROLENE 6 0 P 1 18 (SUTURE) ×1 IMPLANT
SUT TICRON 2-0 30IN 311381 (SUTURE) ×5 IMPLANT
SUT VIC AB 0 CT1 36 (SUTURE) ×3 IMPLANT
SUT VIC AB 2-0 CT2 27 (SUTURE) ×5 IMPLANT
SUT XBRAID 1.4 WHITE/BLUE (SUTURE) ×3 IMPLANT
SUTURE FIBERWR #2 38 BLUE 1/2 (SUTURE) ×4 IMPLANT
SYR 20ML LL LF (SYRINGE) ×2 IMPLANT
SYR 30ML LL (SYRINGE) ×2 IMPLANT
TIP FAN IRRIG PULSAVAC PLUS (DISPOSABLE) ×1 IMPLANT
TRAY FOLEY SLVR 16FR LF STAT (SET/KITS/TRAYS/PACK) ×1 IMPLANT
WATER STERILE IRR 1000ML POUR (IV SOLUTION) ×1 IMPLANT
WATER STERILE IRR 500ML POUR (IV SOLUTION) ×1 IMPLANT
WRAPON POLAR PAD SHDR XLG (MISCELLANEOUS) ×2

## 2022-01-04 NOTE — Anesthesia Preprocedure Evaluation (Signed)
Anesthesia Evaluation  Patient identified by MRN, date of birth, ID band Patient awake    Reviewed: Allergy & Precautions, H&P , NPO status , Patient's Chart, lab work & pertinent test results, reviewed documented beta blocker date and time   History of Anesthesia Complications Negative for: history of anesthetic complications  Airway Mallampati: II   Neck ROM: full    Dental  (+) Poor Dentition, Teeth Intact, Dental Advidsory Given   Pulmonary neg shortness of breath, asthma , neg COPD, neg recent URI,  S/p lung resection for small tumor.   Pulmonary exam normal        Cardiovascular Exercise Tolerance: Good hypertension, (-) angina(-) Past MI and (-) Cardiac Stents Normal cardiovascular exam+ dysrhythmias Atrial Fibrillation (-) Valvular Problems/Murmurs Rhythm:regular Rate:Normal     Neuro/Psych neg Seizures PSYCHIATRIC DISORDERS Anxiety Depression CVA, No Residual Symptoms    GI/Hepatic Neg liver ROS, GERD  ,  Endo/Other  negative endocrine ROS  Renal/GU CRFRenal disease  negative genitourinary   Musculoskeletal   Abdominal   Peds  Hematology negative hematology ROS (+)   Anesthesia Other Findings Past Medical History: No date: A-fib [facility])     Comment:  Only once No date: Anxiety No date: Arthritis     Comment:  oesteoarthritis 04/16/2014: BP (high blood pressure) No date: Chronic kidney disease     Comment:  history nephrolithiasis No date: Depression No date: Dysrhythmia     Comment:  PSVT No date: Endometriosis No date: Herpes zoster left: Lung cancer (HCC) No date: Lymphoma ([place])     Comment:  "stomach" No date: Stroke [facility]) Past Surgical History: No date: AUGMENTATION MAMMAPLASTY; Bilateral No date: CHOLECYSTECTOMY No date: COLONOSCOPY No date: DILATION AND CURETTAGE OF UTERUS No date: HEMORRHOIDECTOMY WITH HEMORRHOID BANDING No date: HERNIA REPAIR No date: LUNG REMOVAL, PARTIAL; Left    Reproductive/Obstetrics negative OB ROS                             Anesthesia Physical  Anesthesia Plan  ASA: 3  Anesthesia Plan: General   Post-op Pain Management: Regional block   Induction: Intravenous  PONV Risk Score and Plan: 3 and Ondansetron, Dexamethasone and Treatment may vary due to age or medical condition  Airway Management Planned: Oral ETT  Additional Equipment:   Intra-op Plan:   Post-operative Plan: Extubation in OR  Informed Consent: I have reviewed the patients History and Physical, chart, labs and discussed the procedure including the risks, benefits and alternatives for the proposed anesthesia with the patient or authorized representative who has indicated his/her understanding and acceptance.     Dental Advisory Given  Plan Discussed with: CRNA  Anesthesia Plan Comments:         Anesthesia Quick Evaluation

## 2022-01-04 NOTE — Anesthesia Postprocedure Evaluation (Signed)
Anesthesia Post Note  Patient: Julie Jennings  Procedure(s) Performed: Left reverse shoulder arthroplasty, biceps tenodesis (Left: Shoulder)  Patient location during evaluation: PACU Anesthesia Type: General Level of consciousness: awake and alert Pain management: pain level controlled Vital Signs Assessment: post-procedure vital signs reviewed and stable Respiratory status: spontaneous breathing, nonlabored ventilation, respiratory function stable and patient connected to nasal cannula oxygen Cardiovascular status: blood pressure returned to baseline and stable Postop Assessment: no apparent nausea or vomiting Anesthetic complications: no   No notable events documented.   Last Vitals:  Vitals:   01/04/22 1754 01/04/22 1815  BP: 125/74 125/80  Pulse: 80 75  Resp: 20 18  Temp:  36.6 C  SpO2: 100% 95%    Last Pain:  Vitals:   01/04/22 1815  TempSrc: Oral  PainSc: 0-No pain                 Precious Haws T[place] Sunde

## 2022-01-04 NOTE — Op Note (Signed)
Operative Note    SURGERY DATE: 01/04/2022   PRE-OP DIAGNOSIS:  1. Right 3-part proximal humerus fracture   POST-OP DIAGNOSIS:  1. Right 3-part proximal humerus fracture   PROCEDURES:  1. Right reverse total shoulder arthroplasty 2. Right biceps tenodesis   SURGEON: Cato Mulligan, MD  ASSISTANTS: Reche Dixon, PA   ANESTHESIA: Gen + Exparel interscalene block   ESTIMATED BLOOD LOSS: 300cc   TOTAL IV FLUIDS: see anesthesia record  IMPLANTS: [employer]: RSP 32-35m Glenoid Head w/Retaining screw; Monoblock Reverse Shoulder Baseplate with 67.6LYcentral screw; 2 locking screws into baseplate ([id number]; Small Shell Humeral Stem 8 x1051m Neutral Small Socket Insert; Bio-absorbable cement restrictor (1083m   INDICATION(S):  Julie Jennings 70 34o. female who sustained a displaced 3-part proximal humerus fracture. After discussion of risks, benefits, and alternatives to surgery, the patient elected to proceed with reverse shoulder arthroplasty and biceps tenodesis.   OPERATIVE FINDINGS: 3-part proximal humerus fracture, severe biceps tendinopathy   OPERATIVE REPORT:   I identified Julie Jennings in the pre-operative holding area. Informed consent was obtained and the surgical site was marked. I reviewed the risks and benefits of the proposed surgical intervention and the patient (and/or patient's guardian) wished to proceed. An interscalene block with Exparel was administered by the Anesthesia team. The patient was transferred to the operative suite and general anesthesia was administered. The patient was placed in the beach chair position with the head of the bed elevated approximately 45 degrees. All down side pressure points were appropriately padded. Pre-op exam under anesthesia confirmed some stiffness and crepitus. Appropriate IV antibiotics were administered. Tranexamic acid was also administered after verifying that the patient had no contraindications. The extremity was then prepped  and draped in standard fashion. A time out was performed confirming the correct extremity, correct patient, and correct procedure.   We used the standard deltopectoral incision from the coracoid to ~12cm distal. We found the cephalic vein and took it laterally. We opened the deltopectoral interval widely and placed retractors under the CA ligament in the subacromial space and under the deltoid tendon at its insertion. We then abducted and internally rotated the arm and released the underlying bursa between these retractors, taking care not to damage the circumflex branch of the axillary nerve.   Next, we brought the arm back in adduction at slight forward flexion with external rotation. We opened the clavipectoral fascia lateral to the conjoint tendon.  The arm was then internally rotated, we cut the falciform ligament at approximately 1 cm of the upper portion of the pectoralis major insertion. Next we unroofed the bicipital groove. Biceps tendon was significantly erythematous and inflamed. We proceeded with a soft tissue biceps tenodesis given the pathology of the tendon.  After opening the biceps tendon sheath all the way to the supraglenoid tubercle, we performed a biceps tenodesis with two #2 TiCron sutures to the upper border of the pectoralis major. The proximal portion of the tendon was excised.   The lesser tuberosity was still attached to the humeral head.  A large osteotome was used to osteotomize the lesser tuberosity.  The greater tuberosity fragment was then identified. The humeral head fragment was then able to be identified posteriorly.  It was removed and cancellous bone was removed and the head to be used later as bone graft.  Four #5 Ethibond sutures were placed around the greater tuberosity fragment. An additional FiberTape and Xbraid Tape was placed around the greater tuberosity to serve as cerclage and  tuberosity to implant fin sutures.    A posterior retractor was used to retract the  humeral shaft, exposing the glenoid.  The anterior capsule was dissected free from the subscapularis and it was excised.  We then grasped the labrum and remnant of the biceps tendon and removed it circumferentially. During the glenoid exposure, the axillary nerve was protected the entire time.  We circumferentially released the capsule off of the glenoid and placed appropriate retractors to gain adequate visualization.  The central guidepin was drilled with a 10 degree inferior tilt. A tap was placed, matching the trajectory of the guidepin. An appropriately sized reamer was used to ream the glenoid. The monoblock baseplate was inserted and excellent fixation was achieved such that the entire scapula rotated with further attempted seating of the baseplate. The peripheral screws were drilled, measured, and placed. A 32-4 glenosphere with screw was placed.    We then turned our attention to the humerus. We sequentially used larger diameter canal finders until we met some resistance. We broached to the size indicated above at 20 degrees of retroversion. We placed a neutral poly trial.  The joint was reduced and noted to have satisfactory stability, motion, and deltoid tension with adequate tuberosity reduction.  We drilled two holes into the shaft on either side of the bicipital groove ~2cm inferior to the humeral fracture line. Xbraid tape was passed through the posterior hole and placed at the superior aspect of the greater tuberosity, and another was passed through the anterior hole and placed at the superior aspect of the lesser tuberosity to serve as vertical fixation sutures. An appropriately sized cement restrictor was placed at the appropriate depth.    Next, the Xbraid from the greater tuberosity fragment was passed laterally through the lateral fin of the implant and then out through the previously drilled hole into the humeral shaft lateral to the bicipital groove. The cerclage FiberTape was passed  medially through the humeral stem. The wound and humeral canal were thoroughly irrigated with pulse lavage. The canal was dried. Pressurized cement was inserted into the humeral canal. The proximal 2cm of cement were removed. Morselized bone graft from the humeral head was then placed where the cement was removed and packed into place. The stem was then inserted into the appropriate position.  Excess cement was removed. The stem was held in place until the cement hardened.  Best fit and tension was achieved with a neutral insert. The neutral poly was placed.  The joint was then reduced.  Horizontal #5 Ethibond sutures were passed through the subscapularis at the bone-tendon junction. The cerclage FiberTape were also passed just medially to the #5 Ethibond sutures. Next the tuberosity to fin suture was tied, followed by horizontal and cerclage sutures. Lastly, the vertical sutures were tied. An additional suture to close the lateral aspect of the rotator interval was placed.    Final confirmation of motion, tension, and stability were satisfactory. The shoulder and tuberosity fragments moved as one unit. The wound was thoroughly irrigated. Vancomycin powder was placed.  A Hemovac drain was placed.    We closed the deltopectoral interval with a running, 0-Vicryl suture.  The cephalic vein was to the posterior proximally and superficial to the distal.  The skin was closed with 2-0 Vicryl and then 4-0 Monocryl with Dermabond. Honeycomb dressing was applied. A PolarCare unit and sling were placed. Patient was extubated, transferred to a stretcher bed and to the post anesthesia care unit in stable condition.  Additionally, this case had significantly increased complexity and surgical time compared to standard reverse shoulder arthroplasty.  This was due to this being a 3 part proximal humerus fracture.  This caused significantly distorted anatomy which required more careful and meticulous dissection, adding to  surgical time.  Additionally, lesser and greater tuberosity repair required multiple sutures to be placed and tied.  Furthermore the humeral component did not achieve press-fit fixation due to the fracture pattern requiring the use of a cemented technique.  All of these factors added approximately 60 minutes to the surgical time compared to that for a standard reverse shoulder arthroplasty.   POSTOPERATIVE PLAN: Operative arm to remain in sling and abduction pillow with arm at neutral at all times except RoM exercises and hygiene. Can perform pendulums, elbow/wrist/hand RoM exercises. Passive RoM allowed to 90 FF and 30 ER. ASA 332m/day x 6 weeks for DVT ppx. Patient to return to clinic in ~2 weeks for post-operative appointment.

## 2022-01-04 NOTE — Anesthesia Procedure Notes (Addendum)
Procedure Name: Intubation Date/Time: 01/04/2022 12:46 PM Performed by: Kelton Pillar, CRNA Pre-anesthesia Checklist: Patient identified, Emergency Drugs available, Suction available and Patient being monitored Patient Re-evaluated:Patient Re-evaluated prior to induction Oxygen Delivery Method: Circle system utilized Preoxygenation: Pre-oxygenation with 100% oxygen Induction Type: IV induction and Cricoid Pressure applied Ventilation: Mask ventilation without difficulty Laryngoscope Size: McGraph and 3 Grade View: Grade I Tube type: Oral Tube size: 6.5 mm Number of attempts: 1 Airway Equipment and Method: Stylet and Oral airway Placement Confirmation: ETT inserted through vocal cords under direct vision, positive ETCO2, breath sounds checked- equal and bilateral and CO2 detector Secured at: 21 cm Tube secured with: Tape Dental Injury: Teeth and Oropharynx as per pre-operative assessment  Difficulty Due To: Difficulty was unanticipated and Difficult Airway- due to anterior larynx Future Recommendations: Recommend- induction with short-acting agent, and alternative techniques readily available

## 2022-01-04 NOTE — Transfer of Care (Signed)
Immediate Anesthesia Transfer of Care Note  Patient: Julie Jennings  Procedure(s) Performed: Left reverse shoulder arthroplasty, biceps tenodesis (Left: Shoulder)  Patient Location: PACU  Anesthesia Type:General  Level of Consciousness: drowsy  Airway & Oxygen Therapy: Patient Spontanous Breathing and Patient connected to face mask oxygen  Post-op Assessment: Report given to RN  Post vital signs: stable  Last Vitals:  Vitals Value Taken Time  BP 122/70 01/04/22 1706  Temp 37 C 01/04/22 1706  Pulse 76 01/04/22 1708  Resp 17 01/04/22 1708  SpO2 100 % 01/04/22 1708  Vitals shown include unvalidated device data.  Last Pain:  Vitals:   01/04/22 1706  TempSrc:   PainSc: Asleep         Complications: No notable events documented.

## 2022-01-04 NOTE — H&P (Signed)
Paper H&P to be scanned into permanent record. H&P reviewed. No significant changes noted.  

## 2022-01-05 ENCOUNTER — Encounter: Payer: Self-pay | Admitting: Orthopedic Surgery

## 2022-01-05 LAB — CBC
HCT: 33.4 % — ABNORMAL LOW (ref 36.0–46.0)
Hemoglobin: 11.1 g/dL — ABNORMAL LOW (ref 12.0–15.0)
MCH: 32.2 pg (ref 26.0–34.0)
MCHC: 33.2 g/dL (ref 30.0–36.0)
MCV: 96.8 fL (ref 80.0–100.0)
Platelets: 202 10*3/uL (ref 150–400)
RBC: 3.45 MIL/uL — ABNORMAL LOW (ref 3.87–5.11)
RDW: 13.3 % (ref 11.5–15.5)
WBC: 13.1 10*3/uL — ABNORMAL HIGH (ref 4.0–10.5)
nRBC: 0 % (ref 0.0–0.2)

## 2022-01-05 LAB — BASIC METABOLIC PANEL
Anion gap: 6 (ref 5–15)
BUN: 10 mg/dL (ref 8–23)
CO2: 28 mmol/L (ref 22–32)
Calcium: 8.4 mg/dL — ABNORMAL LOW (ref 8.9–10.3)
Chloride: 101 mmol/L (ref 98–111)
Creatinine, Ser: 0.68 mg/dL (ref 0.44–1.00)
GFR, Estimated: >60 mL/min (ref >60)
Glucose, Bld: 123 mg/dL — ABNORMAL HIGH (ref 70–99)
Potassium: 4 mmol/L (ref 3.5–5.1)
Sodium: 135 mmol/L (ref 135–145)

## 2022-01-05 MED ORDER — OXYCODONE HCL 5 MG PO TABS: 5.0000 mg | ORAL_TABLET | ORAL | 0 refills | Status: DC | PRN

## 2022-01-05 MED ORDER — ASPIRIN 325 MG PO TBEC: 325.0000 mg | DELAYED_RELEASE_TABLET | Freq: Every day | ORAL | 0 refills | Status: AC

## 2022-01-05 MED ORDER — ONDANSETRON HCL 4 MG PO TABS: 4.0000 mg | ORAL_TABLET | Freq: Four times a day (QID) | ORAL | 0 refills | Status: DC | PRN

## 2022-01-05 NOTE — Plan of Care (Signed)
Patient up in chair. Aox4. No request for pain medication at this time. Surgical dressing C/D/I. Foley removed. Hemovac drain removed by Sherren Mocha, PA-C. Plan of care reviewed with patient. Call bell within reach.  PLAN OF CARE ONGOING Problem: Education: Goal: Knowledge of General Education information will improve Description: Including pain rating scale, medication(s)/side effects and non-pharmacologic comfort measures Outcome: Progressing   Problem: Health Behavior/Discharge Planning: Goal: Ability to manage health-related needs will improve Outcome: Progressing   Problem: Clinical Measurements: Goal: Ability to maintain clinical measurements within normal limits will improve Outcome: Progressing Goal: Will remain free from infection Outcome: Progressing Goal: Diagnostic test results will improve Outcome: Progressing Goal: Respiratory complications will improve Outcome: Progressing Goal: Cardiovascular complication will be avoided Outcome: Progressing   Problem: Activity: Goal: Risk for activity intolerance will decrease Outcome: Progressing   Problem: Nutrition: Goal: Adequate nutrition will be maintained Outcome: Progressing   Problem: Coping: Goal: Level of anxiety will decrease Outcome: Progressing   Problem: Elimination: Goal: Will not experience complications related to bowel motility Outcome: Progressing Goal: Will not experience complications related to urinary retention Outcome: Progressing   Problem: Pain Managment: Goal: General experience of comfort will improve Outcome: Progressing   Problem: Safety: Goal: Ability to remain free from injury will improve Outcome: Progressing   Problem: Skin Integrity: Goal: Risk for impaired skin integrity will decrease Outcome: Progressing   Problem: Education: Goal: Knowledge of the prescribed therapeutic regimen will improve Outcome: Progressing Goal: Understanding of activity limitations/precautions following  surgery will improve Outcome: Progressing Goal: Individualized Educational Video(s) Outcome: Progressing   Problem: Activity: Goal: Ability to tolerate increased activity will improve Outcome: Progressing   Problem: Pain Management: Goal: Pain level will decrease with appropriate interventions Outcome: Progressing

## 2022-01-05 NOTE — Evaluation (Signed)
Occupational Therapy Evaluation Patient Details Name: Julie Jennings MRN: [id number] DOB: [birth year] Today's Date: 01/05/2022   History of Present Illness Pt is a 71 y.o. female s/p L reverse total shoulder arthroplasty and L biceps tenodesis 01/04/22 secondary to L 3-part proximal humerus fx.  PMH includes asthma, htn, a-fib, anxiety, CVA, CKD, lung CA, L partial lung removal, lymphoma, and hernia repair.   Clinical Impression   Ms Eberle was seen for OT evaluation this date. Prior to hospital admission, pt was Independent with mobility and ADLs including driving, since recent fx pt reports not removing sling at all. Pt lives alone with cousin/church friends available as needed. Pt presents to acute OT demonstrating impaired ADL performance and functional mobility 2/2 decreased activity tolerance, impaired non-dominant LUE use, and functional strength/ROM/balance deficits. Pt currently requires MAX A don/doff sling and polar care seated EOC. Cousin at bedside expressing concerns with sling mgmt - extensive caregiver education re: home/routines modifications, adapted dressing strategies, and sling /polar care mgmt. Caregiver required MOD cues don/doff sling and polar care with pt seated EOC. MOD I toilet t/f and perihygiene. MOD I don/doff underwear in sit<>stand. Pt would benefit from skilled OT to address noted impairments and functional limitations (see below for any additional details). Upon hospital discharge, recommend HHOT to maximize pt safety and return to PLOF.       Recommendations for follow up therapy are one component of a multi-disciplinary discharge planning process, led by the attending physician.  Recommendations may be updated based on patient status, additional functional criteria and insurance authorization.   Follow Up Recommendations  Home health OT    Assistance Recommended at Discharge Intermittent Supervision/Assistance  Patient can return home with the following A lot of help  with bathing/dressing/bathroom;Assistance with cooking/housework    Functional Status Assessment  Patient has had a recent decline in their functional status and demonstrates the ability to make significant improvements in function in a reasonable and predictable amount of time.  Equipment Recommendations  None recommended by OT    Recommendations for Other Services       Precautions / Restrictions Precautions Precautions: Fall;Shoulder Shoulder Interventions: Shoulder sling/immobilizer;Shoulder abduction pillow;Off for dressing/bathing/exercises Precaution Comments: L UE shoulder immobilizer may be off for dressing/bathing/exercise Required Braces or Orthoses: Sling Restrictions Weight Bearing Restrictions: Yes LUE Weight Bearing: Non weight bearing Other Position/Activity Restrictions: Per MD operative report: "Operative arm to remain in sling and abduction pillow with arm at neutral at all times except RoM exercises and hygiene. Can perform pendulums, elbow/wrist/hand RoM exercises. Passive RoM allowed to 90 FF and 30 ER."      Mobility Bed Mobility                    Transfers Overall transfer level: Modified independent Equipment used: None                      Balance Overall balance assessment: Needs assistance Sitting-balance support: No upper extremity supported, Feet supported Sitting balance-Leahy Scale: Normal     Standing balance support: No upper extremity supported, During functional activity Standing balance-Leahy Scale: Good Standing balance comment: good awareness                           ADL either performed or assessed with clinical judgement   ADL Overall ADL's : Needs assistance/impaired  General ADL Comments: MAX A don/doff sling and polar care seated EOC. MOD cues caregiver don/doff sling and polar care seated EOC. MOD I toilet t/f and perihygiene. MOD I don/doff  underwear in sit<>stand.      Pertinent Vitals/Pain Pain Assessment Pain Assessment: No/denies pain     Hand Dominance Right   Extremity/Trunk Assessment Upper Extremity Assessment Upper Extremity Assessment: LUE deficits/detail LUE Deficits / Details: ROM WFL LUE: Unable to fully assess due to immobilization   Lower Extremity Assessment Lower Extremity Assessment: Overall WFL for tasks assessed   Cervical / Trunk Assessment Cervical / Trunk Assessment: Normal   Communication Communication Communication: No difficulties   Cognition Arousal/Alertness: Awake/alert Behavior During Therapy: WFL for tasks assessed/performed Overall Cognitive Status: Within Functional Limits for tasks assessed                                       General Comments  L UE in shoulder sling/immobilizer            Home Living Family/patient expects to be discharged to:: Private residence Living Arrangements: Alone Available Help at Discharge: Friend(s);Available PRN/intermittently Type of Home: House Home Access: Stairs to enter [employer] of Steps: 3 Entrance Stairs-Rails: Left Home Layout: One level     Bathroom Shower/Tub: Teacher, early years/pre: Handicapped height     Home Equipment: Grab bars - tub/shower          Prior Functioning/Environment Prior Level of Function : Independent/Modified Independent             Mobility Comments: Pt has been managing at home since L shoulder injury ADLs Comments: has not taken shoulder sling off while at home        OT Problem List: Decreased strength;Decreased range of motion;Decreased activity tolerance;Impaired balance (sitting and/or standing);Decreased safety awareness;Impaired UE functional use      OT Treatment/Interventions: Self-care/ADL training;Therapeutic exercise;Energy conservation;DME and/or AE instruction;Therapeutic activities;Patient/family education;Balance training     OT Goals(Current goals can be found in the care plan section) Acute Rehab OT Goals Patient Stated Goal: to go home OT Goal Formulation: With patient/family Time For Goal Achievement: 01/19/22 Potential to Achieve Goals: Good ADL Goals Pt Will Perform Upper Body Dressing: with supervision;with caregiver independent in assisting  OT Frequency: Min 2X/week    Co-evaluation              AM-PAC OT "6 Clicks" Daily Activity     Outcome Measure Help from another person eating meals?: None Help from another person taking care of personal grooming?: None Help from another person toileting, which includes using toliet, bedpan, or urinal?: None Help from another person bathing (including washing, rinsing, drying)?: A Lot Help from another person to put on and taking off regular upper body clothing?: A Lot Help from another person to put on and taking off regular lower body clothing?: None 6 Click Score: 20   End of Session Nurse Communication: Mobility status  Activity Tolerance: Patient tolerated treatment well Patient left: in chair;with call bell/phone within reach;with nursing/sitter in room;with family/visitor present  OT Visit Diagnosis: Unsteadiness on feet (R26.81)                Time: 9233-0076 OT Time Calculation (min): 80 min Charges:  OT General Charges $OT Visit: 1 Visit OT Evaluation $OT Eval Low Complexity: 1 Low OT Treatments $Self Care/Home Management : 68-82 mins  Dessie Coma, M.S. OTR/L  01/05/22, 1:08 PM  ascom [phone]

## 2022-01-05 NOTE — Progress Notes (Signed)
DISCHARGE NOTE:  Patient has been read and given her discharge packet. Patient given her prescriptions. Shoulder immobilizer on and intact. Polar Care given to take with patient. Staff wheeled her out. Patients cousin is transporting her home.

## 2022-01-05 NOTE — Discharge Summary (Signed)
Physician Discharge Summary  Subjective: 1 Day Post-Op Procedure(s) (LRB): Left reverse shoulder arthroplasty, biceps tenodesis (Left) Patient reports pain as mild.   Patient seen in rounds with Dr. Posey Pronto. Patient is well, and has had no acute complaints or problems Patient is ready to go home after physical therapy  Physician Discharge Summary  Patient ID: Julie Jennings MRN: [id number] DOB/AGE: [birth year] 71 y.o.  Admit date: 01/04/2022 Discharge date: 01/05/2022  Admission Diagnoses:  Discharge Diagnoses:  Principal Problem:   Proximal humerus fracture   Discharged Condition: fair  Hospital Course: The patient is postop day 1 from a left reverse total shoulder replacement.  She is doing well with her pain management as the nerve block is still working.  She slept well.  She is ready to go home and will begin outpatient physical therapy.  Her vitals have remained stable.  Treatments:  1. Right reverse total shoulder arthroplasty 2. Right biceps tenodesis   SURGEON: Cato Mulligan, MD   ASSISTANTS: Reche Dixon, PA   ANESTHESIA: Gen + Exparel interscalene block   ESTIMATED BLOOD LOSS: 300cc   TOTAL IV FLUIDS: see anesthesia record   IMPLANTS: [employer]: RSP 32-11mm Glenoid Head w/Retaining screw; Monoblock Reverse Shoulder Baseplate with 9.9JT central screw; 2 locking screws into baseplate (70VX, 79TJ); Small Shell Humeral Stem 8 x183mm; Neutral Small Socket Insert; Bio-absorbable cement restrictor (75mm).  Discharge Exam: Blood pressure 109/60, pulse 72, temperature 98.4 F (36.9 C), resp. rate 18, height 5\' 5"  (1.651 m), weight 93.4 kg, SpO2 98 %.   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 What changed: when to take this   aspirin 81 MG chewable tablet Chew by mouth. What changed: Another medication with the same name was added. Make sure you understand how and when to take each.   aspirin 325 MG EC tablet Take 1 tablet (325 mg total) by mouth daily. What changed: You were already taking a medication with the same name, and this prescription was added. Make sure you understand how and when to take each.   erythromycin ophthalmic ointment Place 1 application into the right eye daily as needed (after shot).   furosemide 40 MG tablet Commonly known as: LASIX Take 40 mg by mouth daily.   ibuprofen 200 MG tablet Commonly known as: ADVIL Take 600 mg by mouth at bedtime as needed for mild pain or moderate pain.   ketoconazole 2 % cream Commonly known as: NIZORAL Apply to the feet QHS   losartan 100 MG tablet Commonly known as: COZAAR Take 100 mg by mouth daily.   meloxicam 15 MG tablet Commonly known as: MOBIC Take 15 mg by mouth daily.   nebivolol 10 MG tablet Commonly known as: BYSTOLIC Take 10 mg by mouth daily.   ondansetron 4 MG tablet Commonly known  as: ZOFRAN Take 1 tablet (4 mg total) by mouth every 6 (six) hours as needed for nausea.   oxyCODONE 5 MG immediate release tablet Commonly known as: Oxy IR/ROXICODONE Take 1 tablet (5 mg total) by mouth every 4 (four) hours as needed for moderate pain (pain score 4-6). What changed: reasons to take this   pantoprazole 40 MG tablet Commonly known as: PROTONIX Take 40 mg by mouth 2 (two) times daily.   Systane 0.4-0.3 % Soln Generic drug: Polyethyl Glycol-Propyl Glycol Place 1 drop into both eyes  daily as needed (Dry eye).   tiotropium 18 MCG inhalation capsule Commonly known as: SPIRIVA Place 18 mcg into inhaler and inhale daily.   triamcinolone 55 MCG/ACT Aero nasal inhaler Commonly known as: NASACORT Place 2 sprays into the nose daily.   venlafaxine XR 75 MG 24 hr capsule Commonly known as: EFFEXOR-XR Take 75 mg by mouth 2 (two) times daily.        Follow-up Information     Leim Fabry, MD. Go in 2 week(s).   Specialty: Orthopedic Surgery Why: Suture removal and x-rays Contact information: [place] [id number] [phone]                 Signed: Prescott Parma, Murrel Bertram 01/05/2022, 6:52 AM   Objective: Vital signs in last 24 hours: Temp:  [97.1 F (36.2 C)-98.6 F (37 C)] 98.4 F (36.9 C) (01/27 0332) Pulse Rate:  [65-83] 72 (01/27 0332) Resp:  [14-21] 18 (01/27 0332) BP: (109-154)/(60-97) 109/60 (01/27 0332) SpO2:  [95 %-100 %] 98 % (01/27 0332) Weight:  [93.4 kg] 93.4 kg (01/26 1058)  Intake/Output from previous day:  Intake/Output Summary (Last 24 hours) at 01/05/2022 0652 Last data filed at 01/05/2022 0503 Gross per 24 hour  Intake 1937.75 ml  Output 2560 ml  Net -622.25 ml    Intake/Output this shift: Total I/O In: 837.8 [I.V.:637.1; IV Piggyback:200.6] Out: 1610 [Urine:1600; Drains:10]  Labs: Recent Labs    01/03/22 1109  HGB 11.7*   Recent Labs    01/03/22 1109  WBC 7.1  RBC 3.66*  HCT 34.8*  PLT 199   Recent Labs    01/03/22 1109  NA 137  K 3.7  CL 103  CO2 25  BUN 15  CREATININE 0.50  GLUCOSE 147*  CALCIUM 9.0   No results for input(s): LABPT, INR in the last 72 hours.  EXAM: General - Patient is Alert and Oriented Extremity - Neurovascular intact Intact pulses distally Dorsiflexion/Plantar flexion intact Incision - clean, dry, with the Hemovac removed Motor Function -dorsiflexion and volar flexion of the wrist and fingers intact.  Assessment/Plan: 1 Day Post-Op Procedure(s)  (LRB): Left reverse shoulder arthroplasty, biceps tenodesis (Left) Procedure(s) (LRB): Left reverse shoulder arthroplasty, biceps tenodesis (Left) Past Medical History:  Diagnosis Date   A-fib ([place])    Only once   Anxiety    Arthritis    oesteoarthritis   BP (high blood pressure) 04/16/2014   Chronic kidney disease    history nephrolithiasis   Depression    Dysrhythmia    PSVT   Endometriosis    Herpes zoster    History of kidney stones    Lung cancer ([place]) left   Lymphoma ([place])    "stomach"   Stroke [facility])    Principal Problem:   Proximal humerus fracture  Estimated body mass index is 34.28 kg/m as calculated from the following:   Height as of this encounter: 5\' 5"  (1.651 m).   Weight  as of this encounter: 93.4 kg. Advance diet Up with therapy D/C IV fluids Diet - Regular diet Follow up - in 2 weeks Activity -shoulder immobilizer on left arm at all times, except bathing and physical therapy. Disposition - Home Condition Upon Discharge - Stable DVT Prophylaxis - Aspirin  Reche Dixon, PA-C Orthopaedic Surgery 01/05/2022, 6:52 AM

## 2022-01-05 NOTE — Evaluation (Addendum)
Physical Therapy Evaluation Patient Details Name: Julie Jennings MRN: [id number] DOB: 07-17-1951 Today's Date: 01/05/2022  History of Present Illness  Pt is a 71 y.o. female s/p L reverse total shoulder arthroplasty and L biceps tenodesis 01/04/22 secondary to L 3-part proximal humerus fx.  PMH includes asthma, htn, a-fib, anxiety, CVA, CKD, lung CA, L partial lung removal, lymphoma, and hernia repair.  Clinical Impression  Prior to surgery, pt most recently was modified independent with functional mobility at home since recent shoulder injury; lives alone in 1 level home with 3 STE L railing; family/friends able to assist PRN.  Currently pt is modified independent with transfers; SBA with ambulation 300 feet no AD; and SBA navigating 4 steps with L railing.  Pt steady and safe with functional mobility and dynamic balance activities during sessions activities.  0/10 L shoulder pain during session.  Educated pt and pt's cousin on home modifications/home safety:  both appearing with good understanding.  Pt would benefit from skilled PT to address noted impairments and functional limitations during hospitalization (see below for any additional details).  Upon hospital discharge, pt would benefit from HHPT for L shoulder therapy.    Recommendations for follow up therapy are one component of a multi-disciplinary discharge planning process, led by the attending physician.  Recommendations may be updated based on patient status, additional functional criteria and insurance authorization.  Follow Up Recommendations Home health PT    Assistance Recommended at Discharge Set up Supervision/Assistance  Patient can return home with the following  Assistance with cooking/housework;Assist for transportation;A little help with bathing/dressing/bathroom;Help with stairs or ramp for entrance    Equipment Recommendations None recommended by PT  Recommendations for Other Services  OT consult    Functional Status  Assessment Patient has had a recent decline in their functional status and demonstrates the ability to make significant improvements in function in a reasonable and predictable amount of time.     Precautions / Restrictions Precautions Precautions: Fall;Shoulder Shoulder Interventions: Shoulder sling/immobilizer;Shoulder abduction pillow;Off for dressing/bathing/exercises Precaution Comments: L UE shoulder immobilizer may be off for dressing/bathing/exercise Restrictions Weight Bearing Restrictions: Yes LUE Weight Bearing: Non weight bearing Other Position/Activity Restrictions: Per MD operative report: "Operative arm to remain in sling and abduction pillow with arm at neutral at all times except RoM exercises and hygiene. Can perform pendulums, elbow/wrist/hand RoM exercises. Passive RoM allowed to 90 FF and 30 ER."      Mobility  Bed Mobility               General bed mobility comments: Pt declined (pt reports plan to sleep in recliner at home as she has been)    Transfers Overall transfer level: Modified independent Equipment used: None               General transfer comment: steady safe transfers noted    Ambulation/Gait Ambulation/Gait assistance: Supervision Gait Distance (Feet): 300 Feet Assistive device: None Gait Pattern/deviations: Step-through pattern Gait velocity: normal     General Gait Details: steady ambulation  Stairs Stairs: Yes Stairs assistance: Supervision Stair Management: One rail Left, Sideways Number of Stairs: 4 General stair comments: steady safe stairs navigation  Wheelchair Mobility    Modified Rankin (Stroke Patients Only)       Balance Overall balance assessment: Needs assistance Sitting-balance support: No upper extremity supported, Feet supported Sitting balance-Leahy Scale: Normal Sitting balance - Comments: steady sitting reaching outside BOS with non-operative UE   Standing balance support: No upper extremity  supported, During functional  activity Standing balance-Leahy Scale: Good Standing balance comment: no loss of balance with ambulation and head turns R/L/up/down, increasing/decreasing speed, and turning 360 degrees and stopping (therapist cued pt for 180 degree turn but pt performed 360 degrees instead)                             Pertinent Vitals/Pain Pain Assessment Pain Assessment: No/denies pain Vitals (HR and O2 on room air) stable and WFL throughout treatment session.    Home Living Family/patient expects to be discharged to:: Private residence Living Arrangements: Alone Available Help at Discharge: Friend(s);Available PRN/intermittently Type of Home: House Home Access: Stairs to enter Entrance Stairs-Rails: Left Entrance Stairs-Number of Steps: 3   Home Layout: One level Home Equipment: Grab bars - tub/shower (may have a SPC)      Prior Function Prior Level of Function : Independent/Modified Independent             Mobility Comments: Pt has been managing at home since L shoulder injury       Hand Dominance        Extremity/Trunk Assessment   Upper Extremity Assessment Upper Extremity Assessment: Defer to OT evaluation;LUE deficits/detail LUE: Unable to fully assess due to immobilization    Lower Extremity Assessment Lower Extremity Assessment:  (4+/5 B hip flexion, knee flexion/extension, and DF/PF)    Cervical / Trunk Assessment Cervical / Trunk Assessment: Normal  Communication   Communication: No difficulties  Cognition Arousal/Alertness: Awake/alert Behavior During Therapy: WFL for tasks assessed/performed Overall Cognitive Status: Within Functional Limits for tasks assessed                                          General Comments General comments (skin integrity, edema, etc.): L UE in shoulder sling/immobilizer.  Nursing cleared pt for participation in physical therapy.  Pt agreeable to PT session.    Exercises      Assessment/Plan    PT Assessment Patient needs continued PT services  PT Problem List Decreased strength;Decreased range of motion;Decreased knowledge of use of DME;Decreased knowledge of precautions;Pain       PT Treatment Interventions DME instruction;Gait training;Stair training;Functional mobility training;Therapeutic activities;Therapeutic exercise;Balance training;Patient/family education    PT Goals (Current goals can be found in the Care Plan section)  Acute Rehab PT Goals Patient Stated Goal: to go home PT Goal Formulation: With patient Time For Goal Achievement: 01/19/22 Potential to Achieve Goals: Good    Frequency BID     Co-evaluation               AM-PAC PT "6 Clicks" Mobility  Outcome Measure Help needed turning from your back to your side while in a flat bed without using bedrails?: None Help needed moving from lying on your back to sitting on the side of a flat bed without using bedrails?: None Help needed moving to and from a bed to a chair (including a wheelchair)?: A Little Help needed standing up from a chair using your arms (e.g., wheelchair or bedside chair)?: A Little Help needed to walk in hospital room?: A Little Help needed climbing 3-5 steps with a railing? : A Little 6 Click Score: 20    End of Session Equipment Utilized During Treatment: Gait belt;Other (comment) (L UE sling/immobilizer) Activity Tolerance: Patient tolerated treatment well Patient left: in chair;with call bell/phone within reach;with  family/visitor present Nurse Communication: Mobility status;Precautions;Weight bearing status;Other (comment) (Pt ready for discharge from PT perspective) PT Visit Diagnosis: Muscle weakness (generalized) (M62.81);Pain Pain - Right/Left: Left Pain - part of body: Shoulder    Time: 2583-4621 PT Time Calculation (min) (ACUTE ONLY): 52 min   Charges:   PT Evaluation $PT Eval Low Complexity: 1 Low PT Treatments $Therapeutic Activity:  23-37 mins       Leitha Bleak, PT 01/05/22, 10:16 AM

## 2022-01-05 NOTE — TOC Progression Note (Signed)
Transition of Care (TOC) - Progression Note  ° ° °Patient Details  °Name: Julie Jennings °MRN: [id number] °Date of Birth: 05/04/1951 ° °Transition of Care (TOC) CM/SW Contact  ° J Louvet, RN °Phone Number: °01/05/2022, 11:20 AM ° °Clinical Narrative:   Met with the patient and her cousin in the room at the bedside, She does not need DME, [facility] has accepted her for HH, Her cousin will provide transportation, she can afford her meds ° ° ° °  °  ° °Expected Discharge Plan and Services °  °  °  °  °  °Expected Discharge Date: 01/05/22               °  °  °  °  °  °  °  °  °  °  ° ° °Social Determinants of Health (SDOH) Interventions °  ° °Readmission Risk Interventions °No flowsheet data found. ° °

## 2022-01-05 NOTE — Progress Notes (Signed)
°  Subjective: 1 Day Post-Op Procedure(s) (LRB): Left reverse shoulder arthroplasty, biceps tenodesis (Left) Patient reports pain as mild.   Patient is well, and has had no acute complaints or problems Plan is to go Home after hospital stay. Negative for chest pain and shortness of breath Fever: no Gastrointestinal:negative for nausea and vomiting  Objective: Vital signs in last 24 hours: Temp:  [97.1 F (36.2 C)-98.6 F (37 C)] 98.4 F (36.9 C) (01/27 0332) Pulse Rate:  [65-83] 72 (01/27 0332) Resp:  [14-21] 18 (01/27 0332) BP: (109-154)/(60-97) 109/60 (01/27 0332) SpO2:  [95 %-100 %] 98 % (01/27 0332) Weight:  [93.4 kg] 93.4 kg (01/26 1058)  Intake/Output from previous day:  Intake/Output Summary (Last 24 hours) at 01/05/2022 0642 Last data filed at 01/05/2022 0503 Gross per 24 hour  Intake 1937.75 ml  Output 2560 ml  Net -622.25 ml    Intake/Output this shift: Total I/O In: 837.8 [I.V.:637.1; IV Piggyback:200.6] Out: 1610 [Urine:1600; Drains:10]  Labs: Recent Labs    01/03/22 1109  HGB 11.7*   Recent Labs    01/03/22 1109  WBC 7.1  RBC 3.66*  HCT 34.8*  PLT 199   Recent Labs    01/03/22 1109  NA 137  K 3.7  CL 103  CO2 25  BUN 15  CREATININE 0.50  GLUCOSE 147*  CALCIUM 9.0   No results for input(s): LABPT, INR in the last 72 hours.   EXAM General - Patient is Alert and Oriented Extremity - Neurovascular intact Intact pulses distally Dorsiflexion/Plantar flexion intact Anesthesia Block still working Dressing/Incision - clean, dry, with the hemovac removed Motor Function - intact, moving fingers and wrist well on exam.   Past Medical History:  Diagnosis Date   A-fib ([place])    Only once   Anxiety    Arthritis    oesteoarthritis   BP (high blood pressure) 04/16/2014   Chronic kidney disease    history nephrolithiasis   Depression    Dysrhythmia    PSVT   Endometriosis    Herpes zoster    History of kidney stones    Lung cancer ([place])  left   Lymphoma ([place])    "stomach"   Stroke ([place])     Assessment/Plan: 1 Day Post-Op Procedure(s) (LRB): Left reverse shoulder arthroplasty, biceps tenodesis (Left) Principal Problem:   Proximal humerus fracture  Estimated body mass index is 34.28 kg/m as calculated from the following:   Height as of this encounter: 5\' 5"  (1.651 m).   Weight as of this encounter: 93.4 kg. Advance diet Up with therapy D/C IV fluids  Discharge planning to go home today  DVT Prophylaxis - Aspirin Shoulder immobilizer to Left arm  Reche Dixon, PA-C Orthopaedic Surgery 01/05/2022, 6:42 AM

## 2022-01-05 NOTE — Discharge Instructions (Signed)
Julie Mulligan, MD  [facility]  Phone: [phone]  Fax: [phone]   Discharge Instructions after Reverse Shoulder Replacement    1. Activity/Sling: You are to be non-weight bearing on operative extremity. A sling/shoulder immobilizer has been provided for you. Only remove the sling to perform elbow, wrist, and hand RoM exercises and hygiene/dressing. Active reaching and lifting are not permitted. You will be given further instructions on sling use at your first physical therapy visit and postoperative visit with Dr. Posey Pronto.   2. Dressings: Dressing may be removed at 1st physical therapy visit (~3-4 days after surgery). Afterwards, you may either leave open to air (if no drainage) or cover with dry, sterile dressing. If you have steri-strips on your wound, please do not remove them. They will fall off on their own. You may shower 5 days after surgery. Please pat incision dry. Do not rub or place any shear forces across incision. If there is drainage or any opening of incision after 5 days, please notify our offices immediately.    3. Driving:  Plan on not driving for six weeks. Please note that you are advised NOT to drive while taking narcotic pain medications as you may be impaired and unsafe to drive.   4. Medications:  - You have been provided a prescription for narcotic pain medicine (usually oxycodone). After surgery, take 1-2 narcotic tablets every 4 hours if needed for severe pain. Please start this as soon as you begin to start having pain (if you received a nerve block, start taking as soon as this wears off).  - A prescription for anti-nausea medication will be provided in case the narcotic medicine causes nausea - take 1 tablet every 6 hours only if nauseated.  - Take enteric coated aspirin 325 mg once daily for 6 weeks to prevent blood clots. Do not take aspirin if you have an aspirin sensitivity/allergy or asthma or are on an anticoagulant (blood thinner) already. If so, then  your home anticoagulant will be resume and managed - do not take aspirin. -Take tylenol 1000mg  (2 Extra strength or 3 regular strength tablets) every 8 hours for pain. This will reduce the amount of narcotic medication needed. May stop tylenol when you are having minimal pain. - Take a stool softener (Colace, Dulcolax or Senakot) if you are using narcotic pain medications to help with constipation that is associated with narcotic use. - DO NOT take ANY nonsteroidal anti-inflammatory pain medications: Advil, Motrin, Ibuprofen, Aleve, Naproxen, or Naprosyn.   If you are taking prescription medication for anxiety, depression, insomnia, muscle spasm, chronic pain, or for attention deficit disorder you are advised that you are at a higher risk of adverse effects with use of narcotics post-op, including narcotic addiction/dependence, depressed breathing, death. If you use non-prescribed substances: alcohol, marijuana, cocaine, heroin, methamphetamines, etc., you are at a higher risk of adverse effects with use of narcotics post-op, including narcotic addiction/dependence, depressed breathing, death. You are advised that taking > 50 morphine milligram equivalents (MME) of narcotic pain medication per day results in twice the risk of overdose or death. For your prescription provided: oxycodone 5 mg - taking more than 6 tablets per day after the first few days of surgery.   5. Physical Therapy: 1-2 times per week for ~12 weeks. Therapy typically starts on post operative Day 3 or 4. You have been provided an order for physical therapy. The therapist will provide home exercises. Please contact our offices if this appointment has not been scheduled.  6. Work: May do light duty/desk job in approximately 2 weeks when off of narcotics, pain is well-controlled, and swelling has decreased if able to function with one arm in sling. Full work may take 6 weeks if light motions and function of both arms is required.  Lifting jobs may require 12 weeks.   7. Post-Op Appointments: Your first post-op appointment will be with Dr. Posey Pronto in approximately 2 weeks time.    If you find that they have not been scheduled please call the Orthopaedic Appointment front desk at [phone].                               Julie Mulligan, MD [facility] Phone: [phone] Fax: [phone]   REVERSE SHOULDER ARTHROPLASTY REHAB GUIDELINES   These guidelines should be tailored to individual patients based on their rehab goals, age, precautions, quality of repair, etc.  Progression should be based on patient progress and approval by the referring physician.  PHASE 1 - Day 1 through Week 2  GENERAL GUIDELINES AND PRECAUTIONS Sling wear 24/7 except during grooming and home exercises (3 to 5 times daily) Avoid shoulder extension such that the arm is posterior the frontal plane.  When patients recline, a pillow should be placed behind the upper arm and sling should be on.  They should be advised to always be able to see the elbow Avoid combined IR/ADD/EXT, such as hand behind back to prevent dislocation Avoid combined IR and ADD such as reaching across the chest to prevent dislocation No AROM No submersion in pool/water for 4 weeks No weight bearing through operative arm (as in transfers, walker use, etc)  GOALS Maintain integrity of joint replacement; protect soft tissue healing Increase PROM for elevation to 120 and ER to 30 (will remain the goal for first 6 weeks) Optimize distal UE circulation and muscle activity (elbow, wrist and hand) Instruct in use of sling for proper fit, polar care device for ice application after HEP, signs/symptoms of infection  EXERCISES Active elbow, wrist and hand Passive forward elevation in scapular plane to 90-120 max motion; ER in scapular plane to 30 Active scapular retraction with arms resting in neutral position  CRITERIA TO PROGRESS TO  PHASE 2 Low pain (less than 3/10) with shoulder PROM Healing of incision without signs of infection Clearance by MD to advance after 2 week MD check up  PHASE 2 - 2 weeks - 6 weeks  GENERAL GUIDELINES AND PRECAUTIONS Sling may be removed while at home; worn in community without abduction pillow May use arm for light activities of daily living (such as feeding, brushing teeth, dressing) with elbow near  the side of the body  and arm in front of the body- no active lifting of the arm May submerge in water (tub, pool, [place], etc) after 4 weeks Continue to avoid WBing through the operative arm Continue to avoid combined IR/EXT/ADD (hand behind the back) and IR/ADD  (reaching across chest) for dislocation precautions  GOALS  Achieve passive elevation to 120 and ER to 30  Low (less than 3/10) to no pain  Ability to fire all heads of the deltoid  EXERCISES May discontinue grip, and active elbow and wrist exercises since using the arm in ADLs  with sling removed around the home Continue passive elevation to 120 and ER to 30, both in scapular plane with arm supported on table top Add submaximal isometrics, pain free effort, for all  functional heads of deltoid (anterior, posterior, middle)  Ensure that with posterior deltoid isometric the shoulder does not move into extension and the arm remains anterior the frontal plane At 4 weeks:  begin to place arm in balanced position of 90 deg elevation in supine; when patient able to hold this position with ease, may begin reverse pendulums clockwise and counterclockwise  CRITERIA TO PROGRESS TO PHASE 3 Passive forward elevation in scapular plane to 120; passive ER in scapular plane to 30 Ability to fire isometrically all heads of the deltoid muscle without pain Ability to place and hold the arm in balanced position (90 deg elevation in supine)  PHASE 3 - 6 weeks to 3 months  GENERAL GUIDELINES AND PRECAUTIONS Discontinue use of sling Avoid  forcing end range motion in any direction to prevent dislocation  May advance use of the arm actively in ADLs without being restricted to arm by the side of the body, however, avoid heavy lifting and sports (forever!) May initiate functional IR behind the back gently NO UPPER BODY ERGOMETER   GOALS Optimize PROM for elevation and ER in scapular plane with realistic expectation that max  mobility for elevation is usually around 145-160 passively; ER 40 to 50 passively; functional IR to L1 Recover AROM to approach as close to PROM available as possible; may expect 135-150 deg active elevation; 30 deg active ER; active functional IR to L1 Establish dynamic stability of the shoulder with deltoid and periscapular muscle gradual strengthening  EXERCISES Forward elevation in scapular plane active progression: supine to incline, to vertical; short to long lever arm Balanced position long lever arm AROM Active ER/IR with arm at side Scapular retraction with light band resistance Functional IR with hand slide up back - very gentle and gradual NO UPPER BODY ERGOMETER     CRITERIA TO PROGRESS TO PHASE 4  AROM equals/approaches PROM with good mechanics for elevation   No pain  Higher level demand on shoulder than ADL functions   PHASE 4 12 months and beyond  GENERAL GUIDELINES AND PRECAUTIONS No heavy lifting and no overhead sports No heavy pushing activity Gradually increase strength of deltoid and scapular stabilizers; also the rotator cuff if present with weights not to exceed 5 lbs NO UPPER BODY ERGOMETER   GOALS  Optimize functional use of the operative UE to meet the desired demands  Gradual increase in deltoid, scapular muscle, and rotator cuff strength  Pain free functional activities   EXERCISES Add light hand weights for deltoid up to and not to exceed 3 lbs for anterior and posterior with long arm lift against gravity; elbow bent to 90 deg for abduction in scapular  plane Theraband progression for extension to hip with scapular depression/retraction Theraband progression for serratus anterior punches in supine; avoid wall, incline or prone pressups for serratus anterior End range stretching gently without forceful overpressure in all planes (elevation in scapular plane, ER in scapular plane, functional IR) with stretching done for life as part of a daily routine NO UPPER BODY ERGOMETER     CRITERIA FOR DISCHARGE FROM SKILLED PHYSICAL THERAPY  Pain free AROM for shoulder elevation (expect around 135-150)  Functional strength for all ADLs, work tasks, and hobbies approved by Psychologist, sport and exercise  Independence with home maintenance program   NOTES: 1. With proper exercise, motion, strength, and function continue to improve even after one year. 2. The complication rate after surgery is 5 - 8%. Complications include infection, fracture, heterotopic bone formation, nerve injury, instability, rotator cuff  tear, and tuberosity nonunion. Please look for clinical signs, unusual symptoms, or lack of progress with therapy and report those to Dr. Posey Pronto. Prefer more communication than less.  3. The therapy plan above only serves as a guide. Please be aware of specific individualized patient instructions as written on the prescription or through discussions with the surgeon. 4. Please call Dr. Posey Pronto if you have any specific questions or concerns [phone]

## 2022-01-08 LAB — SURGICAL PATHOLOGY

## 2022-04-02 ENCOUNTER — Telehealth: Payer: Self-pay | Admitting: *Deleted

## 2022-04-02 NOTE — Telephone Encounter (Unsigned)
Patient called me on Sunday night to tell me that she has broken her shoulder from a fall at church.  It is her left shoulder.  She is now in physical therapy and they come to her home.  Was told that patient could not have lifted more than 5 pounds at a time.  The patient did 5 loads of laundry on Friday and then Saturday was hurting really bad.  She feels a nodule on the left neck area.  She has had lymphoma before and so she feels like she might have it again.  Told patient that she should be calling the orthopedic doctor because she might of overdid it and then strained that and because the nodule.  And she wanted to see Dr. Rogue Bussing to see if she he could take a look at it.  Dr. Rogue Bussing said that we are going to make her an appointment for symptom management.  And when symptom management gets her he can come in and take a look at it to.  I sent a message by text to the patient to let her know that someone from symptom management would be calling her today with an appointment either today or tomorrow ?

## 2022-04-03 ENCOUNTER — Inpatient Hospital Stay: Payer: Medicare PPO

## 2022-04-03 ENCOUNTER — Inpatient Hospital Stay: Payer: Medicare PPO | Attending: Hospice and Palliative Medicine | Admitting: Hospice and Palliative Medicine

## 2022-04-03 VITALS — BP 124/91 | HR 78 | Temp 99.6°F | Resp 16 | Wt 199.3 lb

## 2022-04-03 DIAGNOSIS — C8590 Non-Hodgkin lymphoma, unspecified, unspecified site: Secondary | ICD-10-CM

## 2022-04-03 DIAGNOSIS — Z8673 Personal history of transient ischemic attack (TIA), and cerebral infarction without residual deficits: Secondary | ICD-10-CM | POA: Diagnosis not present

## 2022-04-03 DIAGNOSIS — Z85118 Personal history of other malignant neoplasm of bronchus and lung: Secondary | ICD-10-CM | POA: Insufficient documentation

## 2022-04-03 DIAGNOSIS — Z8572 Personal history of non-Hodgkin lymphomas: Secondary | ICD-10-CM | POA: Diagnosis present

## 2022-04-03 DIAGNOSIS — Z9221 Personal history of antineoplastic chemotherapy: Secondary | ICD-10-CM | POA: Diagnosis not present

## 2022-04-03 DIAGNOSIS — N189 Chronic kidney disease, unspecified: Secondary | ICD-10-CM | POA: Insufficient documentation

## 2022-04-03 DIAGNOSIS — I4891 Unspecified atrial fibrillation: Secondary | ICD-10-CM | POA: Insufficient documentation

## 2022-04-03 DIAGNOSIS — Z79899 Other long term (current) drug therapy: Secondary | ICD-10-CM | POA: Insufficient documentation

## 2022-04-03 LAB — CBC WITH DIFFERENTIAL/PLATELET
Abs Immature Granulocytes: 0.02 10*3/uL (ref 0.00–0.07)
Basophils Absolute: 0.1 10*3/uL (ref 0.0–0.1)
Basophils Relative: 1 %
Eosinophils Absolute: 0.1 10*3/uL (ref 0.0–0.5)
Eosinophils Relative: 2 %
HCT: 39.6 % (ref 36.0–46.0)
Hemoglobin: 13.3 g/dL (ref 12.0–15.0)
Immature Granulocytes: 0 %
Lymphocytes Relative: 19 %
Lymphs Abs: 1.3 10*3/uL (ref 0.7–4.0)
MCH: 30.9 pg (ref 26.0–34.0)
MCHC: 33.6 g/dL (ref 30.0–36.0)
MCV: 91.9 fL (ref 80.0–100.0)
Monocytes Absolute: 0.5 10*3/uL (ref 0.1–1.0)
Monocytes Relative: 7 %
Neutro Abs: 5 10*3/uL (ref 1.7–7.7)
Neutrophils Relative %: 71 %
Platelets: 237 10*3/uL (ref 150–400)
RBC: 4.31 MIL/uL (ref 3.87–5.11)
RDW: 13.2 % (ref 11.5–15.5)
WBC: 7 10*3/uL (ref 4.0–10.5)
nRBC: 0 % (ref 0.0–0.2)

## 2022-04-03 LAB — COMPREHENSIVE METABOLIC PANEL
ALT: 50 U/L — ABNORMAL HIGH (ref 0–44)
AST: 64 U/L — ABNORMAL HIGH (ref 15–41)
Albumin: 4.1 g/dL (ref 3.5–5.0)
Alkaline Phosphatase: 150 U/L — ABNORMAL HIGH (ref 38–126)
Anion gap: 6 (ref 5–15)
BUN: 18 mg/dL (ref 8–23)
CO2: 28 mmol/L (ref 22–32)
Calcium: 9.4 mg/dL (ref 8.9–10.3)
Chloride: 101 mmol/L (ref 98–111)
Creatinine, Ser: 0.77 mg/dL (ref 0.44–1.00)
GFR, Estimated: >60 mL/min (ref >60)
Glucose, Bld: 114 mg/dL — ABNORMAL HIGH (ref 70–99)
Potassium: 4.7 mmol/L (ref 3.5–5.1)
Sodium: 135 mmol/L (ref 135–145)
Total Bilirubin: 0.7 mg/dL (ref 0.3–1.2)
Total Protein: 8 g/dL (ref 6.5–8.1)

## 2022-04-03 LAB — LACTATE DEHYDROGENASE: LDH: 178 U/L (ref 98–192)

## 2022-04-03 NOTE — Progress Notes (Signed)
Pt presents with concern of nodule to left side of neck. States that nodule appeared about 2-3 weeks ago and was the size of a pea, but has increased in size since then. Denies any pain or tenderness to nodule. Pt also reports that she had a LTSR on 01/04/22. ?

## 2022-04-03 NOTE — Progress Notes (Signed)
? ?[facility]pt[facility] ?[place] [facility] ?[phone] [phone] ? ?Patient Care Team: ?Idelle Crouch, MD as PCP - General (Internal Medicine)  ? ?Name of the patient: Julie Jennings  ?[id number]  ?[birth year]  ? ?Date of visit: 04/03/22 ? ?Reason for Consult: ?Julie Jennings is a 71 y.o. 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  ? ?Patient was hospitalized 01/04/2022 to 01/05/2022 for left total shoulder replacement and biceps tenodesis following a proximal humerus fracture after a mechanical fall. ? ?Patient presents to clinic today for evaluation of an enlarged left submandibular lymph node.  She noticed it last week and reports that it is grown in size over the previous week.  She does have reported allergies with nasal congestion and occasional cough but denies sore throat or oral lesions.  No pain.  No B symptoms reported. ? ?Patient has been working with PT with slow improvement in left extremity range of motion. ? ?Denies any neurologic complaints. Denies recent fevers or illnesses. Denies any easy bleeding or bruising. Reports good appetite and denies weight loss. Denies chest pain. Denies any nausea, vomiting, constipation, or diarrhea. Denies urinary complaints. Patient offers no further specific complaints today. ? ?PAST MEDICAL HISTORY: ?Past Medical History:  ?Diagnosis Date  ? A-fib ([place])   ? Only once  ? Anxiety   ? Arthritis   ? oesteoarthritis  ? BP (high blood pressure) 04/16/2014  ? Chronic kidney disease   ? history nephrolithiasis  ? Depression   ? Dysrhythmia   ? PSVT  ? Endometriosis   ? Herpes zoster   ? History of kidney stones   ? Lung cancer ([place]) left  ? Lymphoma ([place])   ? "stomach"  ? Stroke [facility])   ? ? ?PAST SURGICAL HISTORY:  ?Past Surgical  History:  ?Procedure Laterality Date  ? AUGMENTATION MAMMAPLASTY Bilateral   ? CHOLECYSTECTOMY    ? COLONOSCOPY    ? COLONOSCOPY WITH PROPOFOL N/A 04/14/2018  ? Procedure: COLONOSCOPY WITH PROPOFOL;  Surgeon: Manya Silvas, MD;  Location: [facility] ENDOSCOPY;  Service: Endoscopy;  Laterality: N/A;  ? DILATION AND CURETTAGE OF UTERUS    ? HEMORRHOIDECTOMY WITH HEMORRHOID BANDING    ? HERNIA REPAIR    ? umbilical hernia  ? LUNG REMOVAL, PARTIAL Left   ? REVERSE SHOULDER ARTHROPLASTY Left 01/04/2022  ? Procedure: Left reverse shoulder arthroplasty, biceps tenodesis;  Surgeon: Leim Fabry, MD;  Location: [facility] ORS;  Service: Orthopedics;  Laterality: Left;  ? ? ?HEMATOLOGY/ONCOLOGY HISTORY:  ?Oncology History Overview Note  ?# Diffuse large cell lymphoma. B cell stage IIIA. [2005] ? ?# abnormal liver enzymes. Biopsy is suggestive of fatty liver changes ? ?# carcinoma of lung status post left upper lobe resection in July of 2014; Adenocarcinoma T1N0 M0 tumor EGFR positive ?  ?Cancer of upper lobe of left lung ([place])  ? ? ?ALLERGIES:  is allergic to tylenol [acetaminophen], buspirone, prednisone, and codeine. ? ?MEDICATIONS:  ?Current Outpatient Medications  ?Medication Sig Dispense Refill  ? ALPRAZolam (XANAX) 0.5 MG tablet Take 1 tablet (0.5 mg total) by mouth 3 (three) times daily as needed for anxiety. (Patient taking differently: Take 0.5 mg by mouth 3 (three) times daily.) 21 tablet 0  ? aspirin 81 MG chewable tablet Chew by mouth.    ? erythromycin ophthalmic ointment Place  1 application into the right eye daily as needed (after shot).    ? furosemide (LASIX) 40 MG tablet Take 40 mg by mouth daily.    ? ibuprofen (ADVIL) 200 MG tablet Take 600 mg by mouth at bedtime as needed for mild pain or moderate pain.    ? ketoconazole (NIZORAL) 2 % cream Apply to the feet QHS (Patient not taking: Reported on 01/02/2022) 60 g 3  ? losartan (COZAAR) 100 MG tablet Take 100 mg by mouth daily.    ? meloxicam (MOBIC) 15 MG tablet Take  15 mg by mouth daily.    ? nebivolol (BYSTOLIC) 10 MG tablet Take 10 mg by mouth daily.    ? ondansetron (ZOFRAN) 4 MG tablet Take 1 tablet (4 mg total) by mouth every 6 (six) hours as needed for nausea. 20 tablet 0  ? oxyCODONE (OXY IR/ROXICODONE) 5 MG immediate release tablet Take 1 tablet (5 mg total) by mouth every 4 (four) hours as needed for moderate pain (pain score 4-6). 30 tablet 0  ? pantoprazole (PROTONIX) 40 MG tablet Take 40 mg by mouth 2 (two) times daily.    ? Polyethyl Glycol-Propyl Glycol (SYSTANE) 0.4-0.3 % SOLN Place 1 drop into both eyes daily as needed (Dry eye).    ? tiotropium (SPIRIVA) 18 MCG inhalation capsule Place 18 mcg into inhaler and inhale daily.    ? triamcinolone (NASACORT) 55 MCG/ACT AERO nasal inhaler Place 2 sprays into the nose daily.    ? venlafaxine XR (EFFEXOR-XR) 75 MG 24 hr capsule Take 75 mg by mouth 2 (two) times daily.    ? ?No current facility-administered medications for this visit.  ? ? ?VITAL SIGNS: ?There were no vitals taken for this visit. ?There were no vitals filed for this visit.  ?Estimated body mass index is 34.28 kg/m? as calculated from the following: ?  Height as of 01/04/22: _0  (1.651 m). ?  Weight as of 01/04/22: 206 lb (93.4 kg). ? ?LABS: ?CBC: ?   ?Component Value Date/Time  ? WBC 13.1 (H) 01/05/2022 0913  ? HGB 11.1 (L) 01/05/2022 0913  ? HGB 14.4 03/14/2015 0856  ? HCT 33.4 (L) 01/05/2022 0913  ? HCT 41.6 03/14/2015 0856  ? PLT 202 01/05/2022 0913  ? PLT 189 03/14/2015 0856  ? MCV 96.8 01/05/2022 0913  ? MCV 94 03/14/2015 0856  ? NEUTROABS 5.4 01/03/2022 1109  ? NEUTROABS 4.7 03/14/2015 0856  ? LYMPHSABS 1.2 01/03/2022 1109  ? LYMPHSABS 2.0 03/14/2015 0856  ? MONOABS 0.4 01/03/2022 1109  ? MONOABS 0.6 03/14/2015 0856  ? EOSABS 0.1 01/03/2022 1109  ? EOSABS 0.3 03/14/2015 0856  ? BASOSABS 0.1 01/03/2022 1109  ? BASOSABS 0.1 03/14/2015 0856  ? ?Comprehensive Metabolic Panel: ?   ?Component Value Date/Time  ? NA 135 01/05/2022 0913  ? NA 139 03/14/2015  0856  ? K 4.0 01/05/2022 0913  ? K 4.1 03/14/2015 0856  ? CL 101 01/05/2022 0913  ? CL 103 03/14/2015 0856  ? CO2 28 01/05/2022 0913  ? CO2 28 03/14/2015 0856  ? BUN 10 01/05/2022 0913  ? BUN 19 03/14/2015 0856  ? CREATININE 0.68 01/05/2022 0913  ? CREATININE 0.56 03/14/2015 0856  ? GLUCOSE 123 (H) 01/05/2022 0913  ? GLUCOSE 111 (H) 03/14/2015 0856  ? CALCIUM 8.4 (L) 01/05/2022 0913  ? CALCIUM 9.3 03/14/2015 0856  ? AST 50 (H) 01/03/2022 1109  ? AST 80 (H) 03/14/2015 0856  ? ALT 42 01/03/2022 1109  ? ALT 87 (H)  03/14/2015 0856  ? ALKPHOS 124 01/03/2022 1109  ? ALKPHOS 164 (H) 03/14/2015 0856  ? BILITOT 0.9 01/03/2022 1109  ? BILITOT 0.8 03/14/2015 0856  ? PROT 6.8 01/03/2022 1109  ? PROT 7.7 03/14/2015 0856  ? ALBUMIN 3.4 (L) 01/03/2022 1109  ? ALBUMIN 4.3 03/14/2015 0856  ? ? ?RADIOGRAPHIC STUDIES: ?No results found. ? ?PERFORMANCE STATUS (ECOG) : 0 - Asymptomatic ? ?Review of Systems ?Unless otherwise noted, a complete review of systems is negative. ? ?Physical Exam ?General: NAD ?Cardiovascular: regular rate and rhythm ?Pulmonary: clear ant fields ?Abdomen: soft, nontender, + bowel sounds ?GU: no suprapubic tenderness ?Extremities: no edema, no joint deformities ?Skin: no rashes ?Neurological: Weakness but otherwise nonfocal ? ?Assessment and Plan- Patient is a 71 y.o. 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 ?  ?Enlarged left submandibular lymph node -this could be related to infectious etiology as patient does have some URI symptoms (occasional cough, nasal congestion).  However, given history of lymphoma, which presented with neck mass, we will proceed with labs and CT for further evaluation.  Case and plan discussed with Dr. Rogue Bussing. ? ?Follow-up with Dr. Rogue Bussing in July as previously scheduled.  Patient can be seen  sooner if needed. ? ?Patient expressed understanding and was in agreement with this plan. She also understands that She can call clinic at any time with any questions, concerns, or complaints.  ? ?Thank y

## 2022-04-05 ENCOUNTER — Ambulatory Visit
Admission: RE | Admit: 2022-04-05 | Discharge: 2022-04-05 | Disposition: A | Discharge status: Home / Self Care | Payer: Medicare PPO | Source: Ambulatory Visit | Attending: Hospice and Palliative Medicine | Admitting: Hospice and Palliative Medicine

## 2022-04-05 DIAGNOSIS — C8590 Non-Hodgkin lymphoma, unspecified, unspecified site: Secondary | ICD-10-CM | POA: Diagnosis present

## 2022-04-05 MED ORDER — IOHEXOL 300 MG/ML  SOLN
75.0000 mL | Freq: Once | INTRAMUSCULAR | Status: AC | PRN
  Administered 2022-04-05: 75 mL via INTRAVENOUS

## 2022-04-06 ENCOUNTER — Telehealth: Payer: Self-pay | Admitting: *Deleted

## 2022-04-06 ENCOUNTER — Telehealth: Payer: Self-pay | Admitting: Internal Medicine

## 2022-04-06 DIAGNOSIS — C3412 Malignant neoplasm of upper lobe, left bronchus or lung: Secondary | ICD-10-CM

## 2022-04-06 DIAGNOSIS — R591 Generalized enlarged lymph nodes: Secondary | ICD-10-CM

## 2022-04-06 NOTE — Telephone Encounter (Signed)
Called report ? ?IMPRESSION: ?Multiple enlarged lymph nodes in the left neck with nodal ?heterogeneity. Metastatic squamous cell carcinoma is the primary ?concern with this pattern, see further discussion above. Recommend ?ENT referral. ?  ?  ?Electronically Signed ?  By: Jorje Guild M.D. ?  On: 04/05/2022 20:02 ?  ?

## 2022-04-06 NOTE — Telephone Encounter (Signed)
MD would like to see pt sooner on Tuesday- even if the PET is not available by then.  ?

## 2022-04-06 NOTE — Telephone Encounter (Signed)
I called the pt back, she called me this am 6:30 in am. 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 She saw results and was crying. I told her that I will speak to Dr. B in am when I get to work. Dr. B says that it slooks like it can be cancer, Dr. B is going to call her today and then will make plans after the phone call. She did not answer so I left message ?

## 2022-04-06 NOTE — Telephone Encounter (Signed)
Voicemail left for patient requesting that she call us back to discuss results.  ?

## 2022-04-06 NOTE — Telephone Encounter (Signed)
Dr. B has spoken to patient. ?

## 2022-04-06 NOTE — Telephone Encounter (Signed)
I spoke to pt re: results of the CT scan. Informed Dr.Jeungle. Also encouraged to reach out to ENT office.  ? ?Ordered STAT PET scan.  ? ?Will follow up next week.  ? ?Thanks ?GB ? ? ?

## 2022-04-09 ENCOUNTER — Other Ambulatory Visit: Payer: Self-pay

## 2022-04-09 DIAGNOSIS — C3412 Malignant neoplasm of upper lobe, left bronchus or lung: Secondary | ICD-10-CM

## 2022-04-10 ENCOUNTER — Other Ambulatory Visit: Payer: Medicare PPO

## 2022-04-10 ENCOUNTER — Inpatient Hospital Stay: Payer: Medicare PPO | Admitting: Internal Medicine

## 2022-04-12 ENCOUNTER — Ambulatory Visit: Payer: Medicare PPO

## 2022-04-16 ENCOUNTER — Other Ambulatory Visit: Payer: Self-pay | Admitting: Otolaryngology

## 2022-04-16 ENCOUNTER — Encounter
Admission: RE | Admit: 2022-04-16 | Discharge: 2022-04-16 | Disposition: A | Discharge status: Home / Self Care | Payer: Medicare PPO | Source: Ambulatory Visit | Attending: Internal Medicine | Admitting: Internal Medicine

## 2022-04-16 DIAGNOSIS — R591 Generalized enlarged lymph nodes: Secondary | ICD-10-CM | POA: Insufficient documentation

## 2022-04-16 DIAGNOSIS — C3412 Malignant neoplasm of upper lobe, left bronchus or lung: Secondary | ICD-10-CM | POA: Insufficient documentation

## 2022-04-16 LAB — GLUCOSE, CAPILLARY: Glucose-Capillary: 94 mg/dL (ref 70–99)

## 2022-04-16 MED ORDER — FLUDEOXYGLUCOSE F - 18 (FDG) INJECTION
10.3000 | Freq: Once | INTRAVENOUS | Status: AC | PRN
Start: 2022-04-16 — End: 2022-04-16
  Administered 2022-04-16: 10.98 via INTRAVENOUS

## 2022-04-17 ENCOUNTER — Other Ambulatory Visit: Payer: Self-pay | Admitting: Otolaryngology

## 2022-04-17 ENCOUNTER — Telehealth: Payer: Self-pay | Admitting: *Deleted

## 2022-04-17 DIAGNOSIS — D487 Neoplasm of uncertain behavior of other specified sites: Secondary | ICD-10-CM

## 2022-04-17 NOTE — Progress Notes (Signed)
I spoke to patient regarding results of DPET scan. Will await biopsy end of the week. ? ?Cancel patient appointment for tomorrow - 5/10.  ? ?Reschedule the appointment  to 5/18 or 5/19. Secure chat sent.  ? ?Thanks, ?GB

## 2022-04-17 NOTE — Telephone Encounter (Signed)
dr B called the pt this afternoon and she is getting biopsy on friday and pt feels better after speaking to Dr. B and we are cancelling the appt. until she gets results of bx. pt aware of this and in agreement. ?

## 2022-04-18 ENCOUNTER — Inpatient Hospital Stay: Payer: Medicare PPO | Admitting: Internal Medicine

## 2022-04-19 ENCOUNTER — Other Ambulatory Visit: Payer: Self-pay | Admitting: Internal Medicine

## 2022-04-19 DIAGNOSIS — D4989 Neoplasm of unspecified behavior of other specified sites: Secondary | ICD-10-CM

## 2022-04-20 ENCOUNTER — Ambulatory Visit
Admission: RE | Admit: 2022-04-20 | Discharge: 2022-04-20 | Disposition: A | Discharge status: Home / Self Care | Payer: Medicare PPO | Source: Ambulatory Visit | Attending: Otolaryngology | Admitting: Otolaryngology

## 2022-04-20 VITALS — BP 112/84 | HR 72 | Resp 16

## 2022-04-20 DIAGNOSIS — Z8572 Personal history of non-Hodgkin lymphomas: Secondary | ICD-10-CM | POA: Diagnosis not present

## 2022-04-20 DIAGNOSIS — Z85118 Personal history of other malignant neoplasm of bronchus and lung: Secondary | ICD-10-CM | POA: Insufficient documentation

## 2022-04-20 DIAGNOSIS — D487 Neoplasm of uncertain behavior of other specified sites: Secondary | ICD-10-CM | POA: Insufficient documentation

## 2022-04-20 DIAGNOSIS — R590 Localized enlarged lymph nodes: Secondary | ICD-10-CM | POA: Diagnosis present

## 2022-04-20 NOTE — Procedures (Signed)
Interventional Radiology Procedure: ? ? ?Indications: New left cervical lymphadenopathy, history of lung cancer and lymphoma ? ?Procedure: US guided left cervical lymph node biopsy ? ?Findings: Enlarged hypoechoic left cervical nodes.  Multiple core biopsies obtained from a left jugular node.  ? ?Complications: No immediate complications noted. ?    ?EBL: Minimal ? ?Plan: Discharge to home. ? ? ?Melvina Pangelinan R. Anselm Pancoast, MD  ?Pager: [phone] ? ? ? ?  ?

## 2022-04-24 ENCOUNTER — Encounter: Payer: Self-pay | Admitting: Internal Medicine

## 2022-04-24 ENCOUNTER — Telehealth: Payer: Self-pay | Admitting: Internal Medicine

## 2022-04-24 NOTE — Telephone Encounter (Signed)
I spoke to patient regarding results of the flow cytometry of the lymph node biopsy-suggestive of f lymphoma.  Recommend await pathology work-up. ? ?Schedule follow-up on 5/19 at 2:00 pm-MD: no labs- Dr.B ?

## 2022-04-25 ENCOUNTER — Telehealth: Payer: Self-pay | Admitting: Internal Medicine

## 2022-04-25 DIAGNOSIS — C8331 Diffuse large B-cell lymphoma, lymph nodes of head, face, and neck: Secondary | ICD-10-CM | POA: Insufficient documentation

## 2022-04-25 NOTE — Telephone Encounter (Signed)
Dr. B would like to change the appt to tomorrow (04/26/22) at 8:45.  He will call patient to discuss appt changes. ?

## 2022-04-25 NOTE — Telephone Encounter (Signed)
Spoke to patient regarding results of the biopsy positive for lymphoma.  Follow-up in the clinic at 845 on 5/18. ?GB ?

## 2022-04-26 ENCOUNTER — Encounter: Payer: Self-pay | Admitting: Internal Medicine

## 2022-04-26 ENCOUNTER — Inpatient Hospital Stay: Payer: Medicare PPO

## 2022-04-26 ENCOUNTER — Ambulatory Visit: Payer: Medicare PPO | Admitting: Internal Medicine

## 2022-04-26 ENCOUNTER — Inpatient Hospital Stay: Payer: Medicare PPO | Attending: Internal Medicine | Admitting: Internal Medicine

## 2022-04-26 ENCOUNTER — Other Ambulatory Visit: Payer: Self-pay

## 2022-04-26 VITALS — BP 112/86 | HR 73 | Temp 98.6°F | Ht 65.0 in | Wt 200.4 lb

## 2022-04-26 DIAGNOSIS — Z5111 Encounter for antineoplastic chemotherapy: Secondary | ICD-10-CM | POA: Insufficient documentation

## 2022-04-26 DIAGNOSIS — Z5112 Encounter for antineoplastic immunotherapy: Secondary | ICD-10-CM | POA: Diagnosis present

## 2022-04-26 DIAGNOSIS — Z8673 Personal history of transient ischemic attack (TIA), and cerebral infarction without residual deficits: Secondary | ICD-10-CM | POA: Insufficient documentation

## 2022-04-26 DIAGNOSIS — Z79899 Other long term (current) drug therapy: Secondary | ICD-10-CM | POA: Insufficient documentation

## 2022-04-26 DIAGNOSIS — C3412 Malignant neoplasm of upper lobe, left bronchus or lung: Secondary | ICD-10-CM

## 2022-04-26 DIAGNOSIS — Z85118 Personal history of other malignant neoplasm of bronchus and lung: Secondary | ICD-10-CM | POA: Insufficient documentation

## 2022-04-26 DIAGNOSIS — I4891 Unspecified atrial fibrillation: Secondary | ICD-10-CM | POA: Diagnosis not present

## 2022-04-26 DIAGNOSIS — N189 Chronic kidney disease, unspecified: Secondary | ICD-10-CM | POA: Diagnosis not present

## 2022-04-26 DIAGNOSIS — R7989 Other specified abnormal findings of blood chemistry: Secondary | ICD-10-CM | POA: Diagnosis not present

## 2022-04-26 DIAGNOSIS — C8331 Diffuse large B-cell lymphoma, lymph nodes of head, face, and neck: Secondary | ICD-10-CM

## 2022-04-26 DIAGNOSIS — Z9221 Personal history of antineoplastic chemotherapy: Secondary | ICD-10-CM | POA: Diagnosis not present

## 2022-04-26 LAB — LACTATE DEHYDROGENASE: LDH: 207 U/L — ABNORMAL HIGH (ref 98–192)

## 2022-04-26 LAB — COMPREHENSIVE METABOLIC PANEL
ALT: 49 U/L — ABNORMAL HIGH (ref 0–44)
AST: 59 U/L — ABNORMAL HIGH (ref 15–41)
Albumin: 4 g/dL (ref 3.5–5.0)
Alkaline Phosphatase: 154 U/L — ABNORMAL HIGH (ref 38–126)
Anion gap: 7 (ref 5–15)
BUN: 17 mg/dL (ref 8–23)
CO2: 29 mmol/L (ref 22–32)
Calcium: 9.1 mg/dL (ref 8.9–10.3)
Chloride: 100 mmol/L (ref 98–111)
Creatinine, Ser: 0.76 mg/dL (ref 0.44–1.00)
GFR, Estimated: >60 mL/min (ref >60)
Glucose, Bld: 108 mg/dL — ABNORMAL HIGH (ref 70–99)
Potassium: 3.8 mmol/L (ref 3.5–5.1)
Sodium: 136 mmol/L (ref 135–145)
Total Bilirubin: 0.7 mg/dL (ref 0.3–1.2)
Total Protein: 8.1 g/dL (ref 6.5–8.1)

## 2022-04-26 LAB — CBC WITH DIFFERENTIAL/PLATELET
Abs Immature Granulocytes: 0.02 10*3/uL (ref 0.00–0.07)
Basophils Absolute: 0.1 10*3/uL (ref 0.0–0.1)
Basophils Relative: 1 %
Eosinophils Absolute: 0.1 10*3/uL (ref 0.0–0.5)
Eosinophils Relative: 2 %
HCT: 38.8 % (ref 36.0–46.0)
Hemoglobin: 13 g/dL (ref 12.0–15.0)
Immature Granulocytes: 0 %
Lymphocytes Relative: 18 %
Lymphs Abs: 1.2 10*3/uL (ref 0.7–4.0)
MCH: 31 pg (ref 26.0–34.0)
MCHC: 33.5 g/dL (ref 30.0–36.0)
MCV: 92.4 fL (ref 80.0–100.0)
Monocytes Absolute: 0.4 10*3/uL (ref 0.1–1.0)
Monocytes Relative: 6 %
Neutro Abs: 5.2 10*3/uL (ref 1.7–7.7)
Neutrophils Relative %: 73 %
Platelets: 216 10*3/uL (ref 150–400)
RBC: 4.2 MIL/uL (ref 3.87–5.11)
RDW: 14.1 % (ref 11.5–15.5)
WBC: 7 10*3/uL (ref 4.0–10.5)
nRBC: 0 % (ref 0.0–0.2)

## 2022-04-26 LAB — HEPATITIS B SURFACE ANTIGEN: Hepatitis B Surface Ag: NONREACTIVE

## 2022-04-26 LAB — HEPATITIS B CORE ANTIBODY, IGM: Hep B C IgM: NONREACTIVE

## 2022-04-26 LAB — HEPATITIS C ANTIBODY: HCV Ab: NONREACTIVE

## 2022-04-26 MED ORDER — ONDANSETRON HCL 8 MG PO TABS: ORAL_TABLET | ORAL | 1 refills | Status: DC

## 2022-04-26 MED ORDER — LIDOCAINE-PRILOCAINE 2.5-2.5 % EX CREA: TOPICAL_CREAM | CUTANEOUS | 3 refills | Status: DC

## 2022-04-26 MED ORDER — PROCHLORPERAZINE MALEATE 10 MG PO TABS: 10.0000 mg | ORAL_TABLET | Freq: Four times a day (QID) | ORAL | 1 refills | Status: DC | PRN

## 2022-04-26 MED ORDER — PREDNISONE 50 MG PO TABS: ORAL_TABLET | ORAL | 4 refills | Status: DC

## 2022-04-26 NOTE — Assessment & Plan Note (Addendum)
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  Clinically stage II.  Hold off bone marrow biopsy at this time.  # #Discussed R-CHOP chemotherapy every 3 weeks x 3 cycles-followed by involved field radiation versus R-CHOP x4.  Patient received R-CHOP [2005; hence I would recommend 3 cycles followed by radiation].  Discussed therapies cure. I would do interim scan after 3 cycles of chemo.  I sent a prescription for prednisone.  #Recommend chemo education . Discussed the potential side effects including but not limited to-increasing fatigue, nausea vomiting, diarrhea, hair loss, sores in the mouth, increase risk of infection and also neuropathy. Also discussed regarding potential side effects of heart failure/leukemia with Adriamycin.  2D echo-ordered.   # LUL lung ca- stage I [1250 June]; clinically no evidence of recurrence. Last- CT scan March 2019- NED; STABLE.  No clinical evidence of recurrence.  # Slightly intermittent elevated LFts-?  Fatty liver.  Check hepatitis panel.  # Chemotherapy education; port placement. Hopefully the planned start chemotherapy next 2 weeks. Antiemetics-Zofran and Compazine; EMLA cream sent to pharmacy.   * acyclovir # DISPOSITION: # Chemo education- RCHOP # labs today-ordered hepatitis panel # MUGA scan ASAP # referral to IR for port placement # follow up on May 29th, labs- cbc/cmp/ldh; RCHOP chemo; D-2 Ellen Henri- Dr.B  # I reviewed the blood work- with the patient in detail; also reviewed the imaging independently [as summarized above]; and with the patient in detail.   # 40 minutes face-to-face with the patient discussing the above plan of care; more than 50% of time spent on prognosis/ natural history; counseling and coordination.  Addendum await 2D echo/MUGA scan-given prior anthracycline exposure-we will consider dose adjusted R-EPOCH/ R-CEOP x3 cycles followed by Radiation.  Given the cumbersome/logistical issues with outpatient  R-EPOCH chemotherapy-we will plan R-CEOP q 21 days x3 cycles; with growth factor support. GB

## 2022-04-26 NOTE — Progress Notes (Signed)
Pt states one of her rx's has changed, can't remember the name but starts with a B.

## 2022-04-26 NOTE — Progress Notes (Signed)
[place] OFFICE PROGRESS NOTE  Patient Care Team: Idelle Crouch, MD as PCP - General (Internal Medicine)   Cancer Staging  Diffuse large B-cell lymphoma of lymph nodes of neck [facility]) Staging form: Hodgkin and Non-Hodgkin Lymphoma, AJCC 8th Edition - Clinical: Stage II - Signed by Cammie Sickle, MD on 04/26/2022 Histopathologic type: Malignant lymphoma, large B-cell, diffuse, NOS    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 Biopsy is suggestive of fatty liver changes  # carcinoma of lung status post left upper lobe resection in July of 2014; Adenocarcinoma T1N0 M0 tumor EGFR positive  SURGICAL PATHOLOGY  CASE: [id number]  PATIENT: Julie Jennings  Surgical Pathology Report      Specimen Submitted:  A. Lymph node, left neck   Clinical History: New left cervical lymphadenopathy.  History of  lymphoma and lung cancer.  New lymphadenopathy    MAY 17th, 2023- [Dr.juengle]; A. LYMPH NODE, LEFT NECK; ULTRASOUND-GUIDED BIOPSY:  - FINDINGS COMPATIBLE WITH LARGE B-CELL LYMPHOMA.   Comment:  Core biopsy sections display lymphoid tissue with a somewhat effaced  immunoarchitecture.  Portions of nodal tissue are comprised of sheets of  small lymphocytes, while other areas display dense background fibrosis,  and aggregates of large abnormal lymphocytes with irregular nuclear  contours, open chromatin, visible nucleoli, and retraction artifact.   Immunohistochemical studies demonstrate diffuse positivity for CD20  within regions comprised of larger lymphocytes, compatible with B cells.  CD3 highlights a background of small T cells.  CK AE1/AE3 is negative  for metastatic carcinoma.  PAX5 displays dim, patchy marking of larger  abnormal B cells.  In addition, these B cells appear to display dim  expression of CD10, with positivity for Bcl-2, BCL6, and Mum-1.  B cells  are negative for  CD5.  CD30 displays increased marking.  C-Myc is  positive, staining greater than 40% of larger cells.  CD45 (LCA) is  diffusely positive, without aberrant loss of expression.   Concurrent flow cytometric studies demonstrate a CD10 positive  monoclonal B-cell population in a background of many polytypic B cells,  representing 3% of total viable lymphoid cells and 8% of B cells.  For  further details, see scanned report in [facility].   The patient's history is of diffuse large B-cell lymphoma, as well as  invasive adenocarcinoma of the lung are noted.  Biopsy sections  demonstrate an abnormal proliferation of large B cells, compatible with  involvement by a diffuse large B-cell lymphoma. Further  subclassification is difficult, secondary to limited tissue, however,  presence of C10 marking would suggest a germinal center immunophenotype.  In addition, there does appear to be expression of both Bcl-2 and c-myc,  which may suggest a more aggressive process. There is no evidence of  metastatic adenocarcinoma. FISH testing for prognostically significant  abnormalities of BCL2, BCL6, and MYC will be attempted, and reported as  an addendum.   IMPRESSION: 1. Enlarged hypermetabolic lymph nodes in the bilateral left greater than right neck and asymmetric right palatine tonsil hypermetabolism with associated soft tissue fullness on the CT images, all new since 2014 PET-CT, most compatible with recurrent lymphoma. Deauville category 5.  # MAY 12th, 2023- DLBCL- STAGE II   Cancer of upper lobe of left lung (HCC)  Diffuse large B-cell lymphoma of lymph nodes of neck ([place])  04/25/2022 Initial Diagnosis   Diffuse large B-cell lymphoma of lymph nodes of neck (HCC)  04/26/2022 Cancer Staging   Staging form: Hodgkin and Non-Hodgkin Lymphoma, AJCC 8th Edition - Clinical: Stage II - Signed by Cammie Sickle, MD on 04/26/2022 Histopathologic type: Malignant lymphoma, large B-cell, diffuse, NOS     05/08/2022 -  Chemotherapy   [name]         INTERVAL HISTORY: Patient is accompanied by her cousin [place].  She is ambulating independently.  Julie Jennings 71 y.o.  female pleasant patient above history of Diffuse large cell lymphoma and also history of stage I lung cancer is here for follow-up/review use of the PET scan/pathology.  Of note patient was found to have left-sided neck mass.  Patient also underwent a PET scan.  This was further evaluated by ENT; underwent core biopsy  No nausea no vomiting.  No new lumps or bumps.  No night sweats.  Review of Systems  Constitutional:  Negative for chills, diaphoresis, fever, malaise/fatigue and weight loss.  HENT:  Negative for nosebleeds and sore throat.   Eyes:  Negative for double vision.  Respiratory:  Negative for cough, hemoptysis, sputum production, shortness of breath and wheezing.   Cardiovascular:  Negative for chest pain, palpitations, orthopnea and leg swelling.  Gastrointestinal:  Negative for abdominal pain, blood in stool, constipation, diarrhea, heartburn, melena, nausea and vomiting.  Musculoskeletal:  Positive for joint pain. Negative for back pain.  Skin: Negative.  Negative for itching and rash.  Neurological:  Negative for dizziness, tingling, focal weakness, weakness and headaches.  Endo/Heme/Allergies:  Does not bruise/bleed easily.  Psychiatric/Behavioral:  Negative for depression. The patient is nervous/anxious. The patient does not have insomnia.     PAST MEDICAL HISTORY :  Past Medical History:  Diagnosis Date   A-fib ([place])    Only once   Anxiety    Arthritis    oesteoarthritis   BP (high blood pressure) 04/16/2014   Chronic kidney disease    history nephrolithiasis   Depression    Dysrhythmia    PSVT   Endometriosis    Herpes zoster    History of kidney stones    Lung cancer ([place]) left   Lymphoma ([place])    "stomach"   Stroke  ([place])     PAST SURGICAL HISTORY :   Past Surgical History:  Procedure Laterality Date   AUGMENTATION MAMMAPLASTY Bilateral    CHOLECYSTECTOMY     COLONOSCOPY     COLONOSCOPY WITH PROPOFOL N/A 04/14/2018   Procedure: COLONOSCOPY WITH PROPOFOL;  Surgeon: Manya Silvas, MD;  Location: [facility] ENDOSCOPY;  Service: Endoscopy;  Laterality: N/A;   DILATION AND CURETTAGE OF UTERUS     HEMORRHOIDECTOMY WITH HEMORRHOID BANDING     HERNIA REPAIR     umbilical hernia   LUNG REMOVAL, PARTIAL Left    REVERSE SHOULDER ARTHROPLASTY Left 01/04/2022   Procedure: Left reverse shoulder arthroplasty, biceps tenodesis;  Surgeon: Leim Fabry, MD;  Location: [facility] ORS;  Service: Orthopedics;  Laterality: Left;    FAMILY HISTORY :   Family History  Problem Relation Age of Onset   Stroke Mother    Diabetes Mother    Colon cancer Father    Prostate cancer Father    Breast cancer Neg Hx     SOCIAL HISTORY:   Social History   Tobacco Use   Smoking status: Never   Smokeless tobacco: Never  Vaping Use   Vaping Use: Never used  Substance Use Topics   Alcohol use: No  Drug use: No    ALLERGIES:  is allergic to tylenol [acetaminophen], buspirone, and codeine.  MEDICATIONS:  Current Outpatient Medications  Medication Sig Dispense Refill   ALPRAZolam (XANAX) 0.5 MG tablet Take 1 tablet (0.5 mg total) by mouth 3 (three) times daily as needed for anxiety. (Patient taking differently: Take 0.5 mg by mouth 3 (three) times daily.) 21 tablet 0   aspirin 81 MG chewable tablet Chew by mouth.     erythromycin ophthalmic ointment Place 1 application into the right eye daily as needed (after shot).     ibuprofen (ADVIL) 200 MG tablet Take 600 mg by mouth at bedtime as needed for mild pain or moderate pain.     lidocaine-prilocaine (EMLA) cream Apply on the port. 30 -45 min  prior to port access. 30 g 3   losartan (COZAAR) 100 MG tablet Take 100 mg by mouth daily.     meloxicam (MOBIC) 15 MG tablet Take 15 mg  by mouth daily.     nebivolol (BYSTOLIC) 10 MG tablet Take 10 mg by mouth daily.     ondansetron (ZOFRAN) 4 MG tablet Take 1 tablet (4 mg total) by mouth every 6 (six) hours as needed for nausea. 20 tablet 0   ondansetron (ZOFRAN) 8 MG tablet One pill every 8 hours as needed for nausea/vomitting. 40 tablet 1   oxyCODONE (OXY IR/ROXICODONE) 5 MG immediate release tablet Take 1 tablet (5 mg total) by mouth every 4 (four) hours as needed for moderate pain (pain score 4-6). 30 tablet 0   Polyethyl Glycol-Propyl Glycol (SYSTANE) 0.4-0.3 % SOLN Place 1 drop into both eyes daily as needed (Dry eye).     predniSONE (DELTASONE) 50 MG tablet Take 2 tablets once day x 5 days. START on day of your chemotherapy. Take with food. 10 tablet 4   prochlorperazine (COMPAZINE) 10 MG tablet Take 1 tablet (10 mg total) by mouth every 6 (six) hours as needed for nausea or vomiting. 40 tablet 1   tiotropium (SPIRIVA) 18 MCG inhalation capsule Place 18 mcg into inhaler and inhale daily.     triamcinolone (NASACORT) 55 MCG/ACT AERO nasal inhaler Place 2 sprays into the nose daily.     venlafaxine XR (EFFEXOR-XR) 75 MG 24 hr capsule Take 75 mg by mouth 2 (two) times daily.     furosemide (LASIX) 40 MG tablet Take 40 mg by mouth daily.     ketoconazole (NIZORAL) 2 % cream Apply to the feet QHS (Patient not taking: Reported on 01/02/2022) 60 g 3   pantoprazole (PROTONIX) 40 MG tablet Take 40 mg by mouth 2 (two) times daily.     No current facility-administered medications for this visit.    PHYSICAL EXAMINATION: ECOG PERFORMANCE STATUS: 0 - Asymptomatic  BP 112/86 (BP Location: Right Arm, Patient Position: Sitting, Cuff Size: Normal)   Pulse 73   Temp 98.6 F (37 C) (Tympanic)   Ht '5\' 5"'  (1.651 m)   Wt 200 lb 6.4 oz (90.9 kg)   SpO2 100%   BMI 33.35 kg/m   Filed Weights   04/26/22 0829  Weight: 200 lb 6.4 oz (90.9 kg)    Physical Exam HENT:     Head: Normocephalic and atraumatic.     Mouth/Throat:      Pharynx: No oropharyngeal exudate.  Eyes:     Pupils: Pupils are equal, round, and reactive to light.  Cardiovascular:     Rate and Rhythm: Normal rate and regular rhythm.  Pulmonary:  Effort: Pulmonary effort is normal. No respiratory distress.     Breath sounds: Normal breath sounds. No wheezing.  Abdominal:     General: Bowel sounds are normal. There is no distension.     Palpations: Abdomen is soft. There is no mass.     Tenderness: There is no abdominal tenderness. There is no guarding or rebound.  Musculoskeletal:        General: No tenderness. Normal range of motion.     Cervical back: Normal range of motion and neck supple.  Skin:    General: Skin is warm.  Neurological:     Mental Status: She is alert and oriented to person, place, and time.  Psychiatric:        Mood and Affect: Affect normal.     LABORATORY DATA:  I have reviewed the data as listed    Component Value Date/Time   NA 136 04/26/2022 0937   NA 139 03/14/2015 0856   K 3.8 04/26/2022 0937   K 4.1 03/14/2015 0856   CL 100 04/26/2022 0937   CL 103 03/14/2015 0856   CO2 29 04/26/2022 0937   CO2 28 03/14/2015 0856   GLUCOSE 108 (H) 04/26/2022 0937   GLUCOSE 111 (H) 03/14/2015 0856   BUN 17 04/26/2022 0937   BUN 19 03/14/2015 0856   CREATININE 0.76 04/26/2022 0937   CREATININE 0.56 03/14/2015 0856   CALCIUM 9.1 04/26/2022 0937   CALCIUM 9.3 03/14/2015 0856   PROT 8.1 04/26/2022 0937   PROT 7.7 03/14/2015 0856   ALBUMIN 4.0 04/26/2022 0937   ALBUMIN 4.3 03/14/2015 0856   AST 59 (H) 04/26/2022 0937   AST 80 (H) 03/14/2015 0856   ALT 49 (H) 04/26/2022 0937   ALT 87 (H) 03/14/2015 0856   ALKPHOS 154 (H) 04/26/2022 0937   ALKPHOS 164 (H) 03/14/2015 0856   BILITOT 0.7 04/26/2022 0937   BILITOT 0.8 03/14/2015 0856   GFRNONAA >60 04/26/2022 0937   GFRNONAA >60 03/14/2015 0856   GFRAA >60 07/07/2020 1041   GFRAA >60 03/14/2015 0856    No results found for: SPEP, UPEP  Lab Results  Component  Value Date   WBC 7.0 04/26/2022   NEUTROABS 5.2 04/26/2022   HGB 13.0 04/26/2022   HCT 38.8 04/26/2022   MCV 92.4 04/26/2022   PLT 216 04/26/2022      Chemistry      Component Value Date/Time   NA 136 04/26/2022 0937   NA 139 03/14/2015 0856   K 3.8 04/26/2022 0937   K 4.1 03/14/2015 0856   CL 100 04/26/2022 0937   CL 103 03/14/2015 0856   CO2 29 04/26/2022 0937   CO2 28 03/14/2015 0856   BUN 17 04/26/2022 0937   BUN 19 03/14/2015 0856   CREATININE 0.76 04/26/2022 0937   CREATININE 0.56 03/14/2015 0856      Component Value Date/Time   CALCIUM 9.1 04/26/2022 0937   CALCIUM 9.3 03/14/2015 0856   ALKPHOS 154 (H) 04/26/2022 0937   ALKPHOS 164 (H) 03/14/2015 0856   AST 59 (H) 04/26/2022 0937   AST 80 (H) 03/14/2015 0856   ALT 49 (H) 04/26/2022 0937   ALT 87 (H) 03/14/2015 0856   BILITOT 0.7 04/26/2022 0937   BILITOT 0.8 03/14/2015 0856       RADIOGRAPHIC STUDIES: I have personally reviewed the radiological images as listed and agreed with the findings in the report. No results found.   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  Clinically stage II.  Hold off bone marrow biopsy at this time.  # #Discussed R-CHOP chemotherapy every 3 weeks x 3 cycles-followed by involved field radiation versus R-CHOP x4.  Patient received R-CHOP [2005; hence I would recommend 3 cycles followed by radiation].  Discussed therapies cure. I would do interim scan after 3 cycles of chemo.  I sent a prescription for prednisone.  #Recommend chemo education . Discussed the potential side effects including but not limited to-increasing fatigue, nausea vomiting, diarrhea, hair loss, sores in the mouth, increase risk of infection and also neuropathy. Also discussed regarding potential side effects of heart failure/leukemia with Adriamycin.  2D echo-ordered.   # LUL lung ca- stage I [1655 June];  clinically no evidence of recurrence. Last- CT scan March 2019- NED; STABLE.  No clinical evidence of recurrence.  # Slightly intermittent elevated LFts-?  Fatty liver.  Check hepatitis panel.  # Chemotherapy education; port placement. Hopefully the planned start chemotherapy next 2 weeks. Antiemetics-Zofran and Compazine; EMLA cream sent to pharmacy.   * acyclovir # DISPOSITION: # Chemo education- RCHOP # labs today-ordered hepatitis panel # MUGA scan ASAP # referral to IR for port placement # follow up on May 29th, labs- cbc/cmp/ldh; RCHOP chemo; D-2 Ellen Henri- Dr.B  # I reviewed the blood work- with the patient in detail; also reviewed the imaging independently [as summarized above]; and with the patient in detail.   # 40 minutes face-to-face with the patient discussing the above plan of care; more than 50% of time spent on prognosis/ natural history; counseling and coordination.  Addendum await 2D echo/MUGA scan-given prior anthracycline exposure-we will consider dose adjusted R-EPOCH/ R-CEOP x3 cycles followed by Radiation.    Orders Placed This Encounter  Procedures   NM Cardiac [facility] Rest    Standing Status:   Future    Standing Expiration Date:   04/27/2023    Order Specific Question:   Will [facility] be the location of this test?    Answer:   Yes    Order Specific Question:   Please indicate who you request to read the nuc med / echo results.    Answer:   [facility] Readers    Order Specific Question:   caspofungin (CANCIDAS) frequency    Answer:   Q 24H    Order Specific Question:   Preferred imaging location?    Answer:   [facility]    Order Specific Question:   Radiology Contrast Protocol - do NOT remove file path    Answer:   [email or web address]   IR IMAGING GUIDED PORT INSERTION    Standing Status:   Future    Standing Expiration Date:   04/27/2023    Order Specific Question:   Reason for Exam (SYMPTOM  OR DIAGNOSIS  REQUIRED)    Answer:   lung cancer    Order Specific Question:   Preferred Imaging Location?    Answer:   [facility]   Hepatitis B surface antigen    Standing Status:   Future    Number of Occurrences:   1    Standing Expiration Date:   04/26/2023   Hepatitis B core antibody, IgM    Standing Status:   Future    Number of Occurrences:   1    Standing Expiration Date:   04/26/2023   Hepatitis C antibody    Standing Status:   Future    Number  of Occurrences:   1    Standing Expiration Date:   04/27/2023   CBC with Differential/Platelet    Standing Status:   Future    Number of Occurrences:   1    Standing Expiration Date:   04/27/2023   Comprehensive metabolic panel    Standing Status:   Future    Number of Occurrences:   1    Standing Expiration Date:   04/27/2023   Lactate dehydrogenase    Standing Status:   Future    Number of Occurrences:   1    Standing Expiration Date:   04/27/2023   All questions were answered. The patient knows to call the clinic with any problems, questions or concerns.      Cammie Sickle, MD 04/29/2022 9:57 PM

## 2022-04-26 NOTE — Progress Notes (Signed)
START ON PATHWAY REGIMEN - Lymphoma and CLL     A cycle is every 21 days:     Prednisone      Rituximab-xxxx      Cyclophosphamide      Doxorubicin      Vincristine   **Always confirm dose/schedule in your pharmacy ordering system**  Patient Characteristics: Diffuse Large B-Cell Lymphoma or Follicular Lymphoma, Grade 3B, First Line, Stage I and II, No Bulk Disease Type: Not Applicable Disease Type: Diffuse Large B-Cell Lymphoma Disease Type: Not Applicable Line of therapy: First Line Disease Characteristics: No Bulk Intent of Therapy: Curative Intent, Discussed with Patient

## 2022-04-27 ENCOUNTER — Telehealth: Payer: Self-pay

## 2022-04-27 ENCOUNTER — Inpatient Hospital Stay: Payer: Medicare PPO | Admitting: Internal Medicine

## 2022-04-27 DIAGNOSIS — C3412 Malignant neoplasm of upper lobe, left bronchus or lung: Secondary | ICD-10-CM

## 2022-04-27 NOTE — Telephone Encounter (Signed)
MUGA cancelled and please schedule Echo ASAP.  Thanks

## 2022-04-27 NOTE — Telephone Encounter (Signed)
Message received from [place] department:  There is a new process for a MUGA auth request. When requested on their portal it states no auth required, per our [place] is required from a 3rd party vendor. Request filed and pending medical review. MUGA will need to be cx.

## 2022-04-27 NOTE — Telephone Encounter (Addendum)
ECHO needs to go thru same PA process.  The PA for MUGA is already in process so will hold on scheduling the ECHO at this time.  Pamala Hurry, can you inform patient not able to schedule ECHO at this time?

## 2022-04-29 ENCOUNTER — Encounter: Payer: Self-pay | Admitting: Internal Medicine

## 2022-04-30 ENCOUNTER — Encounter: Payer: Self-pay | Admitting: Internal Medicine

## 2022-04-30 NOTE — Progress Notes (Signed)
I called the patient to discuss chemotherapy Plan changes. The phone got disconnected. On a second call back. I had to leave a voicemail. I will again reach out  to her tomorrow.

## 2022-04-30 NOTE — Progress Notes (Signed)
Pharmacist Chemotherapy Monitoring - Initial Assessment    Anticipated start date: 05/08/22   The following has been reviewed per standard work regarding the patient's treatment regimen: The patient's diagnosis, treatment plan and drug doses, and organ/hematologic function Lab orders and baseline tests specific to treatment regimen  The treatment plan start date, drug sequencing, and pre-medications Prior authorization status  Patient's documented medication list, including drug-drug interaction screen and prescriptions for anti-emetics and supportive care specific to the treatment regimen The drug concentrations, fluid compatibility, administration routes, and timing of the medications to be used The patient's access for treatment and lifetime cumulative dose history, if applicable  The patient's medication allergies and previous infusion related reactions, if applicable   Changes made to treatment plan:  N/A  Follow up needed:  Pending authorization for treatment    Julie Jennings, [place], 04/30/2022  12:56 PM

## 2022-05-01 ENCOUNTER — Encounter: Payer: Self-pay | Admitting: Internal Medicine

## 2022-05-01 LAB — SURGICAL PATHOLOGY

## 2022-05-01 NOTE — Progress Notes (Signed)
Spoke with patient 05/01/22 @ 13:45. Went over pre-procedure instructions to include the need to arrive at 12:30 for 13:30 appointment, need to be NPO 6 hours prior to procedure, need to hold baby aspirin morning of procedure and need for driver post procedure. Patient verbalized understanding.

## 2022-05-02 ENCOUNTER — Encounter
Admission: RE | Admit: 2022-05-02 | Discharge: 2022-05-02 | Disposition: A | Discharge status: Home / Self Care | Payer: Medicare PPO | Source: Ambulatory Visit | Attending: Internal Medicine | Admitting: Internal Medicine

## 2022-05-02 ENCOUNTER — Telehealth: Payer: Self-pay

## 2022-05-02 DIAGNOSIS — C8331 Diffuse large B-cell lymphoma, lymph nodes of head, face, and neck: Secondary | ICD-10-CM | POA: Diagnosis present

## 2022-05-02 DIAGNOSIS — T451X5D Adverse effect of antineoplastic and immunosuppressive drugs, subsequent encounter: Secondary | ICD-10-CM | POA: Insufficient documentation

## 2022-05-02 DIAGNOSIS — I427 Cardiomyopathy due to drug and external agent: Secondary | ICD-10-CM | POA: Diagnosis not present

## 2022-05-02 MED ORDER — TECHNETIUM TC 99M-LABELED RED BLOOD CELLS IV KIT
20.0000 | PACK | Freq: Once | INTRAVENOUS | Status: AC | PRN
  Administered 2022-05-02: 21.59 via INTRAVENOUS

## 2022-05-02 NOTE — Telephone Encounter (Signed)
Order faxed to spec sch for port placement.  Appt 05/04/22 at 1:30 pm, arrive 12:30 pm.  Pt notified.

## 2022-05-03 ENCOUNTER — Inpatient Hospital Stay: Payer: Medicare PPO

## 2022-05-03 ENCOUNTER — Other Ambulatory Visit: Payer: Self-pay | Admitting: Internal Medicine

## 2022-05-03 ENCOUNTER — Other Ambulatory Visit: Payer: Medicare PPO

## 2022-05-03 NOTE — H&P (Signed)
Chief Complaint: Patient was seen in consultation today for port placement.  Referring Physician(s): Cammie Sickle  Supervising Physician: Juliet Rude  Patient Status: [facility] - Out-pt  History of Present Illness: Julie Jennings is a 71 y.o. female with a past medical history significant for anxiety, depression, CKD, HTN, PSVT, CVA, stage I adenocarcinoma of the left lung s/p LUL resection (2014) and diffuse large B cell lymphoma who presents today for a port placement. Julie Jennings was diagnosed with diagnosed with diffuse large B-cell lymphoma 04/25/22 after she noted an enlarging neck mass. She is planned to undergo chemotherapy and IR has been consulted for port placement.  Past Medical History:  Diagnosis Date   A-fib ([place])    Only once   Anxiety    Arthritis    oesteoarthritis   BP (high blood pressure) 04/16/2014   Chronic kidney disease    history nephrolithiasis   Depression    Dysrhythmia    PSVT   Endometriosis    Herpes zoster    History of kidney stones    Lung cancer ([place]) left   Lymphoma ([place])    "stomach"   Stroke [facility])     Past Surgical History:  Procedure Laterality Date   AUGMENTATION MAMMAPLASTY Bilateral    CHOLECYSTECTOMY     COLONOSCOPY     COLONOSCOPY WITH PROPOFOL N/A 04/14/2018   Procedure: COLONOSCOPY WITH PROPOFOL;  Surgeon: Manya Silvas, MD;  Location: [facility] ENDOSCOPY;  Service: Endoscopy;  Laterality: N/A;   DILATION AND CURETTAGE OF UTERUS     HEMORRHOIDECTOMY WITH HEMORRHOID BANDING     HERNIA REPAIR     umbilical hernia   LUNG REMOVAL, PARTIAL Left    REVERSE SHOULDER ARTHROPLASTY Left 01/04/2022   Procedure: Left reverse shoulder arthroplasty, biceps tenodesis;  Surgeon: Leim Fabry, MD;  Location: [facility] ORS;  Service: Orthopedics;  Laterality: Left;    Allergies: Tylenol [acetaminophen], Buspirone, and Codeine  Medications: Prior to Admission medications   Medication Sig Start Date End Date Taking? Authorizing  Provider  ALPRAZolam Duanne Moron) 0.5 MG tablet Take 1 tablet (0.5 mg total) by mouth 3 (three) times daily as needed for anxiety. Patient taking differently: Take 0.5 mg by mouth 3 (three) times daily. 10/02/16   Creola Corn, MD  aspirin 81 MG chewable tablet Chew by mouth.    [provider]  erythromycin ophthalmic ointment Place 1 application into the right eye daily as needed (after shot). 12/21/19   [provider]  furosemide (LASIX) 40 MG tablet Take 40 mg by mouth daily. 09/30/14 04/03/22  [provider]  ibuprofen (ADVIL) 200 MG tablet Take 600 mg by mouth at bedtime as needed for mild pain or moderate pain.    [provider]  ketoconazole (NIZORAL) 2 % cream Apply to the feet QHS Patient not taking: Reported on 01/02/2022 02/06/21   Ralene Bathe, MD  lidocaine-prilocaine (EMLA) cream Apply on the port. 30 -45 min  prior to port access. 04/26/22   Cammie Sickle, MD  losartan (COZAAR) 100 MG tablet Take 100 mg by mouth daily. 06/24/20   [provider]  meloxicam (MOBIC) 15 MG tablet Take 15 mg by mouth daily.    [provider]  nebivolol (BYSTOLIC) 10 MG tablet Take 10 mg by mouth daily. 09/30/14   [provider]  ondansetron (ZOFRAN) 4 MG tablet Take 1 tablet (4 mg total) by mouth every 6 (six) hours as needed for nausea. 01/05/22   Reche Dixon, PA-C  ondansetron (ZOFRAN) 8 MG tablet One pill every 8 hours as needed for nausea/vomitting. 04/26/22   Cammie Sickle, MD  oxyCODONE (OXY IR/ROXICODONE) 5 MG immediate release tablet Take 1 tablet (5 mg total) by mouth every 4 (four) hours as needed for moderate pain (pain score 4-6). 01/05/22   Reche Dixon, PA-C  pantoprazole (PROTONIX) 40 MG tablet Take 40 mg by mouth 2 (two) times daily. 04/22/17 04/03/22  [provider]  Polyethyl Glycol-Propyl Glycol (SYSTANE) 0.4-0.3 % SOLN Place 1 drop into both eyes daily as needed (Dry eye).    [provider]  predniSONE (DELTASONE) 50 MG tablet Take 2 tablets once day x 5 days. START on day of your chemotherapy. Take with food. 04/26/22   Cammie Sickle, MD  prochlorperazine (COMPAZINE) 10 MG tablet Take 1 tablet (10 mg total) by mouth every 6 (six) hours as needed for nausea or vomiting. 04/26/22   Cammie Sickle, MD  tiotropium (SPIRIVA) 18 MCG inhalation capsule Place 18 mcg into inhaler and inhale daily. 09/30/14   [provider]  triamcinolone (NASACORT) 55 MCG/ACT AERO nasal inhaler Place 2 sprays into the nose daily.    [provider]  venlafaxine XR (EFFEXOR-XR) 75 MG 24 hr capsule Take 75 mg by mouth 2 (two) times daily. 07/25/15   [provider]     Family History  Problem Relation Age of Onset   Stroke Mother    Diabetes Mother    Colon cancer Father    Prostate cancer Father    Breast cancer Neg Hx     Social History   Socioeconomic History   Marital status: Widowed    Spouse name: Not on file   Number of children: Not on file   Years of education: Not on file   Highest education level: Not on file  Occupational History   Not on file  Tobacco Use   Smoking status: Never   Smokeless tobacco: Never  Vaping Use   Vaping Use: Never used  Substance and Sexual Activity   Alcohol use: No   Drug use: No   Sexual activity: Yes    Birth control/protection: Post-menopausal  Other Topics Concern   Not on file  Social History Narrative   Not on file   Social Determinants of Health   Financial Resource Strain: Not on file  Food Insecurity: Not on file  Transportation Needs: Not on file  Physical Activity: Not on file  Stress: Not on file  Social Connections: Not on file     Review of Systems: A 12 point ROS discussed and pertinent positives are indicated in the HPI above.  All other systems are negative.  Review of Systems  Constitutional:  Negative for chills and fever.  Respiratory:  Negative for cough and shortness of  breath.   Cardiovascular:  Negative for chest pain.  Gastrointestinal:  Negative for abdominal pain, nausea and vomiting.  Musculoskeletal:  Negative for back pain.       (+) left shoulder and arm pain due to recent surgery  Neurological:  Negative for dizziness and headaches.  Psychiatric/Behavioral:  The patient is nervous/anxious.    Vital Signs: BP 129/75   Pulse 61   Temp 97.8 F (36.6 C) (Oral)   Resp 18   Ht 5\' 7"  (1.702 m)   Wt 199 lb 3.2 oz (90.4 kg)   SpO2 96%   BMI 31.20 kg/m   Physical Exam Vitals reviewed.  Constitutional:  General: She is not in acute distress. HENT:     Head: Normocephalic.     Mouth/Throat:     Mouth: Mucous membranes are moist.     Pharynx: Oropharynx is clear. No oropharyngeal exudate or posterior oropharyngeal erythema.  Cardiovascular:     Rate and Rhythm: Normal rate and regular rhythm.  Pulmonary:     Effort: Pulmonary effort is normal.     Breath sounds: Normal breath sounds.  Abdominal:     General: There is no distension.     Palpations: Abdomen is soft.     Tenderness: There is no abdominal tenderness.  Skin:    General: Skin is warm and dry.  Neurological:     Mental Status: She is alert and oriented to person, place, and time.  Psychiatric:        Mood and Affect: Mood normal.        Behavior: Behavior normal.        Thought Content: Thought content normal.        Judgment: Judgment normal.     MD Evaluation Airway: WNL Heart: WNL Abdomen: WNL Chest/ Lungs: WNL ASA  Classification: 2 Mallampati/Airway Score: Two   Imaging: CT SOFT TISSUE NECK W CONTRAST  Result Date: 04/05/2022 CLINICAL DATA:  Left sided neck lump for 1 week. Remote history of lung cancer lymphoma EXAM: CT NECK WITH CONTRAST TECHNIQUE: Multidetector CT imaging of the neck was performed using the standard protocol following the bolus administration of intravenous contrast. RADIATION DOSE REDUCTION: This exam was performed according to the  departmental dose-optimization program which includes automated exposure control, adjustment of the mA and/or kV according to patient size and/or use of iterative reconstruction technique. CONTRAST:  105mL OMNIPAQUE IOHEXOL 300 MG/ML  SOLN COMPARISON:  None. FINDINGS: Pharynx and larynx: No visible mass or inflammation. There is a band of obscured oral cavity and oropharynx due to dental amalgam Salivary glands: No inflammation, mass, or stone. Thyroid: Normal. Lymph nodes: Enlarged lymph nodes in the left jugular chain with the largest showing heterogeneous enhancement from liquefaction/necrosis, nodes measuring up to 19 mm in the left jugulodigastric station and continuing to the left level 4 neck. No contralateral adenopathy is seen. The nodal heterogeneity is primarily concerning for metastatic squamous cell carcinoma. There is history of very remote lymphoma-untreated lymphoma is usually homogeneous in appearance. Atypical/granulomatous infection is a diagnostic consideration given the short duration of palpable complaint. Vascular: Negative. Limited intracranial: Negative. Visualized orbits: Bilateral cataract resection Mastoids and visualized paranasal sinuses: Clear Skeleton: Generalized cervical spine degeneration with bulky facet spurring. No acute or aggressive finding. Upper chest: Saber trachea and areas of air trapping in the left upper lung adjacent to sutures. These results will be called to the ordering clinician or representative by the Radiologist Assistant, and communication documented in the PACS or [employer]. IMPRESSION: Multiple enlarged lymph nodes in the left neck with nodal heterogeneity. Metastatic squamous cell carcinoma is the primary concern with this pattern, see further discussion above. Recommend ENT referral. Electronically Signed   By: Jorje Guild M.D.   On: 04/05/2022 20:02   NM Cardiac Muga Rest  Result Date: 05/02/2022 CLINICAL DATA:  Diffuse large B-cell lymphoma,  cardiotoxic chemotherapy EXAM: NUCLEAR MEDICINE CARDIAC BLOOD POOL IMAGING (MUGA) TECHNIQUE: Cardiac multi-gated acquisition was performed at rest following intravenous injection of Tc-30m labeled red blood cells. RADIOPHARMACEUTICALS:  21.59 mCi Tc-55m pertechnetate in-vitro labeled red blood cells IV COMPARISON:  None FINDINGS: Calculated LEFT ventricular ejection fraction is 64.0%, normal. Study  was obtained at a cardiac rate of 65 bpm. Patient was rhythmic during imaging. Cine analysis of the LEFT ventricle in 3 projections demonstrates normal up in trickle wall motion. IMPRESSION: Normal LEFT ventricular ejection fraction of 64% with normal LV wall motion. Electronically Signed   By: Lavonia Dana M.D.   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 Additional history of left lung cancer. EXAM: NUCLEAR MEDICINE PET SKULL BASE TO THIGH TECHNIQUE: 11.0 mCi F-18 FDG was injected intravenously. Full-ring PET imaging was performed from the skull base to thigh after the radiotracer. CT data was obtained and used for attenuation correction and anatomic localization. Fasting blood glucose: 94 mg/dl COMPARISON:  10/01/2013 PET-CT. FINDINGS: Mediastinal blood pool activity: SUV max 2.7 Liver activity: SUV max 3.9 NECK: Multiple enlarged hypermetabolic lymph nodes in the left greater than right neck, new since 2014 PET-CT. Representative 1.7 cm left level 2 neck lymph node with max SUV 22.8 (series 2/image 37). Representative 1.0 cm left level 3 neck lymph node with max SUV 12.2 (series 2/image 51). Representative 0.7 cm left level 4 neck lymph node with max SUV 7.9 (series 2/image 57). Representative 0.8 cm right level 2 neck lymph node with max SUV 4.8 (series 2/image 30). Asymmetric right palatine tonsil hypermetabolism with max SUV 9.1 with suggestion of associated  asymmetric soft tissue fullness on the CT images, which are degraded by streak artifact from metallic dental fillings. Incidental CT findings: none CHEST: No enlarged or hypermetabolic axillary, mediastinal or hilar lymph nodes. No hypermetabolic pulmonary findings. Incidental CT findings: Bilateral breast prostheses again noted. Atherosclerotic thoracic aorta with dilated 4.1 cm ascending thoracic aorta. Status post left upper lobectomy. Tiny 0.2 cm peripheral right lower lobe pulmonary nodule (series 2/image 105), below PET resolution, stable since 10/01/2013 PET-CT, considered benign. No new significant pulmonary nodules. ABDOMEN/PELVIS: No abnormal hypermetabolic activity within the liver, pancreas, adrenal glands, or spleen. No hypermetabolic lymph nodes in the abdomen or pelvis. Incidental CT findings: Diffusely irregular liver surface compatible with cirrhosis. Simple central 2.4 cm liver cyst. Cholecystectomy. Atherosclerotic nonaneurysmal abdominal aorta. Simple 2.9 cm posterior upper left renal cyst, for which no follow-up is recommended. SKELETON: No focal hypermetabolic activity to suggest skeletal metastasis. Incidental CT findings: Left total shoulder arthroplasty. IMPRESSION: 1. Enlarged hypermetabolic lymph nodes in the bilateral left greater than right neck and asymmetric right palatine tonsil hypermetabolism with associated soft tissue fullness on the CT images, all new since 2014 PET-CT, most compatible with recurrent lymphoma. Deauville category 5. 2. No evidence of hypermetabolic tumor recurrence in the left lung. No evidence of hypermetabolic metastatic disease in the chest, abdomen, pelvis or skeleton. 3. Chronic findings include: Aortic Atherosclerosis (ICD10-I70.0). Cirrhosis. Electronically Signed   By: Ilona Sorrel M.D.   On: 04/16/2022 12:01   [place] CORE BIOPSY (LYMPH NODES)  Result Date: 04/20/2022 INDICATION: 71 year old with new cervical lymphadenopathy. History of lung cancer and  lymphoma. Tissue diagnosis is needed. EXAM: ULTRASOUND-GUIDED CORE BIOPSY OF LEFT NECK LYMPH NODE MEDICATIONS: Local anesthetic, 1% lidocaine ANESTHESIA/SEDATION: None FLUOROSCOPY TIME:  None COMPLICATIONS: None immediate. PROCEDURE: Informed written consent was obtained from the patient after a thorough discussion of the procedural risks, benefits and alternatives. All questions were addressed. A timeout was performed prior to the initiation of the procedure. Left side of the neck was evaluated with ultrasound. Multiple enlarged lymph nodes identified. A  left jugular lymph node was targeted for biopsy. Left side of the neck was prepped with chlorhexidine and sterile field was created. Skin was anesthetized using 1% lidocaine. Small incision was made. Using ultrasound guidance, 18 gauge core device was directed into the lymph node adjacent to the left jugular vein. Total of 6 core biopsies were obtained. Specimens placed on a Telfa pad with saline. Bandage placed over the puncture site. FINDINGS: Enlarged hypoechoic lymph nodes on the left side of the neck. Largest lymph nodes in the left submandibular region. Prominent lymph node just anterior to the left jugular vein was biopsied. Biopsy needle confirmed within the lesion. IMPRESSION: Ultrasound-guided core biopsy of a left neck lymph node. Electronically Signed   By: Markus Daft M.D.   On: 04/20/2022 15:17    Labs:  CBC: Recent Labs    01/03/22 1109 01/05/22 0913 04/03/22 1054 04/26/22 0937  WBC 7.1 13.1* 7.0 7.0  HGB 11.7* 11.1* 13.3 13.0  HCT 34.8* 33.4* 39.6 38.8  PLT 199 202 237 216    COAGS: No results for input(s): INR, APTT in the last 8760 hours.  BMP: Recent Labs    01/03/22 1109 01/05/22 0913 04/03/22 1054 04/26/22 0937  NA 137 135 135 136  K 3.7 4.0 4.7 3.8  CL 103 101 101 100  CO2 25 28 28 29   GLUCOSE 147* 123* 114* 108*  BUN 15 10 18 17   CALCIUM 9.0 8.4* 9.4 9.1  CREATININE 0.50 0.68 0.77 0.76  GFRNONAA >60 >60 >60  >60    LIVER FUNCTION TESTS: Recent Labs    07/07/21 1035 01/03/22 1109 04/03/22 1054 04/26/22 0937  BILITOT 0.8 0.9 0.7 0.7  AST 64* 50* 64* 59*  ALT 53* 42 50* 49*  ALKPHOS 137* 124 150* 154*  PROT 7.5 6.8 8.0 8.1  ALBUMIN 3.9 3.4* 4.1 4.0    TUMOR MARKERS: No results for input(s): AFPTM, CEA, CA199, CHROMGRNA in the last 8760 hours.  Assessment and Plan:  71 y/o F with recently diagnosed diffuse large B cell lymphoma who presents today for port placement prior to chemotherapy.  Risks and benefits of image-guided Port-a-catheter placement were discussed with the patient including, but not limited to bleeding, infection, pneumothorax, or fibrin sheath development and need for additional procedures.  All of the patient's questions were answered, patient is agreeable to proceed.  Consent signed and in chart.  Thank you for this interesting consult.  I greatly enjoyed meeting Julie Jennings and look forward to participating in their care.  A copy of this report was sent to the requesting provider on this date.  Electronically Signed: Joaquim Nam, PA-C 05/03/2022, 1:11 PM   I spent a total of 30 Minutes   in face to face in clinical consultation, greater than 50% of which was counseling/coordinating care for port placement.

## 2022-05-03 NOTE — Progress Notes (Signed)
Tumor Board Documentation  Julie Jennings was presented by Dr Rogue Bussing at our Tumor Board on 05/03/2022, which included representatives from medical oncology, surgical, pharmacy, pulmonology, radiology, pathology, radiation oncology, navigation, research, internal medicine, palliative care.  Julie Jennings currently presents as a current patient, for [place], for new positive pathology with history of the following treatments: active survellience, neoadjuvant chemotherapy, neoadjuvant radiation.  Additionally, we reviewed previous medical and familial history, history of present illness, and recent lab results along with all available histopathologic and imaging studies. The tumor board considered available treatment options and made the following recommendations: Neoadjuvant chemotherapy (followed by Radiation Therapy)    The following procedures/referrals were also placed: No orders of the defined types were placed in this encounter.   Clinical Trial Status: not discussed   Staging used: AJCC Stage Group AJCC Staging:       Group: Stage II Large B Cell Lyphoma   National site-specific guidelines NCCN were discussed with respect to the case.  Tumor board is a meeting of clinicians from various specialty areas who evaluate and discuss patients for whom a multidisciplinary approach is being considered. Final determinations in the plan of care are those of the provider(s). The responsibility for follow up of recommendations given during tumor board is that of the provider.   Today's extended care, comprehensive team conference, Julie Jennings was not present for the discussion and was not examined.   Multidisciplinary Tumor Board is a multidisciplinary case peer review process.  Decisions discussed in the Multidisciplinary Tumor Board reflect the opinions of the specialists present at the conference without having examined the patient.  Ultimately, treatment and diagnostic decisions rest with the primary  provider(s) and the patient.

## 2022-05-04 ENCOUNTER — Inpatient Hospital Stay: Payer: Medicare PPO

## 2022-05-04 ENCOUNTER — Ambulatory Visit: Payer: Medicare PPO | Admitting: Internal Medicine

## 2022-05-04 ENCOUNTER — Encounter: Payer: Self-pay | Admitting: Internal Medicine

## 2022-05-04 ENCOUNTER — Ambulatory Visit
Admission: RE | Admit: 2022-05-04 | Discharge: 2022-05-04 | Disposition: A | Discharge status: Home / Self Care | Payer: Medicare PPO | Source: Ambulatory Visit | Attending: Internal Medicine | Admitting: Internal Medicine

## 2022-05-04 ENCOUNTER — Inpatient Hospital Stay (HOSPITAL_BASED_OUTPATIENT_CLINIC_OR_DEPARTMENT_OTHER): Payer: Medicare PPO | Admitting: Internal Medicine

## 2022-05-04 ENCOUNTER — Other Ambulatory Visit: Payer: Medicare PPO

## 2022-05-04 ENCOUNTER — Encounter: Payer: Self-pay | Admitting: Radiology

## 2022-05-04 ENCOUNTER — Other Ambulatory Visit: Payer: Self-pay

## 2022-05-04 VITALS — BP 117/76 | HR 78 | Temp 98.7°F | Resp 16 | Wt 199.0 lb

## 2022-05-04 DIAGNOSIS — I129 Hypertensive chronic kidney disease with stage 1 through stage 4 chronic kidney disease, or unspecified chronic kidney disease: Secondary | ICD-10-CM | POA: Diagnosis not present

## 2022-05-04 DIAGNOSIS — F419 Anxiety disorder, unspecified: Secondary | ICD-10-CM | POA: Insufficient documentation

## 2022-05-04 DIAGNOSIS — C8331 Diffuse large B-cell lymphoma, lymph nodes of head, face, and neck: Secondary | ICD-10-CM

## 2022-05-04 DIAGNOSIS — C3412 Malignant neoplasm of upper lobe, left bronchus or lung: Secondary | ICD-10-CM | POA: Insufficient documentation

## 2022-05-04 DIAGNOSIS — D4989 Neoplasm of unspecified behavior of other specified sites: Secondary | ICD-10-CM

## 2022-05-04 DIAGNOSIS — F32A Depression, unspecified: Secondary | ICD-10-CM | POA: Diagnosis not present

## 2022-05-04 DIAGNOSIS — N189 Chronic kidney disease, unspecified: Secondary | ICD-10-CM | POA: Insufficient documentation

## 2022-05-04 DIAGNOSIS — Z5112 Encounter for antineoplastic immunotherapy: Secondary | ICD-10-CM | POA: Diagnosis not present

## 2022-05-04 HISTORY — PX: IR IMAGING GUIDED PORT INSERTION: IMG5740

## 2022-05-04 LAB — COMPREHENSIVE METABOLIC PANEL
ALT: 44 U/L (ref 0–44)
AST: 52 U/L — ABNORMAL HIGH (ref 15–41)
Albumin: 3.8 g/dL (ref 3.5–5.0)
Alkaline Phosphatase: 142 U/L — ABNORMAL HIGH (ref 38–126)
Anion gap: 6 (ref 5–15)
BUN: 17 mg/dL (ref 8–23)
CO2: 30 mmol/L (ref 22–32)
Calcium: 9.1 mg/dL (ref 8.9–10.3)
Chloride: 102 mmol/L (ref 98–111)
Creatinine, Ser: 0.63 mg/dL (ref 0.44–1.00)
GFR, Estimated: >60 mL/min (ref >60)
Glucose, Bld: 101 mg/dL — ABNORMAL HIGH (ref 70–99)
Potassium: 4.6 mmol/L (ref 3.5–5.1)
Sodium: 138 mmol/L (ref 135–145)
Total Bilirubin: 0.8 mg/dL (ref 0.3–1.2)
Total Protein: 7.7 g/dL (ref 6.5–8.1)

## 2022-05-04 LAB — CBC WITH DIFFERENTIAL/PLATELET
Abs Immature Granulocytes: 0.02 10*3/uL (ref 0.00–0.07)
Basophils Absolute: 0.1 10*3/uL (ref 0.0–0.1)
Basophils Relative: 1 %
Eosinophils Absolute: 0.1 10*3/uL (ref 0.0–0.5)
Eosinophils Relative: 2 %
HCT: 37.6 % (ref 36.0–46.0)
Hemoglobin: 12.7 g/dL (ref 12.0–15.0)
Immature Granulocytes: 0 %
Lymphocytes Relative: 18 %
Lymphs Abs: 1.2 10*3/uL (ref 0.7–4.0)
MCH: 31.1 pg (ref 26.0–34.0)
MCHC: 33.8 g/dL (ref 30.0–36.0)
MCV: 92.2 fL (ref 80.0–100.0)
Monocytes Absolute: 0.5 10*3/uL (ref 0.1–1.0)
Monocytes Relative: 7 %
Neutro Abs: 4.6 10*3/uL (ref 1.7–7.7)
Neutrophils Relative %: 72 %
Platelets: 200 10*3/uL (ref 150–400)
RBC: 4.08 MIL/uL (ref 3.87–5.11)
RDW: 14.2 % (ref 11.5–15.5)
WBC: 6.4 10*3/uL (ref 4.0–10.5)
nRBC: 0 % (ref 0.0–0.2)

## 2022-05-04 LAB — GLUCOSE, CAPILLARY: Glucose-Capillary: 98 mg/dL (ref 70–99)

## 2022-05-04 MED ORDER — FENTANYL CITRATE (PF) 100 MCG/2ML IJ SOLN
INTRAMUSCULAR | Status: AC | PRN
  Administered 2022-05-04 (×2): 50 ug via INTRAVENOUS

## 2022-05-04 MED ORDER — ACYCLOVIR 400 MG PO TABS: 400.0000 mg | ORAL_TABLET | Freq: Two times a day (BID) | ORAL | 4 refills | Status: DC

## 2022-05-04 MED ORDER — MIDAZOLAM HCL 2 MG/2ML IJ SOLN
INTRAMUSCULAR | Status: AC
  Filled 2022-05-04: qty 2

## 2022-05-04 MED ORDER — SODIUM CHLORIDE 0.9 % IV SOLN: INTRAVENOUS | Status: DC

## 2022-05-04 MED ORDER — LIDOCAINE-EPINEPHRINE 1 %-1:100000 IJ SOLN
INTRAMUSCULAR | Status: AC
  Administered 2022-05-04: 18 mL
  Filled 2022-05-04: qty 1

## 2022-05-04 MED ORDER — MIDAZOLAM HCL 5 MG/5ML IJ SOLN
INTRAMUSCULAR | Status: AC | PRN
Start: 2022-05-04 — End: 2022-05-04
  Administered 2022-05-04: 1 mg via INTRAVENOUS

## 2022-05-04 MED ORDER — FENTANYL CITRATE (PF) 100 MCG/2ML IJ SOLN
INTRAMUSCULAR | Status: AC
  Filled 2022-05-04: qty 2

## 2022-05-04 MED ORDER — LIDOCAINE-EPINEPHRINE (PF) 2 %-1:200000 IJ SOLN
INTRAMUSCULAR | Status: DC
Start: 2022-05-04 — End: 2022-05-04
  Filled 2022-05-04: qty 20

## 2022-05-04 MED ORDER — HEPARIN SOD (PORK) LOCK FLUSH 100 UNIT/ML IV SOLN
INTRAVENOUS | Status: AC
  Administered 2022-05-04: 500 [IU]
  Filled 2022-05-04: qty 5

## 2022-05-04 MED ORDER — MIDAZOLAM HCL 2 MG/2ML IJ SOLN
INTRAMUSCULAR | Status: AC | PRN
  Administered 2022-05-04: 1 mg via INTRAVENOUS

## 2022-05-04 MED FILL — Dexamethasone Sodium Phosphate Inj 100 MG/10ML: INTRAMUSCULAR | Qty: 1 | Status: AC

## 2022-05-04 NOTE — Progress Notes (Unsigned)
Pt and friend in for follow up, pt anxious about upcoming treatment next week.

## 2022-05-04 NOTE — Procedures (Signed)
Interventional Radiology Procedure Note  Date of Procedure: 05/04/2022  Procedure: Port placement   Findings:  1. Port placement, right chest    Complications: No immediate complications noted.   Estimated Blood Loss: minimal  Follow-up and Recommendations: 1. Ready for use    Albin Felling, MD  Vascular & Interventional Radiology  05/04/2022 3:01 PM

## 2022-05-04 NOTE — Assessment & Plan Note (Addendum)
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  Clinically stage II.  Hold off bone marrow biopsy at this time.  # #Discussed R-CEOP chemotherapy every 3 weeks x 3 cycles-followed by involved field radiation. normal LEFT ventricular ejection fraction of 64% with normal LV wall motion.  #  I reviewed at length the individual components with chemotherapy; and the schedule in detail.  Also discussed the potential side effects including but not limited to-increasing fatigue, nausea vomiting, diarrhea, hair loss, sores in the mouth, increase risk of infection and also neuropathy.   # Slightly intermittent elevated LFts-?  Fatty liver.  Hepatitis panel normal.  # prophylaxis Shingles: start Acyclovir prophylaxis   *D-1;D-2 chemo # DISPOSITION: # Chemo as planned next week. # Follow up in week of June 6th; NP- labs- cbc/cmp # follow up June 19th; MD- cbc/cmp/ldh;june 20th- RCEOP; D-2&3 - etoposide; D-4 Margart Sickles-   Dr.B

## 2022-05-04 NOTE — Progress Notes (Unsigned)
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 Biopsy is suggestive of fatty liver changes  # carcinoma of lung status post left upper lobe resection in July of 2014; Adenocarcinoma T1N0 M0 tumor EGFR positive  SURGICAL PATHOLOGY  CASE: [id number]  PATIENT: Julie Jennings  Surgical Pathology Report      Specimen Submitted:  A. Lymph node, left neck   Clinical History: New left cervical lymphadenopathy.  History of  lymphoma and lung cancer.  New lymphadenopathy    MAY 17th, 2023- [Dr.juengle]; A. LYMPH NODE, LEFT NECK; ULTRASOUND-GUIDED BIOPSY:  - FINDINGS COMPATIBLE WITH LARGE B-CELL LYMPHOMA.   Comment:  Core biopsy sections display lymphoid tissue with a somewhat effaced  immunoarchitecture.  Portions of nodal tissue are comprised of sheets of  small lymphocytes, while other areas display dense background fibrosis,  and aggregates of large abnormal lymphocytes with irregular nuclear  contours, open chromatin, visible nucleoli, and retraction artifact.   Immunohistochemical studies demonstrate diffuse positivity for CD20  within regions comprised of larger lymphocytes, compatible with B cells.  CD3 highlights a background of small T cells.  CK AE1/AE3 is negative  for metastatic carcinoma.  PAX5 displays dim, patchy marking of larger  abnormal B cells.  In addition, these B cells appear to display dim  expression of CD10, with  positivity for Bcl-2, BCL6, and Mum-1.  B cells  are negative for CD5.  CD30 displays increased marking.  C-Myc is  positive, staining greater than 40% of larger cells.  CD45 (LCA) is  diffusely positive, without aberrant loss of expression.   Concurrent flow cytometric studies demonstrate a CD10 positive  monoclonal B-cell population in a background of many polytypic B cells,  representing 3% of total viable lymphoid cells and 8% of B cells.  For  further details, see scanned report in [facility].   The patient's history is of diffuse large B-cell lymphoma, as well as  invasive adenocarcinoma of the lung are noted.  Biopsy sections  demonstrate an abnormal proliferation of large B cells, compatible with  involvement by a diffuse large B-cell lymphoma. Further  subclassification is difficult, secondary to limited tissue, however,  presence of C10 marking would suggest a germinal center immunophenotype.  In addition, there does appear to be expression of both Bcl-2 and c-myc,  which may suggest a more aggressive process. There is no evidence of  metastatic adenocarcinoma. FISH testing for prognostically significant  abnormalities of BCL2, BCL6, and MYC will be attempted, and reported as  an addendum.   IMPRESSION: 1. Enlarged hypermetabolic lymph nodes in the bilateral left greater than right neck and asymmetric right palatine tonsil hypermetabolism with associated soft tissue fullness on the CT images, all new since 2014 PET-CT, most compatible with recurrent lymphoma. Deauville category 5.  # MAY 12th, 2023- DLBCL- STAGE II [NO Bone marrow]; GCB; double expresser-mcy; Bcl-2; QNS-FISH.   # MAY 30th, 2023- RCEOP q 3 W x3-RT   Cancer of upper lobe of left lung ([place])  Diffuse large B-cell lymphoma of lymph nodes of neck ([place])  04/25/2022 Initial Diagnosis   Diffuse large B-cell lymphoma of lymph nodes of neck ([place])   04/26/2022 Cancer Staging   Staging form: Hodgkin and Non-Hodgkin  Lymphoma, AJCC 8th Edition - Clinical: Stage II - Signed by Cammie Sickle, MD on 04/26/2022 Histopathologic type: Malignant lymphoma, large B-cell, diffuse, NOS    05/08/2022 -  Chemotherapy   [name]        INTERVAL HISTORY: Patient is accompanied by her cousin [name].  She is ambulating independently.  Julie Jennings 71 y.o.  female pleasant patient with extreme anxiety and newly diagnosed diffuse large B cell lymphoma [prior history of 2005 DLCBL]  is here to proceed with treatment plan.  Patient denies any worsening enlargement of her lymph nodes.  Denies any weight loss.  No nausea no vomiting.  No new lumps or bumps.  No night sweats.  Review of Systems  Constitutional:  Negative for chills, diaphoresis, fever, malaise/fatigue and weight loss.  HENT:  Negative for nosebleeds and sore throat.   Eyes:  Negative for double vision.  Respiratory:  Negative for cough, hemoptysis, sputum production, shortness of breath and wheezing.   Cardiovascular:  Negative for chest pain, palpitations, orthopnea and leg swelling.  Gastrointestinal:  Negative for abdominal pain, blood in stool, constipation, diarrhea, heartburn, melena, nausea and vomiting.  Musculoskeletal:  Positive for joint pain. Negative for back pain.  Skin: Negative.  Negative for itching and rash.  Neurological:  Negative for dizziness, tingling, focal weakness, weakness and headaches.  Endo/Heme/Allergies:  Does not bruise/bleed easily.  Psychiatric/Behavioral:  Negative for depression. The patient is nervous/anxious. The patient does not have insomnia.     PAST MEDICAL HISTORY :  Past Medical History:  Diagnosis Date   A-fib ([place])    Only once   Anxiety    Arthritis    oesteoarthritis   BP (high blood pressure) 04/16/2014   Chronic kidney disease    history nephrolithiasis   Depression    Dysrhythmia    PSVT   Endometriosis    Herpes  zoster    History of kidney stones    Lung cancer ([place]) left   Lymphoma ([place])    "stomach"   Stroke ([place])     PAST SURGICAL HISTORY :   Past Surgical History:  Procedure Laterality Date   AUGMENTATION MAMMAPLASTY Bilateral    CHOLECYSTECTOMY     COLONOSCOPY     COLONOSCOPY WITH PROPOFOL N/A 04/14/2018   Procedure: COLONOSCOPY WITH PROPOFOL;  Surgeon: Manya Silvas, MD;  Location: [facility] ENDOSCOPY;  Service: Endoscopy;  Laterality: N/A;   DILATION AND CURETTAGE OF UTERUS     HEMORRHOIDECTOMY WITH HEMORRHOID BANDING     HERNIA REPAIR     umbilical hernia   IR IMAGING GUIDED PORT INSERTION  05/04/2022   LUNG REMOVAL, PARTIAL Left    REVERSE SHOULDER ARTHROPLASTY Left 01/04/2022   Procedure: Left reverse shoulder arthroplasty, biceps tenodesis;  Surgeon: Leim Fabry, MD;  Location: [facility] ORS;  Service: Orthopedics;  Laterality: Left;    FAMILY HISTORY :   Family History  Problem Relation Age of Onset   Stroke Mother    Diabetes Mother    Colon cancer Father    Prostate cancer Father    Breast cancer Neg Hx     SOCIAL HISTORY:   Social History   Tobacco Use   Smoking status: Never  Smokeless tobacco: Never  Vaping Use   Vaping Use: Never used  Substance Use Topics   Alcohol use: No   Drug use: No    ALLERGIES:  is allergic to tylenol [acetaminophen], buspirone, and codeine.  MEDICATIONS:  Current Outpatient Medications  Medication Sig Dispense Refill   acyclovir (ZOVIRAX) 400 MG tablet Take 1 tablet (400 mg total) by mouth 2 (two) times daily. 60 tablet 4   ALPRAZolam (XANAX) 0.5 MG tablet Take 1 tablet (0.5 mg total) by mouth 3 (three) times daily as needed for anxiety. (Patient taking differently: Take 0.5 mg by mouth 3 (three) times daily.) 21 tablet 0   aspirin 81 MG chewable tablet Chew by mouth.     erythromycin ophthalmic ointment Place 1 application into the right eye daily as needed (after shot).     furosemide (LASIX) 40 MG tablet Take 40 mg by mouth  daily.     ibuprofen (ADVIL) 200 MG tablet Take 600 mg by mouth at bedtime as needed for mild pain or moderate pain.     ketoconazole (NIZORAL) 2 % cream Apply to the feet QHS 60 g 3   lidocaine-prilocaine (EMLA) cream Apply on the port. 30 -45 min  prior to port access. 30 g 3   losartan (COZAAR) 100 MG tablet Take 100 mg by mouth daily.     meloxicam (MOBIC) 15 MG tablet Take 15 mg by mouth daily.     nebivolol (BYSTOLIC) 10 MG tablet Take 10 mg by mouth daily.     oxyCODONE (OXY IR/ROXICODONE) 5 MG immediate release tablet Take 1 tablet (5 mg total) by mouth every 4 (four) hours as needed for moderate pain (pain score 4-6). 30 tablet 0   pantoprazole (PROTONIX) 40 MG tablet Take 40 mg by mouth 2 (two) times daily.     Polyethyl Glycol-Propyl Glycol (SYSTANE) 0.4-0.3 % SOLN Place 1 drop into both eyes daily as needed (Dry eye).     tiotropium (SPIRIVA) 18 MCG inhalation capsule Place 18 mcg into inhaler and inhale daily.     triamcinolone (NASACORT) 55 MCG/ACT AERO nasal inhaler Place 2 sprays into the nose daily.     venlafaxine XR (EFFEXOR-XR) 75 MG 24 hr capsule Take 75 mg by mouth 2 (two) times daily.     ondansetron (ZOFRAN) 4 MG tablet Take 1 tablet (4 mg total) by mouth every 6 (six) hours as needed for nausea. (Patient not taking: Reported on 05/04/2022) 20 tablet 0   ondansetron (ZOFRAN) 8 MG tablet One pill every 8 hours as needed for nausea/vomitting. (Patient not taking: Reported on 05/04/2022) 40 tablet 1   predniSONE (DELTASONE) 50 MG tablet Take 2 tablets once day x 5 days. START on day of your chemotherapy. Take with food. (Patient not taking: Reported on 05/04/2022) 10 tablet 4   prochlorperazine (COMPAZINE) 10 MG tablet Take 1 tablet (10 mg total) by mouth every 6 (six) hours as needed for nausea or vomiting. (Patient not taking: Reported on 05/04/2022) 40 tablet 1   No current facility-administered medications for this visit.    PHYSICAL EXAMINATION: ECOG PERFORMANCE STATUS: 0  - Asymptomatic  BP 117/76 (BP Location: Right Arm, Patient Position: Sitting)   Pulse 78   Temp 98.7 F (37.1 C) (Tympanic)   Resp 16   Wt 199 lb (90.3 kg)   SpO2 98%   BMI 33.12 kg/m   Filed Weights   05/04/22 1104  Weight: 199 lb (90.3 kg)   Left neck lymphadenopathy 2 to 3  cm in size noted.  Nontender.  Physical Exam HENT:     Head: Normocephalic and atraumatic.     Mouth/Throat:     Pharynx: No oropharyngeal exudate.  Eyes:     Pupils: Pupils are equal, round, and reactive to light.  Cardiovascular:     Rate and Rhythm: Normal rate and regular rhythm.  Pulmonary:     Effort: Pulmonary effort is normal. No respiratory distress.     Breath sounds: Normal breath sounds. No wheezing.  Abdominal:     General: Bowel sounds are normal. There is no distension.     Palpations: Abdomen is soft. There is no mass.     Tenderness: There is no abdominal tenderness. There is no guarding or rebound.  Musculoskeletal:        General: No tenderness. Normal range of motion.     Cervical back: Normal range of motion and neck supple.  Skin:    General: Skin is warm.  Neurological:     Mental Status: She is alert and oriented to person, place, and time.  Psychiatric:        Mood and Affect: Affect normal.     LABORATORY DATA:  I have reviewed the data as listed    Component Value Date/Time   NA 138 05/04/2022 1038   NA 139 03/14/2015 0856   K 4.6 05/04/2022 1038   K 4.1 03/14/2015 0856   CL 102 05/04/2022 1038   CL 103 03/14/2015 0856   CO2 30 05/04/2022 1038   CO2 28 03/14/2015 0856   GLUCOSE 101 (H) 05/04/2022 1038   GLUCOSE 111 (H) 03/14/2015 0856   BUN 17 05/04/2022 1038   BUN 19 03/14/2015 0856   CREATININE 0.63 05/04/2022 1038   CREATININE 0.56 03/14/2015 0856   CALCIUM 9.1 05/04/2022 1038   CALCIUM 9.3 03/14/2015 0856   PROT 7.7 05/04/2022 1038   PROT 7.7 03/14/2015 0856   ALBUMIN 3.8 05/04/2022 1038   ALBUMIN 4.3 03/14/2015 0856   AST 52 (H) 05/04/2022 1038    AST 80 (H) 03/14/2015 0856   ALT 44 05/04/2022 1038   ALT 87 (H) 03/14/2015 0856   ALKPHOS 142 (H) 05/04/2022 1038   ALKPHOS 164 (H) 03/14/2015 0856   BILITOT 0.8 05/04/2022 1038   BILITOT 0.8 03/14/2015 0856   GFRNONAA >60 05/04/2022 1038   GFRNONAA >60 03/14/2015 0856   GFRAA >60 07/07/2020 1041   GFRAA >60 03/14/2015 0856    No results found for: SPEP, UPEP  Lab Results  Component Value Date   WBC 6.4 05/04/2022   NEUTROABS 4.6 05/04/2022   HGB 12.7 05/04/2022   HCT 37.6 05/04/2022   MCV 92.2 05/04/2022   PLT 200 05/04/2022      Chemistry      Component Value Date/Time   NA 138 05/04/2022 1038   NA 139 03/14/2015 0856   K 4.6 05/04/2022 1038   K 4.1 03/14/2015 0856   CL 102 05/04/2022 1038   CL 103 03/14/2015 0856   CO2 30 05/04/2022 1038   CO2 28 03/14/2015 0856   BUN 17 05/04/2022 1038   BUN 19 03/14/2015 0856   CREATININE 0.63 05/04/2022 1038   CREATININE 0.56 03/14/2015 0856      Component Value Date/Time   CALCIUM 9.1 05/04/2022 1038   CALCIUM 9.3 03/14/2015 0856   ALKPHOS 142 (H) 05/04/2022 1038   ALKPHOS 164 (H) 03/14/2015 0856   AST 52 (H) 05/04/2022 1038   AST 80 (H) 03/14/2015 6378  ALT 44 05/04/2022 1038   ALT 87 (H) 03/14/2015 0856   BILITOT 0.8 05/04/2022 1038   BILITOT 0.8 03/14/2015 0856       RADIOGRAPHIC STUDIES: I have personally reviewed the radiological images as listed and agreed with the findings in the report. IR IMAGING GUIDED PORT INSERTION  Result Date: 05/04/2022 INDICATION: Cancer, chemotherapy access EXAM: Port placement using ultrasound and fluoroscopic guidance MEDICATIONS: Per EMR ANESTHESIA/SEDATION: Moderate (conscious) sedation was employed during this procedure. A total of Versed 2 mg and Fentanyl 100 mcg was administered intravenously. Moderate Sedation Time: 25 minutes. The patient's level of consciousness and vital signs were monitored continuously by radiology nursing throughout the procedure under my direct  supervision. FLUOROSCOPY TIME:  Fluoroscopy Time: 0.6 minutes (9 mGy) COMPLICATIONS: None immediate. PROCEDURE: Informed written consent was obtained from the patient after a thorough discussion of the procedural risks, benefits and alternatives. All questions were addressed. Maximal Sterile Barrier Technique was utilized including caps, mask, sterile gowns, sterile gloves, sterile drape, hand hygiene and skin antiseptic. A timeout was performed prior to the initiation of the procedure. The patient was placed supine on the exam table. The right neck and chest was prepped and draped in the standard sterile fashion. A preliminary ultrasound of the right neck was performed and demonstrates a patent right internal jugular vein. A permanent ultrasound image was stored in the electronic medical record. The overlying skin was anesthetized with 1% Lidocaine. Using ultrasound guidance, access was obtained into the right internal jugular vein using a 21 gauge micropuncture set. A wire was advanced into the SVC, a short incision was made at the puncture site, and serial dilatation performed. Next, in an ipsilateral infraclavicular location, an incision was made at the site of the subcutaneous reservoir. Blunt dissection was used to open a pocket to contain the reservoir. A subcutaneous tunnel was then created from the port site to the puncture site. A(n) 8 Fr single lumen catheter was advanced through the tunnel. The catheter was attached to the port and this was placed in the subcutaneous pocket. Under fluoroscopic guidance, a peel away sheath was placed, and the catheter was trimmed to the appropriate length and was advanced into the central veins. The catheter length is 20 cm. The tip of the catheter lies near the superior cavoatrial junction. The port flushes and aspirates appropriately. The port was flushed and locked with heparinized saline. The port pocket was closed in 2 layers using 3-0 and 4-0 Vicryl/absorbable  suture. Dermabond was also applied to both incisions. The patient tolerated the procedure well and was transferred to recovery in stable condition. IMPRESSION: Successful placement of a right-sided chest port via the right internal jugular vein. Port ready for immediate use. Electronically Signed   By: Albin Felling M.D.   On: 05/04/2022 16:18     ASSESSMENT & PLAN:  Diffuse large B-cell lymphoma of lymph nodes of neck ([facility]) #Diffuse B-cell lymphoma-bilateral neck left more than right PET scan.  Clinically stage II.  Hold off bone marrow biopsy at this time.  # #Discussed R-CEOP chemotherapy every 3 weeks x 3 cycles-followed by involved field radiation. normal LEFT ventricular ejection fraction of 64% with normal LV wall motion.  #  I reviewed at length the individual components with chemotherapy; and the schedule in detail.  Also discussed the potential side effects including but not limited to-increasing fatigue, nausea vomiting, diarrhea, hair loss, sores in the mouth, increase risk of infection and also neuropathy.   # Slightly intermittent elevated  LFts-?  Fatty liver.  Hepatitis panel normal.  # prophylaxis Shingles: start Acyclovir prophylaxis   *D-1;D-2 chemo # DISPOSITION: # Chemo as planned next week. # Follow up in week of June 6th; NP- labs- cbc/cmp # follow up June 19th; MD- cbc/cmp/ldh;june 20th- RCEOP; D-2&3 - etoposide; D-4 Margart Sickles-   Dr.B   Orders Placed This Encounter  Procedures   CBC with Differential/Platelet    Standing Status:   Future    Standing Expiration Date:   05/05/2023   Comprehensive metabolic panel    Standing Status:   Future    Standing Expiration Date:   05/05/2023   CBC with Differential/Platelet    Standing Status:   Future    Standing Expiration Date:   05/05/2023   Comprehensive metabolic panel    Standing Status:   Future    Standing Expiration Date:   05/05/2023   Lactate dehydrogenase    Standing Status:   Future    Standing Expiration  Date:   05/05/2023   All questions were answered. The patient knows to call the clinic with any problems, questions or concerns.      Cammie Sickle, MD 05/05/2022 1:21 PM

## 2022-05-05 ENCOUNTER — Encounter: Payer: Self-pay | Admitting: Internal Medicine

## 2022-05-08 ENCOUNTER — Inpatient Hospital Stay: Payer: Medicare PPO

## 2022-05-08 ENCOUNTER — Ambulatory Visit: Payer: Medicare PPO

## 2022-05-08 VITALS — BP 122/65 | HR 67 | Temp 97.7°F | Resp 18 | Ht 67.0 in | Wt 196.2 lb

## 2022-05-08 DIAGNOSIS — C8331 Diffuse large B-cell lymphoma, lymph nodes of head, face, and neck: Secondary | ICD-10-CM

## 2022-05-08 DIAGNOSIS — Z5112 Encounter for antineoplastic immunotherapy: Secondary | ICD-10-CM | POA: Diagnosis not present

## 2022-05-08 LAB — COMPREHENSIVE METABOLIC PANEL
ALT: 47 U/L — ABNORMAL HIGH (ref 0–44)
AST: 56 U/L — ABNORMAL HIGH (ref 15–41)
Albumin: 3.6 g/dL (ref 3.5–5.0)
Alkaline Phosphatase: 143 U/L — ABNORMAL HIGH (ref 38–126)
Anion gap: 8 (ref 5–15)
BUN: 18 mg/dL (ref 8–23)
CO2: 27 mmol/L (ref 22–32)
Calcium: 8.8 mg/dL — ABNORMAL LOW (ref 8.9–10.3)
Chloride: 102 mmol/L (ref 98–111)
Creatinine, Ser: 0.65 mg/dL (ref 0.44–1.00)
GFR, Estimated: >60 mL/min (ref >60)
Glucose, Bld: 124 mg/dL — ABNORMAL HIGH (ref 70–99)
Potassium: 3.4 mmol/L — ABNORMAL LOW (ref 3.5–5.1)
Sodium: 137 mmol/L (ref 135–145)
Total Bilirubin: 0.8 mg/dL (ref 0.3–1.2)
Total Protein: 7 g/dL (ref 6.5–8.1)

## 2022-05-08 LAB — CBC WITH DIFFERENTIAL/PLATELET
Abs Immature Granulocytes: 0.03 10*3/uL (ref 0.00–0.07)
Basophils Absolute: 0.1 10*3/uL (ref 0.0–0.1)
Basophils Relative: 1 %
Eosinophils Absolute: 0.1 10*3/uL (ref 0.0–0.5)
Eosinophils Relative: 2 %
HCT: 36.5 % (ref 36.0–46.0)
Hemoglobin: 12.4 g/dL (ref 12.0–15.0)
Immature Granulocytes: 0 %
Lymphocytes Relative: 18 %
Lymphs Abs: 1.4 10*3/uL (ref 0.7–4.0)
MCH: 31 pg (ref 26.0–34.0)
MCHC: 34 g/dL (ref 30.0–36.0)
MCV: 91.3 fL (ref 80.0–100.0)
Monocytes Absolute: 0.5 10*3/uL (ref 0.1–1.0)
Monocytes Relative: 6 %
Neutro Abs: 5.4 10*3/uL (ref 1.7–7.7)
Neutrophils Relative %: 73 %
Platelets: 196 10*3/uL (ref 150–400)
RBC: 4 MIL/uL (ref 3.87–5.11)
RDW: 14 % (ref 11.5–15.5)
WBC: 7.5 10*3/uL (ref 4.0–10.5)
nRBC: 0 % (ref 0.0–0.2)

## 2022-05-08 LAB — LACTATE DEHYDROGENASE: LDH: 172 U/L (ref 98–192)

## 2022-05-08 MED ORDER — DIPHENHYDRAMINE HCL 25 MG PO CAPS
50.0000 mg | ORAL_CAPSULE | Freq: Once | ORAL | Status: AC
  Administered 2022-05-08: 50 mg via ORAL
  Filled 2022-05-08: qty 2

## 2022-05-08 MED ORDER — SODIUM CHLORIDE 0.9 % IV SOLN
375.0000 mg/m2 | Freq: Once | INTRAVENOUS | Status: AC
  Administered 2022-05-08: 800 mg via INTRAVENOUS
  Filled 2022-05-08: qty 50

## 2022-05-08 MED ORDER — SODIUM CHLORIDE 0.9 % IV SOLN
10.0000 mg | Freq: Once | INTRAVENOUS | Status: AC
  Administered 2022-05-08: 10 mg via INTRAVENOUS
  Filled 2022-05-08: qty 10

## 2022-05-08 MED ORDER — HEPARIN SOD (PORK) LOCK FLUSH 100 UNIT/ML IV SOLN
500.0000 [IU] | Freq: Once | INTRAVENOUS | Status: AC | PRN
  Administered 2022-05-08: 500 [IU]
  Filled 2022-05-08: qty 5

## 2022-05-08 MED ORDER — SODIUM CHLORIDE 0.9 % IV SOLN
50.0000 mg/m2 | Freq: Once | INTRAVENOUS | Status: AC
  Administered 2022-05-08: 100 mg via INTRAVENOUS
  Filled 2022-05-08: qty 5

## 2022-05-08 MED ORDER — VINCRISTINE SULFATE CHEMO INJECTION 1 MG/ML
2.0000 mg | Freq: Once | INTRAVENOUS | Status: AC
  Administered 2022-05-08: 2 mg via INTRAVENOUS
  Filled 2022-05-08: qty 2

## 2022-05-08 MED ORDER — ACETAMINOPHEN 325 MG PO TABS
650.0000 mg | ORAL_TABLET | Freq: Once | ORAL | Status: AC
  Administered 2022-05-08: 650 mg via ORAL
  Filled 2022-05-08: qty 2

## 2022-05-08 MED ORDER — SODIUM CHLORIDE 0.9 % IV SOLN
Freq: Once | INTRAVENOUS | Status: AC
  Filled 2022-05-08: qty 250

## 2022-05-08 MED ORDER — SODIUM CHLORIDE 0.9 % IV SOLN
735.0000 mg/m2 | Freq: Once | INTRAVENOUS | Status: AC
  Administered 2022-05-08: 1500 mg via INTRAVENOUS
  Filled 2022-05-08: qty 50

## 2022-05-08 MED ORDER — PALONOSETRON HCL INJECTION 0.25 MG/5ML
0.2500 mg | Freq: Once | INTRAVENOUS | Status: AC
  Administered 2022-05-08: 0.25 mg via INTRAVENOUS
  Filled 2022-05-08: qty 5

## 2022-05-08 MED ORDER — SODIUM CHLORIDE 0.9% FLUSH
10.0000 mL | INTRAVENOUS | Status: DC | PRN
  Filled 2022-05-08: qty 10

## 2022-05-08 NOTE — Patient Instructions (Signed)
[facility] [place]  Discharge Instructions: Thank you for choosing [facility] to provide your oncology and hematology care.  If you have a lab appointment with the [facility], please go directly to the [facility] and check in at the registration area.  Wear comfortable clothing and clothing appropriate for easy access to any Portacath or PICC line.   We strive to give you quality time with your provider. You may need to reschedule your appointment if you arrive late (15 or more minutes).  Arriving late affects you and other patients whose appointments are after yours.  Also, if you miss three or more appointments without notifying the office, you may be dismissed from the clinic at the provider's discretion.      For prescription refill requests, have your pharmacy contact our office and allow 72 hours for refills to be completed.    Today you received the following chemotherapy and/or immunotherapy agents VINCRISTINE, CYTOXAN, ETOPOSIDE, RUXICENE      To help prevent nausea and vomiting after your treatment, we encourage you to take your nausea medication as directed.  BELOW ARE SYMPTOMS THAT SHOULD BE REPORTED IMMEDIATELY: *FEVER GREATER THAN 100.4 F (38 C) OR HIGHER *CHILLS OR SWEATING *NAUSEA AND VOMITING THAT IS NOT CONTROLLED WITH YOUR NAUSEA MEDICATION *UNUSUAL SHORTNESS OF BREATH *UNUSUAL BRUISING OR BLEEDING *URINARY PROBLEMS (pain or burning when urinating, or frequent urination) *BOWEL PROBLEMS (unusual diarrhea, constipation, pain near the anus) TENDERNESS IN MOUTH AND THROAT WITH OR WITHOUT PRESENCE OF ULCERS (sore throat, sores in mouth, or a toothache) UNUSUAL RASH, SWELLING OR PAIN  UNUSUAL VAGINAL DISCHARGE OR ITCHING   Items with * indicate a potential emergency and should be followed up as soon as possible or go to the Emergency Department if any problems should occur.  Please show the CHEMOTHERAPY ALERT CARD or  IMMUNOTHERAPY ALERT CARD at check-in to the Emergency Department and triage nurse.  Should you have questions after your visit or need to cancel or reschedule your appointment, please contact [facility] [place] [facility] [place]  [phone] and follow the prompts.  Office hours are 8:00 a.m. to 4:30 p.m. Monday - Friday. Please note that voicemails left after 4:00 p.m. may not be returned until the following business day.  We are closed weekends and major holidays. You have access to a nurse at all times for urgent questions. Please call the main number to the clinic [phone] and follow the prompts.  For any non-urgent questions, you may also contact your provider using MyChart. We now offer e-Visits for anyone 41 and older to request care online for non-urgent symptoms. For details visit [email or web address].   Also download the MyChart app! Go to the app store, search "MyChart", open the app, select [facility], and log in with your MyChart username and password.  Due to Covid, a mask is required upon entering the hospital/clinic. If you do not have a mask, one will be given to you upon arrival. For doctor visits, patients may have 1 support person aged 40 or older with them. For treatment visits, patients cannot have anyone with them due to current Covid guidelines and our immunocompromised population.   Vincristine injection What is this medication? VINCRISTINE (vin KRIS teen) is a chemotherapy drug. It slows the growth of cancer cells. This medicine is used to treat many types of cancer like Hodgkin's disease, leukemia, non-Hodgkin's lymphoma, neuroblastoma (brain cancer), rhabdomyosarcoma, and Wilms' tumor. This medicine may be used for other purposes;  ask your health care provider or pharmacist if you have questions. COMMON BRAND NAME(S): Oncovin, Vincasar PFS What should I tell my care team before I take this medication? They need to know if you have any of these  conditions: blood disorders gout infection (especially chickenpox, cold sores, or herpes) kidney disease liver disease lung disease nervous system disease like Charcot-Marie-Tooth (CMT) recent or ongoing radiation therapy an unusual or allergic reaction to vincristine, other chemotherapy agents, other medicines, foods, dyes, or preservatives pregnant or trying to get pregnant breast-feeding How should I use this medication? This drug is given as an infusion into a vein. It is administered in a hospital or clinic by a specially trained health care professional. If you have pain, swelling, burning, or any unusual feeling around the site of your injection, tell your health care professional right away. Talk to your pediatrician regarding the use of this medicine in children. While this drug may be prescribed for selected conditions, precautions do apply. Overdosage: If you think you have taken too much of this medicine contact a poison control center or emergency room at once. NOTE: This medicine is only for you. Do not share this medicine with others. What if I miss a dose? It is important not to miss your dose. Call your doctor or health care professional if you are unable to keep an appointment. What may interact with this medication? certain medicines for fungal infections like itraconazole, ketoconazole, posaconazole, voriconazole certain medicines for seizures like phenytoin This list may not describe all possible interactions. Give your health care provider a list of all the medicines, herbs, non-prescription drugs, or dietary supplements you use. Also tell them if you smoke, drink alcohol, or use illegal drugs. Some items may interact with your medicine. What should I watch for while using this medication? This drug may make you feel generally unwell. This is not uncommon, as chemotherapy can affect healthy cells as well as cancer cells. Report any side effects. Continue your course of  treatment even though you feel ill unless your doctor tells you to stop. You may need blood work done while you are taking this medicine. This medicine will cause constipation. Try to have a bowel movement at least every 2 to 3 days. If you do not have a bowel movement for 3 days, call your doctor or health care professional. In some cases, you may be given additional medicines to help with side effects. Follow all directions for their use. Do not become pregnant while taking this medicine. Women should inform their doctor if they wish to become pregnant or think they might be pregnant. There is a potential for serious side effects to an unborn child. Talk to your health care professional or pharmacist for more information. Do not breast-feed an infant while taking this medicine. This medicine may make it more difficult to get pregnant or to father a child. Talk to your healthcare professional if you are concerned about your fertility. What side effects may I notice from receiving this medication? Side effects that you should report to your doctor or health care professional as soon as possible: allergic reactions like skin rash, itching or hives, swelling of the face, lips, or tongue breathing problems confusion or changes in emotions or moods constipation cough mouth sores muscle weakness nausea and vomiting pain, swelling, redness or irritation at the injection site pain, tingling, numbness in the hands or feet problems with balance, talking, walking seizures stomach pain trouble passing urine or change in the  amount of urine Side effects that usually do not require medical attention (report to your doctor or health care professional if they continue or are bothersome): diarrhea hair loss jaw pain loss of appetite This list may not describe all possible side effects. Call your doctor for medical advice about side effects. You may report side effects to FDA at 1-800-FDA-1088. Where  should I keep my medication? This drug is given in a hospital or clinic and will not be stored at home. NOTE: This sheet is a summary. It may not cover all possible information. If you have questions about this medicine, talk to your doctor, pharmacist, or health care provider.  2023 Elsevier/Gold Standard (2021-10-27 00:00:00)  Cyclophosphamide Injection What is this medication? CYCLOPHOSPHAMIDE (sye kloe FOSS fa mide) is a chemotherapy drug. It slows the growth of cancer cells. This medicine is used to treat many types of cancer like lymphoma, myeloma, leukemia, breast cancer, and ovarian cancer, to name a few. This medicine may be used for other purposes; ask your health care provider or pharmacist if you have questions. COMMON BRAND NAME(S): Cyclophosphamide, Cytoxan, Neosar What should I tell my care team before I take this medication? They need to know if you have any of these conditions: heart disease history of irregular heartbeat infection kidney disease liver disease low blood counts, like white cells, platelets, or red blood cells on hemodialysis recent or ongoing radiation therapy scarring or thickening of the lungs trouble passing urine an unusual or allergic reaction to cyclophosphamide, other medicines, foods, dyes, or preservatives pregnant or trying to get pregnant breast-feeding How should I use this medication? This drug is usually given as an injection into a vein or muscle or by infusion into a vein. It is administered in a hospital or clinic by a specially trained health care professional. Talk to your pediatrician regarding the use of this medicine in children. Special care may be needed. Overdosage: If you think you have taken too much of this medicine contact a poison control center or emergency room at once. NOTE: This medicine is only for you. Do not share this medicine with others. What if I miss a dose? It is important not to miss your dose. Call your  doctor or health care professional if you are unable to keep an appointment. What may interact with this medication? amphotericin B azathioprine certain antivirals for HIV or hepatitis certain medicines for blood pressure, heart disease, irregular heart beat certain medicines that treat or prevent blood clots like warfarin certain other medicines for cancer cyclosporine etanercept indomethacin medicines that relax muscles for surgery medicines to increase blood counts metronidazole This list may not describe all possible interactions. Give your health care provider a list of all the medicines, herbs, non-prescription drugs, or dietary supplements you use. Also tell them if you smoke, drink alcohol, or use illegal drugs. Some items may interact with your medicine. What should I watch for while using this medication? Your condition will be monitored carefully while you are receiving this medicine. You may need blood work done while you are taking this medicine. Drink water or other fluids as directed. Urinate often, even at night. Some products may contain alcohol. Ask your health care professional if this medicine contains alcohol. Be sure to tell all health care professionals you are taking this medicine. Certain medicines, like metronidazole and disulfiram, can cause an unpleasant reaction when taken with alcohol. The reaction includes flushing, headache, nausea, vomiting, sweating, and increased thirst. The reaction can last from  30 minutes to several hours. Do not become pregnant while taking this medicine or for 1 year after stopping it. Women should inform their health care professional if they wish to become pregnant or think they might be pregnant. Men should not father a child while taking this medicine and for 4 months after stopping it. There is potential for serious side effects to an unborn child. Talk to your health care professional for more information. Do not breast-feed an  infant while taking this medicine or for 1 week after stopping it. This medicine has caused ovarian failure in some women. This medicine may make it more difficult to get pregnant. Talk to your health care professional if you are concerned about your fertility. This medicine has caused decreased sperm counts in some men. This may make it more difficult to father a child. Talk to your health care professional if you are concerned about your fertility. Call your health care professional for advice if you get a fever, chills, or sore throat, or other symptoms of a cold or flu. Do not treat yourself. This medicine decreases your body's ability to fight infections. Try to avoid being around people who are sick. Avoid taking medicines that contain aspirin, acetaminophen, ibuprofen, naproxen, or ketoprofen unless instructed by your health care professional. These medicines may hide a fever. Talk to your health care professional about your risk of cancer. You may be more at risk for certain types of cancer if you take this medicine. If you are going to need surgery or other procedure, tell your health care professional that you are using this medicine. Be careful brushing or flossing your teeth or using a toothpick because you may get an infection or bleed more easily. If you have any dental work done, tell your dentist you are receiving this medicine. What side effects may I notice from receiving this medication? Side effects that you should report to your doctor or health care professional as soon as possible: allergic reactions like skin rash, itching or hives, swelling of the face, lips, or tongue breathing problems nausea, vomiting signs and symptoms of bleeding such as bloody or black, tarry stools; red or dark brown urine; spitting up blood or brown material that looks like coffee grounds; red spots on the skin; unusual bruising or bleeding from the eyes, gums, or nose signs and symptoms of heart  failure like fast, irregular heartbeat, sudden weight gain; swelling of the ankles, feet, hands signs and symptoms of infection like fever; chills; cough; sore throat; pain or trouble passing urine signs and symptoms of kidney injury like trouble passing urine or change in the amount of urine signs and symptoms of liver injury like dark yellow or brown urine; general ill feeling or flu-like symptoms; light-colored stools; loss of appetite; nausea; right upper belly pain; unusually weak or tired; yellowing of the eyes or skin Side effects that usually do not require medical attention (report to your doctor or health care professional if they continue or are bothersome): confusion decreased hearing diarrhea facial flushing hair loss headache loss of appetite missed menstrual periods signs and symptoms of low red blood cells or anemia such as unusually weak or tired; feeling faint or lightheaded; falls skin discoloration This list may not describe all possible side effects. Call your doctor for medical advice about side effects. You may report side effects to FDA at 1-800-FDA-1088. Where should I keep my medication? This drug is given in a hospital or clinic and will not be stored  at home. NOTE: This sheet is a summary. It may not cover all possible information. If you have questions about this medicine, talk to your doctor, pharmacist, or health care provider.  2023 Elsevier/Gold Standard (2021-10-27 00:00:00)  Etoposide, VP-16 injection What is this medication? ETOPOSIDE, VP-16 (e toe POE side) is a chemotherapy drug. It is used to treat testicular cancer, lung cancer, and other cancers. This medicine may be used for other purposes; ask your health care provider or pharmacist if you have questions. COMMON BRAND NAME(S): Etopophos, Toposar, VePesid What should I tell my care team before I take this medication? They need to know if you have any of these conditions: infection kidney  disease liver disease low blood counts, like low white cell, platelet, or red cell counts an unusual or allergic reaction to etoposide, other medicines, foods, dyes, or preservatives pregnant or trying to get pregnant breast-feeding How should I use this medication? This medicine is for infusion into a vein. It is administered in a hospital or clinic by a specially trained health care professional. Talk to your pediatrician regarding the use of this medicine in children. Special care may be needed. Overdosage: If you think you have taken too much of this medicine contact a poison control center or emergency room at once. NOTE: This medicine is only for you. Do not share this medicine with others. What if I miss a dose? It is important not to miss your dose. Call your doctor or health care professional if you are unable to keep an appointment. What may interact with this medication? This medicine may interact with the following medications: warfarin This list may not describe all possible interactions. Give your health care provider a list of all the medicines, herbs, non-prescription drugs, or dietary supplements you use. Also tell them if you smoke, drink alcohol, or use illegal drugs. Some items may interact with your medicine. What should I watch for while using this medication? Visit your doctor for checks on your progress. This drug may make you feel generally unwell. This is not uncommon, as chemotherapy can affect healthy cells as well as cancer cells. Report any side effects. Continue your course of treatment even though you feel ill unless your doctor tells you to stop. In some cases, you may be given additional medicines to help with side effects. Follow all directions for their use. Call your doctor or health care professional for advice if you get a fever, chills or sore throat, or other symptoms of a cold or flu. Do not treat yourself. This drug decreases your body's ability to fight  infections. Try to avoid being around people who are sick. This medicine may increase your risk to bruise or bleed. Call your doctor or health care professional if you notice any unusual bleeding. Talk to your doctor about your risk of cancer. You may be more at risk for certain types of cancers if you take this medicine. Do not become pregnant while taking this medicine or for at least 6 months after stopping it. Women should inform their doctor if they wish to become pregnant or think they might be pregnant. Women of child-bearing potential will need to have a negative pregnancy test before starting this medicine. There is a potential for serious side effects to an unborn child. Talk to your health care professional or pharmacist for more information. Do not breast-feed an infant while taking this medicine. Men must use a latex condom during sexual contact with a woman while taking this  medicine and for at least 4 months after stopping it. A latex condom is needed even if you have had a vasectomy. Contact your doctor right away if your partner becomes pregnant. Do not donate sperm while taking this medicine and for at least 4 months after you stop taking this medicine. Men should inform their doctors if they wish to father a child. This medicine may lower sperm counts. What side effects may I notice from receiving this medication? Side effects that you should report to your doctor or health care professional as soon as possible: allergic reactions like skin rash, itching or hives, swelling of the face, lips, or tongue low blood counts - this medicine may decrease the number of white blood cells, red blood cells, and platelets. You may be at increased risk for infections and bleeding nausea, vomiting redness, blistering, peeling or loosening of the skin, including inside the mouth signs and symptoms of infection like fever; chills; cough; sore throat; pain or trouble passing urine signs and symptoms of  low red blood cells or anemia such as unusually weak or tired; feeling faint or lightheaded; falls; breathing problems unusual bruising or bleeding Side effects that usually do not require medical attention (report to your doctor or health care professional if they continue or are bothersome): changes in taste diarrhea hair loss loss of appetite mouth sores This list may not describe all possible side effects. Call your doctor for medical advice about side effects. You may report side effects to FDA at 1-800-FDA-1088. Where should I keep my medication? This drug is given in a hospital or clinic and will not be stored at home. NOTE: This sheet is a summary. It may not cover all possible information. If you have questions about this medicine, talk to your doctor, pharmacist, or health care provider.  2023 Elsevier/Gold Standard (2021-10-27 00:00:00)   Rituximab Injection What is this medication? RITUXIMAB (ri TUX i mab) is a monoclonal antibody. It is used to treat certain types of cancer like non-Hodgkin lymphoma and chronic lymphocytic leukemia. It is also used to treat rheumatoid arthritis, granulomatosis with polyangiitis, microscopic polyangiitis, and pemphigus vulgaris. This medicine may be used for other purposes; ask your health care provider or pharmacist if you have questions. COMMON BRAND NAME(S): RIABNI, Rituxan, RUXIENCE, truxima What should I tell my care team before I take this medication? They need to know if you have any of these conditions: chest pain heart disease infection especially a viral infection such as chickenpox, cold sores, hepatitis B, or herpes immune system problems irregular heartbeat or rhythm kidney disease low blood counts (white cells, platelets, or red cells) lung disease recent or upcoming vaccine an unusual or allergic reaction to rituximab, other medicines, foods, dyes, or preservatives pregnant or trying to get pregnant breast-feeding How  should I use this medication? This medicine is injected into a vein. It is given by a health care provider in a hospital or clinic setting. A special MedGuide will be given to you before each treatment. Be sure to read this information carefully each time. Talk to your health care provider about the use of this medicine in children. While this drug may be prescribed for children as young as 6 months for selected conditions, precautions do apply. Overdosage: If you think you have taken too much of this medicine contact a poison control center or emergency room at once. NOTE: This medicine is only for you. Do not share this medicine with others. What if I miss a dose?  Keep appointments for follow-up doses. It is important not to miss your dose. Call your health care provider if you are unable to keep an appointment. What may interact with this medication? Do not take this medicine with any of the following medicines: live vaccines This medicine may also interact with the following medicines: cisplatin This list may not describe all possible interactions. Give your health care provider a list of all the medicines, herbs, non-prescription drugs, or dietary supplements you use. Also tell them if you smoke, drink alcohol, or use illegal drugs. Some items may interact with your medicine. What should I watch for while using this medication? Your condition will be monitored carefully while you are receiving this medicine. You may need blood work done while you are taking this medicine. This medicine can cause serious infusion reactions. To reduce the risk your health care provider may give you other medicines to take before receiving this one. Be sure to follow the directions from your health care provider. This medicine may increase your risk of getting an infection. Call your health care provider for advice if you get a fever, chills, sore throat, or other symptoms of a cold or flu. Do not treat yourself.  Try to avoid being around people who are sick. Call your health care provider if you are around anyone with measles, chickenpox, or if you develop sores or blisters that do not heal properly. Avoid taking medicines that contain aspirin, acetaminophen, ibuprofen, naproxen, or ketoprofen unless instructed by your health care provider. These medicines may hide a fever. This medicine may cause serious skin reactions. They can happen weeks to months after starting the medicine. Contact your health care provider right away if you notice fevers or flu-like symptoms with a rash. The rash may be red or purple and then turn into blisters or peeling of the skin. Or, you might notice a red rash with swelling of the face, lips or lymph nodes in your neck or under your arms. In some patients, this medicine may cause a serious brain infection that may cause death. If you have any problems seeing, thinking, speaking, walking, or standing, tell your healthcare professional right away. If you cannot reach your healthcare professional, urgently seek other source of medical care. Do not become pregnant while taking this medicine or for at least 12 months after stopping it. Women should inform their health care provider if they wish to become pregnant or think they might be pregnant. There is potential for serious harm to an unborn child. Talk to your health care provider for more information. Women should use a reliable form of birth control while taking this medicine and for 12 months after stopping it. Do not breast-feed while taking this medicine or for at least 6 months after stopping it. What side effects may I notice from receiving this medication? Side effects that you should report to your health care provider as soon as possible: allergic reactions (skin rash, itching or hives; swelling of the face, lips, or tongue) diarrhea edema (sudden weight gain; swelling of the ankles, feet, hands or other unusual swelling;  trouble breathing) fast, irregular heartbeat heart attack (trouble breathing; pain or tightness in the chest, neck, back or arms; unusually weak or tired) infection (fever, chills, cough, sore throat, pain or trouble passing urine) kidney injury (trouble passing urine or change in the amount of urine) liver injury (dark yellow or brown urine; general ill feeling or flu-like symptoms; loss of appetite, right upper belly pain; unusually  weak or tired, yellowing of the eyes or skin) low blood pressure (dizziness; feeling faint or lightheaded, falls; unusually weak or tired) low red blood cell counts (trouble breathing; feeling faint; lightheaded, falls; unusually weak or tired) mouth sores redness, blistering, peeling, or loosening of the skin, including inside the mouth stomach pain unusual bruising or bleeding wheezing (trouble breathing with loud or whistling sounds) vomiting Side effects that usually do not require medical attention (report to your health care provider if they continue or are bothersome): headache joint pain muscle cramps, pain nausea This list may not describe all possible side effects. Call your doctor for medical advice about side effects. You may report side effects to FDA at 1-800-FDA-1088. Where should I keep my medication? This medicine is given in a hospital or clinic. It will not be stored at home. NOTE: This sheet is a summary. It may not cover all possible information. If you have questions about this medicine, talk to your doctor, pharmacist, or health care provider.  2023 Elsevier/Gold Standard (2020-11-28 00:00:00)

## 2022-05-09 ENCOUNTER — Telehealth: Payer: Self-pay

## 2022-05-09 ENCOUNTER — Inpatient Hospital Stay: Payer: Medicare PPO

## 2022-05-09 ENCOUNTER — Ambulatory Visit: Payer: Medicare PPO

## 2022-05-09 VITALS — BP 133/65 | HR 68 | Temp 97.2°F | Resp 20

## 2022-05-09 DIAGNOSIS — C8331 Diffuse large B-cell lymphoma, lymph nodes of head, face, and neck: Secondary | ICD-10-CM

## 2022-05-09 DIAGNOSIS — Z5112 Encounter for antineoplastic immunotherapy: Secondary | ICD-10-CM | POA: Diagnosis not present

## 2022-05-09 MED ORDER — SODIUM CHLORIDE 0.9% FLUSH
10.0000 mL | INTRAVENOUS | Status: DC | PRN
  Administered 2022-05-09: 10 mL
  Filled 2022-05-09: qty 10

## 2022-05-09 MED ORDER — SODIUM CHLORIDE 0.9 % IV SOLN
50.0000 mg/m2 | Freq: Once | INTRAVENOUS | Status: AC
  Administered 2022-05-09: 100 mg via INTRAVENOUS
  Filled 2022-05-09: qty 5

## 2022-05-09 MED ORDER — SODIUM CHLORIDE 0.9 % IV SOLN
Freq: Once | INTRAVENOUS | Status: AC
  Filled 2022-05-09: qty 250

## 2022-05-09 MED ORDER — PROCHLORPERAZINE MALEATE 10 MG PO TABS
10.0000 mg | ORAL_TABLET | Freq: Once | ORAL | Status: AC
  Administered 2022-05-09: 10 mg via ORAL
  Filled 2022-05-09: qty 1

## 2022-05-09 MED ORDER — HEPARIN SOD (PORK) LOCK FLUSH 100 UNIT/ML IV SOLN
INTRAVENOUS | Status: AC
  Filled 2022-05-09: qty 5

## 2022-05-09 MED ORDER — HEPARIN SOD (PORK) LOCK FLUSH 100 UNIT/ML IV SOLN
500.0000 [IU] | Freq: Once | INTRAVENOUS | Status: AC | PRN
  Administered 2022-05-09: 500 [IU]
  Filled 2022-05-09: qty 5

## 2022-05-09 NOTE — Telephone Encounter (Signed)
Telephone call to patient for follow up after first infusion yesterday.   No answer and phone went silent.   Not able to leave voice mail.

## 2022-05-09 NOTE — Patient Instructions (Signed)
[facility] CANCER CTR AT [place]  Discharge Instructions: Thank you for choosing [facility] to provide your oncology and hematology care.  If you have a lab appointment with the [facility], please go directly to the [facility] and check in at the registration area.  Wear comfortable clothing and clothing appropriate for easy access to any Portacath or PICC line.   We strive to give you quality time with your provider. You may need to reschedule your appointment if you arrive late (15 or more minutes).  Arriving late affects you and other patients whose appointments are after yours.  Also, if you miss three or more appointments without notifying the office, you may be dismissed from the clinic at the provider's discretion.      For prescription refill requests, have your pharmacy contact our office and allow 72 hours for refills to be completed.    Today you received the following chemotherapy and/or immunotherapy agents: Etoposide      To help prevent nausea and vomiting after your treatment, we encourage you to take your nausea medication as directed.  BELOW ARE SYMPTOMS THAT SHOULD BE REPORTED IMMEDIATELY: *FEVER GREATER THAN 100.4 F (38 C) OR HIGHER *CHILLS OR SWEATING *NAUSEA AND VOMITING THAT IS NOT CONTROLLED WITH YOUR NAUSEA MEDICATION *UNUSUAL SHORTNESS OF BREATH *UNUSUAL BRUISING OR BLEEDING *URINARY PROBLEMS (pain or burning when urinating, or frequent urination) *BOWEL PROBLEMS (unusual diarrhea, constipation, pain near the anus) TENDERNESS IN MOUTH AND THROAT WITH OR WITHOUT PRESENCE OF ULCERS (sore throat, sores in mouth, or a toothache) UNUSUAL RASH, SWELLING OR PAIN  UNUSUAL VAGINAL DISCHARGE OR ITCHING   Items with * indicate a potential emergency and should be followed up as soon as possible or go to the Emergency Department if any problems should occur.  Please show the CHEMOTHERAPY ALERT CARD or IMMUNOTHERAPY ALERT CARD at check-in to  the Emergency Department and triage nurse.  Should you have questions after your visit or need to cancel or reschedule your appointment, please contact [facility] CANCER [place] AT [place]  [phone] and follow the prompts.  Office hours are 8:00 a.m. to 4:30 p.m. Monday - Friday. Please note that voicemails left after 4:00 p.m. may not be returned until the following business day.  We are closed weekends and major holidays. You have access to a nurse at all times for urgent questions. Please call the main number to the clinic [phone] and follow the prompts.  For any non-urgent questions, you may also contact your provider using MyChart. We now offer e-Visits for anyone 48 and older to request care online for non-urgent symptoms. For details visit [email or web address].   Also download the MyChart app! Go to the app store, search "MyChart", open the app, select [place], and log in with your MyChart username and password.  Due to Covid, a mask is required upon entering the hospital/clinic. If you do not have a mask, one will be given to you upon arrival. For doctor visits, patients may have 1 support person aged 49 or older with them. For treatment visits, patients cannot have anyone with them due to current Covid guidelines and our immunocompromised population.

## 2022-05-10 ENCOUNTER — Telehealth: Payer: Self-pay | Admitting: Oncology

## 2022-05-10 ENCOUNTER — Inpatient Hospital Stay: Payer: Medicare PPO

## 2022-05-10 VITALS — BP 152/80 | HR 70 | Temp 96.7°F | Resp 18

## 2022-05-10 DIAGNOSIS — Z9049 Acquired absence of other specified parts of digestive tract: Secondary | ICD-10-CM | POA: Insufficient documentation

## 2022-05-10 DIAGNOSIS — M2550 Pain in unspecified joint: Secondary | ICD-10-CM | POA: Insufficient documentation

## 2022-05-10 DIAGNOSIS — Z85118 Personal history of other malignant neoplasm of bronchus and lung: Secondary | ICD-10-CM | POA: Insufficient documentation

## 2022-05-10 DIAGNOSIS — Z800 Family history of malignant neoplasm of digestive organs: Secondary | ICD-10-CM | POA: Insufficient documentation

## 2022-05-10 DIAGNOSIS — Z5111 Encounter for antineoplastic chemotherapy: Secondary | ICD-10-CM | POA: Insufficient documentation

## 2022-05-10 DIAGNOSIS — I4891 Unspecified atrial fibrillation: Secondary | ICD-10-CM | POA: Insufficient documentation

## 2022-05-10 DIAGNOSIS — I10 Essential (primary) hypertension: Secondary | ICD-10-CM | POA: Insufficient documentation

## 2022-05-10 DIAGNOSIS — Z791 Long term (current) use of non-steroidal anti-inflammatories (NSAID): Secondary | ICD-10-CM | POA: Insufficient documentation

## 2022-05-10 DIAGNOSIS — N189 Chronic kidney disease, unspecified: Secondary | ICD-10-CM | POA: Insufficient documentation

## 2022-05-10 DIAGNOSIS — R748 Abnormal levels of other serum enzymes: Secondary | ICD-10-CM | POA: Insufficient documentation

## 2022-05-10 DIAGNOSIS — K760 Fatty (change of) liver, not elsewhere classified: Secondary | ICD-10-CM | POA: Insufficient documentation

## 2022-05-10 DIAGNOSIS — K5900 Constipation, unspecified: Secondary | ICD-10-CM | POA: Insufficient documentation

## 2022-05-10 DIAGNOSIS — R634 Abnormal weight loss: Secondary | ICD-10-CM | POA: Insufficient documentation

## 2022-05-10 DIAGNOSIS — Z87442 Personal history of urinary calculi: Secondary | ICD-10-CM | POA: Insufficient documentation

## 2022-05-10 DIAGNOSIS — R531 Weakness: Secondary | ICD-10-CM | POA: Insufficient documentation

## 2022-05-10 DIAGNOSIS — Z7952 Long term (current) use of systemic steroids: Secondary | ICD-10-CM | POA: Insufficient documentation

## 2022-05-10 DIAGNOSIS — R590 Localized enlarged lymph nodes: Secondary | ICD-10-CM | POA: Insufficient documentation

## 2022-05-10 DIAGNOSIS — Z510 Encounter for antineoplastic radiation therapy: Secondary | ICD-10-CM | POA: Insufficient documentation

## 2022-05-10 DIAGNOSIS — C8331 Diffuse large B-cell lymphoma, lymph nodes of head, face, and neck: Secondary | ICD-10-CM | POA: Insufficient documentation

## 2022-05-10 DIAGNOSIS — Z7689 Persons encountering health services in other specified circumstances: Secondary | ICD-10-CM | POA: Insufficient documentation

## 2022-05-10 DIAGNOSIS — Z79899 Other long term (current) drug therapy: Secondary | ICD-10-CM | POA: Insufficient documentation

## 2022-05-10 DIAGNOSIS — Z8673 Personal history of transient ischemic attack (TIA), and cerebral infarction without residual deficits: Secondary | ICD-10-CM | POA: Insufficient documentation

## 2022-05-10 MED ORDER — SODIUM CHLORIDE 0.9% FLUSH
10.0000 mL | INTRAVENOUS | Status: DC | PRN
  Administered 2022-05-10: 10 mL
  Filled 2022-05-10: qty 10

## 2022-05-10 MED ORDER — HEPARIN SOD (PORK) LOCK FLUSH 100 UNIT/ML IV SOLN
500.0000 [IU] | Freq: Once | INTRAVENOUS | Status: AC | PRN
  Administered 2022-05-10: 500 [IU]
  Filled 2022-05-10: qty 5

## 2022-05-10 MED ORDER — PROCHLORPERAZINE MALEATE 10 MG PO TABS
10.0000 mg | ORAL_TABLET | Freq: Once | ORAL | Status: AC
  Administered 2022-05-10: 10 mg via ORAL
  Filled 2022-05-10: qty 1

## 2022-05-10 MED ORDER — SODIUM CHLORIDE 0.9 % IV SOLN
50.0000 mg/m2 | Freq: Once | INTRAVENOUS | Status: AC
  Administered 2022-05-10: 100 mg via INTRAVENOUS
  Filled 2022-05-10: qty 5

## 2022-05-10 MED ORDER — SODIUM CHLORIDE 0.9 % IV SOLN
Freq: Once | INTRAVENOUS | Status: AC
  Filled 2022-05-10: qty 250

## 2022-05-10 NOTE — Patient Instructions (Signed)
[facility] CANCER CTR AT [place]  Discharge Instructions: Thank you for choosing [place] [facility] to provide your oncology and hematology care.  If you have a lab appointment with the [facility], please go directly to the [place] and check in at the registration area.  Wear comfortable clothing and clothing appropriate for easy access to any Portacath or PICC line.   We strive to give you quality time with your provider. You may need to reschedule your appointment if you arrive late (15 or more minutes).  Arriving late affects you and other patients whose appointments are after yours.  Also, if you miss three or more appointments without notifying the office, you may be dismissed from the clinic at the provider's discretion.      For prescription refill requests, have your pharmacy contact our office and allow 72 hours for refills to be completed.    Today you received the following chemotherapy and/or immunotherapy agents ETOPOSIDE      To help prevent nausea and vomiting after your treatment, we encourage you to take your nausea medication as directed.  BELOW ARE SYMPTOMS THAT SHOULD BE REPORTED IMMEDIATELY: *FEVER GREATER THAN 100.4 F (38 C) OR HIGHER *CHILLS OR SWEATING *NAUSEA AND VOMITING THAT IS NOT CONTROLLED WITH YOUR NAUSEA MEDICATION *UNUSUAL SHORTNESS OF BREATH *UNUSUAL BRUISING OR BLEEDING *URINARY PROBLEMS (pain or burning when urinating, or frequent urination) *BOWEL PROBLEMS (unusual diarrhea, constipation, pain near the anus) TENDERNESS IN MOUTH AND THROAT WITH OR WITHOUT PRESENCE OF ULCERS (sore throat, sores in mouth, or a toothache) UNUSUAL RASH, SWELLING OR PAIN  UNUSUAL VAGINAL DISCHARGE OR ITCHING   Items with * indicate a potential emergency and should be followed up as soon as possible or go to the Emergency Department if any problems should occur.  Please show the CHEMOTHERAPY ALERT CARD or IMMUNOTHERAPY ALERT CARD at check-in to  the Emergency Department and triage nurse.  Should you have questions after your visit or need to cancel or reschedule your appointment, please contact [facility] CANCER [place] AT [place]  [phone] and follow the prompts.  Office hours are 8:00 a.m. to 4:30 p.m. Monday - Friday. Please note that voicemails left after 4:00 p.m. may not be returned until the following business day.  We are closed weekends and major holidays. You have access to a nurse at all times for urgent questions. Please call the main number to the clinic [phone] and follow the prompts.  For any non-urgent questions, you may also contact your provider using MyChart. We now offer e-Visits for anyone 45 and older to request care online for non-urgent symptoms. For details visit [email or web address].   Also download the MyChart app! Go to the app store, search "MyChart", open the app, select [place], and log in with your MyChart username and password.  Due to Covid, a mask is required upon entering the hospital/clinic. If you do not have a mask, one will be given to you upon arrival. For doctor visits, patients may have 1 support person aged 33 or older with them. For treatment visits, patients cannot have anyone with them due to current Covid guidelines and our immunocompromised population.   Etoposide, VP-16 injection What is this medication? ETOPOSIDE, VP-16 (e toe POE side) is a chemotherapy drug. It is used to treat testicular cancer, lung cancer, and other cancers. This medicine may be used for other purposes; ask your health care provider or pharmacist if you have questions. COMMON BRAND NAME(S): Etopophos, Toposar, VePesid What  should I tell my care team before I take this medication? They need to know if you have any of these conditions: infection kidney disease liver disease low blood counts, like low white cell, platelet, or red cell counts an unusual or allergic reaction to etoposide, other  medicines, foods, dyes, or preservatives pregnant or trying to get pregnant breast-feeding How should I use this medication? This medicine is for infusion into a vein. It is administered in a hospital or clinic by a specially trained health care professional. Talk to your pediatrician regarding the use of this medicine in children. Special care may be needed. Overdosage: If you think you have taken too much of this medicine contact a poison control center or emergency room at once. NOTE: This medicine is only for you. Do not share this medicine with others. What if I miss a dose? It is important not to miss your dose. Call your doctor or health care professional if you are unable to keep an appointment. What may interact with this medication? This medicine may interact with the following medications: warfarin This list may not describe all possible interactions. Give your health care provider a list of all the medicines, herbs, non-prescription drugs, or dietary supplements you use. Also tell them if you smoke, drink alcohol, or use illegal drugs. Some items may interact with your medicine. What should I watch for while using this medication? Visit your doctor for checks on your progress. This drug may make you feel generally unwell. This is not uncommon, as chemotherapy can affect healthy cells as well as cancer cells. Report any side effects. Continue your course of treatment even though you feel ill unless your doctor tells you to stop. In some cases, you may be given additional medicines to help with side effects. Follow all directions for their use. Call your doctor or health care professional for advice if you get a fever, chills or sore throat, or other symptoms of a cold or flu. Do not treat yourself. This drug decreases your body's ability to fight infections. Try to avoid being around people who are sick. This medicine may increase your risk to bruise or bleed. Call your doctor or health  care professional if you notice any unusual bleeding. Talk to your doctor about your risk of cancer. You may be more at risk for certain types of cancers if you take this medicine. Do not become pregnant while taking this medicine or for at least 6 months after stopping it. Women should inform their doctor if they wish to become pregnant or think they might be pregnant. Women of child-bearing potential will need to have a negative pregnancy test before starting this medicine. There is a potential for serious side effects to an unborn child. Talk to your health care professional or pharmacist for more information. Do not breast-feed an infant while taking this medicine. Men must use a latex condom during sexual contact with a woman while taking this medicine and for at least 4 months after stopping it. A latex condom is needed even if you have had a vasectomy. Contact your doctor right away if your partner becomes pregnant. Do not donate sperm while taking this medicine and for at least 4 months after you stop taking this medicine. Men should inform their doctors if they wish to father a child. This medicine may lower sperm counts. What side effects may I notice from receiving this medication? Side effects that you should report to your doctor or health care professional  as soon as possible: allergic reactions like skin rash, itching or hives, swelling of the face, lips, or tongue low blood counts - this medicine may decrease the number of white blood cells, red blood cells, and platelets. You may be at increased risk for infections and bleeding nausea, vomiting redness, blistering, peeling or loosening of the skin, including inside the mouth signs and symptoms of infection like fever; chills; cough; sore throat; pain or trouble passing urine signs and symptoms of low red blood cells or anemia such as unusually weak or tired; feeling faint or lightheaded; falls; breathing problems unusual bruising or  bleeding Side effects that usually do not require medical attention (report to your doctor or health care professional if they continue or are bothersome): changes in taste diarrhea hair loss loss of appetite mouth sores This list may not describe all possible side effects. Call your doctor for medical advice about side effects. You may report side effects to FDA at 1-800-FDA-1088. Where should I keep my medication? This drug is given in a hospital or clinic and will not be stored at home. NOTE: This sheet is a summary. It may not cover all possible information. If you have questions about this medicine, talk to your doctor, pharmacist, or health care provider.  2023 Elsevier/Gold Standard (2021-10-27 00:00:00)

## 2022-05-10 NOTE — Telephone Encounter (Signed)
Patient left VM on answering service stating that she was returning your call.   Thanks!

## 2022-05-11 ENCOUNTER — Inpatient Hospital Stay: Payer: Medicare PPO

## 2022-05-11 DIAGNOSIS — Z510 Encounter for antineoplastic radiation therapy: Secondary | ICD-10-CM | POA: Diagnosis not present

## 2022-05-11 DIAGNOSIS — C8331 Diffuse large B-cell lymphoma, lymph nodes of head, face, and neck: Secondary | ICD-10-CM

## 2022-05-11 MED ORDER — PEGFILGRASTIM-CBQV 6 MG/0.6ML ~~LOC~~ SOSY
6.0000 mg | PREFILLED_SYRINGE | Freq: Once | SUBCUTANEOUS | Status: AC
  Administered 2022-05-11: 6 mg via SUBCUTANEOUS
  Filled 2022-05-11: qty 0.6

## 2022-05-14 ENCOUNTER — Telehealth: Payer: Self-pay | Admitting: *Deleted

## 2022-05-14 NOTE — Telephone Encounter (Signed)
Patient called answering services and left message reporting that her bowels have not been moving to her satisfaction. I returned her call and she stated that she finally was able to go well and that she is using Clear lax over the counter and asked what else she should be doing. I advised that if needed, she can take it twice a day or take it and add a senna tablet daily. She thanked me for returning her call

## 2022-05-15 ENCOUNTER — Inpatient Hospital Stay: Payer: Medicare PPO

## 2022-05-15 ENCOUNTER — Encounter: Payer: Self-pay | Admitting: Nurse Practitioner

## 2022-05-15 ENCOUNTER — Inpatient Hospital Stay (HOSPITAL_BASED_OUTPATIENT_CLINIC_OR_DEPARTMENT_OTHER): Payer: Medicare PPO | Admitting: Nurse Practitioner

## 2022-05-15 VITALS — BP 111/74 | HR 92 | Temp 98.7°F | Resp 19 | Wt 190.8 lb

## 2022-05-15 DIAGNOSIS — C8331 Diffuse large B-cell lymphoma, lymph nodes of head, face, and neck: Secondary | ICD-10-CM | POA: Diagnosis not present

## 2022-05-15 DIAGNOSIS — Z510 Encounter for antineoplastic radiation therapy: Secondary | ICD-10-CM | POA: Diagnosis not present

## 2022-05-15 DIAGNOSIS — K5900 Constipation, unspecified: Secondary | ICD-10-CM | POA: Diagnosis not present

## 2022-05-15 DIAGNOSIS — E860 Dehydration: Secondary | ICD-10-CM

## 2022-05-15 LAB — CBC WITH DIFFERENTIAL/PLATELET
Abs Immature Granulocytes: 0.1 10*3/uL — ABNORMAL HIGH (ref 0.00–0.07)
Band Neutrophils: 0 %
Basophils Absolute: 0.1 10*3/uL (ref 0.0–0.1)
Basophils Relative: 4 %
Blasts: 0 %
Eosinophils Absolute: 0.2 10*3/uL (ref 0.0–0.5)
Eosinophils Relative: 5 %
HCT: 36.3 % (ref 36.0–46.0)
Hemoglobin: 12.3 g/dL (ref 12.0–15.0)
Lymphocytes Relative: 16 %
Lymphs Abs: 0.5 10*3/uL — ABNORMAL LOW (ref 0.7–4.0)
MCH: 31.5 pg (ref 26.0–34.0)
MCHC: 33.9 g/dL (ref 30.0–36.0)
MCV: 92.8 fL (ref 80.0–100.0)
Metamyelocytes Relative: 0 %
Monocytes Absolute: 0.4 10*3/uL (ref 0.1–1.0)
Monocytes Relative: 12 %
Myelocytes: 0 %
Neutro Abs: 2 10*3/uL (ref 1.7–7.7)
Neutrophils Relative %: 60 %
Other: 0 %
Platelets: 168 10*3/uL (ref 150–400)
Promyelocytes Relative: 3 %
RBC: 3.91 MIL/uL (ref 3.87–5.11)
RDW: 13.5 % (ref 11.5–15.5)
Smear Review: ADEQUATE
WBC: 3.3 10*3/uL — ABNORMAL LOW (ref 4.0–10.5)
nRBC: 0 % (ref 0.0–0.2)
nRBC: 0 /100 WBC

## 2022-05-15 LAB — COMPREHENSIVE METABOLIC PANEL
ALT: 57 U/L — ABNORMAL HIGH (ref 0–44)
AST: 42 U/L — ABNORMAL HIGH (ref 15–41)
Albumin: 3.5 g/dL (ref 3.5–5.0)
Alkaline Phosphatase: 142 U/L — ABNORMAL HIGH (ref 38–126)
Anion gap: 6 (ref 5–15)
BUN: 15 mg/dL (ref 8–23)
CO2: 28 mmol/L (ref 22–32)
Calcium: 8.6 mg/dL — ABNORMAL LOW (ref 8.9–10.3)
Chloride: 101 mmol/L (ref 98–111)
Creatinine, Ser: 0.73 mg/dL (ref 0.44–1.00)
GFR, Estimated: >60 mL/min (ref >60)
Glucose, Bld: 108 mg/dL — ABNORMAL HIGH (ref 70–99)
Potassium: 4.2 mmol/L (ref 3.5–5.1)
Sodium: 135 mmol/L (ref 135–145)
Total Bilirubin: 1.1 mg/dL (ref 0.3–1.2)
Total Protein: 7.1 g/dL (ref 6.5–8.1)

## 2022-05-15 MED ORDER — SODIUM CHLORIDE 0.9 % IV SOLN
Freq: Once | INTRAVENOUS | Status: AC
  Filled 2022-05-15: qty 250

## 2022-05-15 MED ORDER — SODIUM CHLORIDE 0.9% FLUSH
10.0000 mL | Freq: Once | INTRAVENOUS | Status: AC
  Administered 2022-05-15: 10 mL via INTRAVENOUS
  Filled 2022-05-15: qty 10

## 2022-05-15 MED ORDER — HEPARIN SOD (PORK) LOCK FLUSH 100 UNIT/ML IV SOLN
500.0000 [IU] | Freq: Once | INTRAVENOUS | Status: AC
  Administered 2022-05-15: 500 [IU] via INTRAVENOUS
  Filled 2022-05-15: qty 5

## 2022-05-15 NOTE — Progress Notes (Signed)
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 Biopsy is suggestive of fatty liver changes  # carcinoma of lung status post left upper lobe resection in July of 2014; Adenocarcinoma T1N0 M0 tumor EGFR positive  SURGICAL PATHOLOGY  CASE: [id number]  PATIENT: Julie Jennings  Surgical Pathology Report      Specimen Submitted:  A. Lymph node, left neck   Clinical History: New left cervical lymphadenopathy.  History of  lymphoma and lung cancer.  New lymphadenopathy    MAY 17th, 2023- [Dr.juengle]; A. LYMPH NODE, LEFT NECK; ULTRASOUND-GUIDED BIOPSY:  - FINDINGS COMPATIBLE WITH LARGE B-CELL LYMPHOMA.   Comment:  Core biopsy sections display lymphoid tissue with a somewhat effaced  immunoarchitecture.  Portions of nodal tissue are comprised of sheets of  small lymphocytes, while other areas display dense background fibrosis,  and aggregates of large abnormal lymphocytes with irregular nuclear  contours, open chromatin, visible nucleoli, and retraction artifact.   Immunohistochemical studies demonstrate diffuse positivity for CD20  within regions comprised of larger lymphocytes, compatible with B cells.  CD3 highlights a background of small T cells.  CK AE1/AE3 is negative  for metastatic carcinoma.  PAX5 displays dim, patchy marking of larger  abnormal B cells.  In addition, these B cells appear to display dim  expression of CD10, with  positivity for Bcl-2, BCL6, and Mum-1.  B cells  are negative for CD5.  CD30 displays increased marking.  C-Myc is  positive, staining greater than 40% of larger cells.  CD45 (LCA) is  diffusely positive, without aberrant loss of expression.   Concurrent flow cytometric studies demonstrate a CD10 positive  monoclonal B-cell population in a background of many polytypic B cells,  representing 3% of total viable lymphoid cells and 8% of B cells.  For  further details, see scanned report in [facility].   The patient's history is of diffuse large B-cell lymphoma, as well as  invasive adenocarcinoma of the lung are noted.  Biopsy sections  demonstrate an abnormal proliferation of large B cells, compatible with  involvement by a diffuse large B-cell lymphoma. Further  subclassification is difficult, secondary to limited tissue, however,  presence of C10 marking would suggest a germinal center immunophenotype.  In addition, there does appear to be expression of both Bcl-2 and c-myc,  which may suggest a more aggressive process. There is no evidence of  metastatic adenocarcinoma. FISH testing for prognostically significant  abnormalities of BCL2, BCL6, and MYC will be attempted, and reported as  an addendum.   IMPRESSION: 1. Enlarged hypermetabolic lymph nodes in the bilateral left greater than right neck and asymmetric right palatine tonsil hypermetabolism with associated soft tissue fullness on the CT images, all new since 2014 PET-CT, most compatible with recurrent lymphoma. Deauville category 5.  # MAY 12th, 2023- DLBCL- STAGE II [NO Bone marrow]; GCB; double expresser-mcy; Bcl-2; QNS-FISH.   # MAY 30th, 2023- RCEOP q 3 W x3-RT   Cancer of upper lobe of left lung (HCC)  Diffuse  large B-cell lymphoma of lymph nodes of neck ([place])  04/25/2022 Initial Diagnosis   Diffuse large B-cell lymphoma of lymph nodes of neck ([place])   04/26/2022 Cancer Staging   Staging form: Hodgkin and Non-Hodgkin  Lymphoma, AJCC 8th Edition - Clinical: Stage II - Signed by Cammie Sickle, MD on 04/26/2022 Histopathologic type: Malignant lymphoma, large B-cell, diffuse, NOS   05/08/2022 -  Chemotherapy   [name]       INTERVAL HISTORY: Patient is accompanied by her cousin [name].  She is ambulating independently.  Julie Jennings 71 y.o.  female pleasant patient with DLBCL (prior hx of DLBCL in 2005) now s/p cycle 1 of R-CEOP with udenyca support who returns to clinic for follow up and treatment assessment.   She has lost weight. Reports constipation unrelieved by miralax. Started senna.   Review of Systems  Constitutional:  Negative for chills, diaphoresis, fever, malaise/fatigue and weight loss.  HENT:  Negative for nosebleeds and sore throat.   Eyes:  Negative for double vision.  Respiratory:  Negative for cough, hemoptysis, sputum production, shortness of breath and wheezing.   Cardiovascular:  Negative for chest pain, palpitations, orthopnea and leg swelling.  Gastrointestinal:  Positive for constipation. Negative for abdominal pain, blood in stool, diarrhea, heartburn, melena, nausea and vomiting.  Genitourinary:  Negative for dysuria, frequency and urgency.  Musculoskeletal:  Positive for joint pain. Negative for back pain and falls.  Skin: Negative.  Negative for itching and rash.  Neurological:  Negative for dizziness, tingling, focal weakness, weakness and headaches.  Endo/Heme/Allergies:  Does not bruise/bleed easily.  Psychiatric/Behavioral:  Negative for depression. The patient is nervous/anxious. The patient does not have insomnia.      PAST MEDICAL HISTORY :  Past Medical History:  Diagnosis Date   A-fib ([place])    Only once   Anxiety    Arthritis    oesteoarthritis   BP (high blood pressure) 04/16/2014   Chronic kidney disease    history nephrolithiasis   Depression    Dysrhythmia    PSVT    Endometriosis    Herpes zoster    History of kidney stones    Lung cancer ([place]) left   Lymphoma ([place])    "stomach"   Stroke ([place])     PAST SURGICAL HISTORY :   Past Surgical History:  Procedure Laterality Date   AUGMENTATION MAMMAPLASTY Bilateral    CHOLECYSTECTOMY     COLONOSCOPY     COLONOSCOPY WITH PROPOFOL N/A 04/14/2018   Procedure: COLONOSCOPY WITH PROPOFOL;  Surgeon: Manya Silvas, MD;  Location: [facility] ENDOSCOPY;  Service: Endoscopy;  Laterality: N/A;   DILATION AND CURETTAGE OF UTERUS     HEMORRHOIDECTOMY WITH HEMORRHOID BANDING     HERNIA REPAIR     umbilical hernia   IR IMAGING GUIDED PORT INSERTION  05/04/2022   LUNG REMOVAL, PARTIAL Left    REVERSE SHOULDER ARTHROPLASTY Left 01/04/2022   Procedure: Left reverse shoulder arthroplasty, biceps tenodesis;  Surgeon: Leim Fabry, MD;  Location: [facility] ORS;  Service: Orthopedics;  Laterality: Left;    FAMILY HISTORY :   Family History  Problem Relation Age of Onset   Stroke Mother    Diabetes Mother    Colon cancer Father    Prostate cancer Father    Breast cancer Neg Hx     SOCIAL HISTORY:   Social History   Tobacco Use   Smoking status: Never   Smokeless  tobacco: Never  Vaping Use   Vaping Use: Never used  Substance Use Topics   Alcohol use: No   Drug use: No    ALLERGIES:  is allergic to tylenol [acetaminophen], buspirone, and codeine.  MEDICATIONS:  Current Outpatient Medications  Medication Sig Dispense Refill   acyclovir (ZOVIRAX) 400 MG tablet Take 1 tablet (400 mg total) by mouth 2 (two) times daily. 60 tablet 4   ALPRAZolam (XANAX) 0.5 MG tablet Take 1 tablet (0.5 mg total) by mouth 3 (three) times daily as needed for anxiety. (Patient taking differently: Take 0.5 mg by mouth 3 (three) times daily.) 21 tablet 0   aspirin 81 MG chewable tablet Chew by mouth.     erythromycin ophthalmic ointment Place 1 application into the right eye daily as needed (after shot).     furosemide (LASIX) 40 MG  tablet Take 40 mg by mouth daily.     ibuprofen (ADVIL) 200 MG tablet Take 600 mg by mouth at bedtime as needed for mild pain or moderate pain.     ketoconazole (NIZORAL) 2 % cream Apply to the feet QHS 60 g 3   lidocaine-prilocaine (EMLA) cream Apply on the port. 30 -45 min  prior to port access. 30 g 3   losartan (COZAAR) 100 MG tablet Take 100 mg by mouth daily.     meloxicam (MOBIC) 15 MG tablet Take 15 mg by mouth daily.     nebivolol (BYSTOLIC) 10 MG tablet Take 10 mg by mouth daily.     ondansetron (ZOFRAN) 4 MG tablet Take 1 tablet (4 mg total) by mouth every 6 (six) hours as needed for nausea. (Patient not taking: Reported on 05/04/2022) 20 tablet 0   ondansetron (ZOFRAN) 8 MG tablet One pill every 8 hours as needed for nausea/vomitting. (Patient not taking: Reported on 05/04/2022) 40 tablet 1   oxyCODONE (OXY IR/ROXICODONE) 5 MG immediate release tablet Take 1 tablet (5 mg total) by mouth every 4 (four) hours as needed for moderate pain (pain score 4-6). 30 tablet 0   pantoprazole (PROTONIX) 40 MG tablet Take 40 mg by mouth 2 (two) times daily.     Polyethyl Glycol-Propyl Glycol (SYSTANE) 0.4-0.3 % SOLN Place 1 drop into both eyes daily as needed (Dry eye).     predniSONE (DELTASONE) 50 MG tablet Take 2 tablets once day x 5 days. START on day of your chemotherapy. Take with food. (Patient not taking: Reported on 05/04/2022) 10 tablet 4   prochlorperazine (COMPAZINE) 10 MG tablet Take 1 tablet (10 mg total) by mouth every 6 (six) hours as needed for nausea or vomiting. (Patient not taking: Reported on 05/04/2022) 40 tablet 1   tiotropium (SPIRIVA) 18 MCG inhalation capsule Place 18 mcg into inhaler and inhale daily.     triamcinolone (NASACORT) 55 MCG/ACT AERO nasal inhaler Place 2 sprays into the nose daily.     venlafaxine XR (EFFEXOR-XR) 75 MG 24 hr capsule Take 75 mg by mouth 2 (two) times daily.     No current facility-administered medications for this visit.    PHYSICAL  EXAMINATION: ECOG PERFORMANCE STATUS: 0 - Asymptomatic  BP 111/74   Pulse 92   Temp 98.7 F (37.1 C)   Resp 19   Wt 190 lb 12.8 oz (86.5 kg)   SpO2 98%   BMI 29.88 kg/m   Filed Weights   05/15/22 1317  Weight: 190 lb 12.8 oz (86.5 kg)   Physical Exam Constitutional:      Appearance: She is  not ill-appearing.  Eyes:     General: No scleral icterus.    Conjunctiva/sclera: Conjunctivae normal.  Neck:     Comments: Left neck lymphadenopathy 2 to 3 cm in size noted.  Nontender. Cardiovascular:     Rate and Rhythm: Normal rate and regular rhythm.  Pulmonary:     Effort: Pulmonary effort is normal. No respiratory distress.  Abdominal:     General: There is no distension.     Palpations: Abdomen is soft.     Tenderness: There is no abdominal tenderness. There is no guarding.  Musculoskeletal:        General: No deformity.     Right lower leg: No edema.     Left lower leg: No edema.  Skin:    General: Skin is warm and dry.  Neurological:     Mental Status: She is alert and oriented to person, place, and time. Mental status is at baseline.  Psychiatric:        Mood and Affect: Mood normal.        Behavior: Behavior normal.      LABORATORY DATA:  I have reviewed the data as listed Lab Results  Component Value Date   WBC 7.5 05/08/2022   NEUTROABS 5.4 05/08/2022   HGB 12.4 05/08/2022   HCT 36.5 05/08/2022   MCV 91.3 05/08/2022   PLT 196 05/08/2022      Latest Ref Rng & Units 05/15/2022    1:06 PM 05/08/2022    8:13 AM 05/04/2022   10:38 AM  CMP  Glucose 70 - 99 mg/dL 108  124  101   BUN 8 - 23 mg/dL '15  18  17   ' Creatinine 0.44 - 1.00 mg/dL 0.73  0.65  0.63   Sodium 135 - 145 mmol/L 135  137  138   Potassium 3.5 - 5.1 mmol/L 4.2  3.4  4.6   Chloride 98 - 111 mmol/L 101  102  102   CO2 22 - 32 mmol/L '28  27  30   ' Calcium 8.9 - 10.3 mg/dL 8.6  8.8  9.1   Total Protein 6.5 - 8.1 g/dL 7.1  7.0  7.7   Total Bilirubin 0.3 - 1.2 mg/dL 1.1  0.8  0.8   Alkaline Phos  38 - 126 U/L 142  143  142   AST 15 - 41 U/L 42  56  52   ALT 0 - 44 U/L 57  47  44     RADIOGRAPHIC STUDIES: I have personally reviewed the radiological images as listed and agreed with the findings in the report. No results found.   ASSESSMENT & PLAN:  No problem-specific Assessment & Plan notes found for this encounter.  # Diffuse B-cell lymphoma- bilateral neck (left > right). Clinically stage II. Bone marrow held. S/p cycle 1 of R-CEOP chemotherapy with plan for 3 cycles followed by involved field radiation. She tolerated cycle 1 well without significant side effects. She will proceed with IV fluids today.   # Cardiac monitoring- normal left EF 64% with normal LV wall motion.   # Abnormal LFTs- possible fatty liver. Hepatitis panel was normal  # infection risk- continue acyclovir prophylaxis. Reviewed infection precautions  # Constipation. Encouraged her to increase mirlax to 1-3 times a day, senna 1-2 tablets 1-3 times a day as needed. Encouraged fluid, fiber, and movement as tolerated.    DISPOSITION: Fluids today Follow up with Dr. Rogue Bussing as scheduled.   No orders of the defined types  were placed in this encounter.  All questions were answered. The patient knows to call the clinic with any problems, questions or concerns.     Verlon Au, NP 05/15/2022

## 2022-05-16 ENCOUNTER — Telehealth: Payer: Self-pay | Admitting: *Deleted

## 2022-05-16 NOTE — Telephone Encounter (Signed)
she has eye appt on Thursday- she usually gets her eye dilated and then the MD decides based on the swelling she may need injection of Eylea. is that ok with her being on chemo. I sent a message to Dr B and this what he said: Recommend hold off any invasive injections while on chemo- as she might be immune compromised, unless this is an urgent or indespensible. I have texted pt and let her know what dr B. said

## 2022-05-22 ENCOUNTER — Encounter: Payer: Self-pay | Admitting: Internal Medicine

## 2022-05-23 ENCOUNTER — Other Ambulatory Visit: Payer: Self-pay | Admitting: Dermatology

## 2022-05-23 DIAGNOSIS — B353 Tinea pedis: Secondary | ICD-10-CM

## 2022-05-24 ENCOUNTER — Other Ambulatory Visit: Payer: Self-pay | Admitting: Dermatology

## 2022-05-24 DIAGNOSIS — B353 Tinea pedis: Secondary | ICD-10-CM

## 2022-05-24 IMAGING — DX DG SHOULDER 1V*L*
2 series · 2 of 2 positions shown · non-contrast
Comparison: Preoperative imaging.

CLINICAL DATA: Status post shoulder replacement.

EXAM:
LEFT SHOULDER

[shoulder ap (1 of 2)]
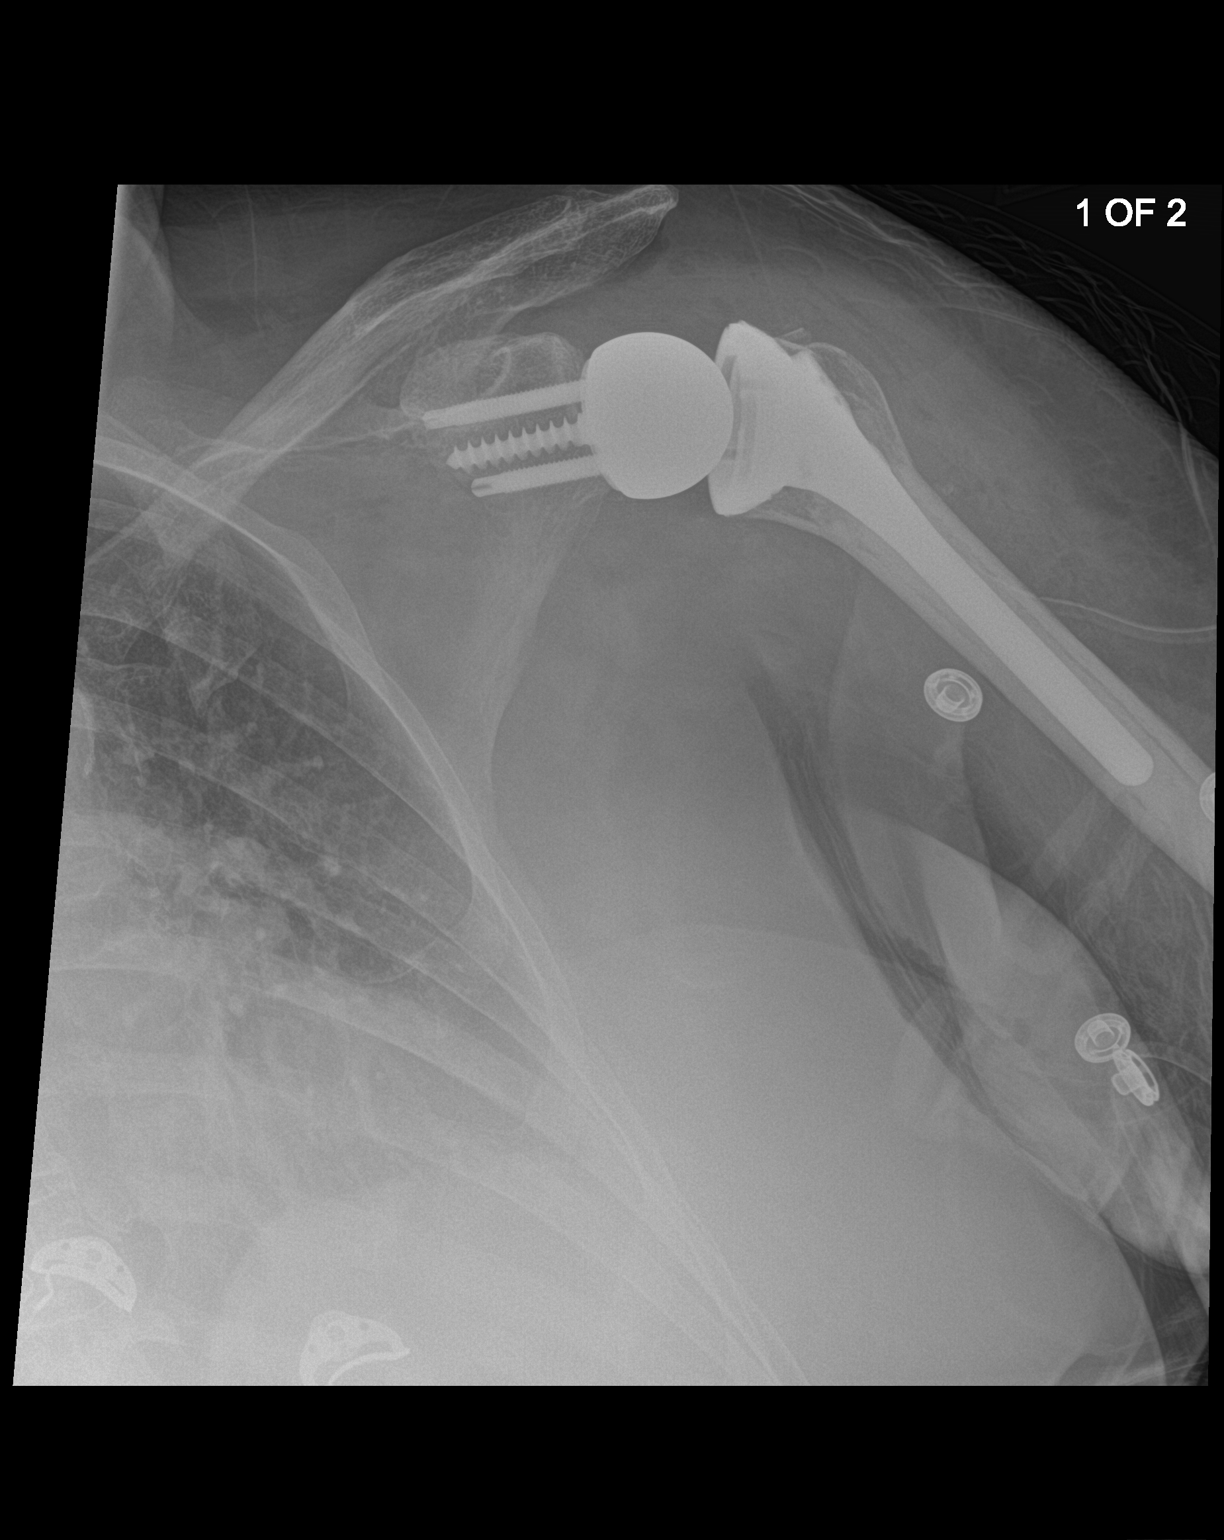

[shoulder ap (2 of 2)]
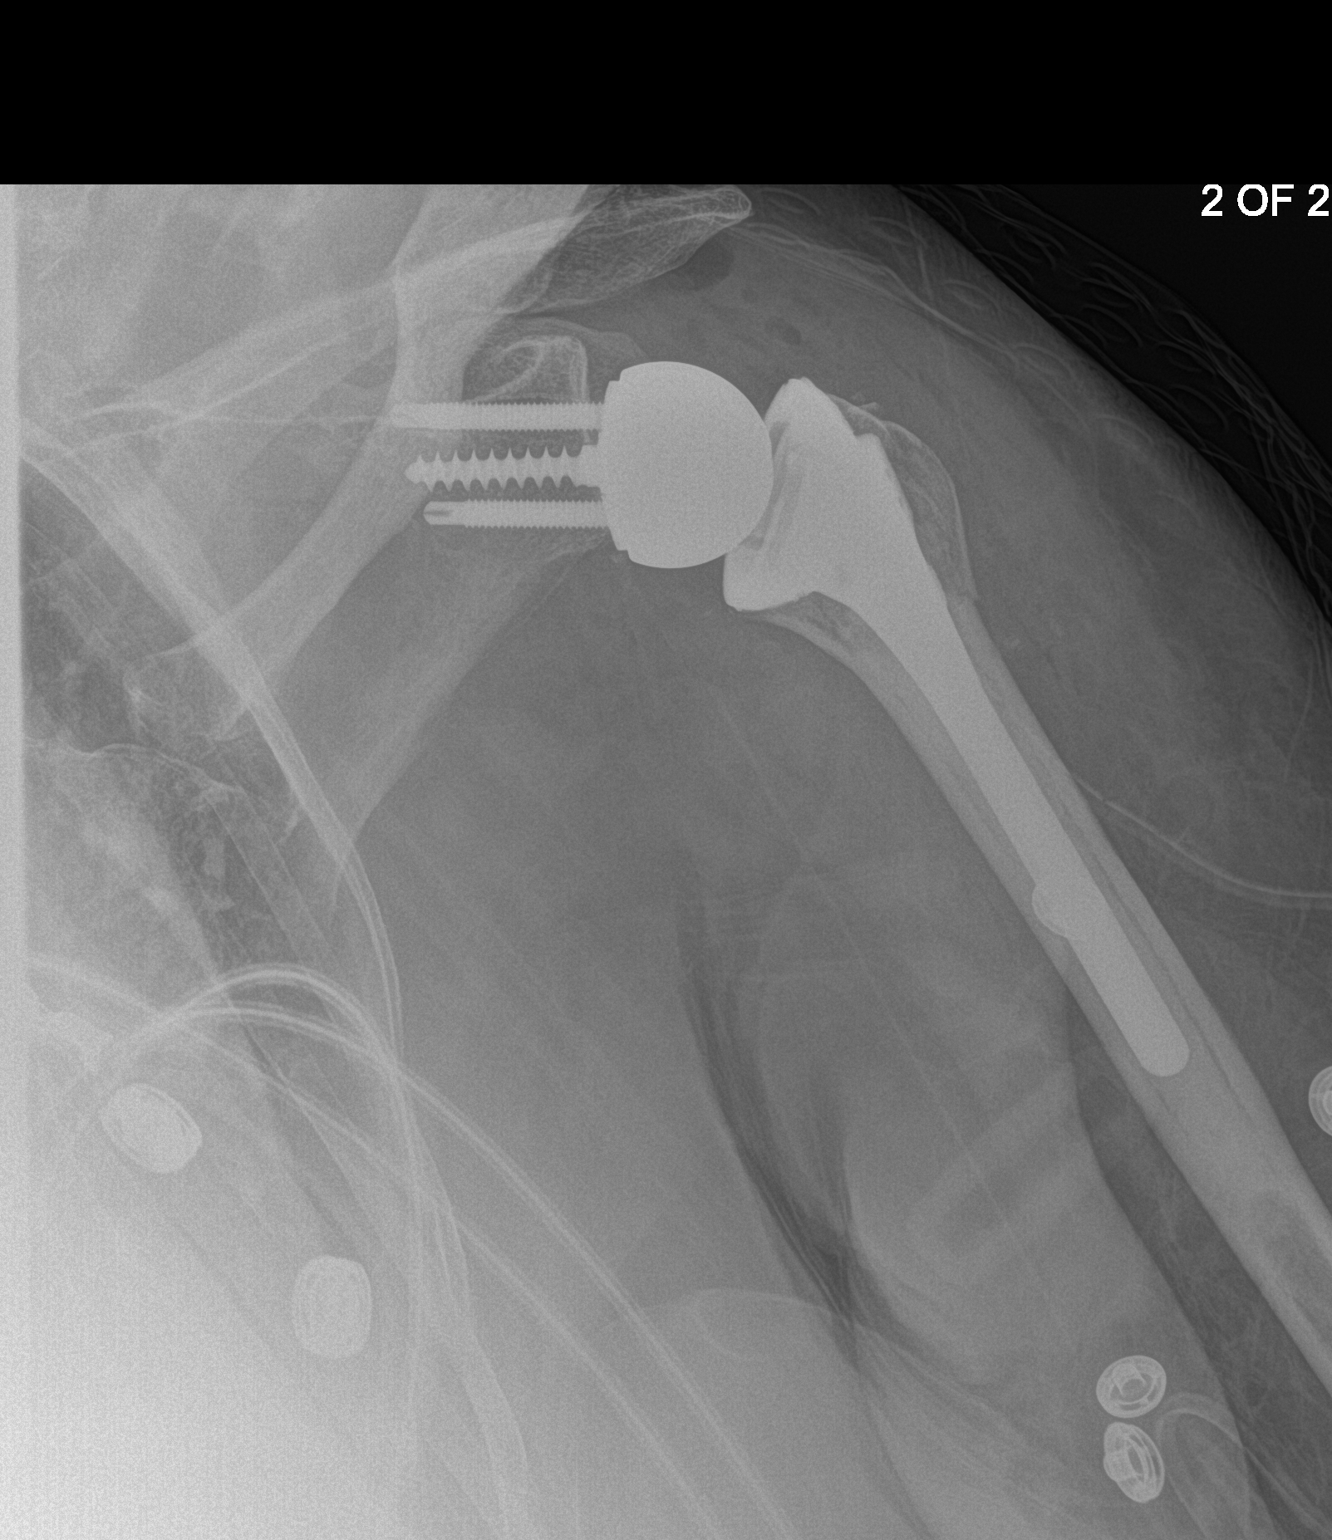

[2 of 2 positions shown; findings below may reference images not displayed]

FINDINGS: Reverse left shoulder arthroplasty in expected alignment. No
periprosthetic lucency. Recent postsurgical change includes air and
edema in the joint space and soft tissues.
IMPRESSION: Reverse left shoulder arthroplasty in expected alignment.

## 2022-05-25 ENCOUNTER — Other Ambulatory Visit: Payer: Self-pay

## 2022-05-25 DIAGNOSIS — C8331 Diffuse large B-cell lymphoma, lymph nodes of head, face, and neck: Secondary | ICD-10-CM

## 2022-05-25 DIAGNOSIS — B353 Tinea pedis: Secondary | ICD-10-CM

## 2022-05-25 MED ORDER — KETOCONAZOLE 2 % EX CREA: TOPICAL_CREAM | CUTANEOUS | 3 refills | Status: DC

## 2022-05-25 NOTE — Telephone Encounter (Signed)
Patient is requesting a refill of the Ketoconazole cream that was originally prescribed by dermatology for toe itching/redness.  Dermatology denied refill because it has been a year since seen in their office.  She would have to be seen by dermatology before they refill but no available appts in the next few months.

## 2022-05-28 ENCOUNTER — Inpatient Hospital Stay (HOSPITAL_BASED_OUTPATIENT_CLINIC_OR_DEPARTMENT_OTHER): Payer: Medicare PPO | Admitting: Internal Medicine

## 2022-05-28 ENCOUNTER — Encounter: Payer: Self-pay | Admitting: Internal Medicine

## 2022-05-28 ENCOUNTER — Inpatient Hospital Stay: Payer: Medicare PPO

## 2022-05-28 ENCOUNTER — Other Ambulatory Visit: Payer: Self-pay

## 2022-05-28 DIAGNOSIS — Z510 Encounter for antineoplastic radiation therapy: Secondary | ICD-10-CM | POA: Diagnosis not present

## 2022-05-28 DIAGNOSIS — C8331 Diffuse large B-cell lymphoma, lymph nodes of head, face, and neck: Secondary | ICD-10-CM

## 2022-05-28 LAB — CBC WITH DIFFERENTIAL/PLATELET
Abs Immature Granulocytes: 0.15 10*3/uL — ABNORMAL HIGH (ref 0.00–0.07)
Basophils Absolute: 0.1 10*3/uL (ref 0.0–0.1)
Basophils Relative: 1 %
Eosinophils Absolute: 0.1 10*3/uL (ref 0.0–0.5)
Eosinophils Relative: 1 %
HCT: 37.1 % (ref 36.0–46.0)
Hemoglobin: 12.4 g/dL (ref 12.0–15.0)
Immature Granulocytes: 2 %
Lymphocytes Relative: 8 %
Lymphs Abs: 0.8 10*3/uL (ref 0.7–4.0)
MCH: 31.6 pg (ref 26.0–34.0)
MCHC: 33.4 g/dL (ref 30.0–36.0)
MCV: 94.6 fL (ref 80.0–100.0)
Monocytes Absolute: 0.7 10*3/uL (ref 0.1–1.0)
Monocytes Relative: 7 %
Neutro Abs: 8 10*3/uL — ABNORMAL HIGH (ref 1.7–7.7)
Neutrophils Relative %: 81 %
Platelets: 206 10*3/uL (ref 150–400)
RBC: 3.92 MIL/uL (ref 3.87–5.11)
RDW: 15.2 % (ref 11.5–15.5)
WBC: 9.8 10*3/uL (ref 4.0–10.5)
nRBC: 0 % (ref 0.0–0.2)

## 2022-05-28 LAB — COMPREHENSIVE METABOLIC PANEL
ALT: 45 U/L — ABNORMAL HIGH (ref 0–44)
AST: 58 U/L — ABNORMAL HIGH (ref 15–41)
Albumin: 3.7 g/dL (ref 3.5–5.0)
Alkaline Phosphatase: 152 U/L — ABNORMAL HIGH (ref 38–126)
Anion gap: 10 (ref 5–15)
BUN: 17 mg/dL (ref 8–23)
CO2: 28 mmol/L (ref 22–32)
Calcium: 9.2 mg/dL (ref 8.9–10.3)
Chloride: 102 mmol/L (ref 98–111)
Creatinine, Ser: 0.85 mg/dL (ref 0.44–1.00)
GFR, Estimated: >60 mL/min (ref >60)
Glucose, Bld: 150 mg/dL — ABNORMAL HIGH (ref 70–99)
Potassium: 4.3 mmol/L (ref 3.5–5.1)
Sodium: 140 mmol/L (ref 135–145)
Total Bilirubin: 0.5 mg/dL (ref 0.3–1.2)
Total Protein: 7.5 g/dL (ref 6.5–8.1)

## 2022-05-28 LAB — LACTATE DEHYDROGENASE: LDH: 184 U/L (ref 98–192)

## 2022-05-28 MED FILL — Dexamethasone Sodium Phosphate Inj 100 MG/10ML: INTRAMUSCULAR | Qty: 1 | Status: AC

## 2022-05-28 NOTE — Assessment & Plan Note (Addendum)
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  Clinically stage II. Currently on R-CEOP chemotherapy every 3 weeks x 3 cycles-followed by involved field radiation.   # proceed with cycle #2 of R-CEOP Labs today reviewed;  acceptable for treatment today. MAY 2023- LEFT ventricular ejection fraction of 64% with normal LV wall motion.  Referral to Dr. Donella Stade radiation.  # Slightly intermittent elevated LFts-?  Fatty liver.  Hepatitis panel normal.  # prophylaxis Shingles: start Acyclovir prophylaxis   *D-1;D-2 chemo  # DISPOSITION: # refer to Dr.Chrystal re: DLBCL neck # Chemo as planned this week # in 10 days- labs- cbc/cmp  # follow up in 3 weeks MD- cbc/cmp/ldh D-1 RCEOP; D-2&3 - etoposide; D-4 Margart Sickles-   Dr.B

## 2022-05-28 NOTE — Progress Notes (Signed)
Pt wants you to be aware that she and Lanetta Inch RN spoke about her not having nausea and vomiting but she is taking her rx's as precautionary.

## 2022-05-28 NOTE — Progress Notes (Signed)
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 Biopsy is suggestive of fatty liver changes  # carcinoma of lung status post left upper lobe resection in July of 2014; Adenocarcinoma T1N0 M0 tumor EGFR positive  SURGICAL PATHOLOGY  CASE: [id number]  PATIENT: Julie Jennings  Surgical Pathology Report      Specimen Submitted:  A. Lymph node, left neck   Clinical History: New left cervical lymphadenopathy.  History of  lymphoma and lung cancer.  New lymphadenopathy    MAY 17th, 2023- [Dr.juengle]; A. LYMPH NODE, LEFT NECK; ULTRASOUND-GUIDED BIOPSY:  - FINDINGS COMPATIBLE WITH LARGE B-CELL LYMPHOMA.   Comment:  Core biopsy sections display lymphoid tissue with a somewhat effaced  immunoarchitecture.  Portions of nodal tissue are comprised of sheets of  small lymphocytes, while other areas display dense background fibrosis,  and aggregates of large abnormal lymphocytes with irregular nuclear  contours, open chromatin, visible nucleoli, and retraction artifact.   Immunohistochemical studies demonstrate diffuse positivity for CD20  within regions comprised of larger lymphocytes, compatible with B cells.  CD3 highlights a background of small T cells.  CK AE1/AE3 is negative  for metastatic carcinoma.  PAX5 displays dim, patchy marking of larger  abnormal B cells.  In addition, these B cells appear to display dim  expression of CD10, with  positivity for Bcl-2, BCL6, and Mum-1.  B cells  are negative for CD5.  CD30 displays increased marking.  C-Myc is  positive, staining greater than 40% of larger cells.  CD45 (LCA) is  diffusely positive, without aberrant loss of expression.   Concurrent flow cytometric studies demonstrate a CD10 positive  monoclonal B-cell population in a background of many polytypic B cells,  representing 3% of total viable lymphoid cells and 8% of B cells.  For  further details, see scanned report in [facility].   The patient's history is of diffuse large B-cell lymphoma, as well as  invasive adenocarcinoma of the lung are noted.  Biopsy sections  demonstrate an abnormal proliferation of large B cells, compatible with  involvement by a diffuse large B-cell lymphoma. Further  subclassification is difficult, secondary to limited tissue, however,  presence of C10 marking would suggest a germinal center immunophenotype.  In addition, there does appear to be expression of both Bcl-2 and c-myc,  which may suggest a more aggressive process. There is no evidence of  metastatic adenocarcinoma. FISH testing for prognostically significant  abnormalities of BCL2, BCL6, and MYC will be attempted, and reported as  an addendum.   IMPRESSION: 1. Enlarged hypermetabolic lymph nodes in the bilateral left greater than right neck and asymmetric right palatine tonsil hypermetabolism with associated soft tissue fullness on the CT images, all new since 2014 PET-CT, most compatible with recurrent lymphoma. Deauville category 5.  # MAY 12th, 2023- DLBCL- STAGE II [NO Bone marrow]; GCB; double expresser-mcy; Bcl-2; QNS-FISH.   # MAY 30th, 2023- RCEOP q 3 W x3-RT   Cancer of upper lobe of left lung ([place])  Diffuse large B-cell lymphoma of lymph nodes of neck ([place])  04/25/2022 Initial Diagnosis   Diffuse large B-cell lymphoma of lymph nodes of neck ([place])   04/26/2022 Cancer Staging   Staging form: Hodgkin and Non-Hodgkin  Lymphoma, AJCC 8th Edition - Clinical: Stage II - Signed by Cammie Sickle, MD on 04/26/2022 Histopathologic type: Malignant lymphoma, large B-cell, diffuse, NOS   05/08/2022 -  Chemotherapy   [name]       INTERVAL HISTORY: Patient is accompanied by her cousin Julie Jennings.  She is ambulating independently.  Julie Jennings 71 y.o.  female pleasant patient with extreme anxiety; and diffuse large B cell lymphoma [prior history of 2005 DLCBL]  is here to proceed chemotherapy.   Patient currently status post R-CEOP cycle #1 approximately 3 weeks ago.  Patient denies any worsening enlargement of her lymph nodes.  Notes to have improvement of the lymph nodes. Weight is stable.  No nausea no vomiting.  No new lumps or bumps.  No night sweats.  Review of Systems  Constitutional:  Negative for chills, diaphoresis, fever, malaise/fatigue and weight loss.  HENT:  Negative for nosebleeds and sore throat.   Eyes:  Negative for double vision.  Respiratory:  Negative for cough, hemoptysis, sputum production, shortness of breath and wheezing.   Cardiovascular:  Negative for chest pain, palpitations, orthopnea and leg swelling.  Gastrointestinal:  Negative for abdominal pain, blood in stool, constipation, diarrhea, heartburn, melena, nausea and vomiting.  Musculoskeletal:  Positive for joint pain. Negative for back pain.  Skin: Negative.  Negative for itching and rash.  Neurological:  Negative for dizziness, tingling, focal weakness, weakness and headaches.  Endo/Heme/Allergies:  Does not bruise/bleed easily.  Psychiatric/Behavioral:  Negative for depression. The patient is nervous/anxious. The patient does not have insomnia.      PAST MEDICAL HISTORY :  Past Medical History:  Diagnosis Date   A-fib ([place])    Only once   Anxiety    Arthritis    oesteoarthritis   BP (high blood pressure) 04/16/2014   Chronic kidney disease     history nephrolithiasis   Depression    Dysrhythmia    PSVT   Endometriosis    Herpes zoster    History of kidney stones    Lung cancer ([place]) left   Lymphoma ([place])    "stomach"   Stroke ([place])     PAST SURGICAL HISTORY :   Past Surgical History:  Procedure Laterality Date   AUGMENTATION MAMMAPLASTY Bilateral    CHOLECYSTECTOMY     COLONOSCOPY     COLONOSCOPY WITH PROPOFOL N/A 04/14/2018   Procedure: COLONOSCOPY WITH PROPOFOL;  Surgeon: Manya Silvas, MD;  Location: [facility] ENDOSCOPY;  Service: Endoscopy;  Laterality: N/A;   DILATION AND CURETTAGE OF UTERUS     HEMORRHOIDECTOMY WITH HEMORRHOID BANDING     HERNIA REPAIR     umbilical hernia   IR IMAGING GUIDED PORT INSERTION  05/04/2022   LUNG REMOVAL, PARTIAL Left    REVERSE SHOULDER ARTHROPLASTY Left 01/04/2022   Procedure: Left reverse shoulder arthroplasty, biceps tenodesis;  Surgeon: Leim Fabry, MD;  Location: [facility] ORS;  Service: Orthopedics;  Laterality: Left;    FAMILY HISTORY :   Family History  Problem Relation Age of Onset   Stroke Mother    Diabetes Mother    Colon cancer Father    Prostate cancer Father    Breast cancer Neg Hx     SOCIAL  HISTORY:   Social History   Tobacco Use   Smoking status: Never   Smokeless tobacco: Never  Vaping Use   Vaping Use: Never used  Substance Use Topics   Alcohol use: No   Drug use: No    ALLERGIES:  is allergic to tylenol [acetaminophen], buspirone, and codeine.  MEDICATIONS:  Current Outpatient Medications  Medication Sig Dispense Refill   acyclovir (ZOVIRAX) 400 MG tablet Take 1 tablet (400 mg total) by mouth 2 (two) times daily. 60 tablet 4   ALPRAZolam (XANAX) 0.5 MG tablet Take 1 tablet (0.5 mg total) by mouth 3 (three) times daily as needed for anxiety. (Patient taking differently: Take 0.5 mg by mouth 3 (three) times daily.) 21 tablet 0   aspirin 81 MG chewable tablet Chew by mouth.     erythromycin ophthalmic ointment Place 1 application into the right  eye daily as needed (after shot).     ibuprofen (ADVIL) 200 MG tablet Take 600 mg by mouth at bedtime as needed for mild pain or moderate pain.     ketoconazole (NIZORAL) 2 % cream Apply to the feet QHS 60 g 3   lidocaine-prilocaine (EMLA) cream Apply on the port. 30 -45 min  prior to port access. 30 g 3   losartan (COZAAR) 100 MG tablet Take 100 mg by mouth daily.     meloxicam (MOBIC) 15 MG tablet Take 15 mg by mouth daily.     nebivolol (BYSTOLIC) 10 MG tablet Take 10 mg by mouth daily.     ondansetron (ZOFRAN) 4 MG tablet Take 1 tablet (4 mg total) by mouth every 6 (six) hours as needed for nausea. 20 tablet 0   ondansetron (ZOFRAN) 8 MG tablet One pill every 8 hours as needed for nausea/vomitting. 40 tablet 1   oxyCODONE (OXY IR/ROXICODONE) 5 MG immediate release tablet Take 1 tablet (5 mg total) by mouth every 4 (four) hours as needed for moderate pain (pain score 4-6). 30 tablet 0   Polyethyl Glycol-Propyl Glycol (SYSTANE) 0.4-0.3 % SOLN Place 1 drop into both eyes daily as needed (Dry eye).     prochlorperazine (COMPAZINE) 10 MG tablet Take 1 tablet (10 mg total) by mouth every 6 (six) hours as needed for nausea or vomiting. 40 tablet 1   tiotropium (SPIRIVA) 18 MCG inhalation capsule Place 18 mcg into inhaler and inhale daily.     triamcinolone (NASACORT) 55 MCG/ACT AERO nasal inhaler Place 2 sprays into the nose daily.     venlafaxine XR (EFFEXOR-XR) 75 MG 24 hr capsule Take 75 mg by mouth 2 (two) times daily.     furosemide (LASIX) 40 MG tablet Take 40 mg by mouth daily.     pantoprazole (PROTONIX) 40 MG tablet Take 40 mg by mouth 2 (two) times daily.     No current facility-administered medications for this visit.    PHYSICAL EXAMINATION: ECOG PERFORMANCE STATUS: 0 - Asymptomatic  BP 116/82 (BP Location: Right Arm, Patient Position: Sitting, Cuff Size: Normal)   Pulse 82   Temp 97.6 F (36.4 C) (Tympanic)   Ht '5\' 7"'  (1.702 m)   Wt 192 lb 6.4 oz (87.3 kg)   SpO2 97%   BMI  30.13 kg/m   Filed Weights   05/28/22 1521  Weight: 192 lb 6.4 oz (87.3 kg)    Left neck lymphadenopathy 2 to 3 cm in size noted.  Nontender.  Physical Exam HENT:     Head: Normocephalic and atraumatic.     Mouth/Throat:  Pharynx: No oropharyngeal exudate.  Eyes:     Pupils: Pupils are equal, round, and reactive to light.  Cardiovascular:     Rate and Rhythm: Normal rate and regular rhythm.  Pulmonary:     Effort: Pulmonary effort is normal. No respiratory distress.     Breath sounds: Normal breath sounds. No wheezing.  Abdominal:     General: Bowel sounds are normal. There is no distension.     Palpations: Abdomen is soft. There is no mass.     Tenderness: There is no abdominal tenderness. There is no guarding or rebound.  Musculoskeletal:        General: No tenderness. Normal range of motion.     Cervical back: Normal range of motion and neck supple.  Skin:    General: Skin is warm.  Neurological:     Mental Status: She is alert and oriented to person, place, and time.  Psychiatric:        Mood and Affect: Affect normal.      LABORATORY DATA:  I have reviewed the data as listed    Component Value Date/Time   NA 140 05/28/2022 1514   NA 139 03/14/2015 0856   K 4.3 05/28/2022 1514   K 4.1 03/14/2015 0856   CL 102 05/28/2022 1514   CL 103 03/14/2015 0856   CO2 28 05/28/2022 1514   CO2 28 03/14/2015 0856   GLUCOSE 150 (H) 05/28/2022 1514   GLUCOSE 111 (H) 03/14/2015 0856   BUN 17 05/28/2022 1514   BUN 19 03/14/2015 0856   CREATININE 0.85 05/28/2022 1514   CREATININE 0.56 03/14/2015 0856   CALCIUM 9.2 05/28/2022 1514   CALCIUM 9.3 03/14/2015 0856   PROT 7.5 05/28/2022 1514   PROT 7.7 03/14/2015 0856   ALBUMIN 3.7 05/28/2022 1514   ALBUMIN 4.3 03/14/2015 0856   AST 58 (H) 05/28/2022 1514   AST 80 (H) 03/14/2015 0856   ALT 45 (H) 05/28/2022 1514   ALT 87 (H) 03/14/2015 0856   ALKPHOS 152 (H) 05/28/2022 1514   ALKPHOS 164 (H) 03/14/2015 0856   BILITOT  0.5 05/28/2022 1514   BILITOT 0.8 03/14/2015 0856   GFRNONAA >60 05/28/2022 1514   GFRNONAA >60 03/14/2015 0856   GFRAA >60 07/07/2020 1041   GFRAA >60 03/14/2015 0856    No results found for: "SPEP", "UPEP"  Lab Results  Component Value Date   WBC 9.8 05/28/2022   NEUTROABS 8.0 (H) 05/28/2022   HGB 12.4 05/28/2022   HCT 37.1 05/28/2022   MCV 94.6 05/28/2022   PLT 206 05/28/2022      Chemistry      Component Value Date/Time   NA 140 05/28/2022 1514   NA 139 03/14/2015 0856   K 4.3 05/28/2022 1514   K 4.1 03/14/2015 0856   CL 102 05/28/2022 1514   CL 103 03/14/2015 0856   CO2 28 05/28/2022 1514   CO2 28 03/14/2015 0856   BUN 17 05/28/2022 1514   BUN 19 03/14/2015 0856   CREATININE 0.85 05/28/2022 1514   CREATININE 0.56 03/14/2015 0856      Component Value Date/Time   CALCIUM 9.2 05/28/2022 1514   CALCIUM 9.3 03/14/2015 0856   ALKPHOS 152 (H) 05/28/2022 1514   ALKPHOS 164 (H) 03/14/2015 0856   AST 58 (H) 05/28/2022 1514   AST 80 (H) 03/14/2015 0856   ALT 45 (H) 05/28/2022 1514   ALT 87 (H) 03/14/2015 0856   BILITOT 0.5 05/28/2022 1514   BILITOT 0.8 03/14/2015 0856  RADIOGRAPHIC STUDIES: I have personally reviewed the radiological images as listed and agreed with the findings in the report. No results found.   ASSESSMENT & PLAN:  Diffuse large B-cell lymphoma of lymph nodes of neck ([facility]) #Diffuse B-cell lymphoma-bilateral neck left more than right PET scan.  Clinically stage II. Currently on R-CEOP chemotherapy every 3 weeks x 3 cycles-followed by involved field radiation.   # proceed with cycle #2 of R-CEOP Labs today reviewed;  acceptable for treatment today. MAY 2023- LEFT ventricular ejection fraction of 64% with normal LV wall motion.  Referral to Dr. Donella Stade radiation.  # Slightly intermittent elevated LFts-?  Fatty liver.  Hepatitis panel normal.  # prophylaxis Shingles: start Acyclovir prophylaxis   *D-1;D-2 chemo  # DISPOSITION: # refer  to Dr.Chrystal re: DLBCL neck # Chemo as planned this week # in 10 days- labs- cbc/cmp  # follow up in 3 weeks MD- cbc/cmp/ldh D-1 RCEOP; D-2&3 - etoposide; D-4 Margart Sickles-   Dr.B   Orders Placed This Encounter  Procedures   CBC with Differential/Platelet    Standing Status:   Future    Standing Expiration Date:   05/29/2023   Comprehensive metabolic panel    Standing Status:   Future    Standing Expiration Date:   05/29/2023   CBC with Differential/Platelet    Standing Status:   Future    Standing Expiration Date:   05/29/2023   Comprehensive metabolic panel    Standing Status:   Future    Standing Expiration Date:   05/29/2023   Lactate dehydrogenase    Standing Status:   Future    Standing Expiration Date:   05/29/2023   Ambulatory referral to Radiation Oncology    Referral Priority:   Routine    Referral Type:   Consultation    Referral Reason:   Specialty Services Required    Requested Specialty:   Radiation Oncology    Number of Visits Requested:   1   All questions were answered. The patient knows to call the clinic with any problems, questions or concerns.      Cammie Sickle, MD 05/28/2022 6:44 PM

## 2022-05-29 ENCOUNTER — Other Ambulatory Visit: Payer: Self-pay | Admitting: Internal Medicine

## 2022-05-29 ENCOUNTER — Ambulatory Visit: Payer: Medicare PPO | Admitting: Internal Medicine

## 2022-05-29 ENCOUNTER — Other Ambulatory Visit: Payer: Medicare PPO

## 2022-05-29 ENCOUNTER — Inpatient Hospital Stay: Payer: Medicare PPO

## 2022-05-29 VITALS — BP 134/80 | HR 82 | Temp 96.7°F | Resp 18 | Ht 67.0 in | Wt 197.3 lb

## 2022-05-29 DIAGNOSIS — Z510 Encounter for antineoplastic radiation therapy: Secondary | ICD-10-CM | POA: Diagnosis not present

## 2022-05-29 DIAGNOSIS — C8331 Diffuse large B-cell lymphoma, lymph nodes of head, face, and neck: Secondary | ICD-10-CM

## 2022-05-29 MED ORDER — PALONOSETRON HCL INJECTION 0.25 MG/5ML
0.2500 mg | Freq: Once | INTRAVENOUS | Status: AC
  Administered 2022-05-29: 0.25 mg via INTRAVENOUS
  Filled 2022-05-29: qty 5

## 2022-05-29 MED ORDER — SODIUM CHLORIDE 0.9 % IV SOLN: 375.0000 mg/m2 | Freq: Once | INTRAVENOUS | Status: DC

## 2022-05-29 MED ORDER — SODIUM CHLORIDE 0.9 % IV SOLN
375.0000 mg/m2 | Freq: Once | INTRAVENOUS | Status: AC
  Administered 2022-05-29: 800 mg via INTRAVENOUS
  Filled 2022-05-29: qty 50

## 2022-05-29 MED ORDER — SODIUM CHLORIDE 0.9 % IV SOLN
Freq: Once | INTRAVENOUS | Status: AC
  Filled 2022-05-29: qty 250

## 2022-05-29 MED ORDER — SODIUM CHLORIDE 0.9% FLUSH
10.0000 mL | INTRAVENOUS | Status: DC | PRN
  Administered 2022-05-29: 10 mL
  Filled 2022-05-29: qty 10

## 2022-05-29 MED ORDER — PREDNISONE 50 MG PO TABS: ORAL_TABLET | ORAL | 1 refills | Status: DC

## 2022-05-29 MED ORDER — SODIUM CHLORIDE 0.9 % IV SOLN
10.0000 mg | Freq: Once | INTRAVENOUS | Status: AC
  Administered 2022-05-29: 10 mg via INTRAVENOUS
  Filled 2022-05-29: qty 10

## 2022-05-29 MED ORDER — DIPHENHYDRAMINE HCL 25 MG PO CAPS
50.0000 mg | ORAL_CAPSULE | Freq: Once | ORAL | Status: AC
  Administered 2022-05-29: 50 mg via ORAL
  Filled 2022-05-29: qty 2

## 2022-05-29 MED ORDER — SODIUM CHLORIDE 0.9 % IV SOLN
50.0000 mg/m2 | Freq: Once | INTRAVENOUS | Status: AC
  Administered 2022-05-29: 100 mg via INTRAVENOUS
  Filled 2022-05-29: qty 5

## 2022-05-29 MED ORDER — VINCRISTINE SULFATE CHEMO INJECTION 1 MG/ML
2.0000 mg | Freq: Once | INTRAVENOUS | Status: AC
  Administered 2022-05-29: 2 mg via INTRAVENOUS
  Filled 2022-05-29: qty 2

## 2022-05-29 MED ORDER — SODIUM CHLORIDE 0.9 % IV SOLN
1500.0000 mg | Freq: Once | INTRAVENOUS | Status: AC
  Administered 2022-05-29: 1500 mg via INTRAVENOUS
  Filled 2022-05-29: qty 50

## 2022-05-29 MED ORDER — ACETAMINOPHEN 325 MG PO TABS
650.0000 mg | ORAL_TABLET | Freq: Once | ORAL | Status: AC
  Administered 2022-05-29: 650 mg via ORAL
  Filled 2022-05-29: qty 2

## 2022-05-29 MED ORDER — HEPARIN SOD (PORK) LOCK FLUSH 100 UNIT/ML IV SOLN
500.0000 [IU] | Freq: Once | INTRAVENOUS | Status: AC | PRN
  Administered 2022-05-29: 500 [IU]
  Filled 2022-05-29: qty 5

## 2022-05-29 NOTE — Patient Instructions (Signed)
[facility] [place]  Discharge Instructions: Thank you for choosing [facility] to provide your oncology and hematology care.  If you have a lab appointment with the [facility], please go directly to the [facility] and check in at the registration area.  Wear comfortable clothing and clothing appropriate for easy access to any Portacath or PICC line.   We strive to give you quality time with your provider. You may need to reschedule your appointment if you arrive late (15 or more minutes).  Arriving late affects you and other patients whose appointments are after yours.  Also, if you miss three or more appointments without notifying the office, you may be dismissed from the clinic at the provider's discretion.      For prescription refill requests, have your pharmacy contact our office and allow 72 hours for refills to be completed.    Today you received the following chemotherapy and/or immunotherapy agents CYTOXAN, RUIEXENE, ETOPOSIDE, VINCRISTINE      To help prevent nausea and vomiting after your treatment, we encourage you to take your nausea medication as directed.  BELOW ARE SYMPTOMS THAT SHOULD BE REPORTED IMMEDIATELY: *FEVER GREATER THAN 100.4 F (38 C) OR HIGHER *CHILLS OR SWEATING *NAUSEA AND VOMITING THAT IS NOT CONTROLLED WITH YOUR NAUSEA MEDICATION *UNUSUAL SHORTNESS OF BREATH *UNUSUAL BRUISING OR BLEEDING *URINARY PROBLEMS (pain or burning when urinating, or frequent urination) *BOWEL PROBLEMS (unusual diarrhea, constipation, pain near the anus) TENDERNESS IN MOUTH AND THROAT WITH OR WITHOUT PRESENCE OF ULCERS (sore throat, sores in mouth, or a toothache) UNUSUAL RASH, SWELLING OR PAIN  UNUSUAL VAGINAL DISCHARGE OR ITCHING   Items with * indicate a potential emergency and should be followed up as soon as possible or go to the Emergency Department if any problems should occur.  Please show the CHEMOTHERAPY ALERT CARD or  IMMUNOTHERAPY ALERT CARD at check-in to the Emergency Department and triage nurse.  Should you have questions after your visit or need to cancel or reschedule your appointment, please contact [facility] [place] [facility] [place]  [phone] and follow the prompts.  Office hours are 8:00 a.m. to 4:30 p.m. Monday - Friday. Please note that voicemails left after 4:00 p.m. may not be returned until the following business day.  We are closed weekends and major holidays. You have access to a nurse at all times for urgent questions. Please call the main number to the clinic [phone] and follow the prompts.  For any non-urgent questions, you may also contact your provider using MyChart. We now offer e-Visits for anyone 32 and older to request care online for non-urgent symptoms. For details visit [email or web address].   Also download the MyChart app! Go to the app store, search "MyChart", open the app, select [facility], and log in with your MyChart username and password.  Masks are optional in the cancer centers. If you would like for your care team to wear a mask while they are taking care of you, please let them know. For doctor visits, patients may have with them one support person who is at least 73 years old. At this time, visitors are not allowed in the infusion area.  Cyclophosphamide Injection What is this medication? CYCLOPHOSPHAMIDE (sye kloe FOSS fa mide) is a chemotherapy drug. It slows the growth of cancer cells. This medicine is used to treat many types of cancer like lymphoma, myeloma, leukemia, breast cancer, and ovarian cancer, to name a few. This medicine may be used for other  purposes; ask your health care provider or pharmacist if you have questions. COMMON BRAND NAME(S): Cyclophosphamide, Cytoxan, Neosar What should I tell my care team before I take this medication? They need to know if you have any of these conditions: heart disease history of irregular  heartbeat infection kidney disease liver disease low blood counts, like white cells, platelets, or red blood cells on hemodialysis recent or ongoing radiation therapy scarring or thickening of the lungs trouble passing urine an unusual or allergic reaction to cyclophosphamide, other medicines, foods, dyes, or preservatives pregnant or trying to get pregnant breast-feeding How should I use this medication? This drug is usually given as an injection into a vein or muscle or by infusion into a vein. It is administered in a hospital or clinic by a specially trained health care professional. Talk to your pediatrician regarding the use of this medicine in children. Special care may be needed. Overdosage: If you think you have taken too much of this medicine contact a poison control center or emergency room at once. NOTE: This medicine is only for you. Do not share this medicine with others. What if I miss a dose? It is important not to miss your dose. Call your doctor or health care professional if you are unable to keep an appointment. What may interact with this medication? amphotericin B azathioprine certain antivirals for HIV or hepatitis certain medicines for blood pressure, heart disease, irregular heart beat certain medicines that treat or prevent blood clots like warfarin certain other medicines for cancer cyclosporine etanercept indomethacin medicines that relax muscles for surgery medicines to increase blood counts metronidazole This list may not describe all possible interactions. Give your health care provider a list of all the medicines, herbs, non-prescription drugs, or dietary supplements you use. Also tell them if you smoke, drink alcohol, or use illegal drugs. Some items may interact with your medicine. What should I watch for while using this medication? Your condition will be monitored carefully while you are receiving this medicine. You may need blood work done while  you are taking this medicine. Drink water or other fluids as directed. Urinate often, even at night. Some products may contain alcohol. Ask your health care professional if this medicine contains alcohol. Be sure to tell all health care professionals you are taking this medicine. Certain medicines, like metronidazole and disulfiram, can cause an unpleasant reaction when taken with alcohol. The reaction includes flushing, headache, nausea, vomiting, sweating, and increased thirst. The reaction can last from 30 minutes to several hours. Do not become pregnant while taking this medicine or for 1 year after stopping it. Women should inform their health care professional if they wish to become pregnant or think they might be pregnant. Men should not father a child while taking this medicine and for 4 months after stopping it. There is potential for serious side effects to an unborn child. Talk to your health care professional for more information. Do not breast-feed an infant while taking this medicine or for 1 week after stopping it. This medicine has caused ovarian failure in some women. This medicine may make it more difficult to get pregnant. Talk to your health care professional if you are concerned about your fertility. This medicine has caused decreased sperm counts in some men. This may make it more difficult to father a child. Talk to your health care professional if you are concerned about your fertility. Call your health care professional for advice if you get a fever, chills, or sore  throat, or other symptoms of a cold or flu. Do not treat yourself. This medicine decreases your body's ability to fight infections. Try to avoid being around people who are sick. Avoid taking medicines that contain aspirin, acetaminophen, ibuprofen, naproxen, or ketoprofen unless instructed by your health care professional. These medicines may hide a fever. Talk to your health care professional about your risk of cancer.  You may be more at risk for certain types of cancer if you take this medicine. If you are going to need surgery or other procedure, tell your health care professional that you are using this medicine. Be careful brushing or flossing your teeth or using a toothpick because you may get an infection or bleed more easily. If you have any dental work done, tell your dentist you are receiving this medicine. What side effects may I notice from receiving this medication? Side effects that you should report to your doctor or health care professional as soon as possible: allergic reactions like skin rash, itching or hives, swelling of the face, lips, or tongue breathing problems nausea, vomiting signs and symptoms of bleeding such as bloody or black, tarry stools; red or dark brown urine; spitting up blood or brown material that looks like coffee grounds; red spots on the skin; unusual bruising or bleeding from the eyes, gums, or nose signs and symptoms of heart failure like fast, irregular heartbeat, sudden weight gain; swelling of the ankles, feet, hands signs and symptoms of infection like fever; chills; cough; sore throat; pain or trouble passing urine signs and symptoms of kidney injury like trouble passing urine or change in the amount of urine signs and symptoms of liver injury like dark yellow or brown urine; general ill feeling or flu-like symptoms; light-colored stools; loss of appetite; nausea; right upper belly pain; unusually weak or tired; yellowing of the eyes or skin Side effects that usually do not require medical attention (report to your doctor or health care professional if they continue or are bothersome): confusion decreased hearing diarrhea facial flushing hair loss headache loss of appetite missed menstrual periods signs and symptoms of low red blood cells or anemia such as unusually weak or tired; feeling faint or lightheaded; falls skin discoloration This list may not describe  all possible side effects. Call your doctor for medical advice about side effects. You may report side effects to FDA at 1-800-FDA-1088. Where should I keep my medication? This drug is given in a hospital or clinic and will not be stored at home. NOTE: This sheet is a summary. It may not cover all possible information. If you have questions about this medicine, talk to your doctor, pharmacist, or health care provider.  2023 Elsevier/Gold Standard (2021-10-27 00:00:00)   Rituximab Injection What is this medication? RITUXIMAB (ri TUX i mab) is a monoclonal antibody. It is used to treat certain types of cancer like non-Hodgkin lymphoma and chronic lymphocytic leukemia. It is also used to treat rheumatoid arthritis, granulomatosis with polyangiitis, microscopic polyangiitis, and pemphigus vulgaris. This medicine may be used for other purposes; ask your health care provider or pharmacist if you have questions. COMMON BRAND NAME(S): RIABNI, Rituxan, RUXIENCE, truxima What should I tell my care team before I take this medication? They need to know if you have any of these conditions: chest pain heart disease infection especially a viral infection such as chickenpox, cold sores, hepatitis B, or herpes immune system problems irregular heartbeat or rhythm kidney disease low blood counts (white cells, platelets, or red cells) lung  disease recent or upcoming vaccine an unusual or allergic reaction to rituximab, other medicines, foods, dyes, or preservatives pregnant or trying to get pregnant breast-feeding How should I use this medication? This medicine is injected into a vein. It is given by a health care provider in a hospital or clinic setting. A special MedGuide will be given to you before each treatment. Be sure to read this information carefully each time. Talk to your health care provider about the use of this medicine in children. While this drug may be prescribed for children as young as 6  months for selected conditions, precautions do apply. Overdosage: If you think you have taken too much of this medicine contact a poison control center or emergency room at once. NOTE: This medicine is only for you. Do not share this medicine with others. What if I miss a dose? Keep appointments for follow-up doses. It is important not to miss your dose. Call your health care provider if you are unable to keep an appointment. What may interact with this medication? Do not take this medicine with any of the following medicines: live vaccines This medicine may also interact with the following medicines: cisplatin This list may not describe all possible interactions. Give your health care provider a list of all the medicines, herbs, non-prescription drugs, or dietary supplements you use. Also tell them if you smoke, drink alcohol, or use illegal drugs. Some items may interact with your medicine. What should I watch for while using this medication? Your condition will be monitored carefully while you are receiving this medicine. You may need blood work done while you are taking this medicine. This medicine can cause serious infusion reactions. To reduce the risk your health care provider may give you other medicines to take before receiving this one. Be sure to follow the directions from your health care provider. This medicine may increase your risk of getting an infection. Call your health care provider for advice if you get a fever, chills, sore throat, or other symptoms of a cold or flu. Do not treat yourself. Try to avoid being around people who are sick. Call your health care provider if you are around anyone with measles, chickenpox, or if you develop sores or blisters that do not heal properly. Avoid taking medicines that contain aspirin, acetaminophen, ibuprofen, naproxen, or ketoprofen unless instructed by your health care provider. These medicines may hide a fever. This medicine may cause  serious skin reactions. They can happen weeks to months after starting the medicine. Contact your health care provider right away if you notice fevers or flu-like symptoms with a rash. The rash may be red or purple and then turn into blisters or peeling of the skin. Or, you might notice a red rash with swelling of the face, lips or lymph nodes in your neck or under your arms. In some patients, this medicine may cause a serious brain infection that may cause death. If you have any problems seeing, thinking, speaking, walking, or standing, tell your healthcare professional right away. If you cannot reach your healthcare professional, urgently seek other source of medical care. Do not become pregnant while taking this medicine or for at least 12 months after stopping it. Women should inform their health care provider if they wish to become pregnant or think they might be pregnant. There is potential for serious harm to an unborn child. Talk to your health care provider for more information. Women should use a reliable form of birth control while  taking this medicine and for 12 months after stopping it. Do not breast-feed while taking this medicine or for at least 6 months after stopping it. What side effects may I notice from receiving this medication? Side effects that you should report to your health care provider as soon as possible: allergic reactions (skin rash, itching or hives; swelling of the face, lips, or tongue) diarrhea edema (sudden weight gain; swelling of the ankles, feet, hands or other unusual swelling; trouble breathing) fast, irregular heartbeat heart attack (trouble breathing; pain or tightness in the chest, neck, back or arms; unusually weak or tired) infection (fever, chills, cough, sore throat, pain or trouble passing urine) kidney injury (trouble passing urine or change in the amount of urine) liver injury (dark yellow or brown urine; general ill feeling or flu-like symptoms; loss of  appetite, right upper belly pain; unusually weak or tired, yellowing of the eyes or skin) low blood pressure (dizziness; feeling faint or lightheaded, falls; unusually weak or tired) low red blood cell counts (trouble breathing; feeling faint; lightheaded, falls; unusually weak or tired) mouth sores redness, blistering, peeling, or loosening of the skin, including inside the mouth stomach pain unusual bruising or bleeding wheezing (trouble breathing with loud or whistling sounds) vomiting Side effects that usually do not require medical attention (report to your health care provider if they continue or are bothersome): headache joint pain muscle cramps, pain nausea This list may not describe all possible side effects. Call your doctor for medical advice about side effects. You may report side effects to FDA at 1-800-FDA-1088. Where should I keep my medication? This medicine is given in a hospital or clinic. It will not be stored at home. NOTE: This sheet is a summary. It may not cover all possible information. If you have questions about this medicine, talk to your doctor, pharmacist, or health care provider.  2023 Elsevier/Gold Standard (2020-11-28 00:00:00)  Vincristine injection What is this medication? VINCRISTINE (vin KRIS teen) is a chemotherapy drug. It slows the growth of cancer cells. This medicine is used to treat many types of cancer like Hodgkin's disease, leukemia, non-Hodgkin's lymphoma, neuroblastoma (brain cancer), rhabdomyosarcoma, and Wilms' tumor. This medicine may be used for other purposes; ask your health care provider or pharmacist if you have questions. COMMON BRAND NAME(S): Oncovin, Vincasar PFS What should I tell my care team before I take this medication? They need to know if you have any of these conditions: blood disorders gout infection (especially chickenpox, cold sores, or herpes) kidney disease liver disease lung disease nervous system disease like  Charcot-Marie-Tooth (CMT) recent or ongoing radiation therapy an unusual or allergic reaction to vincristine, other chemotherapy agents, other medicines, foods, dyes, or preservatives pregnant or trying to get pregnant breast-feeding How should I use this medication? This drug is given as an infusion into a vein. It is administered in a hospital or clinic by a specially trained health care professional. If you have pain, swelling, burning, or any unusual feeling around the site of your injection, tell your health care professional right away. Talk to your pediatrician regarding the use of this medicine in children. While this drug may be prescribed for selected conditions, precautions do apply. Overdosage: If you think you have taken too much of this medicine contact a poison control center or emergency room at once. NOTE: This medicine is only for you. Do not share this medicine with others. What if I miss a dose? It is important not to miss your dose. Call your  doctor or health care professional if you are unable to keep an appointment. What may interact with this medication? certain medicines for fungal infections like itraconazole, ketoconazole, posaconazole, voriconazole certain medicines for seizures like phenytoin This list may not describe all possible interactions. Give your health care provider a list of all the medicines, herbs, non-prescription drugs, or dietary supplements you use. Also tell them if you smoke, drink alcohol, or use illegal drugs. Some items may interact with your medicine. What should I watch for while using this medication? This drug may make you feel generally unwell. This is not uncommon, as chemotherapy can affect healthy cells as well as cancer cells. Report any side effects. Continue your course of treatment even though you feel ill unless your doctor tells you to stop. You may need blood work done while you are taking this medicine. This medicine will cause  constipation. Try to have a bowel movement at least every 2 to 3 days. If you do not have a bowel movement for 3 days, call your doctor or health care professional. In some cases, you may be given additional medicines to help with side effects. Follow all directions for their use. Do not become pregnant while taking this medicine. Women should inform their doctor if they wish to become pregnant or think they might be pregnant. There is a potential for serious side effects to an unborn child. Talk to your health care professional or pharmacist for more information. Do not breast-feed an infant while taking this medicine. This medicine may make it more difficult to get pregnant or to father a child. Talk to your healthcare professional if you are concerned about your fertility. What side effects may I notice from receiving this medication? Side effects that you should report to your doctor or health care professional as soon as possible: allergic reactions like skin rash, itching or hives, swelling of the face, lips, or tongue breathing problems confusion or changes in emotions or moods constipation cough mouth sores muscle weakness nausea and vomiting pain, swelling, redness or irritation at the injection site pain, tingling, numbness in the hands or feet problems with balance, talking, walking seizures stomach pain trouble passing urine or change in the amount of urine Side effects that usually do not require medical attention (report to your doctor or health care professional if they continue or are bothersome): diarrhea hair loss jaw pain loss of appetite This list may not describe all possible side effects. Call your doctor for medical advice about side effects. You may report side effects to FDA at 1-800-FDA-1088. Where should I keep my medication? This drug is given in a hospital or clinic and will not be stored at home. NOTE: This sheet is a summary. It may not cover all possible  information. If you have questions about this medicine, talk to your doctor, pharmacist, or health care provider.  2023 Elsevier/Gold Standard (2021-10-27 00:00:00)  Etoposide, VP-16 injection What is this medication? ETOPOSIDE, VP-16 (e toe POE side) is a chemotherapy drug. It is used to treat testicular cancer, lung cancer, and other cancers. This medicine may be used for other purposes; ask your health care provider or pharmacist if you have questions. COMMON BRAND NAME(S): Etopophos, Toposar, VePesid What should I tell my care team before I take this medication? They need to know if you have any of these conditions: infection kidney disease liver disease low blood counts, like low white cell, platelet, or red cell counts an unusual or allergic reaction to etoposide,  other medicines, foods, dyes, or preservatives pregnant or trying to get pregnant breast-feeding How should I use this medication? This medicine is for infusion into a vein. It is administered in a hospital or clinic by a specially trained health care professional. Talk to your pediatrician regarding the use of this medicine in children. Special care may be needed. Overdosage: If you think you have taken too much of this medicine contact a poison control center or emergency room at once. NOTE: This medicine is only for you. Do not share this medicine with others. What if I miss a dose? It is important not to miss your dose. Call your doctor or health care professional if you are unable to keep an appointment. What may interact with this medication? This medicine may interact with the following medications: warfarin This list may not describe all possible interactions. Give your health care provider a list of all the medicines, herbs, non-prescription drugs, or dietary supplements you use. Also tell them if you smoke, drink alcohol, or use illegal drugs. Some items may interact with your medicine. What should I watch for  while using this medication? Visit your doctor for checks on your progress. This drug may make you feel generally unwell. This is not uncommon, as chemotherapy can affect healthy cells as well as cancer cells. Report any side effects. Continue your course of treatment even though you feel ill unless your doctor tells you to stop. In some cases, you may be given additional medicines to help with side effects. Follow all directions for their use. Call your doctor or health care professional for advice if you get a fever, chills or sore throat, or other symptoms of a cold or flu. Do not treat yourself. This drug decreases your body's ability to fight infections. Try to avoid being around people who are sick. This medicine may increase your risk to bruise or bleed. Call your doctor or health care professional if you notice any unusual bleeding. Talk to your doctor about your risk of cancer. You may be more at risk for certain types of cancers if you take this medicine. Do not become pregnant while taking this medicine or for at least 6 months after stopping it. Women should inform their doctor if they wish to become pregnant or think they might be pregnant. Women of child-bearing potential will need to have a negative pregnancy test before starting this medicine. There is a potential for serious side effects to an unborn child. Talk to your health care professional or pharmacist for more information. Do not breast-feed an infant while taking this medicine. Men must use a latex condom during sexual contact with a woman while taking this medicine and for at least 4 months after stopping it. A latex condom is needed even if you have had a vasectomy. Contact your doctor right away if your partner becomes pregnant. Do not donate sperm while taking this medicine and for at least 4 months after you stop taking this medicine. Men should inform their doctors if they wish to father a child. This medicine may lower sperm  counts. What side effects may I notice from receiving this medication? Side effects that you should report to your doctor or health care professional as soon as possible: allergic reactions like skin rash, itching or hives, swelling of the face, lips, or tongue low blood counts - this medicine may decrease the number of white blood cells, red blood cells, and platelets. You may be at increased risk for infections  and bleeding nausea, vomiting redness, blistering, peeling or loosening of the skin, including inside the mouth signs and symptoms of infection like fever; chills; cough; sore throat; pain or trouble passing urine signs and symptoms of low red blood cells or anemia such as unusually weak or tired; feeling faint or lightheaded; falls; breathing problems unusual bruising or bleeding Side effects that usually do not require medical attention (report to your doctor or health care professional if they continue or are bothersome): changes in taste diarrhea hair loss loss of appetite mouth sores This list may not describe all possible side effects. Call your doctor for medical advice about side effects. You may report side effects to FDA at 1-800-FDA-1088. Where should I keep my medication? This drug is given in a hospital or clinic and will not be stored at home. NOTE: This sheet is a summary. It may not cover all possible information. If you have questions about this medicine, talk to your doctor, pharmacist, or health care provider.  2023 Elsevier/Gold Standard (2021-10-27 00:00:00)

## 2022-05-30 ENCOUNTER — Ambulatory Visit: Payer: Medicare PPO

## 2022-05-30 ENCOUNTER — Encounter: Payer: Self-pay | Admitting: Radiation Oncology

## 2022-05-30 ENCOUNTER — Inpatient Hospital Stay: Payer: Medicare PPO

## 2022-05-30 ENCOUNTER — Ambulatory Visit
Admission: RE | Admit: 2022-05-30 | Discharge: 2022-05-30 | Disposition: A | Discharge status: Home / Self Care | Payer: Medicare PPO | Source: Ambulatory Visit | Attending: Radiation Oncology | Admitting: Radiation Oncology

## 2022-05-30 VITALS — BP 139/73 | HR 82 | Temp 98.2°F | Resp 18

## 2022-05-30 VITALS — Resp 16 | Wt 200.1 lb

## 2022-05-30 DIAGNOSIS — C8331 Diffuse large B-cell lymphoma, lymph nodes of head, face, and neck: Secondary | ICD-10-CM | POA: Insufficient documentation

## 2022-05-30 DIAGNOSIS — Z7952 Long term (current) use of systemic steroids: Secondary | ICD-10-CM | POA: Insufficient documentation

## 2022-05-30 DIAGNOSIS — Z510 Encounter for antineoplastic radiation therapy: Secondary | ICD-10-CM | POA: Diagnosis not present

## 2022-05-30 DIAGNOSIS — Z791 Long term (current) use of non-steroidal anti-inflammatories (NSAID): Secondary | ICD-10-CM | POA: Insufficient documentation

## 2022-05-30 DIAGNOSIS — I4891 Unspecified atrial fibrillation: Secondary | ICD-10-CM | POA: Insufficient documentation

## 2022-05-30 DIAGNOSIS — N189 Chronic kidney disease, unspecified: Secondary | ICD-10-CM | POA: Insufficient documentation

## 2022-05-30 DIAGNOSIS — I10 Essential (primary) hypertension: Secondary | ICD-10-CM | POA: Insufficient documentation

## 2022-05-30 DIAGNOSIS — Z8673 Personal history of transient ischemic attack (TIA), and cerebral infarction without residual deficits: Secondary | ICD-10-CM | POA: Insufficient documentation

## 2022-05-30 DIAGNOSIS — Z85118 Personal history of other malignant neoplasm of bronchus and lung: Secondary | ICD-10-CM | POA: Insufficient documentation

## 2022-05-30 DIAGNOSIS — Z800 Family history of malignant neoplasm of digestive organs: Secondary | ICD-10-CM | POA: Insufficient documentation

## 2022-05-30 DIAGNOSIS — Z79899 Other long term (current) drug therapy: Secondary | ICD-10-CM | POA: Insufficient documentation

## 2022-05-30 DIAGNOSIS — Z87442 Personal history of urinary calculi: Secondary | ICD-10-CM | POA: Insufficient documentation

## 2022-05-30 MED ORDER — SODIUM CHLORIDE 0.9 % IV SOLN
Freq: Once | INTRAVENOUS | Status: AC
  Filled 2022-05-30: qty 250

## 2022-05-30 MED ORDER — PROCHLORPERAZINE MALEATE 10 MG PO TABS
10.0000 mg | ORAL_TABLET | Freq: Once | ORAL | Status: AC
  Administered 2022-05-30: 10 mg via ORAL
  Filled 2022-05-30: qty 1

## 2022-05-30 MED ORDER — HEPARIN SOD (PORK) LOCK FLUSH 100 UNIT/ML IV SOLN
500.0000 [IU] | Freq: Once | INTRAVENOUS | Status: AC | PRN
  Administered 2022-05-30: 500 [IU]
  Filled 2022-05-30: qty 5

## 2022-05-30 MED ORDER — HEPARIN SOD (PORK) LOCK FLUSH 100 UNIT/ML IV SOLN
500.0000 [IU] | Freq: Once | INTRAVENOUS | Status: AC
  Administered 2022-05-30: 500 [IU] via INTRAVENOUS
  Filled 2022-05-30: qty 5

## 2022-05-30 MED ORDER — SODIUM CHLORIDE 0.9 % IV SOLN
50.0000 mg/m2 | Freq: Once | INTRAVENOUS | Status: AC
  Administered 2022-05-30: 100 mg via INTRAVENOUS
  Filled 2022-05-30: qty 5

## 2022-05-30 MED ORDER — SODIUM CHLORIDE 0.9% FLUSH
10.0000 mL | INTRAVENOUS | Status: DC | PRN
  Administered 2022-05-30: 10 mL
  Filled 2022-05-30: qty 10

## 2022-05-30 NOTE — Consult Note (Signed)
NEW PATIENT EVALUATION  Name: Julie Jennings  MRN: [id number]  Date:   05/30/2022     DOB: 1951-04-07   This 71 y.o. 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  REFERRING PHYSICIAN: Idelle Crouch, MD  CHIEF COMPLAINT:  Chief Complaint  Patient presents with   neck cancer    DIAGNOSIS: The encounter diagnosis was Diffuse large B-cell lymphoma of lymph nodes of neck ([place]).   PREVIOUS INVESTIGATIONS:  PET scan CT scans reviewed Pathology reports reviewed  Clinical notes reviewed  HPI: Patient is a 71 year old female who I previously treated her husband for prostate cancer.  She presented with adenopathy in the neck biopsy c.  She has a history of diffuse large B-cell lymphoma back in 2005.  This was initially stage IIIa disease.  She recently presented with enlarging left neck nodes.  She underwent ultrasound-guided biopsy with findings compatible with large B-cell lymphoma.  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  PET CT scan demonstrated large hypermetabolic lymph nodes in bilateral neck left greater than right there was also some right [place] tonsil hypermetabolic activity associate with soft tissue fullness on CT images.  These were all compatible with the involved category 5.  There is no evidence of metabolic activity in the left lung or any other evidence of disease in chest abdomen pelvis or skeleton.  She was started on R-CE OP which she is tolerated well.  She has 1 more cycle which will be completed around the middle of July.  She specifically [place] fever chills night sweats or any dysphagia.  She is seen today for consideration of involved field radiation.  PLANNED TREATMENT REGIMEN: Involved field radiation  PAST MEDICAL HISTORY:  has a past medical history of A-fib ([place]),  Anxiety, Arthritis, BP (high blood pressure) (04/16/2014), Chronic kidney disease, Depression, Dysrhythmia, Endometriosis, Herpes zoster, History of kidney stones, Lung cancer ([place]) (left), Lymphoma ([place]), and Stroke ([place]).    PAST SURGICAL HISTORY:  Past Surgical History:  Procedure Laterality Date   AUGMENTATION MAMMAPLASTY Bilateral    CHOLECYSTECTOMY     COLONOSCOPY     COLONOSCOPY WITH PROPOFOL N/A 04/14/2018   Procedure: COLONOSCOPY WITH PROPOFOL;  Surgeon: Manya Silvas, MD;  [facility] ENDOSCOPY;  Service: Endoscopy;  Laterality: N/A;   DILATION AND CURETTAGE OF UTERUS     HEMORRHOIDECTOMY WITH HEMORRHOID BANDING     HERNIA REPAIR     umbilical hernia   IR IMAGING GUIDED PORT INSERTION  05/04/2022   LUNG REMOVAL, PARTIAL Left    REVERSE SHOULDER ARTHROPLASTY Left 01/04/2022   Procedure: Left reverse shoulder arthroplasty, biceps tenodesis;  Surgeon: Leim Fabry, MD;  Location: [facility] ORS;  Service: Orthopedics;  Laterality: Left;    FAMILY HISTORY: family history includes Colon cancer in her father; Diabetes in her mother; Prostate cancer in her father; Stroke in her mother.  SOCIAL HISTORY:  reports that she has never smoked. She has never used smokeless tobacco. She reports that she does not drink alcohol and does not use drugs.  ALLERGIES: Tylenol [acetaminophen], Buspirone, and Codeine  MEDICATIONS:  Current Outpatient Medications  Medication Sig Dispense Refill   acyclovir (ZOVIRAX) 400 MG tablet Take 1 tablet (400 mg total) by mouth 2 (two) times daily. 60 tablet 4   ALPRAZolam (XANAX) 0.5 MG tablet Take 1 tablet (0.5 mg  total) by mouth 3 (three) times daily as needed for anxiety. (Patient taking differently: Take 0.5 mg by mouth 3 (three) times daily.) 21 tablet 0   aspirin 81 MG chewable tablet Chew by mouth.     furosemide (LASIX) 40 MG tablet Take 40 mg by mouth daily.     ibuprofen (ADVIL) 200 MG tablet Take 600 mg by mouth at bedtime as needed for mild pain  or moderate pain.     ketoconazole (NIZORAL) 2 % cream Apply to the feet QHS 60 g 3   lidocaine-prilocaine (EMLA) cream Apply on the port. 30 -45 min  prior to port access. 30 g 3   losartan (COZAAR) 100 MG tablet Take 100 mg by mouth daily.     meloxicam (MOBIC) 15 MG tablet Take 15 mg by mouth daily.     nebivolol (BYSTOLIC) 10 MG tablet Take 10 mg by mouth daily.     ondansetron (ZOFRAN) 4 MG tablet Take 1 tablet (4 mg total) by mouth every 6 (six) hours as needed for nausea. 20 tablet 0   ondansetron (ZOFRAN) 8 MG tablet One pill every 8 hours as needed for nausea/vomitting. 40 tablet 1   oxyCODONE (OXY IR/ROXICODONE) 5 MG immediate release tablet Take 1 tablet (5 mg total) by mouth every 4 (four) hours as needed for moderate pain (pain score 4-6). 30 tablet 0   pantoprazole (PROTONIX) 40 MG tablet Take 40 mg by mouth 2 (two) times daily.     Polyethyl Glycol-Propyl Glycol (SYSTANE) 0.4-0.3 % SOLN Place 1 drop into both eyes daily as needed (Dry eye).     predniSONE (DELTASONE) 50 MG tablet Take 2 tablets once day x 5 days. START -Day-2 of your chemotherapy. Take in AM; with FOOD. 10 tablet 1   prochlorperazine (COMPAZINE) 10 MG tablet Take 1 tablet (10 mg total) by mouth every 6 (six) hours as needed for nausea or vomiting. 40 tablet 1   tiotropium (SPIRIVA) 18 MCG inhalation capsule Place 18 mcg into inhaler and inhale daily.     triamcinolone (NASACORT) 55 MCG/ACT AERO nasal inhaler Place 2 sprays into the nose daily.     venlafaxine XR (EFFEXOR-XR) 75 MG 24 hr capsule Take 75 mg by mouth 2 (two) times daily.     No current facility-administered medications for this encounter.    ECOG PERFORMANCE STATUS:  0 - Asymptomatic  REVIEW OF SYSTEMS: Patient is history of lung cancer as well as prior history of diffuse large B-cell lymphoma Patient denies any weight loss, fatigue, weakness, fever, chills or night sweats. Patient denies any loss of vision, blurred vision. Patient denies any  ringing  of the ears or hearing loss. No irregular heartbeat. Patient denies heart murmur or history of fainting. Patient denies any chest pain or pain radiating to her upper extremities. Patient denies any shortness of breath, difficulty breathing at night, cough or hemoptysis. Patient denies any swelling in the lower legs. Patient denies any nausea vomiting, vomiting of blood, or coffee ground material in the vomitus. Patient denies any stomach pain. Patient states has had normal bowel movements no significant constipation or diarrhea. Patient denies any dysuria, hematuria or significant nocturia. Patient denies any problems walking, swelling in the joints or loss of balance. Patient denies any skin changes, loss of hair or loss of weight. Patient denies any excessive worrying or anxiety or significant depression. Patient denies any problems with insomnia. Patient denies excessive thirst, polyuria, polydipsia. Patient denies any swollen glands, patient denies easy bruising  or easy bleeding. Patient denies any recent infections, allergies or URI. Patient "s visual fields have not changed significantly in recent time.   PHYSICAL EXAM: Resp 16   Wt 200 lb 1.6 oz (90.8 kg)   BMI 31.34 kg/m  Really no discernible nodes in her left neck at this time or right neck.  No other evidence of axillary or supraclavicular adenopathy.  She has a port placed in her right anterior chest.  Well-developed well-nourished patient in NAD. HEENT reveals PERLA, EOMI, discs not visualized.  Oral cavity is clear. No oral mucosal lesions are identified. Neck is clear without evidence of cervical or supraclavicular adenopathy. Lungs are clear to A&P. Cardiac examination is essentially unremarkable with regular rate and rhythm without murmur rub or thrill. Abdomen is benign with no organomegaly or masses noted. Motor sensory and DTR levels are equal and symmetric in the upper and lower extremities. Cranial nerves II through XII are  grossly intact. Proprioception is intact. No peripheral adenopathy or edema is identified. No motor or sensory levels are noted. Crude visual fields are within normal range.  LABORATORY DATA: Pathology reports reviewed    RADIOLOGY RESULTS: PET CT scan reviewed compatible with above-stated findings   IMPRESSION: Recurrent stage II diffuse large B-cell lymphoma of the neck and 71 year old female  PLAN: At this time elect to go ahead with involved field radiation therapy.  We will plan on delivering 30 Gray over 3 weeks.  I would use IMRT treatment planning and delivery to spare critical structures such as her salivary glands oral cavity spinal cord and esophagus.  Risks and benefits of treatment including possible dysphagia skin reaction fatigue loss of taste alteration blood counts all were described in detail to the patient.  I have set her up in the July for simulation.  She seems to comprehend our treatment plan well.  We will use PET/CT fusion study for treatment planning.  I would like to take this opportunity to thank you for allowing me to participate in the care of your patient.Noreene Filbert, MD

## 2022-05-30 NOTE — Patient Instructions (Signed)
[facility] CANCER CTR AT [place]  Discharge Instructions: Thank you for choosing [facility] to provide your oncology and hematology care.  If you have a lab appointment with the [facility], please go directly to the [facility] and check in at the registration area.  Wear comfortable clothing and clothing appropriate for easy access to any Portacath or PICC line.   We strive to give you quality time with your provider. You may need to reschedule your appointment if you arrive late (15 or more minutes).  Arriving late affects you and other patients whose appointments are after yours.  Also, if you miss three or more appointments without notifying the office, you may be dismissed from the clinic at the provider's discretion.      For prescription refill requests, have your pharmacy contact our office and allow 72 hours for refills to be completed.    Today you received the following chemotherapy and/or immunotherapy agents ETOPOSIDE        To help prevent nausea and vomiting after your treatment, we encourage you to take your nausea medication as directed.  BELOW ARE SYMPTOMS THAT SHOULD BE REPORTED IMMEDIATELY: *FEVER GREATER THAN 100.4 F (38 C) OR HIGHER *CHILLS OR SWEATING *NAUSEA AND VOMITING THAT IS NOT CONTROLLED WITH YOUR NAUSEA MEDICATION *UNUSUAL SHORTNESS OF BREATH *UNUSUAL BRUISING OR BLEEDING *URINARY PROBLEMS (pain or burning when urinating, or frequent urination) *BOWEL PROBLEMS (unusual diarrhea, constipation, pain near the anus) TENDERNESS IN MOUTH AND THROAT WITH OR WITHOUT PRESENCE OF ULCERS (sore throat, sores in mouth, or a toothache) UNUSUAL RASH, SWELLING OR PAIN  UNUSUAL VAGINAL DISCHARGE OR ITCHING   Items with * indicate a potential emergency and should be followed up as soon as possible or go to the Emergency Department if any problems should occur.  Please show the CHEMOTHERAPY ALERT CARD or IMMUNOTHERAPY ALERT CARD at check-in  to the Emergency Department and triage nurse.  Should you have questions after your visit or need to cancel or reschedule your appointment, please contact [facility] CANCER [place] AT [place]  [phone] and follow the prompts.  Office hours are 8:00 a.m. to 4:30 p.m. Monday - Friday. Please note that voicemails left after 4:00 p.m. may not be returned until the following business day.  We are closed weekends and major holidays. You have access to a nurse at all times for urgent questions. Please call the main number to the clinic [phone] and follow the prompts.  For any non-urgent questions, you may also contact your provider using MyChart. We now offer e-Visits for anyone 75 and older to request care online for non-urgent symptoms. For details visit [email or web address].   Also download the MyChart app! Go to the app store, search "MyChart", open the app, select [facility], and log in with your MyChart username and password.  Masks are optional in the cancer centers. If you would like for your care team to wear a mask while they are taking care of you, please let them know. For doctor visits, patients may have with them one support person who is at least 73 years old. At this time, visitors are not allowed in the infusion area.  Etoposide, VP-16 injection What is this medication? ETOPOSIDE, VP-16 (e toe POE side) is a chemotherapy drug. It is used to treat testicular cancer, lung cancer, and other cancers. This medicine may be used for other purposes; ask your health care provider or pharmacist if you have questions. COMMON BRAND NAME(S): Etopophos, Toposar, VePesid  What should I tell my care team before I take this medication? They need to know if you have any of these conditions: infection kidney disease liver disease low blood counts, like low white cell, platelet, or red cell counts an unusual or allergic reaction to etoposide, other medicines, foods, dyes, or  preservatives pregnant or trying to get pregnant breast-feeding How should I use this medication? This medicine is for infusion into a vein. It is administered in a hospital or clinic by a specially trained health care professional. Talk to your pediatrician regarding the use of this medicine in children. Special care may be needed. Overdosage: If you think you have taken too much of this medicine contact a poison control center or emergency room at once. NOTE: This medicine is only for you. Do not share this medicine with others. What if I miss a dose? It is important not to miss your dose. Call your doctor or health care professional if you are unable to keep an appointment. What may interact with this medication? This medicine may interact with the following medications: warfarin This list may not describe all possible interactions. Give your health care provider a list of all the medicines, herbs, non-prescription drugs, or dietary supplements you use. Also tell them if you smoke, drink alcohol, or use illegal drugs. Some items may interact with your medicine. What should I watch for while using this medication? Visit your doctor for checks on your progress. This drug may make you feel generally unwell. This is not uncommon, as chemotherapy can affect healthy cells as well as cancer cells. Report any side effects. Continue your course of treatment even though you feel ill unless your doctor tells you to stop. In some cases, you may be given additional medicines to help with side effects. Follow all directions for their use. Call your doctor or health care professional for advice if you get a fever, chills or sore throat, or other symptoms of a cold or flu. Do not treat yourself. This drug decreases your body's ability to fight infections. Try to avoid being around people who are sick. This medicine may increase your risk to bruise or bleed. Call your doctor or health care professional if you  notice any unusual bleeding. Talk to your doctor about your risk of cancer. You may be more at risk for certain types of cancers if you take this medicine. Do not become pregnant while taking this medicine or for at least 6 months after stopping it. Women should inform their doctor if they wish to become pregnant or think they might be pregnant. Women of child-bearing potential will need to have a negative pregnancy test before starting this medicine. There is a potential for serious side effects to an unborn child. Talk to your health care professional or pharmacist for more information. Do not breast-feed an infant while taking this medicine. Men must use a latex condom during sexual contact with a woman while taking this medicine and for at least 4 months after stopping it. A latex condom is needed even if you have had a vasectomy. Contact your doctor right away if your partner becomes pregnant. Do not donate sperm while taking this medicine and for at least 4 months after you stop taking this medicine. Men should inform their doctors if they wish to father a child. This medicine may lower sperm counts. What side effects may I notice from receiving this medication? Side effects that you should report to your doctor or health care  professional as soon as possible: allergic reactions like skin rash, itching or hives, swelling of the face, lips, or tongue low blood counts - this medicine may decrease the number of white blood cells, red blood cells, and platelets. You may be at increased risk for infections and bleeding nausea, vomiting redness, blistering, peeling or loosening of the skin, including inside the mouth signs and symptoms of infection like fever; chills; cough; sore throat; pain or trouble passing urine signs and symptoms of low red blood cells or anemia such as unusually weak or tired; feeling faint or lightheaded; falls; breathing problems unusual bruising or bleeding Side effects that  usually do not require medical attention (report to your doctor or health care professional if they continue or are bothersome): changes in taste diarrhea hair loss loss of appetite mouth sores This list may not describe all possible side effects. Call your doctor for medical advice about side effects. You may report side effects to FDA at 1-800-FDA-1088. Where should I keep my medication? This drug is given in a hospital or clinic and will not be stored at home. NOTE: This sheet is a summary. It may not cover all possible information. If you have questions about this medicine, talk to your doctor, pharmacist, or health care provider.  2023 Elsevier/Gold Standard (2021-10-27 00:00:00)

## 2022-05-31 ENCOUNTER — Inpatient Hospital Stay: Payer: Medicare PPO

## 2022-05-31 VITALS — BP 147/77 | HR 68 | Temp 98.0°F | Resp 19

## 2022-05-31 DIAGNOSIS — Z510 Encounter for antineoplastic radiation therapy: Secondary | ICD-10-CM | POA: Diagnosis not present

## 2022-05-31 DIAGNOSIS — C8331 Diffuse large B-cell lymphoma, lymph nodes of head, face, and neck: Secondary | ICD-10-CM

## 2022-05-31 MED ORDER — SODIUM CHLORIDE 0.9 % IV SOLN
50.0000 mg/m2 | Freq: Once | INTRAVENOUS | Status: AC
  Administered 2022-05-31: 100 mg via INTRAVENOUS
  Filled 2022-05-31: qty 5

## 2022-05-31 MED ORDER — HEPARIN SOD (PORK) LOCK FLUSH 100 UNIT/ML IV SOLN
500.0000 [IU] | Freq: Once | INTRAVENOUS | Status: AC | PRN
  Filled 2022-05-31: qty 5

## 2022-05-31 MED ORDER — HEPARIN SOD (PORK) LOCK FLUSH 100 UNIT/ML IV SOLN
INTRAVENOUS | Status: AC
  Administered 2022-05-31: 500 [IU]
  Filled 2022-05-31: qty 5

## 2022-05-31 MED ORDER — SODIUM CHLORIDE 0.9 % IV SOLN
Freq: Once | INTRAVENOUS | Status: AC
  Filled 2022-05-31: qty 250

## 2022-05-31 MED ORDER — PROCHLORPERAZINE MALEATE 10 MG PO TABS
10.0000 mg | ORAL_TABLET | Freq: Once | ORAL | Status: AC
  Administered 2022-05-31: 10 mg via ORAL
  Filled 2022-05-31: qty 1

## 2022-05-31 NOTE — Patient Instructions (Signed)
[facility] CANCER CTR AT [place]  Discharge Instructions: Thank you for choosing [facility] to provide your oncology and hematology care.  If you have a lab appointment with the [facility], please go directly to the [facility] and check in at the registration area.  Wear comfortable clothing and clothing appropriate for easy access to any Portacath or PICC line.   We strive to give you quality time with your provider. You may need to reschedule your appointment if you arrive late (15 or more minutes).  Arriving late affects you and other patients whose appointments are after yours.  Also, if you miss three or more appointments without notifying the office, you may be dismissed from the clinic at the provider's discretion.      For prescription refill requests, have your pharmacy contact our office and allow 72 hours for refills to be completed.    Today you received the following chemotherapy and/or immunotherapy agents Etoposide    To help prevent nausea and vomiting after your treatment, we encourage you to take your nausea medication as directed.  BELOW ARE SYMPTOMS THAT SHOULD BE REPORTED IMMEDIATELY: *FEVER GREATER THAN 100.4 F (38 C) OR HIGHER *CHILLS OR SWEATING *NAUSEA AND VOMITING THAT IS NOT CONTROLLED WITH YOUR NAUSEA MEDICATION *UNUSUAL SHORTNESS OF BREATH *UNUSUAL BRUISING OR BLEEDING *URINARY PROBLEMS (pain or burning when urinating, or frequent urination) *BOWEL PROBLEMS (unusual diarrhea, constipation, pain near the anus) TENDERNESS IN MOUTH AND THROAT WITH OR WITHOUT PRESENCE OF ULCERS (sore throat, sores in mouth, or a toothache) UNUSUAL RASH, SWELLING OR PAIN  UNUSUAL VAGINAL DISCHARGE OR ITCHING   Items with * indicate a potential emergency and should be followed up as soon as possible or go to the Emergency Department if any problems should occur.  Please show the CHEMOTHERAPY ALERT CARD or IMMUNOTHERAPY ALERT CARD at check-in to the  Emergency Department and triage nurse.  Should you have questions after your visit or need to cancel or reschedule your appointment, please contact [facility] CANCER [place] AT [place]  [phone] and follow the prompts.  Office hours are 8:00 a.m. to 4:30 p.m. Monday - Friday. Please note that voicemails left after 4:00 p.m. may not be returned until the following business day.  We are closed weekends and major holidays. You have access to a nurse at all times for urgent questions. Please call the main number to the clinic [phone] and follow the prompts.  For any non-urgent questions, you may also contact your provider using MyChart. We now offer e-Visits for anyone 73 and older to request care online for non-urgent symptoms. For details visit [email or web address].   Also download the MyChart app! Go to the app store, search "MyChart", open the app, select [facility], and log in with your MyChart username and password.  Masks are optional in the cancer centers. If you would like for your care team to wear a mask while they are taking care of you, please let them know. For doctor visits, patients may have with them one support person who is at least 73 years old. At this time, visitors are not allowed in the infusion area.

## 2022-06-01 ENCOUNTER — Inpatient Hospital Stay: Payer: Medicare PPO

## 2022-06-01 DIAGNOSIS — C8331 Diffuse large B-cell lymphoma, lymph nodes of head, face, and neck: Secondary | ICD-10-CM

## 2022-06-01 DIAGNOSIS — Z510 Encounter for antineoplastic radiation therapy: Secondary | ICD-10-CM | POA: Diagnosis not present

## 2022-06-01 MED ORDER — PEGFILGRASTIM-CBQV 6 MG/0.6ML ~~LOC~~ SOSY
6.0000 mg | PREFILLED_SYRINGE | Freq: Once | SUBCUTANEOUS | Status: AC
  Administered 2022-06-01: 6 mg via SUBCUTANEOUS
  Filled 2022-06-01: qty 0.6

## 2022-06-04 ENCOUNTER — Ambulatory Visit: Payer: Medicare PPO

## 2022-06-04 ENCOUNTER — Telehealth: Payer: Self-pay | Admitting: *Deleted

## 2022-06-04 NOTE — Telephone Encounter (Signed)
Got message from Revonda Standard stating that patient has had diarrhea and is very weak and that in her opinion, patient needs IV fluids and asking for a return call to her. I attempted to call her for more information, but got her voice mail and I left message asking her to call me back

## 2022-06-05 ENCOUNTER — Other Ambulatory Visit: Payer: Self-pay

## 2022-06-05 ENCOUNTER — Other Ambulatory Visit: Payer: Self-pay | Admitting: Internal Medicine

## 2022-06-05 ENCOUNTER — Inpatient Hospital Stay: Payer: Medicare PPO

## 2022-06-05 VITALS — BP 114/82 | HR 88 | Temp 99.7°F | Resp 16

## 2022-06-05 DIAGNOSIS — C8331 Diffuse large B-cell lymphoma, lymph nodes of head, face, and neck: Secondary | ICD-10-CM

## 2022-06-05 DIAGNOSIS — Z510 Encounter for antineoplastic radiation therapy: Secondary | ICD-10-CM | POA: Diagnosis not present

## 2022-06-05 DIAGNOSIS — Z95828 Presence of other vascular implants and grafts: Secondary | ICD-10-CM

## 2022-06-05 LAB — COMPREHENSIVE METABOLIC PANEL
ALT: 68 U/L — ABNORMAL HIGH (ref 0–44)
AST: 58 U/L — ABNORMAL HIGH (ref 15–41)
Albumin: 3.5 g/dL (ref 3.5–5.0)
Alkaline Phosphatase: 136 U/L — ABNORMAL HIGH (ref 38–126)
Anion gap: 8 (ref 5–15)
BUN: 16 mg/dL (ref 8–23)
CO2: 28 mmol/L (ref 22–32)
Calcium: 8.9 mg/dL (ref 8.9–10.3)
Chloride: 97 mmol/L — ABNORMAL LOW (ref 98–111)
Creatinine, Ser: 0.81 mg/dL (ref 0.44–1.00)
GFR, Estimated: >60 mL/min (ref >60)
Glucose, Bld: 116 mg/dL — ABNORMAL HIGH (ref 70–99)
Potassium: 3.6 mmol/L (ref 3.5–5.1)
Sodium: 133 mmol/L — ABNORMAL LOW (ref 135–145)
Total Bilirubin: 1.2 mg/dL (ref 0.3–1.2)
Total Protein: 6.9 g/dL (ref 6.5–8.1)

## 2022-06-05 LAB — CBC WITH DIFFERENTIAL/PLATELET
Abs Immature Granulocytes: 0.16 10*3/uL — ABNORMAL HIGH (ref 0.00–0.07)
Basophils Absolute: 0.1 10*3/uL (ref 0.0–0.1)
Basophils Relative: 0 %
Eosinophils Absolute: 0.2 10*3/uL (ref 0.0–0.5)
Eosinophils Relative: 1 %
HCT: 33.4 % — ABNORMAL LOW (ref 36.0–46.0)
Hemoglobin: 11.4 g/dL — ABNORMAL LOW (ref 12.0–15.0)
Immature Granulocytes: 1 %
Lymphocytes Relative: 4 %
Lymphs Abs: 0.5 10*3/uL — ABNORMAL LOW (ref 0.7–4.0)
MCH: 32.2 pg (ref 26.0–34.0)
MCHC: 34.1 g/dL (ref 30.0–36.0)
MCV: 94.4 fL (ref 80.0–100.0)
Monocytes Absolute: 0.5 10*3/uL (ref 0.1–1.0)
Monocytes Relative: 4 %
Neutro Abs: 10.7 10*3/uL — ABNORMAL HIGH (ref 1.7–7.7)
Neutrophils Relative %: 90 %
Platelets: 246 10*3/uL (ref 150–400)
RBC: 3.54 MIL/uL — ABNORMAL LOW (ref 3.87–5.11)
RDW: 14.7 % (ref 11.5–15.5)
Smear Review: NORMAL
WBC: 12 10*3/uL — ABNORMAL HIGH (ref 4.0–10.5)
nRBC: 0 % (ref 0.0–0.2)

## 2022-06-05 LAB — MAGNESIUM: Magnesium: 1.6 mg/dL — ABNORMAL LOW (ref 1.7–2.4)

## 2022-06-05 MED ORDER — HEPARIN SOD (PORK) LOCK FLUSH 100 UNIT/ML IV SOLN
500.0000 [IU] | Freq: Once | INTRAVENOUS | Status: AC
  Administered 2022-06-05: 500 [IU] via INTRAVENOUS
  Filled 2022-06-05: qty 5

## 2022-06-05 MED ORDER — SLOW MAGNESIUM/CALCIUM 70-117 MG PO TBEC: 1.0000 | DELAYED_RELEASE_TABLET | Freq: Two times a day (BID) | ORAL | 6 refills | Status: DC

## 2022-06-05 MED ORDER — SODIUM CHLORIDE 0.9% FLUSH
10.0000 mL | Freq: Once | INTRAVENOUS | Status: AC
  Administered 2022-06-05: 10 mL via INTRAVENOUS
  Filled 2022-06-05: qty 10

## 2022-06-05 MED ORDER — SODIUM CHLORIDE 0.9 % IV SOLN
INTRAVENOUS | Status: AC
  Filled 2022-06-05 (×2): qty 250

## 2022-06-07 ENCOUNTER — Inpatient Hospital Stay: Payer: Medicare PPO

## 2022-06-07 ENCOUNTER — Other Ambulatory Visit: Payer: Self-pay | Admitting: Internal Medicine

## 2022-06-11 ENCOUNTER — Other Ambulatory Visit: Payer: Self-pay | Admitting: *Deleted

## 2022-06-11 ENCOUNTER — Inpatient Hospital Stay (HOSPITAL_BASED_OUTPATIENT_CLINIC_OR_DEPARTMENT_OTHER): Payer: Medicare PPO | Admitting: Medical Oncology

## 2022-06-11 ENCOUNTER — Telehealth: Payer: Self-pay

## 2022-06-11 ENCOUNTER — Inpatient Hospital Stay: Payer: Medicare PPO | Attending: Internal Medicine

## 2022-06-11 ENCOUNTER — Other Ambulatory Visit: Payer: Self-pay

## 2022-06-11 ENCOUNTER — Other Ambulatory Visit: Payer: Medicare PPO

## 2022-06-11 VITALS — BP 141/83 | HR 83 | Temp 95.6°F | Resp 20 | Wt 191.2 lb

## 2022-06-11 DIAGNOSIS — E860 Dehydration: Secondary | ICD-10-CM | POA: Diagnosis not present

## 2022-06-11 DIAGNOSIS — Z9221 Personal history of antineoplastic chemotherapy: Secondary | ICD-10-CM | POA: Insufficient documentation

## 2022-06-11 DIAGNOSIS — I4891 Unspecified atrial fibrillation: Secondary | ICD-10-CM | POA: Diagnosis not present

## 2022-06-11 DIAGNOSIS — Z79899 Other long term (current) drug therapy: Secondary | ICD-10-CM | POA: Insufficient documentation

## 2022-06-11 DIAGNOSIS — R7989 Other specified abnormal findings of blood chemistry: Secondary | ICD-10-CM | POA: Insufficient documentation

## 2022-06-11 DIAGNOSIS — Z85118 Personal history of other malignant neoplasm of bronchus and lung: Secondary | ICD-10-CM | POA: Insufficient documentation

## 2022-06-11 DIAGNOSIS — K5900 Constipation, unspecified: Secondary | ICD-10-CM | POA: Diagnosis not present

## 2022-06-11 DIAGNOSIS — N189 Chronic kidney disease, unspecified: Secondary | ICD-10-CM | POA: Diagnosis not present

## 2022-06-11 DIAGNOSIS — C8331 Diffuse large B-cell lymphoma, lymph nodes of head, face, and neck: Secondary | ICD-10-CM

## 2022-06-11 DIAGNOSIS — F419 Anxiety disorder, unspecified: Secondary | ICD-10-CM | POA: Diagnosis not present

## 2022-06-11 DIAGNOSIS — Z8673 Personal history of transient ischemic attack (TIA), and cerebral infarction without residual deficits: Secondary | ICD-10-CM | POA: Diagnosis not present

## 2022-06-11 DIAGNOSIS — Z5189 Encounter for other specified aftercare: Secondary | ICD-10-CM | POA: Insufficient documentation

## 2022-06-11 DIAGNOSIS — Z5112 Encounter for antineoplastic immunotherapy: Secondary | ICD-10-CM | POA: Insufficient documentation

## 2022-06-11 DIAGNOSIS — Z5111 Encounter for antineoplastic chemotherapy: Secondary | ICD-10-CM | POA: Insufficient documentation

## 2022-06-11 DIAGNOSIS — K760 Fatty (change of) liver, not elsewhere classified: Secondary | ICD-10-CM | POA: Diagnosis not present

## 2022-06-11 DIAGNOSIS — R5383 Other fatigue: Secondary | ICD-10-CM | POA: Diagnosis not present

## 2022-06-11 DIAGNOSIS — R21 Rash and other nonspecific skin eruption: Secondary | ICD-10-CM | POA: Diagnosis not present

## 2022-06-11 DIAGNOSIS — E876 Hypokalemia: Secondary | ICD-10-CM | POA: Insufficient documentation

## 2022-06-11 LAB — COMPREHENSIVE METABOLIC PANEL
ALT: 53 U/L — ABNORMAL HIGH (ref 0–44)
AST: 58 U/L — ABNORMAL HIGH (ref 15–41)
Albumin: 3.7 g/dL (ref 3.5–5.0)
Alkaline Phosphatase: 163 U/L — ABNORMAL HIGH (ref 38–126)
Anion gap: 5 (ref 5–15)
BUN: 15 mg/dL (ref 8–23)
CO2: 27 mmol/L (ref 22–32)
Calcium: 8.6 mg/dL — ABNORMAL LOW (ref 8.9–10.3)
Chloride: 102 mmol/L (ref 98–111)
Creatinine, Ser: 0.44 mg/dL (ref 0.44–1.00)
GFR, Estimated: >60 mL/min (ref >60)
Glucose, Bld: 116 mg/dL — ABNORMAL HIGH (ref 70–99)
Potassium: 3.8 mmol/L (ref 3.5–5.1)
Sodium: 134 mmol/L — ABNORMAL LOW (ref 135–145)
Total Bilirubin: 0.5 mg/dL (ref 0.3–1.2)
Total Protein: 7.2 g/dL (ref 6.5–8.1)

## 2022-06-11 LAB — CBC WITH DIFFERENTIAL/PLATELET
Abs Immature Granulocytes: 1.55 10*3/uL — ABNORMAL HIGH (ref 0.00–0.07)
Basophils Absolute: 0.1 10*3/uL (ref 0.0–0.1)
Basophils Relative: 1 %
Eosinophils Absolute: 0.1 10*3/uL (ref 0.0–0.5)
Eosinophils Relative: 1 %
HCT: 35.8 % — ABNORMAL LOW (ref 36.0–46.0)
Hemoglobin: 11.9 g/dL — ABNORMAL LOW (ref 12.0–15.0)
Immature Granulocytes: 9 %
Lymphocytes Relative: 6 %
Lymphs Abs: 1 10*3/uL (ref 0.7–4.0)
MCH: 31.6 pg (ref 26.0–34.0)
MCHC: 33.2 g/dL (ref 30.0–36.0)
MCV: 95.2 fL (ref 80.0–100.0)
Monocytes Absolute: 1 10*3/uL (ref 0.1–1.0)
Monocytes Relative: 6 %
Neutro Abs: 14 10*3/uL — ABNORMAL HIGH (ref 1.7–7.7)
Neutrophils Relative %: 77 %
Platelets: 187 10*3/uL (ref 150–400)
RBC: 3.76 MIL/uL — ABNORMAL LOW (ref 3.87–5.11)
RDW: 15.5 % (ref 11.5–15.5)
Smear Review: NORMAL
WBC: 17.8 10*3/uL — ABNORMAL HIGH (ref 4.0–10.5)
nRBC: 0.2 % (ref 0.0–0.2)

## 2022-06-11 LAB — MAGNESIUM: Magnesium: 2 mg/dL (ref 1.7–2.4)

## 2022-06-11 MED ORDER — HEPARIN SOD (PORK) LOCK FLUSH 100 UNIT/ML IV SOLN
500.0000 [IU] | Freq: Once | INTRAVENOUS | Status: AC
  Administered 2022-06-11: 500 [IU]
  Filled 2022-06-11: qty 5

## 2022-06-11 MED ORDER — SODIUM CHLORIDE 0.9 % IV SOLN
Freq: Once | INTRAVENOUS | Status: AC
  Filled 2022-06-11: qty 250

## 2022-06-11 NOTE — Telephone Encounter (Signed)
I contacted Julie Jennings. Caregiver agreed to bring patient at 1pm. Patient added to [facility] schedule. Per Judson Roch- lab orders entered- cbc/metc/ mag

## 2022-06-11 NOTE — Progress Notes (Signed)
Symptom Management [place] at [facility] [phone] [phone]  Patient Care Team: Idelle Crouch, MD as PCP - General (Internal Medicine) Cammie Sickle, MD as Consulting Physician (Oncology)   Name of the patient: Julie Jennings  [id number]  06-Apr-1951   Date of visit: 06/11/22  Reason for Consult: Julie Jennings is a 71 y.o. female who presents today for:  Constipation: Patient reports that she has had some troubles with constipation since Wednesday. She reports that she thinks this is due to less activity from some fatigue post chemo as well as taking her anti nausea medications with reduced hydration. She reports that she had a bowel movement this morning that was firm but not painful to pass. Feels like she can go more soon. Using miralax, Decolace, magnesium supplementation. No vomiting, nausea, abdominal pain, fevers, diarrhea. Asks for IVF as this helped with constipation in the past.   Denies any neurologic complaints. Denies recent fevers or illnesses. Denies any easy bleeding or bruising. Reports good appetite and denies weight loss. Denies chest pain. Denies any nausea, vomiting, or diarrhea. Denies urinary complaints. Patient offers no further specific complaints today.    PAST MEDICAL HISTORY: Past Medical History:  Diagnosis Date   A-fib ([place])    Only once   Anxiety    Arthritis    oesteoarthritis   BP (high blood pressure) 04/16/2014   Chronic kidney disease    history nephrolithiasis   Depression    Dysrhythmia    PSVT   Endometriosis    Herpes zoster    History of kidney stones    Lung cancer ([place]) left   Lymphoma ([place])    "stomach"   Stroke ([place])     PAST SURGICAL HISTORY:  Past Surgical History:  Procedure Laterality Date   AUGMENTATION MAMMAPLASTY Bilateral    CHOLECYSTECTOMY     COLONOSCOPY     COLONOSCOPY WITH PROPOFOL N/A 04/14/2018   Procedure: COLONOSCOPY WITH PROPOFOL;  Surgeon:  Manya Silvas, MD;  Location: [facility] ENDOSCOPY;  Service: Endoscopy;  Laterality: N/A;   DILATION AND CURETTAGE OF UTERUS     HEMORRHOIDECTOMY WITH HEMORRHOID BANDING     HERNIA REPAIR     umbilical hernia   IR IMAGING GUIDED PORT INSERTION  05/04/2022   LUNG REMOVAL, PARTIAL Left    REVERSE SHOULDER ARTHROPLASTY Left 01/04/2022   Procedure: Left reverse shoulder arthroplasty, biceps tenodesis;  Surgeon: Leim Fabry, MD;  Location: [facility] ORS;  Service: Orthopedics;  Laterality: Left;    HEMATOLOGY/ONCOLOGY HISTORY:  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 Biopsy is suggestive of fatty liver changes  # carcinoma of lung status post left upper lobe resection in July of 2014; Adenocarcinoma T1N0 M0 tumor EGFR positive  SURGICAL PATHOLOGY  CASE: [id number]  PATIENT: Samantha Woolbright  Surgical Pathology Report      Specimen Submitted:  A. Lymph node, left neck   Clinical History: New left cervical lymphadenopathy.  History of  lymphoma and lung cancer.  New lymphadenopathy    MAY 17th, 2023- [Dr.juengle]; A. LYMPH NODE, LEFT NECK; ULTRASOUND-GUIDED BIOPSY:  - FINDINGS COMPATIBLE WITH LARGE B-CELL LYMPHOMA.   Comment:  Core biopsy sections display lymphoid tissue with a somewhat effaced  immunoarchitecture.  Portions of nodal tissue are comprised of sheets of  small lymphocytes, while other areas display dense background fibrosis,  and aggregates of large abnormal lymphocytes with irregular nuclear  contours, open chromatin, visible nucleoli, and retraction artifact.   Immunohistochemical studies demonstrate diffuse positivity for CD20  within regions comprised of larger lymphocytes, compatible with B cells.  CD3 highlights a background of small T cells.  CK AE1/AE3 is negative  for metastatic carcinoma.  PAX5 displays dim, patchy marking of larger  abnormal B cells.  In addition, these B cells appear  to display dim  expression of CD10, with positivity for Bcl-2, BCL6, and Mum-1.  B cells  are negative for CD5.  CD30 displays increased marking.  C-Myc is  positive, staining greater than 40% of larger cells.  CD45 (LCA) is  diffusely positive, without aberrant loss of expression.   Concurrent flow cytometric studies demonstrate a CD10 positive  monoclonal B-cell population in a background of many polytypic B cells,  representing 3% of total viable lymphoid cells and 8% of B cells.  For  further details, see scanned report in [facility].   The patient's history is of diffuse large B-cell lymphoma, as well as  invasive adenocarcinoma of the lung are noted.  Biopsy sections  demonstrate an abnormal proliferation of large B cells, compatible with  involvement by a diffuse large B-cell lymphoma. Further  subclassification is difficult, secondary to limited tissue, however,  presence of C10 marking would suggest a germinal center immunophenotype.  In addition, there does appear to be expression of both Bcl-2 and c-myc,  which may suggest a more aggressive process. There is no evidence of  metastatic adenocarcinoma. FISH testing for prognostically significant  abnormalities of BCL2, BCL6, and MYC will be attempted, and reported as  an addendum.   IMPRESSION: 1. Enlarged hypermetabolic lymph nodes in the bilateral left greater than right neck and asymmetric right palatine tonsil hypermetabolism with associated soft tissue fullness on the CT images, all new since 2014 PET-CT, most compatible with recurrent lymphoma. Deauville category 5.  # MAY 12th, 2023- DLBCL- STAGE II [NO Bone marrow]; GCB; double expresser-mcy; Bcl-2; QNS-FISH.   # MAY 30th, 2023- RCEOP q 3 W x3-RT   Cancer of upper lobe of left lung (HCC)  Diffuse large B-cell lymphoma of lymph nodes of neck ([place])  04/25/2022 Initial Diagnosis   Diffuse large B-cell lymphoma of lymph nodes of neck ([place])   04/26/2022 Cancer Staging    Staging form: Hodgkin and Non-Hodgkin Lymphoma, AJCC 8th Edition - Clinical: Stage II - Signed by Cammie Sickle, MD on 04/26/2022 Histopathologic type: Malignant lymphoma, large B-cell, diffuse, NOS   05/08/2022 -  Chemotherapy   [name]       ALLERGIES:  is allergic to tylenol [acetaminophen], buspirone, and codeine.  MEDICATIONS:  Current Outpatient Medications  Medication Sig Dispense Refill   acyclovir (ZOVIRAX) 400 MG tablet Take 1 tablet (400 mg total) by mouth 2 (two) times daily. 60 tablet 4   ALPRAZolam (XANAX) 0.5 MG tablet Take 1 tablet (0.5 mg total) by mouth 3 (three) times daily as needed for anxiety. (Patient taking differently: Take 0.5 mg by mouth 3 (three) times daily.) 21 tablet 0   aspirin 81 MG chewable tablet Chew by mouth.     furosemide (LASIX) 40 MG tablet Take 40 mg by mouth daily.     lidocaine-prilocaine (EMLA) cream Apply on the port. 30 -45 min  prior to port access. 30 g 3   loratadine (CLARITIN) 10 MG tablet Take 10 mg by mouth daily as needed for allergies.  losartan (COZAAR) 100 MG tablet Take 100 mg by mouth daily.     Magnesium Cl-Calcium Carbonate (SLOW MAGNESIUM/CALCIUM) 70-117 MG TBEC Take 1 tablet by mouth 2 (two) times daily. 60 tablet 6   meloxicam (MOBIC) 15 MG tablet Take 15 mg by mouth daily.     nebivolol (BYSTOLIC) 10 MG tablet Take 10 mg by mouth daily.     ondansetron (ZOFRAN) 4 MG tablet Take 1 tablet (4 mg total) by mouth every 6 (six) hours as needed for nausea. 20 tablet 0   ondansetron (ZOFRAN) 8 MG tablet One pill every 8 hours as needed for nausea/vomitting. 40 tablet 0   oxyCODONE (OXY IR/ROXICODONE) 5 MG immediate release tablet Take 1 tablet (5 mg total) by mouth every 4 (four) hours as needed for moderate pain (pain score 4-6). 30 tablet 0   pantoprazole (PROTONIX) 40 MG tablet Take 40 mg by mouth 2 (two) times daily.     Polyethyl Glycol-Propyl Glycol  (SYSTANE) 0.4-0.3 % SOLN Place 1 drop into both eyes daily as needed (Dry eye).     prochlorperazine (COMPAZINE) 10 MG tablet Take 1 tablet (10 mg total) by mouth every 6 (six) hours as needed for nausea or vomiting. 40 tablet 1   tiotropium (SPIRIVA) 18 MCG inhalation capsule Place 18 mcg into inhaler and inhale daily.     triamcinolone (NASACORT) 55 MCG/ACT AERO nasal inhaler Place 2 sprays into the nose daily.     venlafaxine XR (EFFEXOR-XR) 75 MG 24 hr capsule Take 75 mg by mouth 2 (two) times daily.     ketoconazole (NIZORAL) 2 % cream Apply to the feet QHS (Patient not taking: Reported on 06/11/2022) 60 g 3   predniSONE (DELTASONE) 50 MG tablet Take 2 tablets once day x 5 days. START -Day-2 of your chemotherapy. Take in AM; with FOOD. (Patient not taking: Reported on 06/11/2022) 10 tablet 1   No current facility-administered medications for this visit.   Facility-Administered Medications Ordered in Other Visits  Medication Dose Route Frequency Provider Last Rate Last Admin   heparin lock flush 100 unit/mL  500 Units Intracatheter Once Timohty Renbarger M, [place]        VITAL SIGNS: BP (!) 141/83   Pulse 83   Temp (!) 95.6 F (35.3 C) (Tympanic)   Resp 20   Wt 191 lb 3.2 oz (86.7 kg)   SpO2 100%   BMI 29.95 kg/m  Filed Weights   06/11/22 1331  Weight: 191 lb 3.2 oz (86.7 kg)    Estimated body mass index is 29.95 kg/m as calculated from the following:   Height as of 05/29/22: _0  (1.702 m).   Weight as of this encounter: 191 lb 3.2 oz (86.7 kg).  LABS: CBC:    Component Value Date/Time   WBC 17.8 (H) 06/11/2022 1311   HGB 11.9 (L) 06/11/2022 1311   HGB 14.4 03/14/2015 0856   HCT 35.8 (L) 06/11/2022 1311   HCT 41.6 03/14/2015 0856   PLT 187 06/11/2022 1311   PLT 189 03/14/2015 0856   MCV 95.2 06/11/2022 1311   MCV 94 03/14/2015 0856   NEUTROABS 14.0 (H) 06/11/2022 1311   NEUTROABS 4.7 03/14/2015 0856   LYMPHSABS 1.0 06/11/2022 1311   LYMPHSABS 2.0 03/14/2015 0856    MONOABS 1.0 06/11/2022 1311   MONOABS 0.6 03/14/2015 0856   EOSABS 0.1 06/11/2022 1311   EOSABS 0.3 03/14/2015 0856   BASOSABS 0.1 06/11/2022 1311   BASOSABS 0.1 03/14/2015 0856   Comprehensive Metabolic Panel:  Component Value Date/Time   NA 134 (L) 06/11/2022 1311   NA 139 03/14/2015 0856   K 3.8 06/11/2022 1311   K 4.1 03/14/2015 0856   CL 102 06/11/2022 1311   CL 103 03/14/2015 0856   CO2 27 06/11/2022 1311   CO2 28 03/14/2015 0856   BUN 15 06/11/2022 1311   BUN 19 03/14/2015 0856   CREATININE 0.44 06/11/2022 1311   CREATININE 0.56 03/14/2015 0856   GLUCOSE 116 (H) 06/11/2022 1311   GLUCOSE 111 (H) 03/14/2015 0856   CALCIUM 8.6 (L) 06/11/2022 1311   CALCIUM 9.3 03/14/2015 0856   AST 58 (H) 06/11/2022 1311   AST 80 (H) 03/14/2015 0856   ALT 53 (H) 06/11/2022 1311   ALT 87 (H) 03/14/2015 0856   ALKPHOS 163 (H) 06/11/2022 1311   ALKPHOS 164 (H) 03/14/2015 0856   BILITOT 0.5 06/11/2022 1311   BILITOT 0.8 03/14/2015 0856   PROT 7.2 06/11/2022 1311   PROT 7.7 03/14/2015 0856   ALBUMIN 3.7 06/11/2022 1311   ALBUMIN 4.3 03/14/2015 0856    RADIOGRAPHIC STUDIES: No results found.  PERFORMANCE STATUS (ECOG) : 2 - Symptomatic, <50% confined to bed  Review of Systems Unless otherwise noted, a complete review of systems is negative.  Physical Exam General: NAD Cardiovascular: regular rate and rhythm Pulmonary: clear ant fields Abdomen: soft, nontender, + bowel sounds GU: no suprapubic tenderness Extremities: no edema, no joint deformities Skin: no rashes Neurological: Weakness but otherwise nonfocal  Assessment and Plan- Patient is a 71 y.o. female    Encounter Diagnoses  Name Primary?   Constipation, unspecified constipation type Yes   Dehydration    Diffuse large B-cell lymphoma of lymph nodes of neck (HCC)    New. 1L IVF fluids today as this has helped her in the past and with reduced oral hydration. We are going to have her reduce her anti-nausea  medications down to one as she is not having any nausea and this may be confounding her symptoms. She will continue her Miralax 1-2 times per day, switch to stool softener 1-2 times per day and continue her stool softener once daily. She will try to increase her oral intake of liquids and fiber, try to get more walking in and alert [place] if she has any red flag signs or symptoms. She has follow up next week which she will keep.    Patient expressed understanding and was in agreement with this plan. She also understands that She can call clinic at any time with any questions, concerns, or complaints.   Thank you for allowing me to participate in the care of this very pleasant patient.   Time Total: 25  Visit consisted of counseling and education dealing with the complex and emotionally intense issues of symptom management in the setting of serious illness.Greater than 50%  of this time was spent counseling and coordinating care related to the above assessment and plan.  Signed by: Nelwyn Salisbury, PA-C

## 2022-06-11 NOTE — Telephone Encounter (Signed)
Good Morning, Sharee Pimple Storms called in regards to Julie Jennings stating that the patient is extremely fatigued (Doesn't want to get out of the bed, Doesn't want to go anywhere), Patient is constipated, and can not sleep.She had IVF on 6/27.  Ms.strom stated Mrs.Lejeune  is taking her Nausea medication periodically  just so she doesn't get sick and she believe this is why she is constipated. She is giving her Mirilax but it is not working and is wondering what other medication she can take. She Wants to know if it is to soon for IVF again.  Please call back Ms. Storm @ [phone].

## 2022-06-18 ENCOUNTER — Inpatient Hospital Stay (HOSPITAL_BASED_OUTPATIENT_CLINIC_OR_DEPARTMENT_OTHER): Payer: Medicare PPO | Admitting: Internal Medicine

## 2022-06-18 ENCOUNTER — Encounter: Payer: Self-pay | Admitting: Internal Medicine

## 2022-06-18 ENCOUNTER — Inpatient Hospital Stay: Payer: Medicare PPO

## 2022-06-18 ENCOUNTER — Other Ambulatory Visit: Payer: Medicare PPO

## 2022-06-18 ENCOUNTER — Ambulatory Visit: Payer: Medicare PPO | Admitting: Nurse Practitioner

## 2022-06-18 ENCOUNTER — Ambulatory Visit: Payer: Medicare PPO

## 2022-06-18 DIAGNOSIS — C8590 Non-Hodgkin lymphoma, unspecified, unspecified site: Secondary | ICD-10-CM

## 2022-06-18 DIAGNOSIS — C8331 Diffuse large B-cell lymphoma, lymph nodes of head, face, and neck: Secondary | ICD-10-CM

## 2022-06-18 DIAGNOSIS — Z5112 Encounter for antineoplastic immunotherapy: Secondary | ICD-10-CM | POA: Diagnosis not present

## 2022-06-18 LAB — CBC WITH DIFFERENTIAL/PLATELET
Abs Immature Granulocytes: 0.07 10*3/uL (ref 0.00–0.07)
Basophils Absolute: 0.1 10*3/uL (ref 0.0–0.1)
Basophils Relative: 1 %
Eosinophils Absolute: 0.1 10*3/uL (ref 0.0–0.5)
Eosinophils Relative: 1 %
HCT: 38.1 % (ref 36.0–46.0)
Hemoglobin: 12.6 g/dL (ref 12.0–15.0)
Immature Granulocytes: 1 %
Lymphocytes Relative: 6 %
Lymphs Abs: 0.5 10*3/uL — ABNORMAL LOW (ref 0.7–4.0)
MCH: 32.1 pg (ref 26.0–34.0)
MCHC: 33.1 g/dL (ref 30.0–36.0)
MCV: 97.2 fL (ref 80.0–100.0)
Monocytes Absolute: 0.6 10*3/uL (ref 0.1–1.0)
Monocytes Relative: 8 %
Neutro Abs: 6.9 10*3/uL (ref 1.7–7.7)
Neutrophils Relative %: 83 %
Platelets: 168 10*3/uL (ref 150–400)
RBC: 3.92 MIL/uL (ref 3.87–5.11)
RDW: 15.9 % — ABNORMAL HIGH (ref 11.5–15.5)
WBC: 8.3 10*3/uL (ref 4.0–10.5)
nRBC: 0 % (ref 0.0–0.2)

## 2022-06-18 LAB — COMPREHENSIVE METABOLIC PANEL
ALT: 54 U/L — ABNORMAL HIGH (ref 0–44)
AST: 68 U/L — ABNORMAL HIGH (ref 15–41)
Albumin: 3.8 g/dL (ref 3.5–5.0)
Alkaline Phosphatase: 130 U/L — ABNORMAL HIGH (ref 38–126)
Anion gap: 6 (ref 5–15)
BUN: 17 mg/dL (ref 8–23)
CO2: 30 mmol/L (ref 22–32)
Calcium: 9.3 mg/dL (ref 8.9–10.3)
Chloride: 99 mmol/L (ref 98–111)
Creatinine, Ser: 0.67 mg/dL (ref 0.44–1.00)
GFR, Estimated: >60 mL/min (ref >60)
Glucose, Bld: 122 mg/dL — ABNORMAL HIGH (ref 70–99)
Potassium: 4.1 mmol/L (ref 3.5–5.1)
Sodium: 135 mmol/L (ref 135–145)
Total Bilirubin: 0.6 mg/dL (ref 0.3–1.2)
Total Protein: 7.3 g/dL (ref 6.5–8.1)

## 2022-06-18 LAB — LACTATE DEHYDROGENASE: LDH: 159 U/L (ref 98–192)

## 2022-06-18 MED ORDER — PREDNISONE 50 MG PO TABS: ORAL_TABLET | ORAL | 1 refills | Status: DC

## 2022-06-18 MED ORDER — VENLAFAXINE HCL ER 75 MG PO CP24: 75.0000 mg | ORAL_CAPSULE | Freq: Two times a day (BID) | ORAL | 1 refills | Status: DC

## 2022-06-18 MED ORDER — MELOXICAM 15 MG PO TABS: 15.0000 mg | ORAL_TABLET | Freq: Every day | ORAL | 0 refills | Status: DC

## 2022-06-18 MED ORDER — ONDANSETRON HCL 8 MG PO TABS: ORAL_TABLET | ORAL | 2 refills | Status: DC

## 2022-06-18 NOTE — Assessment & Plan Note (Addendum)
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  Clinically stage II. Currently on R-CEOP chemotherapy every 3 weeks x 3 cycles-followed by involved field radiation. Awaiting to start RT- July after cycle #3.    # proceed with cycle # 3 of R-CEOP Labs today reviewed;  acceptable for treatment- 7/11-7/12- 7/13.  MAY 2023- LEFT ventricular ejection fraction of 64% with normal LV wall motion.  S/p  Dr. Donella Stade radiation. We will plan to get a PET scan after finishing up treatment/radiation  # Fatigue-status post IV fluids: STABLE.   # Slightly intermittent elevated LFts-?  Fatty liver.  Hepatitis panel normal.- STABLE.   # prophylaxis Shingles: start Acyclovir prophylaxis   # Skin rash- left foot- sec to dry skin/deramtitis from chemo- recommend continued kenalog ointment.   #Hypomagnesemia: June-magnesium 1.4; magnesium supplementation July 2 0.0.  *D-1;D-2 chemo  # DISPOSITION: # Chemo as planned this week # 1 week- cbc/bmp; possible IVFs over 1 hour # 2 weeks- cbc/bmp; possible IVFs over 1 hour # follow up in 3 weeks NP- cbc/cmp/ldh; NO chemo # follow up in 6  weeks NP- cbc/cmp/ldh;NO chemo- Dr.B

## 2022-06-18 NOTE — Progress Notes (Signed)
Patient was given 8 tablets of prednisone instead of the 10 tablets prescribed because of two extra tablets given in previous dose. Patient states she does not have the two extra from last prescription because she accidentally took them last prescription.

## 2022-06-18 NOTE — Progress Notes (Signed)
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 Biopsy is suggestive of fatty liver changes  # carcinoma of lung status post left upper lobe resection in July of 2014; Adenocarcinoma T1N0 M0 tumor EGFR positive  SURGICAL PATHOLOGY  CASE: [id number]  PATIENT: Julie Jennings  Surgical Pathology Report      Specimen Submitted:  A. Lymph node, left neck   Clinical History: New left cervical lymphadenopathy.  History of  lymphoma and lung cancer.  New lymphadenopathy    MAY 17th, 2023- [Dr.juengle]; A. LYMPH NODE, LEFT NECK; ULTRASOUND-GUIDED BIOPSY:  - FINDINGS COMPATIBLE WITH LARGE B-CELL LYMPHOMA.   Comment:  Core biopsy sections display lymphoid tissue with a somewhat effaced  immunoarchitecture.  Portions of nodal tissue are comprised of sheets of  small lymphocytes, while other areas display dense background fibrosis,  and aggregates of large abnormal lymphocytes with irregular nuclear  contours, open chromatin, visible nucleoli, and retraction artifact.   Immunohistochemical studies demonstrate diffuse positivity for CD20  within regions comprised of larger lymphocytes, compatible with B cells.  CD3 highlights a background of small T cells.  CK AE1/AE3 is negative  for metastatic carcinoma.  PAX5 displays dim, patchy marking of larger  abnormal B cells.  In addition, these B cells appear to display dim  expression of CD10, with  positivity for Bcl-2, BCL6, and Mum-1.  B cells  are negative for CD5.  CD30 displays increased marking.  C-Myc is  positive, staining greater than 40% of larger cells.  CD45 (LCA) is  diffusely positive, without aberrant loss of expression.   Concurrent flow cytometric studies demonstrate a CD10 positive  monoclonal B-cell population in a background of many polytypic B cells,  representing 3% of total viable lymphoid cells and 8% of B cells.  For  further details, see scanned report in [facility].   The patient's history is of diffuse large B-cell lymphoma, as well as  invasive adenocarcinoma of the lung are noted.  Biopsy sections  demonstrate an abnormal proliferation of large B cells, compatible with  involvement by a diffuse large B-cell lymphoma. Further  subclassification is difficult, secondary to limited tissue, however,  presence of C10 marking would suggest a germinal center immunophenotype.  In addition, there does appear to be expression of both Bcl-2 and c-myc,  which may suggest a more aggressive process. There is no evidence of  metastatic adenocarcinoma. FISH testing for prognostically significant  abnormalities of BCL2, BCL6, and MYC will be attempted, and reported as  an addendum.   IMPRESSION: 1. Enlarged hypermetabolic lymph nodes in the bilateral left greater than right neck and asymmetric right palatine tonsil hypermetabolism with associated soft tissue fullness on the CT images, all new since 2014 PET-CT, most compatible with recurrent lymphoma. Deauville category 5.  # MAY 12th, 2023- DLBCL- STAGE II [NO Bone marrow]; GCB; double expresser-mcy; Bcl-2; QNS-FISH.   # MAY 30th, 2023- RCEOP q 3 W x3-RT   Cancer of upper lobe of left lung ([place])  Diffuse large B-cell lymphoma of lymph nodes of neck ([place])  04/25/2022 Initial Diagnosis   Diffuse large B-cell lymphoma of lymph nodes of neck ([place])   04/26/2022 Cancer Staging   Staging form: Hodgkin and Non-Hodgkin  Lymphoma, AJCC 8th Edition - Clinical: Stage II - Signed by Cammie Sickle, MD on 04/26/2022 Histopathologic type: Malignant lymphoma, large B-cell, diffuse, NOS   05/08/2022 -  Chemotherapy   [name]       INTERVAL HISTORY: Patient is accompanied by her cousin [name].  She is ambulating independently.  Julie Jennings 71 y.o.  female pleasant patient with extreme anxiety; and diffuse large B cell lymphoma [prior history of 2005 DLCBL]  is here to proceed chemotherapy.   Patient currently status post R-CEOP cycle #2 approximately 3 weeks ago.  Patient was seen twice symptom management clinic for fluids/fatigue.  Patient denies any worsening enlargement of her lymph nodes.  Notes to have improvement of the lymph nodes. Weight is stable.  No nausea no vomiting.  No new lumps or bumps.  No night sweats.  Complains of skin rash left foot.  Not itchy.  Review of Systems  Constitutional:  Negative for chills, diaphoresis, fever, malaise/fatigue and weight loss.  HENT:  Negative for nosebleeds and sore throat.   Eyes:  Negative for double vision.  Respiratory:  Negative for cough, hemoptysis, sputum production, shortness of breath and wheezing.   Cardiovascular:  Negative for chest pain, palpitations, orthopnea and leg swelling.  Gastrointestinal:  Negative for abdominal pain, blood in stool, constipation, diarrhea, heartburn, melena, nausea and vomiting.  Musculoskeletal:  Positive for joint pain. Negative for back pain.  Skin: Negative.  Negative for itching and rash.  Neurological:  Negative for dizziness, tingling, focal weakness, weakness and headaches.  Endo/Heme/Allergies:  Does not bruise/bleed easily.  Psychiatric/Behavioral:  Negative for depression. The patient is nervous/anxious. The patient does not have insomnia.      PAST MEDICAL HISTORY :  Past Medical History:  Diagnosis Date   A-fib ([place])    Only  once   Anxiety    Arthritis    oesteoarthritis   BP (high blood pressure) 04/16/2014   Chronic kidney disease    history nephrolithiasis   Depression    Dysrhythmia    PSVT   Endometriosis    Herpes zoster    History of kidney stones    Lung cancer ([place]) left   Lymphoma ([place])    "stomach"   Stroke ([place])     PAST SURGICAL HISTORY :   Past Surgical History:  Procedure Laterality Date   AUGMENTATION MAMMAPLASTY Bilateral    CHOLECYSTECTOMY     COLONOSCOPY     COLONOSCOPY WITH PROPOFOL N/A 04/14/2018   Procedure: COLONOSCOPY WITH PROPOFOL;  Surgeon: Manya Silvas, MD;  Location: [facility] ENDOSCOPY;  Service: Endoscopy;  Laterality: N/A;   DILATION AND CURETTAGE OF UTERUS     HEMORRHOIDECTOMY WITH HEMORRHOID BANDING     HERNIA REPAIR     umbilical hernia   IR IMAGING GUIDED PORT INSERTION  05/04/2022   LUNG REMOVAL, PARTIAL Left    REVERSE SHOULDER ARTHROPLASTY Left 01/04/2022   Procedure: Left reverse shoulder arthroplasty, biceps tenodesis;  Surgeon: Leim Fabry, MD;  Location: [facility] ORS;  Service: Orthopedics;  Laterality: Left;    FAMILY HISTORY :   Family History  Problem Relation Age of Onset   Stroke Mother    Diabetes Mother    Colon  cancer Father    Prostate cancer Father    Breast cancer Neg Hx     SOCIAL HISTORY:   Social History   Tobacco Use   Smoking status: Never   Smokeless tobacco: Never  Vaping Use   Vaping Use: Never used  Substance Use Topics   Alcohol use: No   Drug use: No    ALLERGIES:  is allergic to tylenol [acetaminophen], buspirone, and codeine.  MEDICATIONS:  Current Outpatient Medications  Medication Sig Dispense Refill   acyclovir (ZOVIRAX) 400 MG tablet Take 1 tablet (400 mg total) by mouth 2 (two) times daily. 60 tablet 4   ALPRAZolam (XANAX) 0.5 MG tablet Take 1 tablet (0.5 mg total) by mouth 3 (three) times daily as needed for anxiety. (Patient taking differently: Take 0.5 mg by mouth 3 (three) times daily.) 21 tablet 0    aspirin 81 MG chewable tablet Chew by mouth.     furosemide (LASIX) 40 MG tablet Take 40 mg by mouth daily.     lidocaine-prilocaine (EMLA) cream Apply on the port. 30 -45 min  prior to port access. 30 g 3   loratadine (CLARITIN) 10 MG tablet Take 10 mg by mouth daily as needed for allergies.     losartan (COZAAR) 100 MG tablet Take 100 mg by mouth daily.     Magnesium Cl-Calcium Carbonate (SLOW MAGNESIUM/CALCIUM) 70-117 MG TBEC Take 1 tablet by mouth 2 (two) times daily. 60 tablet 6   nebivolol (BYSTOLIC) 10 MG tablet Take 10 mg by mouth daily.     ondansetron (ZOFRAN) 4 MG tablet Take 1 tablet (4 mg total) by mouth every 6 (six) hours as needed for nausea. 20 tablet 0   oxyCODONE (OXY IR/ROXICODONE) 5 MG immediate release tablet Take 1 tablet (5 mg total) by mouth every 4 (four) hours as needed for moderate pain (pain score 4-6). 30 tablet 0   pantoprazole (PROTONIX) 40 MG tablet Take 40 mg by mouth 2 (two) times daily.     Polyethyl Glycol-Propyl Glycol (SYSTANE) 0.4-0.3 % SOLN Place 1 drop into both eyes daily as needed (Dry eye).     prochlorperazine (COMPAZINE) 10 MG tablet Take 1 tablet (10 mg total) by mouth every 6 (six) hours as needed for nausea or vomiting. 40 tablet 1   tiotropium (SPIRIVA) 18 MCG inhalation capsule Place 18 mcg into inhaler and inhale daily.     triamcinolone (NASACORT) 55 MCG/ACT AERO nasal inhaler Place 2 sprays into the nose daily.     ketoconazole (NIZORAL) 2 % cream Apply to the feet QHS (Patient not taking: Reported on 06/11/2022) 60 g 3   meloxicam (MOBIC) 15 MG tablet Take 1 tablet (15 mg total) by mouth daily. 30 tablet 0   ondansetron (ZOFRAN) 8 MG tablet Every 8 hours as needed for nausea/vomitting. 40 tablet 2   predniSONE (DELTASONE) 50 MG tablet Once a day; Take in AM; with FOOD. 2 tablet 1   venlafaxine XR (EFFEXOR-XR) 75 MG 24 hr capsule Take 1 capsule (75 mg total) by mouth 2 (two) times daily. 30 capsule 1   No current facility-administered  medications for this visit.    PHYSICAL EXAMINATION: ECOG PERFORMANCE STATUS: 0 - Asymptomatic  BP 123/84   Pulse 86   Temp 98.7 F (37.1 C)   Resp 20   Wt 191 lb 3.2 oz (86.7 kg)   SpO2 98%   BMI 29.95 kg/m   Filed Weights   06/18/22 1036  Weight: 191 lb 3.2 oz (86.7  kg)    Left neck lymphadenopathy 2 to 3 cm in size noted.  Nontender.  Physical Exam HENT:     Head: Normocephalic and atraumatic.     Mouth/Throat:     Pharynx: No oropharyngeal exudate.  Eyes:     Pupils: Pupils are equal, round, and reactive to light.  Cardiovascular:     Rate and Rhythm: Normal rate and regular rhythm.  Pulmonary:     Effort: Pulmonary effort is normal. No respiratory distress.     Breath sounds: Normal breath sounds. No wheezing.  Abdominal:     General: Bowel sounds are normal. There is no distension.     Palpations: Abdomen is soft. There is no mass.     Tenderness: There is no abdominal tenderness. There is no guarding or rebound.  Musculoskeletal:        General: No tenderness. Normal range of motion.     Cervical back: Normal range of motion and neck supple.  Skin:    General: Skin is warm.  Neurological:     Mental Status: She is alert and oriented to person, place, and time.  Psychiatric:        Mood and Affect: Affect normal.      LABORATORY DATA:  I have reviewed the data as listed    Component Value Date/Time   NA 135 06/18/2022 1022   NA 139 03/14/2015 0856   K 4.1 06/18/2022 1022   K 4.1 03/14/2015 0856   CL 99 06/18/2022 1022   CL 103 03/14/2015 0856   CO2 30 06/18/2022 1022   CO2 28 03/14/2015 0856   GLUCOSE 122 (H) 06/18/2022 1022   GLUCOSE 111 (H) 03/14/2015 0856   BUN 17 06/18/2022 1022   BUN 19 03/14/2015 0856   CREATININE 0.67 06/18/2022 1022   CREATININE 0.56 03/14/2015 0856   CALCIUM 9.3 06/18/2022 1022   CALCIUM 9.3 03/14/2015 0856   PROT 7.3 06/18/2022 1022   PROT 7.7 03/14/2015 0856   ALBUMIN 3.8 06/18/2022 1022   ALBUMIN 4.3  03/14/2015 0856   AST 68 (H) 06/18/2022 1022   AST 80 (H) 03/14/2015 0856   ALT 54 (H) 06/18/2022 1022   ALT 87 (H) 03/14/2015 0856   ALKPHOS 130 (H) 06/18/2022 1022   ALKPHOS 164 (H) 03/14/2015 0856   BILITOT 0.6 06/18/2022 1022   BILITOT 0.8 03/14/2015 0856   GFRNONAA >60 06/18/2022 1022   GFRNONAA >60 03/14/2015 0856   GFRAA >60 07/07/2020 1041   GFRAA >60 03/14/2015 0856    No results found for: "SPEP", "UPEP"  Lab Results  Component Value Date   WBC 8.3 06/18/2022   NEUTROABS 6.9 06/18/2022   HGB 12.6 06/18/2022   HCT 38.1 06/18/2022   MCV 97.2 06/18/2022   PLT 168 06/18/2022      Chemistry      Component Value Date/Time   NA 135 06/18/2022 1022   NA 139 03/14/2015 0856   K 4.1 06/18/2022 1022   K 4.1 03/14/2015 0856   CL 99 06/18/2022 1022   CL 103 03/14/2015 0856   CO2 30 06/18/2022 1022   CO2 28 03/14/2015 0856   BUN 17 06/18/2022 1022   BUN 19 03/14/2015 0856   CREATININE 0.67 06/18/2022 1022   CREATININE 0.56 03/14/2015 0856      Component Value Date/Time   CALCIUM 9.3 06/18/2022 1022   CALCIUM 9.3 03/14/2015 0856   ALKPHOS 130 (H) 06/18/2022 1022   ALKPHOS 164 (H) 03/14/2015 0856   AST 68 (  H) 06/18/2022 1022   AST 80 (H) 03/14/2015 0856   ALT 54 (H) 06/18/2022 1022   ALT 87 (H) 03/14/2015 0856   BILITOT 0.6 06/18/2022 1022   BILITOT 0.8 03/14/2015 0856       RADIOGRAPHIC STUDIES: I have personally reviewed the radiological images as listed and agreed with the findings in the report. No results found.   ASSESSMENT & PLAN:  Diffuse large B-cell lymphoma of lymph nodes of neck (H[facility]) #Diffuse B-cell lymphoma-bilateral neck left more than right PET scan.  Clinically stage II. Currently on R-CEOP chemotherapy every 3 weeks x 3 cycles-followed by involved field radiation. Awaiting to start RT- July after cycle #3.   # proceed with cycle # 3 of R-CEOP Labs today reviewed;  acceptable for treatment- 7/11-7/12- 7/13.  MAY 2023- LEFT ventricular  ejection fraction of 64% with normal LV wall motion.  S/p  Dr. Donella Stade radiation.  # Fatigue-status post IV fluids: STABLE.   # Slightly intermittent elevated LFts-?  Fatty liver.  Hepatitis panel normal.- STABLE.   # prophylaxis Shingles: start Acyclovir prophylaxis   # Skin rash- left foot- sec to dry skin/deramtitis from chemo- recommend continued kenalog ointment.   #Hypomagnesemia: June-magnesium 1.4; magnesium supplementation July 2 0.0.  *D-1;D-2 chemo  # DISPOSITION: # Chemo as planned this week # 1 week- cbc/bmp; possible IVFs over 1 hour # 2 weeks- cbc/bmp; possible IVFs over 1 hour # follow up in 3 weeks NP- cbc/cmp/ldh; NO chemo # follow up in 6  weeks NP- cbc/cmp/ldh;NO chemo- Dr.B    Orders Placed This Encounter  Procedures   CBC with Differential/Platelet    Standing Status:   Future    Standing Expiration Date:   2/58/5277   Basic metabolic panel    Standing Status:   Future    Standing Expiration Date:   08/02/2352   Basic metabolic panel    Standing Status:   Future    Standing Expiration Date:   06/19/2023   CBC with Differential/Platelet    Standing Status:   Future    Standing Expiration Date:   06/19/2023   Lactate dehydrogenase    Standing Status:   Future    Standing Expiration Date:   06/19/2023   CBC with Differential/Platelet    Standing Status:   Future    Standing Expiration Date:   06/19/2023   Comprehensive metabolic panel    Standing Status:   Future    Standing Expiration Date:   06/19/2023   Lactate dehydrogenase    Standing Status:   Future    Standing Expiration Date:   06/19/2023   All questions were answered. The patient knows to call the clinic with any problems, questions or concerns.      Cammie Sickle, MD 06/18/2022 11:55 AM

## 2022-06-18 NOTE — Progress Notes (Signed)
Refill request for Zofran and Prednisone pended for MD review.  Patient also request refills on Venlafaxine, Meloxicam, and Losartan that have not been filled by Dr. Jacinto Reap.

## 2022-06-19 ENCOUNTER — Other Ambulatory Visit: Payer: Medicare PPO

## 2022-06-19 ENCOUNTER — Inpatient Hospital Stay: Payer: Medicare PPO

## 2022-06-19 ENCOUNTER — Ambulatory Visit: Payer: Medicare PPO | Admitting: Internal Medicine

## 2022-06-19 VITALS — BP 138/84 | HR 84 | Temp 97.8°F | Resp 18

## 2022-06-19 DIAGNOSIS — Z5112 Encounter for antineoplastic immunotherapy: Secondary | ICD-10-CM | POA: Diagnosis not present

## 2022-06-19 DIAGNOSIS — C8331 Diffuse large B-cell lymphoma, lymph nodes of head, face, and neck: Secondary | ICD-10-CM

## 2022-06-19 MED ORDER — SODIUM CHLORIDE 0.9 % IV SOLN: 375.0000 mg/m2 | Freq: Once | INTRAVENOUS | Status: DC

## 2022-06-19 MED ORDER — DIPHENHYDRAMINE HCL 25 MG PO CAPS
50.0000 mg | ORAL_CAPSULE | Freq: Once | ORAL | Status: AC
  Administered 2022-06-19: 50 mg via ORAL
  Filled 2022-06-19: qty 2

## 2022-06-19 MED ORDER — SODIUM CHLORIDE 0.9 % IV SOLN
375.0000 mg/m2 | Freq: Once | INTRAVENOUS | Status: AC
  Administered 2022-06-19: 800 mg via INTRAVENOUS
  Filled 2022-06-19: qty 50

## 2022-06-19 MED ORDER — PALONOSETRON HCL INJECTION 0.25 MG/5ML
0.2500 mg | Freq: Once | INTRAVENOUS | Status: AC
  Administered 2022-06-19: 0.25 mg via INTRAVENOUS
  Filled 2022-06-19: qty 5

## 2022-06-19 MED ORDER — SODIUM CHLORIDE 0.9% FLUSH
10.0000 mL | INTRAVENOUS | Status: DC | PRN
  Administered 2022-06-19: 10 mL
  Filled 2022-06-19: qty 10

## 2022-06-19 MED ORDER — SODIUM CHLORIDE 0.9 % IV SOLN
1500.0000 mg | Freq: Once | INTRAVENOUS | Status: AC
  Administered 2022-06-19: 1500 mg via INTRAVENOUS
  Filled 2022-06-19: qty 50

## 2022-06-19 MED ORDER — VINCRISTINE SULFATE CHEMO INJECTION 1 MG/ML
2.0000 mg | Freq: Once | INTRAVENOUS | Status: AC
  Administered 2022-06-19: 2 mg via INTRAVENOUS
  Filled 2022-06-19: qty 2

## 2022-06-19 MED ORDER — SODIUM CHLORIDE 0.9 % IV SOLN
Freq: Once | INTRAVENOUS | Status: AC
  Filled 2022-06-19: qty 250

## 2022-06-19 MED ORDER — SODIUM CHLORIDE 0.9 % IV SOLN
10.0000 mg | Freq: Once | INTRAVENOUS | Status: AC
  Administered 2022-06-19: 10 mg via INTRAVENOUS
  Filled 2022-06-19: qty 1

## 2022-06-19 MED ORDER — HEPARIN SOD (PORK) LOCK FLUSH 100 UNIT/ML IV SOLN
500.0000 [IU] | Freq: Once | INTRAVENOUS | Status: AC | PRN
  Administered 2022-06-19: 500 [IU]
  Filled 2022-06-19: qty 5

## 2022-06-19 MED ORDER — SODIUM CHLORIDE 0.9 % IV SOLN
50.0000 mg/m2 | Freq: Once | INTRAVENOUS | Status: AC
  Administered 2022-06-19: 100 mg via INTRAVENOUS
  Filled 2022-06-19: qty 5

## 2022-06-19 MED ORDER — ACETAMINOPHEN 325 MG PO TABS
650.0000 mg | ORAL_TABLET | Freq: Once | ORAL | Status: AC
  Administered 2022-06-19: 650 mg via ORAL
  Filled 2022-06-19: qty 2

## 2022-06-19 NOTE — Patient Instructions (Signed)
[facility] AT [place]  Discharge Instructions: Thank you for choosing [facility] to provide your oncology and hematology care.  If you have a lab appointment with the [facility], please go directly to the [facility] and check in at the registration area.  Wear comfortable clothing and clothing appropriate for easy access to any Portacath or PICC line.   We strive to give you quality time with your provider. You may need to reschedule your appointment if you arrive late (15 or more minutes).  Arriving late affects you and other patients whose appointments are after yours.  Also, if you miss three or more appointments without notifying the office, you may be dismissed from the clinic at the provider's discretion.      For prescription refill requests, have your pharmacy contact our office and allow 72 hours for refills to be completed.    Today you received the following chemotherapy and/or immunotherapy agents VINCRISTINE, CYTOXAN, ETOPOSIDE, RUXIENCE      To help prevent nausea and vomiting after your treatment, we encourage you to take your nausea medication as directed.  BELOW ARE SYMPTOMS THAT SHOULD BE REPORTED IMMEDIATELY: *FEVER GREATER THAN 100.4 F (38 C) OR HIGHER *CHILLS OR SWEATING *NAUSEA AND VOMITING THAT IS NOT CONTROLLED WITH YOUR NAUSEA MEDICATION *UNUSUAL SHORTNESS OF BREATH *UNUSUAL BRUISING OR BLEEDING *URINARY PROBLEMS (pain or burning when urinating, or frequent urination) *BOWEL PROBLEMS (unusual diarrhea, constipation, pain near the anus) TENDERNESS IN MOUTH AND THROAT WITH OR WITHOUT PRESENCE OF ULCERS (sore throat, sores in mouth, or a toothache) UNUSUAL RASH, SWELLING OR PAIN  UNUSUAL VAGINAL DISCHARGE OR ITCHING   Items with * indicate a potential emergency and should be followed up as soon as possible or go to the Emergency Department if any problems should occur.  Please show the CHEMOTHERAPY ALERT CARD or  IMMUNOTHERAPY ALERT CARD at check-in to the Emergency Department and triage nurse.  Should you have questions after your visit or need to cancel or reschedule your appointment, please contact [facility] [place] [facility] [place]  [phone] and follow the prompts.  Office hours are 8:00 a.m. to 4:30 p.m. Monday - Friday. Please note that voicemails left after 4:00 p.m. may not be returned until the following business day.  We are closed weekends and major holidays. You have access to a nurse at all times for urgent questions. Please call the main number to the clinic [phone] and follow the prompts.  For any non-urgent questions, you may also contact your provider using MyChart. We now offer e-Visits for anyone 68 and older to request care online for non-urgent symptoms. For details visit [email or web address].   Also download the MyChart app! Go to the app store, search "MyChart", open the app, select [facility], and log in with your MyChart username and password.  Masks are optional in the cancer centers. If you would like for your care team to wear a mask while they are taking care of you, please let them know. For doctor visits, patients may have with them one support person who is at least 73 years old. At this time, visitors are not allowed in the infusion area.  Vincristine injection What is this medication? VINCRISTINE (vin KRIS teen) is a chemotherapy drug. It slows the growth of cancer cells. This medicine is used to treat many types of cancer like Hodgkin's disease, leukemia, non-Hodgkin's lymphoma, neuroblastoma (brain cancer), rhabdomyosarcoma, and Wilms' tumor. This medicine may be used for other purposes; ask  your health care provider or pharmacist if you have questions. COMMON BRAND NAME(S): Oncovin, Vincasar PFS What should I tell my care team before I take this medication? They need to know if you have any of these conditions: blood disorders gout infection  (especially chickenpox, cold sores, or herpes) kidney disease liver disease lung disease nervous system disease like Charcot-Marie-Tooth (CMT) recent or ongoing radiation therapy an unusual or allergic reaction to vincristine, other chemotherapy agents, other medicines, foods, dyes, or preservatives pregnant or trying to get pregnant breast-feeding How should I use this medication? This drug is given as an infusion into a vein. It is administered in a hospital or clinic by a specially trained health care professional. If you have pain, swelling, burning, or any unusual feeling around the site of your injection, tell your health care professional right away. Talk to your pediatrician regarding the use of this medicine in children. While this drug may be prescribed for selected conditions, precautions do apply. Overdosage: If you think you have taken too much of this medicine contact a poison control center or emergency room at once. NOTE: This medicine is only for you. Do not share this medicine with others. What if I miss a dose? It is important not to miss your dose. Call your doctor or health care professional if you are unable to keep an appointment. What may interact with this medication? certain medicines for fungal infections like itraconazole, ketoconazole, posaconazole, voriconazole certain medicines for seizures like phenytoin This list may not describe all possible interactions. Give your health care provider a list of all the medicines, herbs, non-prescription drugs, or dietary supplements you use. Also tell them if you smoke, drink alcohol, or use illegal drugs. Some items may interact with your medicine. What should I watch for while using this medication? This drug may make you feel generally unwell. This is not uncommon, as chemotherapy can affect healthy cells as well as cancer cells. Report any side effects. Continue your course of treatment even though you feel ill unless your  doctor tells you to stop. You may need blood work done while you are taking this medicine. This medicine will cause constipation. Try to have a bowel movement at least every 2 to 3 days. If you do not have a bowel movement for 3 days, call your doctor or health care professional. In some cases, you may be given additional medicines to help with side effects. Follow all directions for their use. Do not become pregnant while taking this medicine. Women should inform their doctor if they wish to become pregnant or think they might be pregnant. There is a potential for serious side effects to an unborn child. Talk to your health care professional or pharmacist for more information. Do not breast-feed an infant while taking this medicine. This medicine may make it more difficult to get pregnant or to father a child. Talk to your healthcare professional if you are concerned about your fertility. What side effects may I notice from receiving this medication? Side effects that you should report to your doctor or health care professional as soon as possible: allergic reactions like skin rash, itching or hives, swelling of the face, lips, or tongue breathing problems confusion or changes in emotions or moods constipation cough mouth sores muscle weakness nausea and vomiting pain, swelling, redness or irritation at the injection site pain, tingling, numbness in the hands or feet problems with balance, talking, walking seizures stomach pain trouble passing urine or change in the amount  of urine Side effects that usually do not require medical attention (report to your doctor or health care professional if they continue or are bothersome): diarrhea hair loss jaw pain loss of appetite This list may not describe all possible side effects. Call your doctor for medical advice about side effects. You may report side effects to FDA at 1-800-FDA-1088. Where should I keep my medication? This drug is given in  a hospital or clinic and will not be stored at home. NOTE: This sheet is a summary. It may not cover all possible information. If you have questions about this medicine, talk to your doctor, pharmacist, or health care provider.  2023 Elsevier/Gold Standard (2021-10-27 00:00:00)  Cyclophosphamide Injection What is this medication? CYCLOPHOSPHAMIDE (sye kloe FOSS fa mide) is a chemotherapy drug. It slows the growth of cancer cells. This medicine is used to treat many types of cancer like lymphoma, myeloma, leukemia, breast cancer, and ovarian cancer, to name a few. This medicine may be used for other purposes; ask your health care provider or pharmacist if you have questions. COMMON BRAND NAME(S): Cyclophosphamide, Cytoxan, Neosar What should I tell my care team before I take this medication? They need to know if you have any of these conditions: heart disease history of irregular heartbeat infection kidney disease liver disease low blood counts, like white cells, platelets, or red blood cells on hemodialysis recent or ongoing radiation therapy scarring or thickening of the lungs trouble passing urine an unusual or allergic reaction to cyclophosphamide, other medicines, foods, dyes, or preservatives pregnant or trying to get pregnant breast-feeding How should I use this medication? This drug is usually given as an injection into a vein or muscle or by infusion into a vein. It is administered in a hospital or clinic by a specially trained health care professional. Talk to your pediatrician regarding the use of this medicine in children. Special care may be needed. Overdosage: If you think you have taken too much of this medicine contact a poison control center or emergency room at once. NOTE: This medicine is only for you. Do not share this medicine with others. What if I miss a dose? It is important not to miss your dose. Call your doctor or health care professional if you are unable to  keep an appointment. What may interact with this medication? amphotericin B azathioprine certain antivirals for HIV or hepatitis certain medicines for blood pressure, heart disease, irregular heart beat certain medicines that treat or prevent blood clots like warfarin certain other medicines for cancer cyclosporine etanercept indomethacin medicines that relax muscles for surgery medicines to increase blood counts metronidazole This list may not describe all possible interactions. Give your health care provider a list of all the medicines, herbs, non-prescription drugs, or dietary supplements you use. Also tell them if you smoke, drink alcohol, or use illegal drugs. Some items may interact with your medicine. What should I watch for while using this medication? Your condition will be monitored carefully while you are receiving this medicine. You may need blood work done while you are taking this medicine. Drink water or other fluids as directed. Urinate often, even at night. Some products may contain alcohol. Ask your health care professional if this medicine contains alcohol. Be sure to tell all health care professionals you are taking this medicine. Certain medicines, like metronidazole and disulfiram, can cause an unpleasant reaction when taken with alcohol. The reaction includes flushing, headache, nausea, vomiting, sweating, and increased thirst. The reaction can last from 30  minutes to several hours. Do not become pregnant while taking this medicine or for 1 year after stopping it. Women should inform their health care professional if they wish to become pregnant or think they might be pregnant. Men should not father a child while taking this medicine and for 4 months after stopping it. There is potential for serious side effects to an unborn child. Talk to your health care professional for more information. Do not breast-feed an infant while taking this medicine or for 1 week after  stopping it. This medicine has caused ovarian failure in some women. This medicine may make it more difficult to get pregnant. Talk to your health care professional if you are concerned about your fertility. This medicine has caused decreased sperm counts in some men. This may make it more difficult to father a child. Talk to your health care professional if you are concerned about your fertility. Call your health care professional for advice if you get a fever, chills, or sore throat, or other symptoms of a cold or flu. Do not treat yourself. This medicine decreases your body's ability to fight infections. Try to avoid being around people who are sick. Avoid taking medicines that contain aspirin, acetaminophen, ibuprofen, naproxen, or ketoprofen unless instructed by your health care professional. These medicines may hide a fever. Talk to your health care professional about your risk of cancer. You may be more at risk for certain types of cancer if you take this medicine. If you are going to need surgery or other procedure, tell your health care professional that you are using this medicine. Be careful brushing or flossing your teeth or using a toothpick because you may get an infection or bleed more easily. If you have any dental work done, tell your dentist you are receiving this medicine. What side effects may I notice from receiving this medication? Side effects that you should report to your doctor or health care professional as soon as possible: allergic reactions like skin rash, itching or hives, swelling of the face, lips, or tongue breathing problems nausea, vomiting signs and symptoms of bleeding such as bloody or black, tarry stools; red or dark brown urine; spitting up blood or brown material that looks like coffee grounds; red spots on the skin; unusual bruising or bleeding from the eyes, gums, or nose signs and symptoms of heart failure like fast, irregular heartbeat, sudden weight gain;  swelling of the ankles, feet, hands signs and symptoms of infection like fever; chills; cough; sore throat; pain or trouble passing urine signs and symptoms of kidney injury like trouble passing urine or change in the amount of urine signs and symptoms of liver injury like dark yellow or brown urine; general ill feeling or flu-like symptoms; light-colored stools; loss of appetite; nausea; right upper belly pain; unusually weak or tired; yellowing of the eyes or skin Side effects that usually do not require medical attention (report to your doctor or health care professional if they continue or are bothersome): confusion decreased hearing diarrhea facial flushing hair loss headache loss of appetite missed menstrual periods signs and symptoms of low red blood cells or anemia such as unusually weak or tired; feeling faint or lightheaded; falls skin discoloration This list may not describe all possible side effects. Call your doctor for medical advice about side effects. You may report side effects to FDA at 1-800-FDA-1088. Where should I keep my medication? This drug is given in a hospital or clinic and will not be stored at  home. NOTE: This sheet is a summary. It may not cover all possible information. If you have questions about this medicine, talk to your doctor, pharmacist, or health care provider.  2023 Elsevier/Gold Standard (2021-10-27 00:00:00)  Etoposide, VP-16 injection What is this medication? ETOPOSIDE, VP-16 (e toe POE side) is a chemotherapy drug. It is used to treat testicular cancer, lung cancer, and other cancers. This medicine may be used for other purposes; ask your health care provider or pharmacist if you have questions. COMMON BRAND NAME(S): Etopophos, Toposar, VePesid What should I tell my care team before I take this medication? They need to know if you have any of these conditions: infection kidney disease liver disease low blood counts, like low white cell,  platelet, or red cell counts an unusual or allergic reaction to etoposide, other medicines, foods, dyes, or preservatives pregnant or trying to get pregnant breast-feeding How should I use this medication? This medicine is for infusion into a vein. It is administered in a hospital or clinic by a specially trained health care professional. Talk to your pediatrician regarding the use of this medicine in children. Special care may be needed. Overdosage: If you think you have taken too much of this medicine contact a poison control center or emergency room at once. NOTE: This medicine is only for you. Do not share this medicine with others. What if I miss a dose? It is important not to miss your dose. Call your doctor or health care professional if you are unable to keep an appointment. What may interact with this medication? This medicine may interact with the following medications: warfarin This list may not describe all possible interactions. Give your health care provider a list of all the medicines, herbs, non-prescription drugs, or dietary supplements you use. Also tell them if you smoke, drink alcohol, or use illegal drugs. Some items may interact with your medicine. What should I watch for while using this medication? Visit your doctor for checks on your progress. This drug may make you feel generally unwell. This is not uncommon, as chemotherapy can affect healthy cells as well as cancer cells. Report any side effects. Continue your course of treatment even though you feel ill unless your doctor tells you to stop. In some cases, you may be given additional medicines to help with side effects. Follow all directions for their use. Call your doctor or health care professional for advice if you get a fever, chills or sore throat, or other symptoms of a cold or flu. Do not treat yourself. This drug decreases your body's ability to fight infections. Try to avoid being around people who are  sick. This medicine may increase your risk to bruise or bleed. Call your doctor or health care professional if you notice any unusual bleeding. Talk to your doctor about your risk of cancer. You may be more at risk for certain types of cancers if you take this medicine. Do not become pregnant while taking this medicine or for at least 6 months after stopping it. Women should inform their doctor if they wish to become pregnant or think they might be pregnant. Women of child-bearing potential will need to have a negative pregnancy test before starting this medicine. There is a potential for serious side effects to an unborn child. Talk to your health care professional or pharmacist for more information. Do not breast-feed an infant while taking this medicine. Men must use a latex condom during sexual contact with a woman while taking this medicine  and for at least 4 months after stopping it. A latex condom is needed even if you have had a vasectomy. Contact your doctor right away if your partner becomes pregnant. Do not donate sperm while taking this medicine and for at least 4 months after you stop taking this medicine. Men should inform their doctors if they wish to father a child. This medicine may lower sperm counts. What side effects may I notice from receiving this medication? Side effects that you should report to your doctor or health care professional as soon as possible: allergic reactions like skin rash, itching or hives, swelling of the face, lips, or tongue low blood counts - this medicine may decrease the number of white blood cells, red blood cells, and platelets. You may be at increased risk for infections and bleeding nausea, vomiting redness, blistering, peeling or loosening of the skin, including inside the mouth signs and symptoms of infection like fever; chills; cough; sore throat; pain or trouble passing urine signs and symptoms of low red blood cells or anemia such as unusually weak  or tired; feeling faint or lightheaded; falls; breathing problems unusual bruising or bleeding Side effects that usually do not require medical attention (report to your doctor or health care professional if they continue or are bothersome): changes in taste diarrhea hair loss loss of appetite mouth sores This list may not describe all possible side effects. Call your doctor for medical advice about side effects. You may report side effects to FDA at 1-800-FDA-1088. Where should I keep my medication? This drug is given in a hospital or clinic and will not be stored at home. NOTE: This sheet is a summary. It may not cover all possible information. If you have questions about this medicine, talk to your doctor, pharmacist, or health care provider.  2023 Elsevier/Gold Standard (2021-10-27 00:00:00)  Rituximab Injection What is this medication? RITUXIMAB (ri TUX i mab) is a monoclonal antibody. It is used to treat certain types of cancer like non-Hodgkin lymphoma and chronic lymphocytic leukemia. It is also used to treat rheumatoid arthritis, granulomatosis with polyangiitis, microscopic polyangiitis, and pemphigus vulgaris. This medicine may be used for other purposes; ask your health care provider or pharmacist if you have questions. COMMON BRAND NAME(S): RIABNI, Rituxan, RUXIENCE, truxima What should I tell my care team before I take this medication? They need to know if you have any of these conditions: chest pain heart disease infection especially a viral infection such as chickenpox, cold sores, hepatitis B, or herpes immune system problems irregular heartbeat or rhythm kidney disease low blood counts (white cells, platelets, or red cells) lung disease recent or upcoming vaccine an unusual or allergic reaction to rituximab, other medicines, foods, dyes, or preservatives pregnant or trying to get pregnant breast-feeding How should I use this medication? This medicine is injected  into a vein. It is given by a health care provider in a hospital or clinic setting. A special MedGuide will be given to you before each treatment. Be sure to read this information carefully each time. Talk to your health care provider about the use of this medicine in children. While this drug may be prescribed for children as young as 6 months for selected conditions, precautions do apply. Overdosage: If you think you have taken too much of this medicine contact a poison control center or emergency room at once. NOTE: This medicine is only for you. Do not share this medicine with others. What if I miss a dose? Keep appointments  for follow-up doses. It is important not to miss your dose. Call your health care provider if you are unable to keep an appointment. What may interact with this medication? Do not take this medicine with any of the following medicines: live vaccines This medicine may also interact with the following medicines: cisplatin This list may not describe all possible interactions. Give your health care provider a list of all the medicines, herbs, non-prescription drugs, or dietary supplements you use. Also tell them if you smoke, drink alcohol, or use illegal drugs. Some items may interact with your medicine. What should I watch for while using this medication? Your condition will be monitored carefully while you are receiving this medicine. You may need blood work done while you are taking this medicine. This medicine can cause serious infusion reactions. To reduce the risk your health care provider may give you other medicines to take before receiving this one. Be sure to follow the directions from your health care provider. This medicine may increase your risk of getting an infection. Call your health care provider for advice if you get a fever, chills, sore throat, or other symptoms of a cold or flu. Do not treat yourself. Try to avoid being around people who are sick. Call your  health care provider if you are around anyone with measles, chickenpox, or if you develop sores or blisters that do not heal properly. Avoid taking medicines that contain aspirin, acetaminophen, ibuprofen, naproxen, or ketoprofen unless instructed by your health care provider. These medicines may hide a fever. This medicine may cause serious skin reactions. They can happen weeks to months after starting the medicine. Contact your health care provider right away if you notice fevers or flu-like symptoms with a rash. The rash may be red or purple and then turn into blisters or peeling of the skin. Or, you might notice a red rash with swelling of the face, lips or lymph nodes in your neck or under your arms. In some patients, this medicine may cause a serious brain infection that may cause death. If you have any problems seeing, thinking, speaking, walking, or standing, tell your healthcare professional right away. If you cannot reach your healthcare professional, urgently seek other source of medical care. Do not become pregnant while taking this medicine or for at least 12 months after stopping it. Women should inform their health care provider if they wish to become pregnant or think they might be pregnant. There is potential for serious harm to an unborn child. Talk to your health care provider for more information. Women should use a reliable form of birth control while taking this medicine and for 12 months after stopping it. Do not breast-feed while taking this medicine or for at least 6 months after stopping it. What side effects may I notice from receiving this medication? Side effects that you should report to your health care provider as soon as possible: allergic reactions (skin rash, itching or hives; swelling of the face, lips, or tongue) diarrhea edema (sudden weight gain; swelling of the ankles, feet, hands or other unusual swelling; trouble breathing) fast, irregular heartbeat heart attack  (trouble breathing; pain or tightness in the chest, neck, back or arms; unusually weak or tired) infection (fever, chills, cough, sore throat, pain or trouble passing urine) kidney injury (trouble passing urine or change in the amount of urine) liver injury (dark yellow or brown urine; general ill feeling or flu-like symptoms; loss of appetite, right upper belly pain; unusually weak or  tired, yellowing of the eyes or skin) low blood pressure (dizziness; feeling faint or lightheaded, falls; unusually weak or tired) low red blood cell counts (trouble breathing; feeling faint; lightheaded, falls; unusually weak or tired) mouth sores redness, blistering, peeling, or loosening of the skin, including inside the mouth stomach pain unusual bruising or bleeding wheezing (trouble breathing with loud or whistling sounds) vomiting Side effects that usually do not require medical attention (report to your health care provider if they continue or are bothersome): headache joint pain muscle cramps, pain nausea This list may not describe all possible side effects. Call your doctor for medical advice about side effects. You may report side effects to FDA at 1-800-FDA-1088. Where should I keep my medication? This medicine is given in a hospital or clinic. It will not be stored at home. NOTE: This sheet is a summary. It may not cover all possible information. If you have questions about this medicine, talk to your doctor, pharmacist, or health care provider.  2023 Elsevier/Gold Standard (2020-11-28 00:00:00)

## 2022-06-20 ENCOUNTER — Inpatient Hospital Stay: Payer: Medicare PPO

## 2022-06-20 VITALS — BP 141/85 | HR 87 | Temp 97.2°F | Resp 18

## 2022-06-20 DIAGNOSIS — Z5112 Encounter for antineoplastic immunotherapy: Secondary | ICD-10-CM | POA: Diagnosis not present

## 2022-06-20 DIAGNOSIS — C8331 Diffuse large B-cell lymphoma, lymph nodes of head, face, and neck: Secondary | ICD-10-CM

## 2022-06-20 MED ORDER — SODIUM CHLORIDE 0.9 % IV SOLN
Freq: Once | INTRAVENOUS | Status: AC
  Filled 2022-06-20: qty 250

## 2022-06-20 MED ORDER — HEPARIN SOD (PORK) LOCK FLUSH 100 UNIT/ML IV SOLN
500.0000 [IU] | Freq: Once | INTRAVENOUS | Status: AC | PRN
  Filled 2022-06-20: qty 5

## 2022-06-20 MED ORDER — SODIUM CHLORIDE 0.9 % IV SOLN
50.0000 mg/m2 | Freq: Once | INTRAVENOUS | Status: AC
  Administered 2022-06-20: 100 mg via INTRAVENOUS
  Filled 2022-06-20: qty 5

## 2022-06-20 MED ORDER — PROCHLORPERAZINE MALEATE 10 MG PO TABS
10.0000 mg | ORAL_TABLET | Freq: Once | ORAL | Status: AC
  Administered 2022-06-20: 10 mg via ORAL
  Filled 2022-06-20: qty 1

## 2022-06-20 MED ORDER — HEPARIN SOD (PORK) LOCK FLUSH 100 UNIT/ML IV SOLN
INTRAVENOUS | Status: AC
  Administered 2022-06-20: 500 [IU]
  Filled 2022-06-20: qty 5

## 2022-06-20 NOTE — Patient Instructions (Signed)
[facility] CANCER CTR AT [place]  Discharge Instructions: Thank you for choosing [facility] to provide your oncology and hematology care.  If you have a lab appointment with the [facility], please go directly to the [facility] and check in at the registration area.  Wear comfortable clothing and clothing appropriate for easy access to any Portacath or PICC line.   We strive to give you quality time with your provider. You may need to reschedule your appointment if you arrive late (15 or more minutes).  Arriving late affects you and other patients whose appointments are after yours.  Also, if you miss three or more appointments without notifying the office, you may be dismissed from the clinic at the provider's discretion.      For prescription refill requests, have your pharmacy contact our office and allow 72 hours for refills to be completed.    Today you received the following chemotherapy and/or immunotherapy agents Etoposide      To help prevent nausea and vomiting after your treatment, we encourage you to take your nausea medication as directed.  BELOW ARE SYMPTOMS THAT SHOULD BE REPORTED IMMEDIATELY: *FEVER GREATER THAN 100.4 F (38 C) OR HIGHER *CHILLS OR SWEATING *NAUSEA AND VOMITING THAT IS NOT CONTROLLED WITH YOUR NAUSEA MEDICATION *UNUSUAL SHORTNESS OF BREATH *UNUSUAL BRUISING OR BLEEDING *URINARY PROBLEMS (pain or burning when urinating, or frequent urination) *BOWEL PROBLEMS (unusual diarrhea, constipation, pain near the anus) TENDERNESS IN MOUTH AND THROAT WITH OR WITHOUT PRESENCE OF ULCERS (sore throat, sores in mouth, or a toothache) UNUSUAL RASH, SWELLING OR PAIN  UNUSUAL VAGINAL DISCHARGE OR ITCHING   Items with * indicate a potential emergency and should be followed up as soon as possible or go to the Emergency Department if any problems should occur.  Please show the CHEMOTHERAPY ALERT CARD or IMMUNOTHERAPY ALERT CARD at check-in to  the Emergency Department and triage nurse.  Should you have questions after your visit or need to cancel or reschedule your appointment, please contact [facility] CANCER [place] AT [place]  [phone] and follow the prompts.  Office hours are 8:00 a.m. to 4:30 p.m. Monday - Friday. Please note that voicemails left after 4:00 p.m. may not be returned until the following business day.  We are closed weekends and major holidays. You have access to a nurse at all times for urgent questions. Please call the main number to the clinic [phone] and follow the prompts.  For any non-urgent questions, you may also contact your provider using MyChart. We now offer e-Visits for anyone 34 and older to request care online for non-urgent symptoms. For details visit [email or web address].   Also download the MyChart app! Go to the app store, search "MyChart", open the app, select [facility], and log in with your MyChart username and password.  Masks are optional in the cancer centers. If you would like for your care team to wear a mask while they are taking care of you, please let them know. For doctor visits, patients may have with them one support person who is at least 73 years old. At this time, visitors are not allowed in the infusion area.

## 2022-06-21 ENCOUNTER — Inpatient Hospital Stay: Payer: Medicare PPO

## 2022-06-21 VITALS — BP 152/91 | HR 70 | Temp 97.8°F | Resp 18

## 2022-06-21 DIAGNOSIS — C8331 Diffuse large B-cell lymphoma, lymph nodes of head, face, and neck: Secondary | ICD-10-CM

## 2022-06-21 DIAGNOSIS — Z5112 Encounter for antineoplastic immunotherapy: Secondary | ICD-10-CM | POA: Diagnosis not present

## 2022-06-21 MED ORDER — SODIUM CHLORIDE 0.9 % IV SOLN
50.0000 mg/m2 | Freq: Once | INTRAVENOUS | Status: AC
  Administered 2022-06-21: 100 mg via INTRAVENOUS
  Filled 2022-06-21: qty 5

## 2022-06-21 MED ORDER — SODIUM CHLORIDE 0.9% FLUSH
10.0000 mL | INTRAVENOUS | Status: DC | PRN
  Administered 2022-06-21: 10 mL
  Filled 2022-06-21: qty 10

## 2022-06-21 MED ORDER — SODIUM CHLORIDE 0.9 % IV SOLN
Freq: Once | INTRAVENOUS | Status: AC
  Filled 2022-06-21: qty 250

## 2022-06-21 MED ORDER — HEPARIN SOD (PORK) LOCK FLUSH 100 UNIT/ML IV SOLN
500.0000 [IU] | Freq: Once | INTRAVENOUS | Status: DC | PRN
  Filled 2022-06-21: qty 5

## 2022-06-21 MED ORDER — PROCHLORPERAZINE MALEATE 10 MG PO TABS
10.0000 mg | ORAL_TABLET | Freq: Once | ORAL | Status: AC
  Administered 2022-06-21: 10 mg via ORAL
  Filled 2022-06-21: qty 1

## 2022-06-21 NOTE — Patient Instructions (Signed)
[facility] CANCER CTR AT [place]  Discharge Instructions: Thank you for choosing [facility] to provide your oncology and hematology care.  If you have a lab appointment with the [facility], please go directly to the [facility] and check in at the registration area.  Wear comfortable clothing and clothing appropriate for easy access to any Portacath or PICC line.   We strive to give you quality time with your provider. You may need to reschedule your appointment if you arrive late (15 or more minutes).  Arriving late affects you and other patients whose appointments are after yours.  Also, if you miss three or more appointments without notifying the office, you may be dismissed from the clinic at the provider's discretion.      For prescription refill requests, have your pharmacy contact our office and allow 72 hours for refills to be completed.    Today you received the following chemotherapy and/or immunotherapy agents ETOPOSIDE       To help prevent nausea and vomiting after your treatment, we encourage you to take your nausea medication as directed.  BELOW ARE SYMPTOMS THAT SHOULD BE REPORTED IMMEDIATELY: *FEVER GREATER THAN 100.4 F (38 C) OR HIGHER *CHILLS OR SWEATING *NAUSEA AND VOMITING THAT IS NOT CONTROLLED WITH YOUR NAUSEA MEDICATION *UNUSUAL SHORTNESS OF BREATH *UNUSUAL BRUISING OR BLEEDING *URINARY PROBLEMS (pain or burning when urinating, or frequent urination) *BOWEL PROBLEMS (unusual diarrhea, constipation, pain near the anus) TENDERNESS IN MOUTH AND THROAT WITH OR WITHOUT PRESENCE OF ULCERS (sore throat, sores in mouth, or a toothache) UNUSUAL RASH, SWELLING OR PAIN  UNUSUAL VAGINAL DISCHARGE OR ITCHING   Items with * indicate a potential emergency and should be followed up as soon as possible or go to the Emergency Department if any problems should occur.  Please show the CHEMOTHERAPY ALERT CARD or IMMUNOTHERAPY ALERT CARD at check-in to  the Emergency Department and triage nurse.  Should you have questions after your visit or need to cancel or reschedule your appointment, please contact [facility]CER [place] AT [place]  [phone] and follow the prompts.  Office hours are 8:00 a.m. to 4:30 p.m. Monday - Friday. Please note that voicemails left after 4:00 p.m. may not be returned until the following business day.  We are closed weekends and major holidays. You have access to a nurse at all times for urgent questions. Please call the main number to the clinic [phone] and follow the prompts.  For any non-urgent questions, you may also contact your provider using MyChart. We now offer e-Visits for anyone 32 and older to request care online for non-urgent symptoms. For details visit [email or web address].   Also download the MyChart app! Go to the app store, search "MyChart", open the app, select [place], and log in with your MyChart username and password.  Masks are optional in the cancer centers. If you would like for your care team to wear a mask while they are taking care of you, please let them know. For doctor visits, patients may have with them one support person who is at least 73 years old. At this time, visitors are not allowed in the infusion area.  Etoposide, VP-16 injection What is this medication? ETOPOSIDE, VP-16 (e toe POE side) is a chemotherapy drug. It is used to treat testicular cancer, lung cancer, and other cancers. This medicine may be used for other purposes; ask your health care provider or pharmacist if you have questions. COMMON BRAND NAME(S): Etopophos, Toposar, VePesid What  should I tell my care team before I take this medication? They need to know if you have any of these conditions: infection kidney disease liver disease low blood counts, like low white cell, platelet, or red cell counts an unusual or allergic reaction to etoposide, other medicines, foods, dyes, or  preservatives pregnant or trying to get pregnant breast-feeding How should I use this medication? This medicine is for infusion into a vein. It is administered in a hospital or clinic by a specially trained health care professional. Talk to your pediatrician regarding the use of this medicine in children. Special care may be needed. Overdosage: If you think you have taken too much of this medicine contact a poison control center or emergency room at once. NOTE: This medicine is only for you. Do not share this medicine with others. What if I miss a dose? It is important not to miss your dose. Call your doctor or health care professional if you are unable to keep an appointment. What may interact with this medication? This medicine may interact with the following medications: warfarin This list may not describe all possible interactions. Give your health care provider a list of all the medicines, herbs, non-prescription drugs, or dietary supplements you use. Also tell them if you smoke, drink alcohol, or use illegal drugs. Some items may interact with your medicine. What should I watch for while using this medication? Visit your doctor for checks on your progress. This drug may make you feel generally unwell. This is not uncommon, as chemotherapy can affect healthy cells as well as cancer cells. Report any side effects. Continue your course of treatment even though you feel ill unless your doctor tells you to stop. In some cases, you may be given additional medicines to help with side effects. Follow all directions for their use. Call your doctor or health care professional for advice if you get a fever, chills or sore throat, or other symptoms of a cold or flu. Do not treat yourself. This drug decreases your body's ability to fight infections. Try to avoid being around people who are sick. This medicine may increase your risk to bruise or bleed. Call your doctor or health care professional if you  notice any unusual bleeding. Talk to your doctor about your risk of cancer. You may be more at risk for certain types of cancers if you take this medicine. Do not become pregnant while taking this medicine or for at least 6 months after stopping it. Women should inform their doctor if they wish to become pregnant or think they might be pregnant. Women of child-bearing potential will need to have a negative pregnancy test before starting this medicine. There is a potential for serious side effects to an unborn child. Talk to your health care professional or pharmacist for more information. Do not breast-feed an infant while taking this medicine. Men must use a latex condom during sexual contact with a woman while taking this medicine and for at least 4 months after stopping it. A latex condom is needed even if you have had a vasectomy. Contact your doctor right away if your partner becomes pregnant. Do not donate sperm while taking this medicine and for at least 4 months after you stop taking this medicine. Men should inform their doctors if they wish to father a child. This medicine may lower sperm counts. What side effects may I notice from receiving this medication? Side effects that you should report to your doctor or health care professional  as soon as possible: allergic reactions like skin rash, itching or hives, swelling of the face, lips, or tongue low blood counts - this medicine may decrease the number of white blood cells, red blood cells, and platelets. You may be at increased risk for infections and bleeding nausea, vomiting redness, blistering, peeling or loosening of the skin, including inside the mouth signs and symptoms of infection like fever; chills; cough; sore throat; pain or trouble passing urine signs and symptoms of low red blood cells or anemia such as unusually weak or tired; feeling faint or lightheaded; falls; breathing problems unusual bruising or bleeding Side effects that  usually do not require medical attention (report to your doctor or health care professional if they continue or are bothersome): changes in taste diarrhea hair loss loss of appetite mouth sores This list may not describe all possible side effects. Call your doctor for medical advice about side effects. You may report side effects to FDA at 1-800-FDA-1088. Where should I keep my medication? This drug is given in a hospital or clinic and will not be stored at home. NOTE: This sheet is a summary. It may not cover all possible information. If you have questions about this medicine, talk to your doctor, pharmacist, or health care provider.  2023 Elsevier/Gold Standard (2021-10-27 00:00:00)

## 2022-06-22 ENCOUNTER — Inpatient Hospital Stay: Payer: Medicare PPO

## 2022-06-22 DIAGNOSIS — Z5112 Encounter for antineoplastic immunotherapy: Secondary | ICD-10-CM | POA: Diagnosis not present

## 2022-06-22 DIAGNOSIS — C8331 Diffuse large B-cell lymphoma, lymph nodes of head, face, and neck: Secondary | ICD-10-CM

## 2022-06-22 MED ORDER — PEGFILGRASTIM-CBQV 6 MG/0.6ML ~~LOC~~ SOSY
6.0000 mg | PREFILLED_SYRINGE | Freq: Once | SUBCUTANEOUS | Status: AC
  Administered 2022-06-22: 6 mg via SUBCUTANEOUS
  Filled 2022-06-22: qty 0.6

## 2022-06-23 ENCOUNTER — Other Ambulatory Visit: Payer: Self-pay | Admitting: Internal Medicine

## 2022-06-25 ENCOUNTER — Telehealth: Payer: Self-pay | Admitting: *Deleted

## 2022-06-25 ENCOUNTER — Encounter: Payer: Self-pay | Admitting: Internal Medicine

## 2022-06-25 ENCOUNTER — Inpatient Hospital Stay: Payer: Medicare PPO

## 2022-06-25 ENCOUNTER — Inpatient Hospital Stay (HOSPITAL_BASED_OUTPATIENT_CLINIC_OR_DEPARTMENT_OTHER): Payer: Medicare PPO | Admitting: Medical Oncology

## 2022-06-25 ENCOUNTER — Encounter: Payer: Self-pay | Admitting: Medical Oncology

## 2022-06-25 ENCOUNTER — Encounter: Payer: Medicare PPO | Admitting: Hospice and Palliative Medicine

## 2022-06-25 VITALS — BP 146/84 | HR 85 | Temp 97.2°F | Resp 18

## 2022-06-25 DIAGNOSIS — E860 Dehydration: Secondary | ICD-10-CM

## 2022-06-25 DIAGNOSIS — E876 Hypokalemia: Secondary | ICD-10-CM

## 2022-06-25 DIAGNOSIS — C8331 Diffuse large B-cell lymphoma, lymph nodes of head, face, and neck: Secondary | ICD-10-CM

## 2022-06-25 DIAGNOSIS — C8590 Non-Hodgkin lymphoma, unspecified, unspecified site: Secondary | ICD-10-CM

## 2022-06-25 DIAGNOSIS — F419 Anxiety disorder, unspecified: Secondary | ICD-10-CM | POA: Diagnosis not present

## 2022-06-25 DIAGNOSIS — Z5112 Encounter for antineoplastic immunotherapy: Secondary | ICD-10-CM | POA: Diagnosis not present

## 2022-06-25 LAB — COMPREHENSIVE METABOLIC PANEL
ALT: 48 U/L — ABNORMAL HIGH (ref 0–44)
AST: 41 U/L (ref 15–41)
Albumin: 3.6 g/dL (ref 3.5–5.0)
Alkaline Phosphatase: 122 U/L (ref 38–126)
Anion gap: 6 (ref 5–15)
BUN: 18 mg/dL (ref 8–23)
CO2: 28 mmol/L (ref 22–32)
Calcium: 9.1 mg/dL (ref 8.9–10.3)
Chloride: 102 mmol/L (ref 98–111)
Creatinine, Ser: 0.71 mg/dL (ref 0.44–1.00)
GFR, Estimated: >60 mL/min (ref >60)
Glucose, Bld: 171 mg/dL — ABNORMAL HIGH (ref 70–99)
Potassium: 2.8 mmol/L — ABNORMAL LOW (ref 3.5–5.1)
Sodium: 136 mmol/L (ref 135–145)
Total Bilirubin: 0.7 mg/dL (ref 0.3–1.2)
Total Protein: 6.8 g/dL (ref 6.5–8.1)

## 2022-06-25 LAB — CBC WITH DIFFERENTIAL/PLATELET
Abs Immature Granulocytes: 2.35 10*3/uL — ABNORMAL HIGH (ref 0.00–0.07)
Basophils Absolute: 0 10*3/uL (ref 0.0–0.1)
Basophils Relative: 0 %
Eosinophils Absolute: 0 10*3/uL (ref 0.0–0.5)
Eosinophils Relative: 0 %
HCT: 34.7 % — ABNORMAL LOW (ref 36.0–46.0)
Hemoglobin: 11.7 g/dL — ABNORMAL LOW (ref 12.0–15.0)
Immature Granulocytes: 8 %
Lymphocytes Relative: 2 %
Lymphs Abs: 0.6 10*3/uL — ABNORMAL LOW (ref 0.7–4.0)
MCH: 32.1 pg (ref 26.0–34.0)
MCHC: 33.7 g/dL (ref 30.0–36.0)
MCV: 95.1 fL (ref 80.0–100.0)
Monocytes Absolute: 0.3 10*3/uL (ref 0.1–1.0)
Monocytes Relative: 1 %
Neutro Abs: 24.6 10*3/uL — ABNORMAL HIGH (ref 1.7–7.7)
Neutrophils Relative %: 89 %
Platelets: 215 10*3/uL (ref 150–400)
RBC: 3.65 MIL/uL — ABNORMAL LOW (ref 3.87–5.11)
RDW: 15.3 % (ref 11.5–15.5)
Smear Review: NORMAL
WBC: 27.9 10*3/uL — ABNORMAL HIGH (ref 4.0–10.5)
nRBC: 0 % (ref 0.0–0.2)

## 2022-06-25 MED ORDER — POTASSIUM CHLORIDE 20 MEQ/100ML IV SOLN
20.0000 meq | Freq: Once | INTRAVENOUS | Status: AC
  Administered 2022-06-25: 20 meq via INTRAVENOUS

## 2022-06-25 MED ORDER — SODIUM CHLORIDE 0.9% FLUSH
10.0000 mL | Freq: Once | INTRAVENOUS | Status: AC
  Administered 2022-06-25: 10 mL via INTRAVENOUS
  Filled 2022-06-25: qty 10

## 2022-06-25 MED ORDER — LIDOCAINE-PRILOCAINE 2.5-2.5 % EX CREA: TOPICAL_CREAM | CUTANEOUS | 3 refills | Status: DC

## 2022-06-25 MED ORDER — HEPARIN SOD (PORK) LOCK FLUSH 100 UNIT/ML IV SOLN
500.0000 [IU] | Freq: Once | INTRAVENOUS | Status: AC
  Administered 2022-06-25: 500 [IU] via INTRAVENOUS
  Filled 2022-06-25: qty 5

## 2022-06-25 MED ORDER — POTASSIUM CHLORIDE CRYS ER 10 MEQ PO TBCR: 10.0000 meq | EXTENDED_RELEASE_TABLET | Freq: Every day | ORAL | 0 refills | Status: DC

## 2022-06-25 MED ORDER — SODIUM CHLORIDE 0.9 % IV SOLN
Freq: Once | INTRAVENOUS | Status: AC
  Filled 2022-06-25: qty 250

## 2022-06-25 NOTE — Progress Notes (Signed)
Pt feeling very weak today. VSS. Potassium 2.8. Pt received 40 mEq KCL over 2 hours this afternoon.Add on for [facility] provider visit as well. Discussed CG issues with patient. Discussed need for patient to try and walk more in the home for some exercise. Pt requested that port needle remain accessed for her to come back later in week. Discharged to home accompanied by her cousin Jenny Reichmann.

## 2022-06-25 NOTE — Telephone Encounter (Signed)
Refill emla cream.

## 2022-06-25 NOTE — Progress Notes (Signed)
Sympt[facility] [place] [facility] [phone] [phone]  Patient Care Team: Idelle Crouch, MD as PCP - General (Internal Medicine) Cammie Sickle, MD as Consulting Physician (Oncology)   Name of the patient: Julie Jennings  [id number]  01/17/1951   Date of visit: 06/25/22  Reason for Consult: Julie Jennings is a 71 y.o. female who presents today for:  Weakness: Patient reports that she has been feeling weak for the past few days. Continuation of previous weakness attributed to low potassium levels and dehydration. She reports mild food/liquid intake. No nausea, vomiting, diarrhea or constipation. No fevers. Feels anxious- chronic but has not been sleeping well which is contributing to her not feeling well. She took a xanax last night and finally got some good sleep.   Denies any neurologic complaints. Denies recent fevers or illnesses. Denies any nausea, vomiting, constipation, or diarrhea. Denies urinary complaints. Patient offers no further specific complaints today.    PAST MEDICAL HISTORY: Past Medical History:  Diagnosis Date   A-fib ([place])    Only once   Anxiety    Arthritis    oesteoarthritis   BP (high blood pressure) 04/16/2014   Chronic kidney disease    history nephrolithiasis   Depression    Dysrhythmia    PSVT   Endometriosis    Herpes zoster    History of kidney stones    Lung cancer ([place]) left   Lymphoma ([place])    "stomach"   Stroke ([place])     PAST SURGICAL HISTORY:  Past Surgical History:  Procedure Laterality Date   AUGMENTATION MAMMAPLASTY Bilateral    CHOLECYSTECTOMY     COLONOSCOPY     COLONOSCOPY WITH PROPOFOL N/A 04/14/2018   Procedure: COLONOSCOPY WITH PROPOFOL;  Surgeon: Manya Silvas, MD;  Location: [facility] ENDOSCOPY;  Service: Endoscopy;  Laterality: N/A;   DILATION AND CURETTAGE OF UTERUS     HEMORRHOIDECTOMY WITH HEMORRHOID BANDING     HERNIA REPAIR     umbilical  hernia   IR IMAGING GUIDED PORT INSERTION  05/04/2022   LUNG REMOVAL, PARTIAL Left    REVERSE SHOULDER ARTHROPLASTY Left 01/04/2022   Procedure: Left reverse shoulder arthroplasty, biceps tenodesis;  Surgeon: Leim Fabry, MD;  Location: [facility] ORS;  Service: Orthopedics;  Laterality: Left;    HEMATOLOGY/ONCOLOGY HISTORY:  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 Biopsy is suggestive of fatty liver changes  # carcinoma of lung status post left upper lobe resection in July of 2014; Adenocarcinoma T1N0 M0 tumor EGFR positive  SURGICAL PATHOLOGY  CASE: [id number]  PATIENT: Julie Jennings  Surgical Pathology Report      Specimen Submitted:  A. Lymph node, left neck   Clinical History: New left cervical lymphadenopathy.  History of  lymphoma and lung cancer.  New lymphadenopathy    MAY 17th, 2023- [Dr.juengle]; A. LYMPH NODE, LEFT NECK; ULTRASOUND-GUIDED BIOPSY:  - FINDINGS COMPATIBLE WITH LARGE B-CELL LYMPHOMA.   Comment:  Core biopsy sections display lymphoid tissue with a somewhat effaced  immunoarchitecture.  Portions of nodal tissue are comprised of sheets of  small lymphocytes, while other areas display dense background fibrosis,  and aggregates of large abnormal lymphocytes with irregular nuclear  contours, open chromatin, visible nucleoli, and retraction artifact.   Immunohistochemical studies demonstrate diffuse positivity for CD20  within regions comprised of larger lymphocytes, compatible with B cells.  CD3 highlights a background of small T  cells.  CK AE1/AE3 is negative  for metastatic carcinoma.  PAX5 displays dim, patchy marking of larger  abnormal B cells.  In addition, these B cells appear to display dim  expression of CD10, with positivity for Bcl-2, BCL6, and Mum-1.  B cells  are negative for CD5.  CD30 displays increased marking.  C-Myc is  positive, staining greater than 40% of larger  cells.  CD45 (LCA) is  diffusely positive, without aberrant loss of expression.   Concurrent flow cytometric studies demonstrate a CD10 positive  monoclonal B-cell population in a background of many polytypic B cells,  representing 3% of total viable lymphoid cells and 8% of B cells.  For  further details, see scanned report in [facility].   The patient's history is of diffuse large B-cell lymphoma, as well as  invasive adenocarcinoma of the lung are noted.  Biopsy sections  demonstrate an abnormal proliferation of large B cells, compatible with  involvement by a diffuse large B-cell lymphoma. Further  subclassification is difficult, secondary to limited tissue, however,  presence of C10 marking would suggest a germinal center immunophenotype.  In addition, there does appear to be expression of both Bcl-2 and c-myc,  which may suggest a more aggressive process. There is no evidence of  metastatic adenocarcinoma. FISH testing for prognostically significant  abnormalities of BCL2, BCL6, and MYC will be attempted, and reported as  an addendum.   IMPRESSION: 1. Enlarged hypermetabolic lymph nodes in the bilateral left greater than right neck and asymmetric right palatine tonsil hypermetabolism with associated soft tissue fullness on the CT images, all new since 2014 PET-CT, most compatible with recurrent lymphoma. Deauville category 5.  # MAY 12th, 2023- DLBCL- STAGE II [NO Bone marrow]; GCB; double expresser-mcy; Bcl-2; QNS-FISH.   # MAY 30th, 2023- RCEOP q 3 W x3-RT   Cancer of upper lobe of left lung (HCC)  Diffuse large B-cell lymphoma of lymph nodes of neck ([place])  04/25/2022 Initial Diagnosis   Diffuse large B-cell lymphoma of lymph nodes of neck ([place])   04/26/2022 Cancer Staging   Staging form: Hodgkin and Non-Hodgkin Lymphoma, AJCC 8th Edition - Clinical: Stage II - Signed by Cammie Sickle, MD on 04/26/2022 Histopathologic type: Malignant lymphoma, large B-cell, diffuse, NOS    05/08/2022 -  Chemotherapy   [name]       ALLERGIES:  is allergic to tylenol [acetaminophen], buspirone, and codeine.  MEDICATIONS:  Current Outpatient Medications  Medication Sig Dispense Refill   acyclovir (ZOVIRAX) 400 MG tablet Take 1 tablet (400 mg total) by mouth 2 (two) times daily. 60 tablet 4   ALPRAZolam (XANAX) 0.5 MG tablet Take 1 tablet (0.5 mg total) by mouth 3 (three) times daily as needed for anxiety. (Patient taking differently: Take 0.5 mg by mouth 3 (three) times daily.) 21 tablet 0   aspirin 81 MG chewable tablet Chew by mouth.     furosemide (LASIX) 40 MG tablet Take 40 mg by mouth daily.     loratadine (CLARITIN) 10 MG tablet Take 10 mg by mouth daily as needed for allergies.     losartan (COZAAR) 100 MG tablet Take 100 mg by mouth daily.     Magnesium Cl-Calcium Carbonate (SLOW MAGNESIUM/CALCIUM) 70-117 MG TBEC Take 1 tablet by mouth 2 (two) times daily. 60 tablet 6   meloxicam (MOBIC) 15 MG tablet Take 1 tablet (15 mg total) by mouth daily. 30 tablet 0  nebivolol (BYSTOLIC) 10 MG tablet Take 10 mg by mouth daily.     ondansetron (ZOFRAN) 4 MG tablet Take 1 tablet (4 mg total) by mouth every 6 (six) hours as needed for nausea. 20 tablet 0   ondansetron (ZOFRAN) 8 MG tablet Every 8 hours as needed for nausea/vomitting. 40 tablet 2   oxyCODONE (OXY IR/ROXICODONE) 5 MG immediate release tablet Take 1 tablet (5 mg total) by mouth every 4 (four) hours as needed for moderate pain (pain score 4-6). 30 tablet 0   Polyethyl Glycol-Propyl Glycol (SYSTANE) 0.4-0.3 % SOLN Place 1 drop into both eyes daily as needed (Dry eye).     potassium chloride (KLOR-CON M) 10 MEQ tablet Take 1 tablet (10 mEq total) by mouth daily. 30 tablet 0   prochlorperazine (COMPAZINE) 10 MG tablet Take 1 tablet (10 mg total) by mouth every 6 (six) hours as needed for nausea or vomiting. 40 tablet 1   tiotropium (SPIRIVA) 18 MCG  inhalation capsule Place 18 mcg into inhaler and inhale daily.     triamcinolone (NASACORT) 55 MCG/ACT AERO nasal inhaler Place 2 sprays into the nose daily.     venlafaxine XR (EFFEXOR-XR) 75 MG 24 hr capsule Take 1 capsule (75 mg total) by mouth 2 (two) times daily. 30 capsule 1   ketoconazole (NIZORAL) 2 % cream Apply to the feet QHS (Patient not taking: Reported on 06/11/2022) 60 g 3   lidocaine-prilocaine (EMLA) cream Apply on the port. 30 -45 min  prior to port access. 30 g 3   pantoprazole (PROTONIX) 40 MG tablet Take 40 mg by mouth 2 (two) times daily.     No current facility-administered medications for this visit.   Facility-Administered Medications Ordered in Other Visits  Medication Dose Route Frequency Provider Last Rate Last Admin   heparin lock flush 100 unit/mL  500 Units Intravenous Once Cammie Sickle, MD        VITAL SIGNS: BP (!) 146/84   Pulse 85   Temp (!) 97.2 F (36.2 C) (Tympanic)   Resp 18  There were no vitals filed for this visit.  Estimated body mass index is 29.95 kg/m as calculated from the following:   Height as of 05/29/22: '5\' 7"'  (1.702 m).   Weight as of 06/18/22: 191 lb 3.2 oz (86.7 kg).  LABS: CBC:    Component Value Date/Time   WBC 27.9 (H) 06/25/2022 1210   HGB 11.7 (L) 06/25/2022 1210   HGB 14.4 03/14/2015 0856   HCT 34.7 (L) 06/25/2022 1210   HCT 41.6 03/14/2015 0856   PLT 215 06/25/2022 1210   PLT 189 03/14/2015 0856   MCV 95.1 06/25/2022 1210   MCV 94 03/14/2015 0856   NEUTROABS 24.6 (H) 06/25/2022 1210   NEUTROABS 4.7 03/14/2015 0856   LYMPHSABS 0.6 (L) 06/25/2022 1210   LYMPHSABS 2.0 03/14/2015 0856   MONOABS 0.3 06/25/2022 1210   MONOABS 0.6 03/14/2015 0856   EOSABS 0.0 06/25/2022 1210   EOSABS 0.3 03/14/2015 0856   BASOSABS 0.0 06/25/2022 1210   BASOSABS 0.1 03/14/2015 0856   Comprehensive Metabolic Panel:    Component Value Date/Time   NA 136 06/25/2022 1210   NA 139 03/14/2015 0856   K 2.8 (L) 06/25/2022 1210    K 4.1 03/14/2015 0856   CL 102 06/25/2022 1210   CL 103 03/14/2015 0856   CO2 28 06/25/2022 1210   CO2 28 03/14/2015 0856   BUN 18 06/25/2022 1210   BUN 19 03/14/2015 0856  CREATININE 0.71 06/25/2022 1210   CREATININE 0.56 03/14/2015 0856   GLUCOSE 171 (H) 06/25/2022 1210   GLUCOSE 111 (H) 03/14/2015 0856   CALCIUM 9.1 06/25/2022 1210   CALCIUM 9.3 03/14/2015 0856   AST 41 06/25/2022 1210   AST 80 (H) 03/14/2015 0856   ALT 48 (H) 06/25/2022 1210   ALT 87 (H) 03/14/2015 0856   ALKPHOS 122 06/25/2022 1210   ALKPHOS 164 (H) 03/14/2015 0856   BILITOT 0.7 06/25/2022 1210   BILITOT 0.8 03/14/2015 0856   PROT 6.8 06/25/2022 1210   PROT 7.7 03/14/2015 0856   ALBUMIN 3.6 06/25/2022 1210   ALBUMIN 4.3 03/14/2015 0856    RADIOGRAPHIC STUDIES: No results found.  PERFORMANCE STATUS (ECOG) : 3 - Symptomatic, >50% confined to bed  Review of Systems Unless otherwise noted, a complete review of systems is negative.  Physical Exam General: NAD, ambulating on her own Psych: Slightly nervous appearing. Normal affect and thought process. No pressured speech or tremor Cardiovascular: regular rate and rhythm Pulmonary: clear ant fields Extremities: no edema, no joint deformities Skin: no rashes Neurological: Weakness but otherwise nonfocal  Assessment and Plan- Patient is a 71 y.o. female    Encounter Diagnoses  Name Primary?   Diffuse large B-cell lymphoma of lymph nodes of neck (HCC)    Dehydration    Hypokalemia Yes   Anxiety    Chronic in nature. She would benefit from close monitoring prior and after treatments.  Acute on chronic. Patient was given IVF today and reports feeling greatly improved. She will continue hydration at home Acute on chronic. [place] given IV. She felt much better after. Sending in potassium supplementation for her to take at home.  Continue working with her PCP regarding her anxiety. Taking her xanax before bed instead of dinner time helps her sleep.  She will trial this.   PLAN: Today IVF + 40 MEQ K RTC Thursday with provider, labs (BMP, mag) + fluids +-K/mag   Patient expressed understanding and was in agreement with this plan. She also understands that She can call clinic at any time with any questions, concerns, or complaints.   Thank you for allowing me to participate in the care of this very pleasant patient.   Time Total: 30  Visit consisted of counseling and education dealing with the complex and emotionally intense issues of symptom management in the setting of serious illness.Greater than 50%  of this time was spent counseling and coordinating care related to the above assessment and plan.  Signed by: Nelwyn Salisbury, PA-C

## 2022-06-26 ENCOUNTER — Inpatient Hospital Stay (HOSPITAL_BASED_OUTPATIENT_CLINIC_OR_DEPARTMENT_OTHER): Payer: Medicare PPO | Admitting: Hospice and Palliative Medicine

## 2022-06-26 ENCOUNTER — Encounter: Payer: Self-pay | Admitting: Hospice and Palliative Medicine

## 2022-06-26 ENCOUNTER — Telehealth: Payer: Self-pay | Admitting: *Deleted

## 2022-06-26 ENCOUNTER — Inpatient Hospital Stay: Payer: Medicare PPO

## 2022-06-26 ENCOUNTER — Other Ambulatory Visit: Payer: Self-pay

## 2022-06-26 ENCOUNTER — Other Ambulatory Visit: Payer: Self-pay | Admitting: *Deleted

## 2022-06-26 VITALS — BP 123/84 | HR 94 | Temp 99.7°F | Resp 20 | Ht 67.0 in | Wt 193.0 lb

## 2022-06-26 DIAGNOSIS — Z5112 Encounter for antineoplastic immunotherapy: Secondary | ICD-10-CM | POA: Diagnosis not present

## 2022-06-26 DIAGNOSIS — E876 Hypokalemia: Secondary | ICD-10-CM

## 2022-06-26 DIAGNOSIS — C8331 Diffuse large B-cell lymphoma, lymph nodes of head, face, and neck: Secondary | ICD-10-CM

## 2022-06-26 DIAGNOSIS — Z95828 Presence of other vascular implants and grafts: Secondary | ICD-10-CM

## 2022-06-26 DIAGNOSIS — C8590 Non-Hodgkin lymphoma, unspecified, unspecified site: Secondary | ICD-10-CM

## 2022-06-26 LAB — BASIC METABOLIC PANEL
Anion gap: 9 (ref 5–15)
BUN: 15 mg/dL (ref 8–23)
CO2: 28 mmol/L (ref 22–32)
Calcium: 9.1 mg/dL (ref 8.9–10.3)
Chloride: 102 mmol/L (ref 98–111)
Creatinine, Ser: 0.71 mg/dL (ref 0.44–1.00)
GFR, Estimated: >60 mL/min (ref >60)
Glucose, Bld: 158 mg/dL — ABNORMAL HIGH (ref 70–99)
Potassium: 3.7 mmol/L (ref 3.5–5.1)
Sodium: 139 mmol/L (ref 135–145)

## 2022-06-26 MED ORDER — SODIUM CHLORIDE 0.9% FLUSH
10.0000 mL | Freq: Once | INTRAVENOUS | Status: AC
  Administered 2022-06-26: 10 mL via INTRAVENOUS
  Filled 2022-06-26: qty 10

## 2022-06-26 MED ORDER — HEPARIN SOD (PORK) LOCK FLUSH 100 UNIT/ML IV SOLN
500.0000 [IU] | Freq: Once | INTRAVENOUS | Status: AC
  Administered 2022-06-26: 500 [IU]
  Filled 2022-06-26: qty 5

## 2022-06-26 MED ORDER — VENLAFAXINE HCL ER 75 MG PO CP24: 225.0000 mg | ORAL_CAPSULE | Freq: Every day | ORAL | 1 refills | Status: AC

## 2022-06-26 MED ORDER — SODIUM CHLORIDE 0.9 % IV SOLN
Freq: Once | INTRAVENOUS | Status: AC
  Filled 2022-06-26: qty 250

## 2022-06-26 NOTE — Progress Notes (Signed)
Symptom Management [place] at [facility] [phone] Fax:([phone]) [phone]  Patient Care Team: Idelle Crouch, MD as PCP - General (Internal Medicine) Cammie Sickle, MD as Consulting Physician (Oncology)   Name of the patient: Julie Jennings  [id number]  03-Mar-1951   Date of visit: 06/26/22  Reason for Consult: Laddie Naeem is a 71 y.o. 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   Patient is on R-CEOP chemotherapy and RT.  Patient received cycle 3 chemotherapy on 06/19/2022.  She was seen yesterday in [facility] with dehydration and hypokalemia.  Patient returns today for follow-up labs and to assess symptoms.  Today, patient reports fatigue and weakness but denies other symptomatic complaints.  Denies any neurologic complaints. Denies recent fevers or illnesses. Denies any easy bleeding or bruising. Reports fair appetite and denies weight loss. Denies chest pain. Denies any nausea, vomiting, constipation, or diarrhea. Denies urinary complaints. Patient offers no further specific complaints today.  PAST MEDICAL HISTORY: Past Medical History:  Diagnosis Date   A-fib ([place])    Only once   Anxiety    Arthritis    oesteoarthritis   BP (high blood pressure) 04/16/2014   Chronic kidney disease    history nephrolithiasis   Depression    Dysrhythmia    PSVT   Endometriosis    Herpes zoster    History of kidney stones    Lung cancer ([place]) left   Lymphoma ([place])    "stomach"   Stroke ([place])     PAST SURGICAL HISTORY:  Past Surgical History:  Procedure Laterality Date   AUGMENTATION MAMMAPLASTY Bilateral    CHOLECYSTECTOMY     COLONOSCOPY     COLONOSCOPY WITH PROPOFOL N/A 04/14/2018   Procedure: COLONOSCOPY WITH PROPOFOL;  Surgeon: Manya Silvas, MD;  Location: [facility] ENDOSCOPY;  Service: Endoscopy;  Laterality: N/A;   DILATION AND CURETTAGE OF UTERUS     HEMORRHOIDECTOMY WITH HEMORRHOID BANDING     HERNIA REPAIR     umbilical hernia   IR IMAGING GUIDED PORT INSERTION  05/04/2022   LUNG REMOVAL, PARTIAL Left    REVERSE SHOULDER ARTHROPLASTY Left 01/04/2022   Procedure: Left reverse shoulder arthroplasty, biceps tenodesis;  Surgeon: Leim Fabry, MD;  Location: [facility] ORS;  Service: Orthopedics;  Laterality: Left;    HEMATOLOGY/ONCOLOGY HISTORY:  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 Biopsy is suggestive of fatty liver changes  # carcinoma of lung status post left upper lobe resection in July of 2014; Adenocarcinoma T1N0 M0 tumor EGFR positive  SURGICAL PATHOLOGY  CASE: [id number]  PATIENT: Julie Jennings  Surgical Pathology Report      Specimen Submitted:  A. Lymph node, left neck   Clinical History: New left cervical lymphadenopathy.  History of  lymphoma and lung cancer.  New lymphadenopathy    MAY 17th, 2023- [Dr.juengle]; A. LYMPH NODE, LEFT NECK; ULTRASOUND-GUIDED BIOPSY:  - FINDINGS COMPATIBLE WITH LARGE B-CELL LYMPHOMA.   Comment:  Core biopsy sections display lymphoid tissue with a somewhat effaced  immunoarchitecture.  Portions of nodal tissue are comprised of sheets of  small lymphocytes, while other areas display dense background fibrosis,  and aggregates of large abnormal lymphocytes with irregular nuclear  contours, open chromatin, visible nucleoli, and retraction artifact.  Immunohistochemical studies demonstrate diffuse positivity for CD20  within regions comprised of larger lymphocytes, compatible with B cells.  CD3 highlights a background of small T cells.  CK AE1/AE3 is negative  for metastatic carcinoma.  PAX5 displays dim, patchy marking of larger  abnormal B cells.  In addition, these B cells appear to display dim   expression of CD10, with positivity for Bcl-2, BCL6, and Mum-1.  B cells  are negative for CD5.  CD30 displays increased marking.  C-Myc is  positive, staining greater than 40% of larger cells.  CD45 (LCA) is  diffusely positive, without aberrant loss of expression.   Concurrent flow cytometric studies demonstrate a CD10 positive  monoclonal B-cell population in a background of many polytypic B cells,  representing 3% of total viable lymphoid cells and 8% of B cells.  For  further details, see scanned report in [facility].   The patient's history is of diffuse large B-cell lymphoma, as well as  invasive adenocarcinoma of the lung are noted.  Biopsy sections  demonstrate an abnormal proliferation of large B cells, compatible with  involvement by a diffuse large B-cell lymphoma. Further  subclassification is difficult, secondary to limited tissue, however,  presence of C10 marking would suggest a germinal center immunophenotype.  In addition, there does appear to be expression of both Bcl-2 and c-myc,  which may suggest a more aggressive process. There is no evidence of  metastatic adenocarcinoma. FISH testing for prognostically significant  abnormalities of BCL2, BCL6, and MYC will be attempted, and reported as  an addendum.   IMPRESSION: 1. Enlarged hypermetabolic lymph nodes in the bilateral left greater than right neck and asymmetric right palatine tonsil hypermetabolism with associated soft tissue fullness on the CT images, all new since 2014 PET-CT, most compatible with recurrent lymphoma. Deauville category 5.  # MAY 12th, 2023- DLBCL- STAGE II [NO Bone marrow]; GCB; double expresser-mcy; Bcl-2; QNS-FISH.   # MAY 30th, 2023- RCEOP q 3 W x3-RT   Cancer of upper lobe of left lung (HCC)  Diffuse large B-cell lymphoma of lymph nodes of neck ([place])  04/25/2022 Initial Diagnosis   Diffuse large B-cell lymphoma of lymph nodes of neck ([place])   04/26/2022 Cancer Staging   Staging form:  Hodgkin and Non-Hodgkin Lymphoma, AJCC 8th Edition - Clinical: Stage II - Signed by Cammie Sickle, MD on 04/26/2022 Histopathologic type: Malignant lymphoma, large B-cell, diffuse, NOS   05/08/2022 -  Chemotherapy   [name]       ALLERGIES:  is allergic to tylenol [acetaminophen], buspirone, and codeine.  MEDICATIONS:  Current Outpatient Medications  Medication Sig Dispense Refill   acyclovir (ZOVIRAX) 400 MG tablet Take 1 tablet (400 mg total) by mouth 2 (two) times daily. 60 tablet 4   ALPRAZolam (XANAX) 0.5 MG tablet Take 1 tablet (0.5 mg total) by mouth 3 (three) times daily as needed for anxiety. (Patient taking differently: Take 0.5 mg by mouth 3 (three) times daily.) 21 tablet 0   aspirin 81 MG chewable tablet Chew by mouth.     furosemide (LASIX) 40 MG tablet Take 40 mg by mouth daily.     ketoconazole (NIZORAL) 2 % cream Apply to the feet QHS (Patient not taking: Reported on 06/11/2022) 60 g 3   lidocaine-prilocaine (EMLA) cream Apply on the port. 30 -45 min  prior to port access. 30 g 3   loratadine (CLARITIN) 10 MG tablet Take 10 mg  by mouth daily as needed for allergies.     losartan (COZAAR) 100 MG tablet Take 100 mg by mouth daily.     Magnesium Cl-Calcium Carbonate (SLOW MAGNESIUM/CALCIUM) 70-117 MG TBEC Take 1 tablet by mouth 2 (two) times daily. 60 tablet 6   meloxicam (MOBIC) 15 MG tablet Take 1 tablet (15 mg total) by mouth daily. 30 tablet 0   nebivolol (BYSTOLIC) 10 MG tablet Take 10 mg by mouth daily.     ondansetron (ZOFRAN) 4 MG tablet Take 1 tablet (4 mg total) by mouth every 6 (six) hours as needed for nausea. 20 tablet 0   ondansetron (ZOFRAN) 8 MG tablet Every 8 hours as needed for nausea/vomitting. 40 tablet 2   oxyCODONE (OXY IR/ROXICODONE) 5 MG immediate release tablet Take 1 tablet (5 mg total) by mouth every 4 (four) hours as needed for moderate pain (pain score 4-6). 30 tablet 0    pantoprazole (PROTONIX) 40 MG tablet Take 40 mg by mouth 2 (two) times daily.     Polyethyl Glycol-Propyl Glycol (SYSTANE) 0.4-0.3 % SOLN Place 1 drop into both eyes daily as needed (Dry eye).     potassium chloride (KLOR-CON M) 10 MEQ tablet Take 1 tablet (10 mEq total) by mouth daily. 30 tablet 0   prochlorperazine (COMPAZINE) 10 MG tablet Take 1 tablet (10 mg total) by mouth every 6 (six) hours as needed for nausea or vomiting. 40 tablet 0   tiotropium (SPIRIVA) 18 MCG inhalation capsule Place 18 mcg into inhaler and inhale daily.     triamcinolone (NASACORT) 55 MCG/ACT AERO nasal inhaler Place 2 sprays into the nose daily.     venlafaxine XR (EFFEXOR-XR) 75 MG 24 hr capsule Take 1 capsule (75 mg total) by mouth 2 (two) times daily. 30 capsule 1   No current facility-administered medications for this visit.   Facility-Administered Medications Ordered in Other Visits  Medication Dose Route Frequency Provider Last Rate Last Admin   heparin lock flush 100 unit/mL  500 Units Intracatheter Once Alric Geise, Kirt Boys, NP        VITAL SIGNS: There were no vitals taken for this visit. There were no vitals filed for this visit.  Estimated body mass index is 30.23 kg/m as calculated from the following:   Height as of an earlier encounter on 06/26/22: _0  (1.702 m).   Weight as of an earlier encounter on 06/26/22: 193 lb (87.5 kg).  LABS: CBC:    Component Value Date/Time   WBC 27.9 (H) 06/25/2022 1210   HGB 11.7 (L) 06/25/2022 1210   HGB 14.4 03/14/2015 0856   HCT 34.7 (L) 06/25/2022 1210   HCT 41.6 03/14/2015 0856   PLT 215 06/25/2022 1210   PLT 189 03/14/2015 0856   MCV 95.1 06/25/2022 1210   MCV 94 03/14/2015 0856   NEUTROABS 24.6 (H) 06/25/2022 1210   NEUTROABS 4.7 03/14/2015 0856   LYMPHSABS 0.6 (L) 06/25/2022 1210   LYMPHSABS 2.0 03/14/2015 0856   MONOABS 0.3 06/25/2022 1210   MONOABS 0.6 03/14/2015 0856   EOSABS 0.0 06/25/2022 1210   EOSABS 0.3 03/14/2015 0856   BASOSABS 0.0  06/25/2022 1210   BASOSABS 0.1 03/14/2015 0856   Comprehensive Metabolic Panel:    Component Value Date/Time   NA 139 06/26/2022 1302   NA 139 03/14/2015 0856   K 3.7 06/26/2022 1302   K 4.1 03/14/2015 0856   CL 102 06/26/2022 1302   CL 103 03/14/2015 0856   CO2 28 06/26/2022 1302  CO2 28 03/14/2015 0856   BUN 15 06/26/2022 1302   BUN 19 03/14/2015 0856   CREATININE 0.71 06/26/2022 1302   CREATININE 0.56 03/14/2015 0856   GLUCOSE 158 (H) 06/26/2022 1302   GLUCOSE 111 (H) 03/14/2015 0856   CALCIUM 9.1 06/26/2022 1302   CALCIUM 9.3 03/14/2015 0856   AST 41 06/25/2022 1210   AST 80 (H) 03/14/2015 0856   ALT 48 (H) 06/25/2022 1210   ALT 87 (H) 03/14/2015 0856   ALKPHOS 122 06/25/2022 1210   ALKPHOS 164 (H) 03/14/2015 0856   BILITOT 0.7 06/25/2022 1210   BILITOT 0.8 03/14/2015 0856   PROT 6.8 06/25/2022 1210   PROT 7.7 03/14/2015 0856   ALBUMIN 3.6 06/25/2022 1210   ALBUMIN 4.3 03/14/2015 0856    RADIOGRAPHIC STUDIES: No results found.  PERFORMANCE STATUS (ECOG) : 0 - Asymptomatic  Review of Systems Unless otherwise noted, a complete review of systems is negative.  Physical Exam General: NAD Cardiovascular: regular rate and rhythm Pulmonary: clear ant fields Abdomen: soft, nontender, + bowel sounds GU: no suprapubic tenderness Extremities: no edema, no joint deformities Skin: no rashes Neurological: Weakness but otherwise nonfocal  Assessment and Plan- Patient is a 71 y.o. 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   Fatigue-likely secondary to cancer/treatment.  However, patient also tearful and suspect depressive component also.  Patient has been on Effexor ER 75 mg twice daily for years.  Unclear why she is taking extended release twice daily.  Will increase Effexor to 225 mg  once daily.  If needed, could try augmenting mirtazapine at bedtime.  Also recommended trial of American ginseng for fatigue.  Will refer for rehab screening.  RTC in 2 days for SMC/fluids  Patient expressed understanding and was in agreement with this plan. She also understands that She can call clinic at any time with any questions, concerns, or complaints.   Thank you for allowing me to participate in the care of this very pleasant patient.   Time Total: 20 minutes  Visit consisted of counseling and education dealing with the complex and emotionally intense issues of symptom management in the setting of serious illness.Greater than 50%  of this time was spent counseling and coordinating care related to the above assessment and plan.  Signed by: Altha Harm, PhD, NP-C

## 2022-06-26 NOTE — Telephone Encounter (Signed)
Patient has called me 3 times today about how she is feeling.  She did get a little bit of sleep but this morning she feels like she is still sleepy so I told her she could try to go back to sleep.  Patient states that she feels her teeth chattering.  Patient has a lot of anxiety because she is not feeling good.  She got up this morning ate breakfast and then walked up and down the hallway wants and feels like she is exhausted.  I told her to try to take a nap and then when she wakes up if she does not feel better to let us know.  Patient call me back to let me her know that she is not feeling any better.  I spoke to Dr. Rogue Bussing and he said that she should come in for symptom management and have labs of a met B.  Patient aware that she comes in at 1:00 today get labs see nurse practitioner and possible fluids if needed.  Patient agreeable to the plan and will be here

## 2022-06-28 ENCOUNTER — Other Ambulatory Visit: Payer: Self-pay | Admitting: *Deleted

## 2022-06-28 ENCOUNTER — Inpatient Hospital Stay: Payer: Medicare PPO

## 2022-06-28 ENCOUNTER — Inpatient Hospital Stay (HOSPITAL_BASED_OUTPATIENT_CLINIC_OR_DEPARTMENT_OTHER): Payer: Medicare PPO | Admitting: Medical Oncology

## 2022-06-28 VITALS — BP 127/83 | HR 94 | Temp 98.7°F | Resp 19 | Wt 190.8 lb

## 2022-06-28 DIAGNOSIS — C8331 Diffuse large B-cell lymphoma, lymph nodes of head, face, and neck: Secondary | ICD-10-CM

## 2022-06-28 DIAGNOSIS — Z5112 Encounter for antineoplastic immunotherapy: Secondary | ICD-10-CM | POA: Diagnosis not present

## 2022-06-28 DIAGNOSIS — E876 Hypokalemia: Secondary | ICD-10-CM

## 2022-06-28 DIAGNOSIS — R5383 Other fatigue: Secondary | ICD-10-CM

## 2022-06-28 DIAGNOSIS — C8590 Non-Hodgkin lymphoma, unspecified, unspecified site: Secondary | ICD-10-CM

## 2022-06-28 DIAGNOSIS — F4323 Adjustment disorder with mixed anxiety and depressed mood: Secondary | ICD-10-CM

## 2022-06-28 DIAGNOSIS — Z95828 Presence of other vascular implants and grafts: Secondary | ICD-10-CM

## 2022-06-28 DIAGNOSIS — F419 Anxiety disorder, unspecified: Secondary | ICD-10-CM

## 2022-06-28 LAB — BASIC METABOLIC PANEL
Anion gap: 8 (ref 5–15)
BUN: 15 mg/dL (ref 8–23)
CO2: 27 mmol/L (ref 22–32)
Calcium: 9 mg/dL (ref 8.9–10.3)
Chloride: 98 mmol/L (ref 98–111)
Creatinine, Ser: 0.78 mg/dL (ref 0.44–1.00)
GFR, Estimated: >60 mL/min (ref >60)
Glucose, Bld: 187 mg/dL — ABNORMAL HIGH (ref 70–99)
Potassium: 3.4 mmol/L — ABNORMAL LOW (ref 3.5–5.1)
Sodium: 133 mmol/L — ABNORMAL LOW (ref 135–145)

## 2022-06-28 MED ORDER — HEPARIN SOD (PORK) LOCK FLUSH 100 UNIT/ML IV SOLN
500.0000 [IU] | Freq: Once | INTRAVENOUS | Status: AC
  Administered 2022-06-28: 500 [IU]
  Filled 2022-06-28: qty 5

## 2022-06-28 MED ORDER — POTASSIUM CHLORIDE 20 MEQ/100ML IV SOLN
20.0000 meq | Freq: Once | INTRAVENOUS | Status: AC
  Administered 2022-06-28: 20 meq via INTRAVENOUS

## 2022-06-28 MED ORDER — SODIUM CHLORIDE 0.9 % IV SOLN
Freq: Once | INTRAVENOUS | Status: AC
  Filled 2022-06-28: qty 250

## 2022-06-28 MED ORDER — SODIUM CHLORIDE 0.9% FLUSH
10.0000 mL | Freq: Once | INTRAVENOUS | Status: AC
  Administered 2022-06-28: 10 mL via INTRAVENOUS
  Filled 2022-06-28: qty 10

## 2022-06-28 NOTE — Progress Notes (Signed)
Spoke to patient in infusion reviewed the labs.  Reassured that patient is going through some of the side effects as expected of the chemotherapy.  Patient meeting with radiation next week.  We will continue follow-up

## 2022-06-28 NOTE — Progress Notes (Signed)
Symptom Management [place] at [facility] [phone] [phone]  Patient Care Team: Idelle Crouch, MD as PCP - General (Internal Medicine) Cammie Sickle, MD as Consulting Physician (Oncology)   Name of the patient: Julie Jennings  [id number]  1951-11-09   Date of visit: 06/28/22  Reason for Consult: Julie Jennings is a 71 y.o. 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   Patient is on R-CEOP chemotherapy and RT.  Patient received cycle 3 chemotherapy on 06/19/2022.  She was seen yesterday in [facility] with dehydration and hypokalemia. She also expressed an increase in anxiety.   Patient returns today for follow-up labs and to assess symptoms.She is here with her cousin who helps care for her. They report that he anxiety has resolved but she feels sluggish and tired. At her last visit her Effexor was increased and a dose of xanax was added to her bedtime routine. She has made both of those changes. No neuro changes, fevers, muscle weakness.   Denies any neurologic complaints. Denies recent fevers or illnesses. Denies any easy bleeding or bruising. Reports fair appetite and denies weight loss. Denies chest pain. Denies any nausea, vomiting, constipation, or diarrhea. Denies urinary complaints. Patient offers no further specific complaints today.  PAST MEDICAL HISTORY: Past Medical History:  Diagnosis Date   A-fib ([place])    Only once   Anxiety    Arthritis    oesteoarthritis   BP (high blood pressure) 04/16/2014   Chronic kidney disease    history nephrolithiasis   Depression    Dysrhythmia    PSVT   Endometriosis    Herpes zoster    History of kidney stones    Lung cancer ([place]) left   Lymphoma ([place])    "stomach"   Stroke ([place])      PAST SURGICAL HISTORY:  Past Surgical History:  Procedure Laterality Date   AUGMENTATION MAMMAPLASTY Bilateral    CHOLECYSTECTOMY     COLONOSCOPY     COLONOSCOPY WITH PROPOFOL N/A 04/14/2018   Procedure: COLONOSCOPY WITH PROPOFOL;  Surgeon: Manya Silvas, MD;  Location: [facility] ENDOSCOPY;  Service: Endoscopy;  Laterality: N/A;   DILATION AND CURETTAGE OF UTERUS     HEMORRHOIDECTOMY WITH HEMORRHOID BANDING     HERNIA REPAIR     umbilical hernia   IR IMAGING GUIDED PORT INSERTION  05/04/2022   LUNG REMOVAL, PARTIAL Left    REVERSE SHOULDER ARTHROPLASTY Left 01/04/2022   Procedure: Left reverse shoulder arthroplasty, biceps tenodesis;  Surgeon: Leim Fabry, MD;  Location: [facility] ORS;  Service: Orthopedics;  Laterality: Left;    HEMATOLOGY/ONCOLOGY HISTORY:  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 Biopsy is suggestive of fatty liver changes  # carcinoma of lung status post left upper lobe resection in July of 2014; Adenocarcinoma T1N0 M0 tumor EGFR positive  SURGICAL PATHOLOGY  CASE: [id number]  PATIENT: Julie Jennings  Surgical Pathology Report      Specimen Submitted:  A. Lymph node, left neck   Clinical History: New left cervical lymphadenopathy.  History of  lymphoma and lung cancer.  New lymphadenopathy    MAY 17th, 2023- [Dr.juengle]; A. LYMPH NODE, LEFT NECK; ULTRASOUND-GUIDED BIOPSY:  - FINDINGS COMPATIBLE WITH LARGE B-CELL LYMPHOMA.   Comment:  Core biopsy sections display lymphoid tissue with a somewhat effaced  immunoarchitecture.  Portions of nodal tissue are comprised of sheets of  small lymphocytes, while other areas display dense background fibrosis,  and aggregates of large abnormal lymphocytes with irregular nuclear  contours, open chromatin, visible nucleoli, and retraction artifact.   Immunohistochemical studies demonstrate diffuse positivity for CD20  within regions  comprised of larger lymphocytes, compatible with B cells.  CD3 highlights a background of small T cells.  CK AE1/AE3 is negative  for metastatic carcinoma.  PAX5 displays dim, patchy marking of larger  abnormal B cells.  In addition, these B cells appear to display dim  expression of CD10, with positivity for Bcl-2, BCL6, and Mum-1.  B cells  are negative for CD5.  CD30 displays increased marking.  C-Myc is  positive, staining greater than 40% of larger cells.  CD45 (LCA) is  diffusely positive, without aberrant loss of expression.   Concurrent flow cytometric studies demonstrate a CD10 positive  monoclonal B-cell population in a background of many polytypic B cells,  representing 3% of total viable lymphoid cells and 8% of B cells.  For  further details, see scanned report in [facility].   The patient's history is of diffuse large B-cell lymphoma, as well as  invasive adenocarcinoma of the lung are noted.  Biopsy sections  demonstrate an abnormal proliferation of large B cells, compatible with  involvement by a diffuse large B-cell lymphoma. Further  subclassification is difficult, secondary to limited tissue, however,  presence of C10 marking would suggest a germinal center immunophenotype.  In addition, there does appear to be expression of both Bcl-2 and c-myc,  which may suggest a more aggressive process. There is no evidence of  metastatic adenocarcinoma. FISH testing for prognostically significant  abnormalities of BCL2, BCL6, and MYC will be attempted, and reported as  an addendum.   IMPRESSION: 1. Enlarged hypermetabolic lymph nodes in the bilateral left greater than right neck and asymmetric right palatine tonsil hypermetabolism with associated soft tissue fullness on the CT images, all new since 2014 PET-CT, most compatible with recurrent lymphoma. Deauville category 5.  # MAY 12th, 2023- DLBCL- STAGE II [NO Bone marrow]; GCB; double expresser-mcy; Bcl-2; QNS-FISH.   # MAY  30th, 2023- RCEOP q 3 W x3-RT   Cancer of upper lobe of left lung (HCC)  Diffuse large B-cell lymphoma of lymph nodes of neck ([place])  04/25/2022 Initial Diagnosis   Diffuse large B-cell lymphoma of lymph nodes of neck ([place])   04/26/2022 Cancer Staging   Staging form: Hodgkin and Non-Hodgkin Lymphoma, AJCC 8th Edition - Clinical: Stage II - Signed by Cammie Sickle, MD on 04/26/2022 Histopathologic type: Malignant lymphoma, large B-cell, diffuse, NOS   05/08/2022 -  Chemotherapy   [name]       ALLERGIES:  is allergic to tylenol [acetaminophen], buspirone, and codeine.  MEDICATIONS:  Current Outpatient Medications  Medication Sig Dispense Refill   acyclovir (ZOVIRAX) 400 MG tablet Take 1 tablet (400 mg total) by mouth 2 (two) times daily. 60 tablet 4   ALPRAZolam (XANAX) 0.5 MG tablet Take 1 tablet (0.5 mg total) by mouth 3 (three) times daily as needed for anxiety. (Patient taking differently: Take 0.5 mg by mouth 3 (three) times daily.) 21 tablet 0   aspirin 81 MG chewable tablet Chew by mouth.     furosemide (LASIX) 40 MG tablet Take 40 mg by mouth daily.  lidocaine-prilocaine (EMLA) cream Apply on the port. 30 -45 min  prior to port access. 30 g 3   loratadine (CLARITIN) 10 MG tablet Take 10 mg by mouth daily as needed for allergies.     losartan (COZAAR) 100 MG tablet Take 100 mg by mouth daily.     Magnesium Cl-Calcium Carbonate (SLOW MAGNESIUM/CALCIUM) 70-117 MG TBEC Take 1 tablet by mouth 2 (two) times daily. 60 tablet 6   meloxicam (MOBIC) 15 MG tablet Take 1 tablet (15 mg total) by mouth daily. 30 tablet 0   nebivolol (BYSTOLIC) 10 MG tablet Take 10 mg by mouth daily.     ondansetron (ZOFRAN) 4 MG tablet Take 1 tablet (4 mg total) by mouth every 6 (six) hours as needed for nausea. 20 tablet 0   ondansetron (ZOFRAN) 8 MG tablet Every 8 hours as needed for nausea/vomitting. 40 tablet 2   oxyCODONE (OXY  IR/ROXICODONE) 5 MG immediate release tablet Take 1 tablet (5 mg total) by mouth every 4 (four) hours as needed for moderate pain (pain score 4-6). 30 tablet 0   pantoprazole (PROTONIX) 40 MG tablet Take 40 mg by mouth 2 (two) times daily.     Polyethyl Glycol-Propyl Glycol (SYSTANE) 0.4-0.3 % SOLN Place 1 drop into both eyes daily as needed (Dry eye).     potassium chloride (KLOR-CON M) 10 MEQ tablet Take 1 tablet (10 mEq total) by mouth daily. 30 tablet 0   prochlorperazine (COMPAZINE) 10 MG tablet Take 1 tablet (10 mg total) by mouth every 6 (six) hours as needed for nausea or vomiting. 40 tablet 0   tiotropium (SPIRIVA) 18 MCG inhalation capsule Place 18 mcg into inhaler and inhale daily.     triamcinolone (NASACORT) 55 MCG/ACT AERO nasal inhaler Place 2 sprays into the nose daily.     venlafaxine XR (EFFEXOR-XR) 75 MG 24 hr capsule Take 3 capsules (225 mg total) by mouth daily with breakfast. 90 capsule 1   ketoconazole (NIZORAL) 2 % cream Apply to the feet QHS (Patient not taking: Reported on 06/11/2022) 60 g 3   No current facility-administered medications for this visit.    VITAL SIGNS: BP 127/83   Pulse 94   Temp 98.7 F (37.1 C)   Resp 19   Wt 190 lb 12.8 oz (86.5 kg)   SpO2 96%   BMI 29.88 kg/m  Filed Weights   06/28/22 0940  Weight: 190 lb 12.8 oz (86.5 kg)    Estimated body mass index is 29.88 kg/m as calculated from the following:   Height as of 06/26/22: _0  (1.702 m).   Weight as of this encounter: 190 lb 12.8 oz (86.5 kg).  LABS: CBC:    Component Value Date/Time   WBC 27.9 (H) 06/25/2022 1210   HGB 11.7 (L) 06/25/2022 1210   HGB 14.4 03/14/2015 0856   HCT 34.7 (L) 06/25/2022 1210   HCT 41.6 03/14/2015 0856   PLT 215 06/25/2022 1210   PLT 189 03/14/2015 0856   MCV 95.1 06/25/2022 1210   MCV 94 03/14/2015 0856   NEUTROABS 24.6 (H) 06/25/2022 1210   NEUTROABS 4.7 03/14/2015 0856   LYMPHSABS 0.6 (L) 06/25/2022 1210   LYMPHSABS 2.0 03/14/2015 0856    MONOABS 0.3 06/25/2022 1210   MONOABS 0.6 03/14/2015 0856   EOSABS 0.0 06/25/2022 1210   EOSABS 0.3 03/14/2015 0856   BASOSABS 0.0 06/25/2022 1210   BASOSABS 0.1 03/14/2015 0856   Comprehensive Metabolic Panel:    Component Value Date/Time   NA  133 (L) 06/28/2022 0952   NA 139 03/14/2015 0856   K 3.4 (L) 06/28/2022 0952   K 4.1 03/14/2015 0856   CL 98 06/28/2022 0952   CL 103 03/14/2015 0856   CO2 27 06/28/2022 0952   CO2 28 03/14/2015 0856   BUN 15 06/28/2022 0952   BUN 19 03/14/2015 0856   CREATININE 0.78 06/28/2022 0952   CREATININE 0.56 03/14/2015 0856   GLUCOSE 187 (H) 06/28/2022 0952   GLUCOSE 111 (H) 03/14/2015 0856   CALCIUM 9.0 06/28/2022 0952   CALCIUM 9.3 03/14/2015 0856   AST 41 06/25/2022 1210   AST 80 (H) 03/14/2015 0856   ALT 48 (H) 06/25/2022 1210   ALT 87 (H) 03/14/2015 0856   ALKPHOS 122 06/25/2022 1210   ALKPHOS 164 (H) 03/14/2015 0856   BILITOT 0.7 06/25/2022 1210   BILITOT 0.8 03/14/2015 0856   PROT 6.8 06/25/2022 1210   PROT 7.7 03/14/2015 0856   ALBUMIN 3.6 06/25/2022 1210   ALBUMIN 4.3 03/14/2015 0856    RADIOGRAPHIC STUDIES: No results found.  PERFORMANCE STATUS (ECOG) : 0 - Asymptomatic  Review of Systems Unless otherwise noted, a complete review of systems is negative.  Physical Exam General: NAD. Flat affect. No tremor. Not falling asleep during encounter. Answering for herself Cardiovascular: regular rate and rhythm Pulmonary: clear ant fields Extremities: no edema, no joint deformities Skin: no rashes Neurological: Weakness but otherwise nonfocal  Assessment and Plan- Patient is a 71 y.o. 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   Fatigue/Anxiety- Chronic and likely secondary to cancer treatment. Caregiver discusses concern for potential  psychological dependency. We have agreed to have her continue her Effexor at current dose until she is evaluated by psychiatry. We will HOLD her bedtime dose of xanax (has only been on it for 2 days) do to concerns that she is a bit overmedicated. She will move her dinnertime dose to closer to bedtime and keep her morning time dose the same. She will be monitored at home. RTC on Monday for re-evaluation.   Patient expressed understanding and was in agreement with this plan. She also understands that She can call clinic at any time with any questions, concerns, or complaints.   Thank you for allowing me to participate in the care of this very pleasant patient.   Time Total: 20 minutes  Visit consisted of counseling and education dealing with the complex and emotionally intense issues of symptom management in the setting of serious illness.Greater than 50%  of this time was spent counseling and coordinating care related to the above assessment and plan.  Signed by: Nelwyn Salisbury PA-C

## 2022-06-28 NOTE — Progress Notes (Signed)
Patient states she is nausea this morning, weak and tired. Patient states she has had some diarrhea this morning. Patient is NOTICEABLE sluggish. Patient cousin states we changed instruction on her medication and doesn't know if this has anything to do with the change in her.

## 2022-06-29 ENCOUNTER — Inpatient Hospital Stay (HOSPITAL_BASED_OUTPATIENT_CLINIC_OR_DEPARTMENT_OTHER): Payer: Medicare PPO | Admitting: Hospice and Palliative Medicine

## 2022-06-29 DIAGNOSIS — F419 Anxiety disorder, unspecified: Secondary | ICD-10-CM

## 2022-06-29 MED ORDER — TRAZODONE HCL 50 MG PO TABS: 25.0000 mg | ORAL_TABLET | Freq: Every evening | ORAL | 0 refills | Status: DC | PRN

## 2022-06-29 NOTE — Progress Notes (Signed)
Virtual Visit via Telephone Note  I connected with Julie Jennings on 06/29/22 at 10:45 AM EDT by telephone and verified that I am speaking with the correct person using two identifiers.  Location: Patient: Home Provider: Clinic   I discussed the limitations, risks, security and privacy concerns of performing an evaluation and management service by telephone and the availability of in person appointments. I also discussed with the patient that there may be a patient responsible charge related to this service. The patient expressed understanding and agreed to proceed.   History of Present Illness: Julie Jennings is a 71 y.o. 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    Observations/Objective: Patient has been seen multiple times in clinic this week due to severe depressive symptoms/anxiety.  Patient saw Judson Roch in clinic yesterday who advised dose reduction of alprazolam due to concerns that patient was somewhat overmedicated.  Today, patient says that she is not able to sleep at all.  She continues to endorse depressive/anxiety symptoms and would like something to take in place of Xanax to help her sleep.  Patient is pending psychiatry evaluation.  Assessment and Plan: Depression/anxiety - continue Effexor XR. Patient to follow up with psychiatry. Referral to LCSW.  Insomnia - will start low dose trazodone QHS PRN. Limit use with alprazolam.   Follow Up Instructions: As scheduled   I discussed the assessment and treatment plan with the patient. The patient was provided an opportunity to ask questions and all were answered. The patient agreed with the plan and demonstrated an understanding of the instructions.   The patient was advised to call back or seek an in-person evaluation if the symptoms worsen or if  the condition fails to improve as anticipated.  I provided 10 minutes of non-face-to-face time during this encounter.   Irean Hong, NP

## 2022-07-02 ENCOUNTER — Inpatient Hospital Stay: Payer: Medicare PPO

## 2022-07-02 ENCOUNTER — Inpatient Hospital Stay (HOSPITAL_BASED_OUTPATIENT_CLINIC_OR_DEPARTMENT_OTHER): Payer: Medicare PPO | Admitting: Hospice and Palliative Medicine

## 2022-07-02 ENCOUNTER — Encounter: Payer: Self-pay | Admitting: Licensed Clinical Social Worker

## 2022-07-02 ENCOUNTER — Other Ambulatory Visit: Payer: Self-pay

## 2022-07-02 VITALS — BP 132/83 | HR 86 | Temp 98.3°F | Resp 16

## 2022-07-02 DIAGNOSIS — C8331 Diffuse large B-cell lymphoma, lymph nodes of head, face, and neck: Secondary | ICD-10-CM

## 2022-07-02 DIAGNOSIS — Z5112 Encounter for antineoplastic immunotherapy: Secondary | ICD-10-CM | POA: Diagnosis not present

## 2022-07-02 DIAGNOSIS — F419 Anxiety disorder, unspecified: Secondary | ICD-10-CM

## 2022-07-02 DIAGNOSIS — C8590 Non-Hodgkin lymphoma, unspecified, unspecified site: Secondary | ICD-10-CM

## 2022-07-02 DIAGNOSIS — Z95828 Presence of other vascular implants and grafts: Secondary | ICD-10-CM

## 2022-07-02 LAB — BASIC METABOLIC PANEL
Anion gap: 7 (ref 5–15)
BUN: 12 mg/dL (ref 8–23)
CO2: 27 mmol/L (ref 22–32)
Calcium: 8.9 mg/dL (ref 8.9–10.3)
Chloride: 99 mmol/L (ref 98–111)
Creatinine, Ser: 0.58 mg/dL (ref 0.44–1.00)
GFR, Estimated: >60 mL/min (ref >60)
Glucose, Bld: 129 mg/dL — ABNORMAL HIGH (ref 70–99)
Potassium: 3.8 mmol/L (ref 3.5–5.1)
Sodium: 133 mmol/L — ABNORMAL LOW (ref 135–145)

## 2022-07-02 LAB — MAGNESIUM: Magnesium: 2.1 mg/dL (ref 1.7–2.4)

## 2022-07-02 LAB — CBC WITH DIFFERENTIAL/PLATELET
Abs Immature Granulocytes: 1.41 10*3/uL — ABNORMAL HIGH (ref 0.00–0.07)
Basophils Absolute: 0.1 10*3/uL (ref 0.0–0.1)
Basophils Relative: 0 %
Eosinophils Absolute: 0.1 10*3/uL (ref 0.0–0.5)
Eosinophils Relative: 1 %
HCT: 37 % (ref 36.0–46.0)
Hemoglobin: 12.4 g/dL (ref 12.0–15.0)
Immature Granulocytes: 7 %
Lymphocytes Relative: 4 %
Lymphs Abs: 0.7 10*3/uL (ref 0.7–4.0)
MCH: 32 pg (ref 26.0–34.0)
MCHC: 33.5 g/dL (ref 30.0–36.0)
MCV: 95.6 fL (ref 80.0–100.0)
Monocytes Absolute: 1.2 10*3/uL — ABNORMAL HIGH (ref 0.1–1.0)
Monocytes Relative: 6 %
Neutro Abs: 16.2 10*3/uL — ABNORMAL HIGH (ref 1.7–7.7)
Neutrophils Relative %: 82 %
Platelets: 212 10*3/uL (ref 150–400)
RBC: 3.87 MIL/uL (ref 3.87–5.11)
RDW: 15.7 % — ABNORMAL HIGH (ref 11.5–15.5)
Smear Review: NORMAL
WBC: 19.8 10*3/uL — ABNORMAL HIGH (ref 4.0–10.5)
nRBC: 0.1 % (ref 0.0–0.2)

## 2022-07-02 MED ORDER — SODIUM CHLORIDE 0.9 % IV SOLN
INTRAVENOUS | Status: DC
  Filled 2022-07-02 (×2): qty 250

## 2022-07-02 MED ORDER — HEPARIN SOD (PORK) LOCK FLUSH 100 UNIT/ML IV SOLN
500.0000 [IU] | Freq: Once | INTRAVENOUS | Status: AC
  Administered 2022-07-02: 500 [IU] via INTRAVENOUS
  Filled 2022-07-02: qty 5

## 2022-07-02 MED ORDER — SODIUM CHLORIDE 0.9% FLUSH
10.0000 mL | Freq: Once | INTRAVENOUS | Status: AC
  Administered 2022-07-02: 10 mL via INTRAVENOUS
  Filled 2022-07-02: qty 10

## 2022-07-02 NOTE — Progress Notes (Signed)
Symptom Management [place] at [facility] [phone] [phone]  Patient Care Team: Idelle Crouch, MD as PCP - General (Internal Medicine) Cammie Sickle, MD as Consulting Physician (Oncology)   Name of the patient: Julie Jennings  [id number]  [birth year]   Date of visit: 07/02/22  Reason for Consult: Tashe Purdon is a 71 y.o. 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   Patient is on R-CEOP chemotherapy and RT.  Patient received cycle 3 chemotherapy on 06/19/2022.  Patient is frequently utilizing [facility] for supportive care given ongoing fatigue, anxiety, depression.  She has been referred to psychiatry.  Today, patient reports fatigue and weakness but denies other symptomatic complaints.  Denies any neurologic complaints. Denies recent fevers or illnesses. Denies any easy bleeding or bruising. Reports fair appetite and denies weight loss. Denies chest pain. Denies any nausea, vomiting, constipation, or diarrhea. Denies urinary complaints. Patient offers no further specific complaints today.  PAST MEDICAL HISTORY: Past Medical History:  Diagnosis Date   A-fib ([place])    Only once   Anxiety    Arthritis    oesteoarthritis   BP (high blood pressure) 04/16/2014   Chronic kidney disease    history nephrolithiasis   Depression    Dysrhythmia    PSVT   Endometriosis    Herpes zoster    History of kidney stones    Lung cancer ([place]) left   Lymphoma ([place])    "stomach"   Stroke ([place])     PAST SURGICAL HISTORY:  Past Surgical History:  Procedure Laterality Date   AUGMENTATION MAMMAPLASTY Bilateral    CHOLECYSTECTOMY     COLONOSCOPY     COLONOSCOPY WITH PROPOFOL N/A 04/14/2018   Procedure: COLONOSCOPY WITH PROPOFOL;  Surgeon: Manya Silvas, MD;  Location: [facility] ENDOSCOPY;  Service: Endoscopy;  Laterality: N/A;   DILATION AND CURETTAGE OF UTERUS     HEMORRHOIDECTOMY WITH HEMORRHOID BANDING     HERNIA REPAIR     umbilical hernia   IR IMAGING GUIDED PORT INSERTION  05/04/2022   LUNG REMOVAL, PARTIAL Left    REVERSE SHOULDER ARTHROPLASTY Left 01/04/2022   Procedure: Left reverse shoulder arthroplasty, biceps tenodesis;  Surgeon: Leim Fabry, MD;  Location: [facility] ORS;  Service: Orthopedics;  Laterality: Left;    HEMATOLOGY/ONCOLOGY HISTORY:  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 Biopsy is suggestive of fatty liver changes  # carcinoma of lung status post left upper lobe resection in July of 2014; Adenocarcinoma T1N0 M0 tumor EGFR positive  SURGICAL PATHOLOGY  CASE: [id number]  PATIENT: Awa Huskins  Surgical Pathology Report      Specimen Submitted:  A. Lymph node, left neck   Clinical History: New left cervical lymphadenopathy.  History of  lymphoma and lung cancer.  New lymphadenopathy    MAY 17th, 2023- [Dr.juengle]; A. LYMPH NODE, LEFT NECK; ULTRASOUND-GUIDED BIOPSY:  - FINDINGS COMPATIBLE WITH LARGE B-CELL LYMPHOMA.   Comment:  Core biopsy sections display lymphoid tissue with a somewhat effaced  immunoarchitecture.  Portions of nodal tissue are comprised of sheets of  small lymphocytes, while other areas display dense background fibrosis,  and aggregates of large abnormal lymphocytes with irregular nuclear  contours, open chromatin, visible nucleoli, and retraction artifact.  Immunohistochemical studies demonstrate diffuse positivity for CD20  within regions comprised of larger lymphocytes, compatible with B cells.  CD3 highlights a background of small T cells.  CK AE1/AE3 is negative  for metastatic carcinoma.  PAX5 displays dim, patchy marking of larger  abnormal B cells.  In addition, these B cells appear to  display dim  expression of CD10, with positivity for Bcl-2, BCL6, and Mum-1.  B cells  are negative for CD5.  CD30 displays increased marking.  C-Myc is  positive, staining greater than 40% of larger cells.  CD45 (LCA) is  diffusely positive, without aberrant loss of expression.   Concurrent flow cytometric studies demonstrate a CD10 positive  monoclonal B-cell population in a background of many polytypic B cells,  representing 3% of total viable lymphoid cells and 8% of B cells.  For  further details, see scanned report in [facility].   The patient's history is of diffuse large B-cell lymphoma, as well as  invasive adenocarcinoma of the lung are noted.  Biopsy sections  demonstrate an abnormal proliferation of large B cells, compatible with  involvement by a diffuse large B-cell lymphoma. Further  subclassification is difficult, secondary to limited tissue, however,  presence of C10 marking would suggest a germinal center immunophenotype.  In addition, there does appear to be expression of both Bcl-2 and c-myc,  which may suggest a more aggressive process. There is no evidence of  metastatic adenocarcinoma. FISH testing for prognostically significant  abnormalities of BCL2, BCL6, and MYC will be attempted, and reported as  an addendum.   IMPRESSION: 1. Enlarged hypermetabolic lymph nodes in the bilateral left greater than right neck and asymmetric right palatine tonsil hypermetabolism with associated soft tissue fullness on the CT images, all new since 2014 PET-CT, most compatible with recurrent lymphoma. Deauville category 5.  # MAY 12th, 2023- DLBCL- STAGE II [NO Bone marrow]; GCB; double expresser-mcy; Bcl-2; QNS-FISH.   # MAY 30th, 2023- RCEOP q 3 W x3-RT   Cancer of upper lobe of left lung (HCC)  Diffuse large B-cell lymphoma of lymph nodes of neck ([place])  04/25/2022 Initial Diagnosis   Diffuse large B-cell lymphoma of lymph nodes of neck ([place])   04/26/2022 Cancer Staging    Staging form: Hodgkin and Non-Hodgkin Lymphoma, AJCC 8th Edition - Clinical: Stage II - Signed by Cammie Sickle, MD on 04/26/2022 Histopathologic type: Malignant lymphoma, large B-cell, diffuse, NOS   05/08/2022 -  Chemotherapy   [name]       ALLERGIES:  is allergic to tylenol [acetaminophen], buspirone, and codeine.  MEDICATIONS:  Current Outpatient Medications  Medication Sig Dispense Refill   acyclovir (ZOVIRAX) 400 MG tablet Take 1 tablet (400 mg total) by mouth 2 (two) times daily. 60 tablet 4   ALPRAZolam (XANAX) 0.5 MG tablet Take 1 tablet (0.5 mg total) by mouth 3 (three) times daily as needed for anxiety. (Patient taking differently: Take 0.5 mg by mouth 3 (three) times daily.) 21 tablet 0   aspirin 81 MG chewable tablet Chew by mouth.     furosemide (LASIX) 40 MG tablet Take 40 mg by mouth daily.     ketoconazole (NIZORAL) 2 % cream Apply to the feet QHS (Patient not taking: Reported on 06/11/2022) 60 g 3   lidocaine-prilocaine (EMLA) cream Apply on the port. 30 -45 min  prior to port access. 30 g 3   loratadine (CLARITIN) 10 MG tablet Take 10 mg  by mouth daily as needed for allergies.     losartan (COZAAR) 100 MG tablet Take 100 mg by mouth daily.     Magnesium Cl-Calcium Carbonate (SLOW MAGNESIUM/CALCIUM) 70-117 MG TBEC Take 1 tablet by mouth 2 (two) times daily. 60 tablet 6   meloxicam (MOBIC) 15 MG tablet Take 1 tablet (15 mg total) by mouth daily. 30 tablet 0   nebivolol (BYSTOLIC) 10 MG tablet Take 10 mg by mouth daily.     ondansetron (ZOFRAN) 4 MG tablet Take 1 tablet (4 mg total) by mouth every 6 (six) hours as needed for nausea. 20 tablet 0   ondansetron (ZOFRAN) 8 MG tablet Every 8 hours as needed for nausea/vomitting. 40 tablet 2   oxyCODONE (OXY IR/ROXICODONE) 5 MG immediate release tablet Take 1 tablet (5 mg total) by mouth every 4 (four) hours as needed for moderate pain (pain score 4-6). 30  tablet 0   pantoprazole (PROTONIX) 40 MG tablet Take 40 mg by mouth 2 (two) times daily.     Polyethyl Glycol-Propyl Glycol (SYSTANE) 0.4-0.3 % SOLN Place 1 drop into both eyes daily as needed (Dry eye).     potassium chloride (KLOR-CON M) 10 MEQ tablet Take 1 tablet (10 mEq total) by mouth daily. 30 tablet 0   prochlorperazine (COMPAZINE) 10 MG tablet Take 1 tablet (10 mg total) by mouth every 6 (six) hours as needed for nausea or vomiting. 40 tablet 0   tiotropium (SPIRIVA) 18 MCG inhalation capsule Place 18 mcg into inhaler and inhale daily.     traZODone (DESYREL) 50 MG tablet Take 0.5-1 tablets (25-50 mg total) by mouth at bedtime as needed for sleep. 30 tablet 0   triamcinolone (NASACORT) 55 MCG/ACT AERO nasal inhaler Place 2 sprays into the nose daily.     venlafaxine XR (EFFEXOR-XR) 75 MG 24 hr capsule Take 3 capsules (225 mg total) by mouth daily with breakfast. 90 capsule 1   No current facility-administered medications for this visit.   Facility-Administered Medications Ordered in Other Visits  Medication Dose Route Frequency Provider Last Rate Last Admin   0.9 %  sodium chloride infusion   Intravenous Continuous Hughie Closs, [place] 999 mL/hr at 07/02/22 1427 New Bag at 07/02/22 1427   heparin lock flush 100 unit/mL  500 Units Intravenous Once [place], Sarah M, PA-C       sodium chloride flush (NS) 0.9 % injection 10 mL  10 mL Intravenous Once [place], Sarah M, PA-C        VITAL SIGNS: BP 132/83   Pulse 86   Temp 98.3 F (36.8 C) (Oral)   Resp 16   SpO2 98%  There were no vitals filed for this visit.  Estimated body mass index is 29.88 kg/m as calculated from the following:   Height as of 06/26/22: '5\' 7"'  (1.702 m).   Weight as of 06/28/22: 190 lb 12.8 oz (86.5 kg).  LABS: CBC:    Component Value Date/Time   WBC 19.8 (H) 07/02/2022 1415   HGB 12.4 07/02/2022 1415   HGB 14.4 03/14/2015 0856   HCT 37.0 07/02/2022 1415   HCT 41.6 03/14/2015 0856   PLT 212  07/02/2022 1415   PLT 189 03/14/2015 0856   MCV 95.6 07/02/2022 1415   MCV 94 03/14/2015 0856   NEUTROABS PENDING 07/02/2022 1415   NEUTROABS 4.7 03/14/2015 0856   LYMPHSABS PENDING 07/02/2022 1415   LYMPHSABS 2.0 03/14/2015 0856   MONOABS PENDING 07/02/2022 1415   MONOABS 0.6 03/14/2015 0856  EOSABS PENDING 07/02/2022 1415   EOSABS 0.3 03/14/2015 0856   BASOSABS PENDING 07/02/2022 1415   BASOSABS 0.1 03/14/2015 0856   Comprehensive Metabolic Panel:    Component Value Date/Time   NA 133 (L) 07/02/2022 1415   NA 139 03/14/2015 0856   K 3.8 07/02/2022 1415   K 4.1 03/14/2015 0856   CL 99 07/02/2022 1415   CL 103 03/14/2015 0856   CO2 27 07/02/2022 1415   CO2 28 03/14/2015 0856   BUN 12 07/02/2022 1415   BUN 19 03/14/2015 0856   CREATININE 0.58 07/02/2022 1415   CREATININE 0.56 03/14/2015 0856   GLUCOSE 129 (H) 07/02/2022 1415   GLUCOSE 111 (H) 03/14/2015 0856   CALCIUM 8.9 07/02/2022 1415   CALCIUM 9.3 03/14/2015 0856   AST 41 06/25/2022 1210   AST 80 (H) 03/14/2015 0856   ALT 48 (H) 06/25/2022 1210   ALT 87 (H) 03/14/2015 0856   ALKPHOS 122 06/25/2022 1210   ALKPHOS 164 (H) 03/14/2015 0856   BILITOT 0.7 06/25/2022 1210   BILITOT 0.8 03/14/2015 0856   PROT 6.8 06/25/2022 1210   PROT 7.7 03/14/2015 0856   ALBUMIN 3.6 06/25/2022 1210   ALBUMIN 4.3 03/14/2015 0856    RADIOGRAPHIC STUDIES: No results found.  PERFORMANCE STATUS (ECOG) : 0 - Asymptomatic  Review of Systems Unless otherwise noted, a complete review of systems is negative.  Physical Exam General: NAD Cardiovascular: regular rate and rhythm Pulmonary: clear ant fields Abdomen: soft, nontender, + bowel sounds GU: no suprapubic tenderness Extremities: no edema, no joint deformities Skin: no rashes Neurological: Weakness but otherwise nonfocal  Assessment and Plan- Patient is a 71 y.o. 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   Fatigue-likely secondary to cancer/treatment.  Patient is status post 3 cycles of R-CEOP chemotherapy.  Hopefully, symptoms will improve over time.  However, patient also has significant anxiety and depression, which is likely exacerbating fatigue.  Effexor dose was recently increased to 225 mg daily.  Patient was not sleeping well so was trialed on trazodone but she did not like the way that made her feel and so self discontinued.  Patient has been referred to psychiatry.  Dehydration -proceed with IV fluids today.  Labs grossly unremarkable other than mild hyponatremia.  Also noted to have leukocytosis, likely secondary to [facility].  RTC next week  Patient expressed understanding and was in agreement with this plan. She also understands that She can call clinic at any time with any questions, concerns, or complaints.   Thank you for allowing me to participate in the care of this very pleasant patient.   Time Total: 20 minutes  Visit consisted of counseling and education dealing with the complex and emotionally intense issues of symptom management in the setting of serious illness.Greater than 50%  of this time was spent counseling and coordinating care related to the above assessment and plan.  Signed by: Altha Harm, PhD, NP-C

## 2022-07-02 NOTE — Progress Notes (Signed)
[place] Work  Clinical Social Work was referred by medical provider for assessment of psychosocial needs.  Clinical Social Worker attempted to contact patient by phone  to offer support and assess for needs.  CSW left voicemail with contact information and request for return call.  First Attempt  Adelene Amas, [place]

## 2022-07-02 NOTE — Progress Notes (Signed)
Returns for follow-up and IV fluids. States that she feels "just terrible". She also states that she did not like how the trazodone made her feel so groggy. She called nurse last week and was informed she could stop it, and start taking her xanax again at bedtime.

## 2022-07-03 ENCOUNTER — Telehealth: Payer: Self-pay

## 2022-07-03 ENCOUNTER — Other Ambulatory Visit: Payer: Self-pay

## 2022-07-03 ENCOUNTER — Encounter: Payer: Self-pay | Admitting: Psychiatry

## 2022-07-03 ENCOUNTER — Ambulatory Visit (INDEPENDENT_AMBULATORY_CARE_PROVIDER_SITE_OTHER): Payer: Medicare PPO | Admitting: Psychiatry

## 2022-07-03 VITALS — BP 130/90 | HR 97 | Temp 98.1°F | Ht 66.0 in | Wt 192.6 lb

## 2022-07-03 DIAGNOSIS — F419 Anxiety disorder, unspecified: Secondary | ICD-10-CM | POA: Diagnosis not present

## 2022-07-03 DIAGNOSIS — F331 Major depressive disorder, recurrent, moderate: Secondary | ICD-10-CM | POA: Diagnosis not present

## 2022-07-03 MED ORDER — QUETIAPINE FUMARATE 25 MG PO TABS: 25.0000 mg | ORAL_TABLET | Freq: Every day | ORAL | 0 refills | Status: DC
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
## 2022-07-03 NOTE — Patient Instructions (Signed)
Continue venlafaxine 225 mg daily  Start quetiapine 25 mg at night  Continue Xanax 0.5 mg 3 times a day as needed for anxiety Next appointment: 8/21 at 1 PM, in person

## 2022-07-03 NOTE — Telephone Encounter (Signed)
Family member Julie Jennings is calling because she needs in home help caring for Julie Jennings. Cindy's call back # [phone]

## 2022-07-04 ENCOUNTER — Ambulatory Visit
Admission: RE | Admit: 2022-07-04 | Discharge: 2022-07-04 | Disposition: A | Discharge status: Home / Self Care | Payer: Medicare PPO | Source: Ambulatory Visit | Attending: Radiation Oncology | Admitting: Radiation Oncology

## 2022-07-04 DIAGNOSIS — C8331 Diffuse large B-cell lymphoma, lymph nodes of head, face, and neck: Secondary | ICD-10-CM | POA: Insufficient documentation

## 2022-07-04 DIAGNOSIS — Z5111 Encounter for antineoplastic chemotherapy: Secondary | ICD-10-CM | POA: Diagnosis present

## 2022-07-04 DIAGNOSIS — R531 Weakness: Secondary | ICD-10-CM | POA: Insufficient documentation

## 2022-07-04 DIAGNOSIS — Z510 Encounter for antineoplastic radiation therapy: Secondary | ICD-10-CM | POA: Diagnosis not present

## 2022-07-04 DIAGNOSIS — Z7689 Persons encountering health services in other specified circumstances: Secondary | ICD-10-CM | POA: Insufficient documentation

## 2022-07-04 DIAGNOSIS — Z85118 Personal history of other malignant neoplasm of bronchus and lung: Secondary | ICD-10-CM | POA: Insufficient documentation

## 2022-07-04 DIAGNOSIS — K5900 Constipation, unspecified: Secondary | ICD-10-CM | POA: Insufficient documentation

## 2022-07-05 ENCOUNTER — Inpatient Hospital Stay: Payer: Medicare PPO | Admitting: Licensed Clinical Social Worker

## 2022-07-05 DIAGNOSIS — C8331 Diffuse large B-cell lymphoma, lymph nodes of head, face, and neck: Secondary | ICD-10-CM

## 2022-07-05 DIAGNOSIS — C3412 Malignant neoplasm of upper lobe, left bronchus or lung: Secondary | ICD-10-CM

## 2022-07-05 NOTE — Progress Notes (Signed)
[facility] [facility]  Initial Assessment   Julie Jennings is a 71 y.o. year old female contacted caregiver by phone. Clinical Social Work was referred by medical provider for assessment of psychosocial needs.   SDOH (Social Determinants of Health) assessments performed: Yes SDOH Interventions    Flowsheet Row Most Recent Value  SDOH Interventions   Food Insecurity Interventions Intervention Not Indicated  Financial Strain Interventions Intervention Not Indicated  Housing Interventions Intervention Not Indicated  Physical Activity Interventions Intervention Not Indicated  Stress Interventions Provide Counseling  Social Connections Interventions Intervention Not Indicated  Transportation Interventions [facility] ([place]. Only)  Depression Interventions/Treatment  Counseling       SDOH Screenings   Alcohol Screen: Low Risk  (07/05/2022)   Alcohol Screen    Last Alcohol Screening Score (AUDIT): 0  Depression (PHQ2-9): Medium Risk (07/05/2022)   Depression (PHQ2-9)    PHQ-2 Score: 13  Financial Resource Strain: Low Risk  (07/05/2022)   Overall Financial Resource Strain (CARDIA)    Difficulty of Paying Living Expenses: Not hard at all  Food Insecurity: No Food Insecurity (07/05/2022)   Hunger Vital Sign    Worried About Running Out of Food in the Last Year: Never true    [place] in the Last Year: Never true  Housing: Low Risk  (07/05/2022)   Housing    Last Housing Risk Score: 0  Physical Activity: Inactive (07/05/2022)   Exercise Vital Sign    Days of Exercise per Week: 0 days    Minutes of Exercise per Session: 0 min  Social Connections: Socially Isolated (07/05/2022)   Social Connection and Isolation Panel [NHANES]    Frequency of Communication with Friends and Family: More than three times a week    Frequency of Social Gatherings with Friends and Family: More than three times a week    Attends Religious Services: Never    Marine scientist or  Organizations: No    Attends Archivist Meetings: Not on file    Marital Status: Widowed  Stress: Stress Concern Present (07/05/2022)   [place] Questionnaire    Feeling of Stress : Rather much  Tobacco Use: Low Risk  (07/03/2022)   Patient History    Smoking Tobacco Use: Never    Smokeless Tobacco Use: Never    Passive Exposure: Not on file  Transportation Needs: No Transportation Needs (07/05/2022)   PRAPARE - Transportation    Lack of Transportation (Medical): No    Lack of Transportation (Non-Medical): No     Distress Screen completed: No     No data to display            Family/Social Information:  Housing Arrangement: patient lives alonebut has family and friends checking in on her regularly, main care giver cousin Lucillie Garfinkel [phone] Family members/support persons in your life? Family, Friends, Social worker, Education administrator, and Geophysical data processor concerns: no, but may need transportation services if caregiver is unable to bring her to appointments  Employment: Retired  .  Income source: Conservation officer, historic buildings and [employer] concerns: No Type of concern: None Food access concerns: no Religious or spiritual practice: Yes-but currently not attending services due to chemo treatment side effects Services Currently in place:  [employer]  Coping/ Adjustment to diagnosis: Patient understands treatment plan and what happens next? yes Concerns about diagnosis and/or treatment: Pain or discomfort during procedures, Feelings of anger or sadness, Overwhelmed by information,  Afraid of cancer, How will I care for myself, and Quality of life Patient reported stressors: Depression, Anxiety/ nervousness, Adjusting to my illness, Isolation/ feeling alone, Feeling hopeless, Relating to God, and Facing my mortality Hopes and/or priorities: N/A Patient enjoys time with family/ friends and church  activities Current coping skills/ strengths: Average or above average intelligence , Scientist, research (life sciences) , Religious Affiliation , and Supportive family/friends     SUMMARY: Current SDOH Barriers:  Limited social support and Mental Health Concerns   Clinical Social Work Clinical Goal(s):  Patient will work with SW to address concerns related to depression, anxiety and adjustment to illness and treatment  Interventions: Discussed common feeling and emotions when being diagnosed with cancer, and the importance of support during treatment Informed patient of the support team roles and support services at [facility] Provided CSW contact information and encouraged patient to call with any questions or concerns Provided patient with information about CSW role in patient care and available resources.  CSW emailed patient's caregiver Ms. Starnes a private duty caregiver list [email or web address].   Follow Up Plan: CSW will see patient on 07/19/2022 Patient verbalizes understanding of plan: Yes    Nathaneil Feagans, LCSW

## 2022-07-09 ENCOUNTER — Inpatient Hospital Stay: Payer: Medicare PPO

## 2022-07-09 ENCOUNTER — Other Ambulatory Visit: Payer: Medicare PPO

## 2022-07-09 ENCOUNTER — Encounter: Payer: Self-pay | Admitting: Nurse Practitioner

## 2022-07-09 ENCOUNTER — Ambulatory Visit: Payer: Medicare PPO | Admitting: Internal Medicine

## 2022-07-09 ENCOUNTER — Inpatient Hospital Stay (HOSPITAL_BASED_OUTPATIENT_CLINIC_OR_DEPARTMENT_OTHER): Payer: Medicare PPO | Admitting: Nurse Practitioner

## 2022-07-09 VITALS — BP 128/89 | HR 92 | Temp 98.7°F | Resp 19 | Wt 192.8 lb

## 2022-07-09 DIAGNOSIS — Z5112 Encounter for antineoplastic immunotherapy: Secondary | ICD-10-CM | POA: Diagnosis not present

## 2022-07-09 DIAGNOSIS — C801 Malignant (primary) neoplasm, unspecified: Secondary | ICD-10-CM

## 2022-07-09 DIAGNOSIS — C8331 Diffuse large B-cell lymphoma, lymph nodes of head, face, and neck: Secondary | ICD-10-CM

## 2022-07-09 DIAGNOSIS — Z5189 Encounter for other specified aftercare: Secondary | ICD-10-CM

## 2022-07-09 DIAGNOSIS — F411 Generalized anxiety disorder: Secondary | ICD-10-CM

## 2022-07-09 DIAGNOSIS — C8590 Non-Hodgkin lymphoma, unspecified, unspecified site: Secondary | ICD-10-CM

## 2022-07-09 LAB — CBC WITH DIFFERENTIAL/PLATELET
Abs Immature Granulocytes: 0.12 10*3/uL — ABNORMAL HIGH (ref 0.00–0.07)
Basophils Absolute: 0.1 10*3/uL (ref 0.0–0.1)
Basophils Relative: 1 %
Eosinophils Absolute: 0.1 10*3/uL (ref 0.0–0.5)
Eosinophils Relative: 0 %
HCT: 37.2 % (ref 36.0–46.0)
Hemoglobin: 12.8 g/dL (ref 12.0–15.0)
Immature Granulocytes: 1 %
Lymphocytes Relative: 4 %
Lymphs Abs: 0.6 10*3/uL — ABNORMAL LOW (ref 0.7–4.0)
MCH: 32.7 pg (ref 26.0–34.0)
MCHC: 34.4 g/dL (ref 30.0–36.0)
MCV: 95.1 fL (ref 80.0–100.0)
Monocytes Absolute: 1 10*3/uL (ref 0.1–1.0)
Monocytes Relative: 7 %
Neutro Abs: 13.2 10*3/uL — ABNORMAL HIGH (ref 1.7–7.7)
Neutrophils Relative %: 87 %
Platelets: 206 10*3/uL (ref 150–400)
RBC: 3.91 MIL/uL (ref 3.87–5.11)
RDW: 15.8 % — ABNORMAL HIGH (ref 11.5–15.5)
WBC: 15 10*3/uL — ABNORMAL HIGH (ref 4.0–10.5)
nRBC: 0 % (ref 0.0–0.2)

## 2022-07-09 LAB — BASIC METABOLIC PANEL
Anion gap: 8 (ref 5–15)
BUN: 14 mg/dL (ref 8–23)
CO2: 26 mmol/L (ref 22–32)
Calcium: 9.1 mg/dL (ref 8.9–10.3)
Chloride: 99 mmol/L (ref 98–111)
Creatinine, Ser: 0.69 mg/dL (ref 0.44–1.00)
GFR, Estimated: >60 mL/min (ref >60)
Glucose, Bld: 158 mg/dL — ABNORMAL HIGH (ref 70–99)
Potassium: 4.2 mmol/L (ref 3.5–5.1)
Sodium: 133 mmol/L — ABNORMAL LOW (ref 135–145)

## 2022-07-09 LAB — LACTATE DEHYDROGENASE: LDH: 151 U/L (ref 98–192)

## 2022-07-09 NOTE — Patient Instructions (Signed)
Aquaphor or Eucerinto skin of neck for radiation

## 2022-07-09 NOTE — Progress Notes (Signed)
Patient states her taste has got worse. Patient states she is weak, poor appetite.Marland Kitchen

## 2022-07-09 NOTE — Progress Notes (Signed)
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 Biopsy is suggestive of fatty liver changes  # carcinoma of lung status post left upper lobe resection in July of 2014; Adenocarcinoma T1N0 M0 tumor EGFR positive  SURGICAL PATHOLOGY  CASE: [id number]  PATIENT: Julie Jennings  Surgical Pathology Report      Specimen Submitted:  A. Lymph node, left neck   Clinical History: New left cervical lymphadenopathy.  History of  lymphoma and lung cancer.  New lymphadenopathy    MAY 17th, 2023- [Dr.juengle]; A. LYMPH NODE, LEFT NECK; ULTRASOUND-GUIDED BIOPSY:  - FINDINGS COMPATIBLE WITH LARGE B-CELL LYMPHOMA.   Comment:  Core biopsy sections display lymphoid tissue with a somewhat effaced  immunoarchitecture.  Portions of nodal tissue are comprised of sheets of  small lymphocytes, while other areas display dense background fibrosis,  and aggregates of large abnormal lymphocytes with irregular nuclear  contours, open chromatin, visible nucleoli, and retraction artifact.   Immunohistochemical studies demonstrate diffuse positivity for CD20  within regions comprised of larger lymphocytes, compatible with B cells.  CD3 highlights a background of small T cells.  CK AE1/AE3 is negative  for metastatic carcinoma.  PAX5 displays dim, patchy marking of larger  abnormal B cells.  In addition, these B cells appear to display dim  expression of CD10, with  positivity for Bcl-2, BCL6, and Mum-1.  B cells  are negative for CD5.  CD30 displays increased marking.  C-Myc is  positive, staining greater than 40% of larger cells.  CD45 (LCA) is  diffusely positive, without aberrant loss of expression.   Concurrent flow cytometric studies demonstrate a CD10 positive  monoclonal B-cell population in a background of many polytypic B cells,  representing 3% of total viable lymphoid cells and 8% of B cells.  For  further details, see scanned report in [facility].   The patient's history is of diffuse large B-cell lymphoma, as well as  invasive adenocarcinoma of the lung are noted.  Biopsy sections  demonstrate an abnormal proliferation of large B cells, compatible with  involvement by a diffuse large B-cell lymphoma. Further  subclassification is difficult, secondary to limited tissue, however,  presence of C10 marking would suggest a germinal center immunophenotype.  In addition, there does appear to be expression of both Bcl-2 and c-myc,  which may suggest a more aggressive process. There is no evidence of  metastatic adenocarcinoma. FISH testing for prognostically significant  abnormalities of BCL2, BCL6, and MYC will be attempted, and reported as  an addendum.   IMPRESSION: 1. Enlarged hypermetabolic lymph nodes in the bilateral left greater than right neck and asymmetric right palatine tonsil hypermetabolism with associated soft tissue fullness on the CT images, all new since 2014 PET-CT, most compatible with recurrent lymphoma. Deauville category 5.  # MAY 12th, 2023- DLBCL- STAGE II [NO Bone marrow]; GCB; double expresser-mcy; Bcl-2; QNS-FISH.   # MAY 30th, 2023- RCEOP q 3 W x3-RT   Cancer of upper lobe of left lung (HCC)  Diffuse large  B-cell lymphoma of lymph nodes of neck ([place])  04/25/2022 Initial Diagnosis   Diffuse large B-cell lymphoma of lymph nodes of neck ([place])   04/26/2022 Cancer Staging   Staging form: Hodgkin and Non-Hodgkin  Lymphoma, AJCC 8th Edition - Clinical: Stage II - Signed by Cammie Sickle, MD on 04/26/2022 Histopathologic type: Malignant lymphoma, large B-cell, diffuse, NOS   05/08/2022 -  Chemotherapy   [name]       INTERVAL HISTORY: Patient is accompanied by her cousin [name].  She is ambulating independently.  Julie Jennings 71 y.o.  female pleasant patient with extreme anxiety; and diffuse large B cell lymphoma [prior history of 2005 DLCBL]  s/p cycle 2 of R-CEOP chemotherapy who returns to clinic for follow up.   In interim, patient has been seen by Symptom Management and social work as well as palliative care. She feels weak and tired. Has ongoing lost of taste/blunted of taste which has limited her oral intake. No nausea or vomiting. No new lumps, bumps, or night sweats. Says that her lymph nodes may be smaller. She is seeing Teresita for counseling and is followed by Dr. Modesta Messing for depression and anxiety.    Review of Systems  Constitutional:  Positive for malaise/fatigue and weight loss. Negative for chills and fever.  HENT:  Negative for hearing loss, nosebleeds, sore throat and tinnitus.   Eyes:  Negative for blurred vision and double vision.  Respiratory:  Negative for cough, hemoptysis, shortness of breath and wheezing.   Cardiovascular:  Negative for chest pain, palpitations and leg swelling.  Gastrointestinal:  Negative for abdominal pain, blood in stool, constipation, diarrhea, melena, nausea and vomiting.  Genitourinary:  Negative for dysuria and urgency.  Musculoskeletal:  Positive for joint pain. Negative for back pain, falls and myalgias.  Skin:  Negative for itching and rash.  Neurological:  Positive for weakness. Negative for dizziness, tingling, sensory change, loss of consciousness and headaches.  Endo/Heme/Allergies:  Negative for environmental allergies. Does not bruise/bleed easily.   Psychiatric/Behavioral:  Positive for depression. The patient is nervous/anxious. The patient does not have insomnia.      PAST MEDICAL HISTORY :  Past Medical History:  Diagnosis Date   A-fib ([place])    Only once   Anxiety    Arthritis    oesteoarthritis   BP (high blood pressure) 04/16/2014   Chronic kidney disease    history nephrolithiasis   Depression    Dysrhythmia    PSVT   Endometriosis    Herpes zoster    History of kidney stones    Lung cancer ([place]) left   Lymphoma ([place])    "stomach"   Stroke ([place])     PAST SURGICAL HISTORY :   Past Surgical History:  Procedure Laterality Date   AUGMENTATION MAMMAPLASTY Bilateral    CHOLECYSTECTOMY     COLONOSCOPY     COLONOSCOPY WITH PROPOFOL N/A 04/14/2018   Procedure: COLONOSCOPY WITH PROPOFOL;  Surgeon: Manya Silvas, MD;  Location: [facility] ENDOSCOPY;  Service: Endoscopy;  Laterality: N/A;   DILATION AND CURETTAGE OF UTERUS     HEMORRHOIDECTOMY WITH HEMORRHOID BANDING     HERNIA REPAIR     umbilical hernia   IR IMAGING GUIDED PORT INSERTION  05/04/2022   LUNG REMOVAL, PARTIAL Left    REVERSE SHOULDER ARTHROPLASTY Left 01/04/2022   Procedure: Left reverse shoulder arthroplasty, biceps tenodesis;  Surgeon: Leim Fabry, MD;  Location: [facility] ORS;  Service:  Orthopedics;  Laterality: Left;    FAMILY HISTORY :   Family History  Problem Relation Age of Onset   Stroke Mother    Diabetes Mother    Colon cancer Father    Prostate cancer Father    Breast cancer Neg Hx     SOCIAL HISTORY:   Social History   Tobacco Use   Smoking status: Never   Smokeless tobacco: Never  Vaping Use   Vaping Use: Never used  Substance Use Topics   Alcohol use: No   Drug use: No    ALLERGIES:  is allergic to tylenol [acetaminophen], buspirone, and codeine.  MEDICATIONS:  Current Outpatient Medications  Medication Sig Dispense Refill   acyclovir (ZOVIRAX) 400 MG tablet Take 1 tablet (400 mg total) by mouth 2 (two) times daily. 60  tablet 4   ALPRAZolam (XANAX) 0.5 MG tablet Take 1 tablet (0.5 mg total) by mouth 3 (three) times daily as needed for anxiety. (Patient taking differently: Take 0.5 mg by mouth 3 (three) times daily.) 21 tablet 0   aspirin 81 MG chewable tablet Chew by mouth.     furosemide (LASIX) 40 MG tablet Take 40 mg by mouth daily.     ketoconazole (NIZORAL) 2 % cream Apply to the feet QHS 60 g 3   lidocaine-prilocaine (EMLA) cream Apply on the port. 30 -45 min  prior to port access. 30 g 3   losartan (COZAAR) 100 MG tablet Take 100 mg by mouth daily.     Magnesium Cl-Calcium Carbonate (SLOW MAGNESIUM/CALCIUM) 70-117 MG TBEC Take 1 tablet by mouth 2 (two) times daily. 60 tablet 6   meloxicam (MOBIC) 15 MG tablet Take 1 tablet (15 mg total) by mouth daily. 30 tablet 0   nebivolol (BYSTOLIC) 10 MG tablet Take 10 mg by mouth daily.     ondansetron (ZOFRAN) 4 MG tablet Take 1 tablet (4 mg total) by mouth every 6 (six) hours as needed for nausea. 20 tablet 0   ondansetron (ZOFRAN) 8 MG tablet Every 8 hours as needed for nausea/vomitting. 40 tablet 2   pantoprazole (PROTONIX) 40 MG tablet Take 40 mg by mouth 2 (two) times daily.     Polyethyl Glycol-Propyl Glycol (SYSTANE) 0.4-0.3 % SOLN Place 1 drop into both eyes daily as needed (Dry eye).     potassium chloride (KLOR-CON M) 10 MEQ tablet Take 1 tablet (10 mEq total) by mouth daily. 30 tablet 0   potassium chloride (KLOR-CON) 10 MEQ tablet Take 10 mEq by mouth daily.     prochlorperazine (COMPAZINE) 10 MG tablet Take 1 tablet (10 mg total) by mouth every 6 (six) hours as needed for nausea or vomiting. 40 tablet 0   QUEtiapine (SEROQUEL) 25 MG tablet Take 1 tablet (25 mg total) by mouth at bedtime. 30 tablet 0   tiotropium (SPIRIVA) 18 MCG inhalation capsule Place 18 mcg into inhaler and inhale daily.     triamcinolone (NASACORT) 55 MCG/ACT AERO nasal inhaler Place 2 sprays into the nose daily.     venlafaxine XR (EFFEXOR-XR) 75 MG 24 hr capsule Take 3 capsules  (225 mg total) by mouth daily with breakfast. 90 capsule 1   No current facility-administered medications for this visit.    PHYSICAL EXAMINATION: ECOG PERFORMANCE STATUS: 0 - Asymptomatic  BP 128/89   Pulse 92   Temp 98.7 F (37.1 C)   Resp 19   Wt 192 lb 12.8 oz (87.5 kg)   SpO2 97%   BMI 31.12 kg/m  Filed Weights   07/09/22 1343  Weight: 192 lb 12.8 oz (87.5 kg)      Physical Exam Constitutional:      Appearance: She is not ill-appearing.  Eyes:     General: No scleral icterus.    Conjunctiva/sclera: Conjunctivae normal.  Cardiovascular:     Rate and Rhythm: Normal rate and regular rhythm.  Abdominal:     General: There is no distension.     Palpations: Abdomen is soft.     Tenderness: There is no abdominal tenderness. There is no guarding.  Musculoskeletal:        General: No deformity.     Right lower leg: No edema.     Left lower leg: No edema.  Lymphadenopathy:     Cervical: Cervical adenopathy (left neck 2 cm nontender palpable lymph node) present.  Skin:    General: Skin is warm and dry.  Neurological:     Mental Status: She is alert and oriented to person, place, and time. Mental status is at baseline.  Psychiatric:        Mood and Affect: Mood is anxious.        Behavior: Behavior is cooperative.      LABORATORY DATA:  I have reviewed the data as listed    Component Value Date/Time   NA 133 (L) 07/09/2022 1327   NA 139 03/14/2015 0856   K 4.2 07/09/2022 1327   K 4.1 03/14/2015 0856   CL 99 07/09/2022 1327   CL 103 03/14/2015 0856   CO2 26 07/09/2022 1327   CO2 28 03/14/2015 0856   GLUCOSE 158 (H) 07/09/2022 1327   GLUCOSE 111 (H) 03/14/2015 0856   BUN 14 07/09/2022 1327   BUN 19 03/14/2015 0856   CREATININE 0.69 07/09/2022 1327   CREATININE 0.56 03/14/2015 0856   CALCIUM 9.1 07/09/2022 1327   CALCIUM 9.3 03/14/2015 0856   PROT 6.8 06/25/2022 1210   PROT 7.7 03/14/2015 0856   ALBUMIN 3.6 06/25/2022 1210   ALBUMIN 4.3 03/14/2015  0856   AST 41 06/25/2022 1210   AST 80 (H) 03/14/2015 0856   ALT 48 (H) 06/25/2022 1210   ALT 87 (H) 03/14/2015 0856   ALKPHOS 122 06/25/2022 1210   ALKPHOS 164 (H) 03/14/2015 0856   BILITOT 0.7 06/25/2022 1210   BILITOT 0.8 03/14/2015 0856   GFRNONAA >60 07/09/2022 1327   GFRNONAA >60 03/14/2015 0856   GFRAA >60 07/07/2020 1041   GFRAA >60 03/14/2015 0856    No results found for: "SPEP", "UPEP"  Lab Results  Component Value Date   WBC 15.0 (H) 07/09/2022   NEUTROABS 13.2 (H) 07/09/2022   HGB 12.8 07/09/2022   HCT 37.2 07/09/2022   MCV 95.1 07/09/2022   PLT 206 07/09/2022    RADIOGRAPHIC STUDIES: I have personally reviewed the radiological images as listed and agreed with the findings in the report. No results found.   ASSESSMENT & PLAN:  No problem-specific Assessment & Plan notes found for this encounter.  Diffuse large B-cell lymphoma of lymph nodes of neck (HCC) # Diffuse B-cell lymphoma-bilateral neck left more than right PET scan.  Clinically stage II. Currently on R-CEOP chemotherapy every 3 weeks x 3 cycles-followed by involved field radiation. She has now received 3 cycles of chemotherapy and undergone simulation on 7/26. She has plans to start radiation with Dr. Baruch Gouty on 8/7.   # TLS prophylaxis- she will continue acyclovir prophylaxis  # Convalescence following chemotherapy- has been receiving intermittent IV fluids. She is  eating and drinking and says she doesn't feel she needs fluids today. Labs reviewed and kidney function is stable. No hypotension or orthostasis. Deaccess port today and hold iv fluids.   # abnormal LFTs- hepatitis panel was normal. LFTs not checked today. Follow up with Dr. Rogue Bussing.   # Radiation related skin changes- reviewed that she can use aquaphor or eucerin if she develops skin changes or irritation.   # Left foot rash- secondary to dryness and chemotherapy. Continue kenalog topical.   # Anxiety and depression related cancer  diagnosis- managed by Dr. Modesta Messing and counseling.   # Port- in place. Reviewed that she will need port flushes every 8 weeks   # DISPOSITION: Deaccess port Follow up as scheduled- la   No orders of the defined types were placed in this encounter.  All questions were answered. The patient knows to call the clinic with any problems, questions or concerns.      Verlon Au, NP 07/09/2022

## 2022-07-11 ENCOUNTER — Ambulatory Visit (HOSPITAL_COMMUNITY): Payer: Medicare PPO | Admitting: Student in an Organized Health Care Education/Training Program

## 2022-07-11 DIAGNOSIS — Z5111 Encounter for antineoplastic chemotherapy: Secondary | ICD-10-CM | POA: Insufficient documentation

## 2022-07-11 DIAGNOSIS — C8331 Diffuse large B-cell lymphoma, lymph nodes of head, face, and neck: Secondary | ICD-10-CM | POA: Insufficient documentation

## 2022-07-11 DIAGNOSIS — Z510 Encounter for antineoplastic radiation therapy: Secondary | ICD-10-CM | POA: Insufficient documentation

## 2022-07-11 DIAGNOSIS — K5900 Constipation, unspecified: Secondary | ICD-10-CM | POA: Insufficient documentation

## 2022-07-11 DIAGNOSIS — R531 Weakness: Secondary | ICD-10-CM | POA: Insufficient documentation

## 2022-07-11 DIAGNOSIS — Z7689 Persons encountering health services in other specified circumstances: Secondary | ICD-10-CM | POA: Insufficient documentation

## 2022-07-11 DIAGNOSIS — Z85118 Personal history of other malignant neoplasm of bronchus and lung: Secondary | ICD-10-CM | POA: Insufficient documentation

## 2022-07-13 ENCOUNTER — Other Ambulatory Visit: Payer: Self-pay | Admitting: *Deleted

## 2022-07-13 DIAGNOSIS — C8331 Diffuse large B-cell lymphoma, lymph nodes of head, face, and neck: Secondary | ICD-10-CM

## 2022-07-14 ENCOUNTER — Other Ambulatory Visit: Payer: Self-pay | Admitting: Internal Medicine

## 2022-07-14 MED ORDER — CIPROFLOXACIN HCL 500 MG PO TABS: 500.0000 mg | ORAL_TABLET | Freq: Two times a day (BID) | ORAL | 0 refills | Status: DC

## 2022-07-14 NOTE — Progress Notes (Signed)
Pt called- RN for symptoms of UTI. Called in a script for Cipro BID x3 days.   Informed pt to call us if symptoms don't get any better in next 2-3 days.  GB

## 2022-07-16 ENCOUNTER — Ambulatory Visit
Admission: RE | Admit: 2022-07-16 | Discharge: 2022-07-16 | Disposition: A | Discharge status: Home / Self Care | Payer: Medicare PPO | Source: Ambulatory Visit | Attending: Radiation Oncology | Admitting: Radiation Oncology

## 2022-07-17 ENCOUNTER — Ambulatory Visit
Admission: RE | Admit: 2022-07-17 | Discharge: 2022-07-17 | Disposition: A | Discharge status: Home / Self Care | Payer: Medicare PPO | Source: Ambulatory Visit | Attending: Radiation Oncology | Admitting: Radiation Oncology

## 2022-07-17 ENCOUNTER — Other Ambulatory Visit: Payer: Self-pay

## 2022-07-17 DIAGNOSIS — Z510 Encounter for antineoplastic radiation therapy: Secondary | ICD-10-CM | POA: Diagnosis not present

## 2022-07-17 LAB — RAD ONC ARIA SESSION SUMMARY
Course Elapsed Days: 0
Plan Fractions Treated to Date: 1
Plan Prescribed Dose Per Fraction: 2 Gy
Plan Total Fractions Prescribed: 15
Plan Total Prescribed Dose: 30 Gy
Reference Point Dosage Given to Date: 2 Gy
Reference Point Session Dosage Given: 2 Gy
Session Number: 1

## 2022-07-18 ENCOUNTER — Ambulatory Visit
Admission: RE | Admit: 2022-07-18 | Discharge: 2022-07-18 | Disposition: A | Discharge status: Home / Self Care | Payer: Medicare PPO | Source: Ambulatory Visit | Attending: Radiation Oncology | Admitting: Radiation Oncology

## 2022-07-18 ENCOUNTER — Other Ambulatory Visit: Payer: Self-pay

## 2022-07-18 DIAGNOSIS — Z510 Encounter for antineoplastic radiation therapy: Secondary | ICD-10-CM | POA: Diagnosis not present

## 2022-07-18 LAB — RAD ONC ARIA SESSION SUMMARY
Course Elapsed Days: 1
Plan Fractions Treated to Date: 2
Plan Prescribed Dose Per Fraction: 2 Gy
Plan Total Fractions Prescribed: 15
Plan Total Prescribed Dose: 30 Gy
Reference Point Dosage Given to Date: 4 Gy
Reference Point Session Dosage Given: 2 Gy
Session Number: 2

## 2022-07-19 ENCOUNTER — Inpatient Hospital Stay: Payer: Medicare PPO | Attending: Internal Medicine

## 2022-07-19 ENCOUNTER — Ambulatory Visit
Admission: RE | Admit: 2022-07-19 | Discharge: 2022-07-19 | Disposition: A | Discharge status: Home / Self Care | Payer: Medicare PPO | Source: Ambulatory Visit | Attending: Radiation Oncology | Admitting: Radiation Oncology

## 2022-07-19 ENCOUNTER — Inpatient Hospital Stay: Payer: Medicare PPO | Admitting: Licensed Clinical Social Worker

## 2022-07-19 ENCOUNTER — Other Ambulatory Visit: Payer: Self-pay

## 2022-07-19 DIAGNOSIS — Z510 Encounter for antineoplastic radiation therapy: Secondary | ICD-10-CM | POA: Diagnosis not present

## 2022-07-19 DIAGNOSIS — C8331 Diffuse large B-cell lymphoma, lymph nodes of head, face, and neck: Secondary | ICD-10-CM

## 2022-07-19 LAB — RAD ONC ARIA SESSION SUMMARY
Course Elapsed Days: 2
Plan Fractions Treated to Date: 3
Plan Prescribed Dose Per Fraction: 2 Gy
Plan Total Fractions Prescribed: 15
Plan Total Prescribed Dose: 30 Gy
Reference Point Dosage Given to Date: 6 Gy
Reference Point Session Dosage Given: 2 Gy
Session Number: 3

## 2022-07-19 LAB — CBC
HCT: 37.9 % (ref 36.0–46.0)
Hemoglobin: 12.9 g/dL (ref 12.0–15.0)
MCH: 32.8 pg (ref 26.0–34.0)
MCHC: 34 g/dL (ref 30.0–36.0)
MCV: 96.4 fL (ref 80.0–100.0)
Platelets: 238 10*3/uL (ref 150–400)
RBC: 3.93 MIL/uL (ref 3.87–5.11)
RDW: 15.3 % (ref 11.5–15.5)
WBC: 10.2 10*3/uL (ref 4.0–10.5)
nRBC: 0 % (ref 0.0–0.2)

## 2022-07-20 ENCOUNTER — Other Ambulatory Visit: Payer: Self-pay | Admitting: *Deleted

## 2022-07-20 ENCOUNTER — Other Ambulatory Visit: Payer: Self-pay

## 2022-07-20 ENCOUNTER — Ambulatory Visit
Admission: RE | Admit: 2022-07-20 | Discharge: 2022-07-20 | Disposition: A | Discharge status: Home / Self Care | Payer: Medicare PPO | Source: Ambulatory Visit | Attending: Radiation Oncology | Admitting: Radiation Oncology

## 2022-07-20 DIAGNOSIS — Z510 Encounter for antineoplastic radiation therapy: Secondary | ICD-10-CM | POA: Diagnosis not present

## 2022-07-20 LAB — RAD ONC ARIA SESSION SUMMARY
Course Elapsed Days: 3
Plan Fractions Treated to Date: 4
Plan Prescribed Dose Per Fraction: 2 Gy
Plan Total Fractions Prescribed: 15
Plan Total Prescribed Dose: 30 Gy
Reference Point Dosage Given to Date: 8 Gy
Reference Point Session Dosage Given: 2 Gy
Session Number: 4

## 2022-07-23 ENCOUNTER — Ambulatory Visit
Admission: RE | Admit: 2022-07-23 | Discharge: 2022-07-23 | Disposition: A | Discharge status: Home / Self Care | Payer: Medicare PPO | Source: Ambulatory Visit | Attending: Radiation Oncology | Admitting: Radiation Oncology

## 2022-07-23 ENCOUNTER — Other Ambulatory Visit: Payer: Self-pay

## 2022-07-23 DIAGNOSIS — Z510 Encounter for antineoplastic radiation therapy: Secondary | ICD-10-CM | POA: Diagnosis not present

## 2022-07-23 LAB — RAD ONC ARIA SESSION SUMMARY
Course Elapsed Days: 6
Plan Fractions Treated to Date: 5
Plan Prescribed Dose Per Fraction: 2 Gy
Plan Total Fractions Prescribed: 15
Plan Total Prescribed Dose: 30 Gy
Reference Point Dosage Given to Date: 10 Gy
Reference Point Session Dosage Given: 2 Gy
Session Number: 5

## 2022-07-24 ENCOUNTER — Other Ambulatory Visit: Payer: Self-pay

## 2022-07-24 ENCOUNTER — Other Ambulatory Visit: Payer: Self-pay | Admitting: *Deleted

## 2022-07-24 ENCOUNTER — Ambulatory Visit
Admission: RE | Admit: 2022-07-24 | Discharge: 2022-07-24 | Disposition: A | Discharge status: Home / Self Care | Payer: Medicare PPO | Source: Ambulatory Visit | Attending: Radiation Oncology | Admitting: Radiation Oncology

## 2022-07-24 DIAGNOSIS — Z510 Encounter for antineoplastic radiation therapy: Secondary | ICD-10-CM | POA: Diagnosis not present

## 2022-07-24 LAB — RAD ONC ARIA SESSION SUMMARY
Course Elapsed Days: 7
Plan Fractions Treated to Date: 6
Plan Prescribed Dose Per Fraction: 2 Gy
Plan Total Fractions Prescribed: 15
Plan Total Prescribed Dose: 30 Gy
Reference Point Dosage Given to Date: 12 Gy
Reference Point Session Dosage Given: 2 Gy
Session Number: 6

## 2022-07-24 MED ORDER — SUCRALFATE 1 G PO TABS: 1.0000 g | ORAL_TABLET | Freq: Three times a day (TID) | ORAL | 1 refills | Status: DC

## 2022-07-25 ENCOUNTER — Inpatient Hospital Stay: Payer: Medicare PPO | Admitting: Occupational Therapy

## 2022-07-25 ENCOUNTER — Ambulatory Visit
Admission: RE | Admit: 2022-07-25 | Discharge: 2022-07-25 | Disposition: A | Discharge status: Home / Self Care | Payer: Medicare PPO | Source: Ambulatory Visit | Attending: Radiation Oncology | Admitting: Radiation Oncology

## 2022-07-25 ENCOUNTER — Other Ambulatory Visit: Payer: Self-pay

## 2022-07-25 DIAGNOSIS — Z510 Encounter for antineoplastic radiation therapy: Secondary | ICD-10-CM | POA: Diagnosis not present

## 2022-07-25 LAB — RAD ONC ARIA SESSION SUMMARY
Course Elapsed Days: 8
Plan Fractions Treated to Date: 7
Plan Prescribed Dose Per Fraction: 2 Gy
Plan Total Fractions Prescribed: 15
Plan Total Prescribed Dose: 30 Gy
Reference Point Dosage Given to Date: 14 Gy
Reference Point Session Dosage Given: 2 Gy
Session Number: 7

## 2022-07-26 ENCOUNTER — Ambulatory Visit
Admission: RE | Admit: 2022-07-26 | Discharge: 2022-07-26 | Disposition: A | Discharge status: Home / Self Care | Payer: Medicare PPO | Source: Ambulatory Visit | Attending: Radiation Oncology | Admitting: Radiation Oncology

## 2022-07-26 ENCOUNTER — Other Ambulatory Visit: Payer: Self-pay

## 2022-07-26 ENCOUNTER — Inpatient Hospital Stay: Payer: Medicare PPO

## 2022-07-26 ENCOUNTER — Telehealth: Payer: Self-pay | Admitting: Psychiatry

## 2022-07-26 ENCOUNTER — Other Ambulatory Visit: Payer: Self-pay | Admitting: Psychiatry

## 2022-07-26 DIAGNOSIS — C8331 Diffuse large B-cell lymphoma, lymph nodes of head, face, and neck: Secondary | ICD-10-CM

## 2022-07-26 DIAGNOSIS — Z510 Encounter for antineoplastic radiation therapy: Secondary | ICD-10-CM | POA: Diagnosis not present

## 2022-07-26 LAB — RAD ONC ARIA SESSION SUMMARY
Course Elapsed Days: 9
Plan Fractions Treated to Date: 8
Plan Prescribed Dose Per Fraction: 2 Gy
Plan Total Fractions Prescribed: 15
Plan Total Prescribed Dose: 30 Gy
Reference Point Dosage Given to Date: 16 Gy
Reference Point Session Dosage Given: 2 Gy
Session Number: 8

## 2022-07-26 LAB — CBC
HCT: 37.5 % (ref 36.0–46.0)
Hemoglobin: 12.5 g/dL (ref 12.0–15.0)
MCH: 32.4 pg (ref 26.0–34.0)
MCHC: 33.3 g/dL (ref 30.0–36.0)
MCV: 97.2 fL (ref 80.0–100.0)
Platelets: 204 10*3/uL (ref 150–400)
RBC: 3.86 MIL/uL — ABNORMAL LOW (ref 3.87–5.11)
RDW: 14.7 % (ref 11.5–15.5)
WBC: 9.5 10*3/uL (ref 4.0–10.5)
nRBC: 0 % (ref 0.0–0.2)

## 2022-07-26 NOTE — Telephone Encounter (Signed)
Ordered quetiapine.

## 2022-07-26 NOTE — Telephone Encounter (Signed)
Patient cousin, Lucillie Garfinkel, called. She is on ROI. Patient stated that she will be out of Seroquel 25 mg. Could not keep appt for August due to stating it is too much for her with treatments she is going through. Pushed appointment out to October, which is after treatment and will call to cancel if not needed. But in the meantime, will you be able to refill this medication she is asking. Please advise.

## 2022-07-27 ENCOUNTER — Ambulatory Visit: Admission: RE | Admit: 2022-07-27 | Payer: Medicare PPO | Source: Ambulatory Visit

## 2022-07-27 ENCOUNTER — Ambulatory Visit: Payer: Medicare PPO

## 2022-07-30 ENCOUNTER — Ambulatory Visit: Payer: Medicare PPO

## 2022-07-30 ENCOUNTER — Ambulatory Visit: Payer: Medicare PPO | Admitting: Psychiatry

## 2022-07-30 ENCOUNTER — Inpatient Hospital Stay: Payer: Medicare PPO

## 2022-07-30 ENCOUNTER — Inpatient Hospital Stay: Payer: Medicare PPO | Admitting: Nurse Practitioner

## 2022-07-30 ENCOUNTER — Telehealth: Payer: Self-pay | Admitting: *Deleted

## 2022-07-30 NOTE — Telephone Encounter (Signed)
Julie Jennings has added pt to schedule.

## 2022-07-30 NOTE — Telephone Encounter (Signed)
Patient called to cancel her radiation appointment secondary to multiple medical problems. She reports a sore mouth that is burning, her throat is sore she has been unable to eat or drink much fluid. She also reports that she has been gagging and vomiting up small amounts. Patient is agreeable to see SCM today after her port flush at 2:10PM. She also said that she has been using the carafate as prescribed and that it does not help.

## 2022-07-31 ENCOUNTER — Ambulatory Visit: Payer: Medicare PPO

## 2022-07-31 ENCOUNTER — Other Ambulatory Visit: Payer: Self-pay | Admitting: *Deleted

## 2022-07-31 MED ORDER — FLUCONAZOLE 100 MG PO TABS: 100.0000 mg | ORAL_TABLET | Freq: Every day | ORAL | 0 refills | Status: DC

## 2022-08-01 ENCOUNTER — Ambulatory Visit: Payer: Medicare PPO

## 2022-08-01 NOTE — Telephone Encounter (Signed)
Julie Jennings, patient showed up yesterday after her radiation and requested a port flush only. She refused to see the provider and r/s her no show apt. [name] spoke with patient and explained to her that she was not due for her port flush until Sept. Pt declined to see any provider and left clinic.

## 2022-08-02 ENCOUNTER — Ambulatory Visit: Payer: Medicare PPO

## 2022-08-03 ENCOUNTER — Ambulatory Visit: Payer: Medicare PPO

## 2022-08-06 ENCOUNTER — Other Ambulatory Visit: Payer: Self-pay

## 2022-08-06 ENCOUNTER — Ambulatory Visit: Payer: Medicare PPO

## 2022-08-06 ENCOUNTER — Ambulatory Visit
Admission: RE | Admit: 2022-08-06 | Discharge: 2022-08-06 | Disposition: A | Discharge status: Home / Self Care | Payer: Medicare PPO | Source: Ambulatory Visit | Attending: Radiation Oncology | Admitting: Radiation Oncology

## 2022-08-06 DIAGNOSIS — Z510 Encounter for antineoplastic radiation therapy: Secondary | ICD-10-CM | POA: Diagnosis not present

## 2022-08-06 LAB — RAD ONC ARIA SESSION SUMMARY
Course Elapsed Days: 20
Plan Fractions Treated to Date: 9
Plan Prescribed Dose Per Fraction: 2 Gy
Plan Total Fractions Prescribed: 15
Plan Total Prescribed Dose: 30 Gy
Reference Point Dosage Given to Date: 18 Gy
Reference Point Session Dosage Given: 2 Gy
Session Number: 9

## 2022-08-07 ENCOUNTER — Ambulatory Visit
Admission: RE | Admit: 2022-08-07 | Discharge: 2022-08-07 | Disposition: A | Discharge status: Home / Self Care | Payer: Medicare PPO | Source: Ambulatory Visit | Attending: Radiation Oncology | Admitting: Radiation Oncology

## 2022-08-07 ENCOUNTER — Ambulatory Visit: Payer: Medicare PPO

## 2022-08-07 ENCOUNTER — Other Ambulatory Visit: Payer: Self-pay

## 2022-08-07 DIAGNOSIS — Z510 Encounter for antineoplastic radiation therapy: Secondary | ICD-10-CM | POA: Diagnosis not present

## 2022-08-07 LAB — RAD ONC ARIA SESSION SUMMARY
Course Elapsed Days: 21
Plan Fractions Treated to Date: 10
Plan Prescribed Dose Per Fraction: 2 Gy
Plan Total Fractions Prescribed: 15
Plan Total Prescribed Dose: 30 Gy
Reference Point Dosage Given to Date: 20 Gy
Reference Point Session Dosage Given: 2 Gy
Session Number: 10

## 2022-08-08 ENCOUNTER — Other Ambulatory Visit: Payer: Self-pay

## 2022-08-08 ENCOUNTER — Ambulatory Visit
Admission: RE | Admit: 2022-08-08 | Discharge: 2022-08-08 | Disposition: A | Discharge status: Home / Self Care | Payer: Medicare PPO | Source: Ambulatory Visit | Attending: Radiation Oncology | Admitting: Radiation Oncology

## 2022-08-08 ENCOUNTER — Ambulatory Visit: Payer: Medicare PPO

## 2022-08-08 DIAGNOSIS — Z510 Encounter for antineoplastic radiation therapy: Secondary | ICD-10-CM | POA: Diagnosis not present

## 2022-08-08 LAB — RAD ONC ARIA SESSION SUMMARY
Course Elapsed Days: 22
Plan Fractions Treated to Date: 11
Plan Prescribed Dose Per Fraction: 2 Gy
Plan Total Fractions Prescribed: 15
Plan Total Prescribed Dose: 30 Gy
Reference Point Dosage Given to Date: 22 Gy
Reference Point Session Dosage Given: 2 Gy
Session Number: 11

## 2022-08-09 ENCOUNTER — Ambulatory Visit: Payer: Medicare PPO

## 2022-08-10 ENCOUNTER — Other Ambulatory Visit: Payer: Self-pay

## 2022-08-10 ENCOUNTER — Ambulatory Visit
Admission: RE | Admit: 2022-08-10 | Discharge: 2022-08-10 | Disposition: A | Discharge status: Home / Self Care | Payer: Medicare PPO | Source: Ambulatory Visit | Attending: Radiation Oncology | Admitting: Radiation Oncology

## 2022-08-10 DIAGNOSIS — K5900 Constipation, unspecified: Secondary | ICD-10-CM | POA: Diagnosis not present

## 2022-08-10 DIAGNOSIS — Z7689 Persons encountering health services in other specified circumstances: Secondary | ICD-10-CM | POA: Insufficient documentation

## 2022-08-10 DIAGNOSIS — C8331 Diffuse large B-cell lymphoma, lymph nodes of head, face, and neck: Secondary | ICD-10-CM | POA: Insufficient documentation

## 2022-08-10 DIAGNOSIS — Z85118 Personal history of other malignant neoplasm of bronchus and lung: Secondary | ICD-10-CM | POA: Insufficient documentation

## 2022-08-10 DIAGNOSIS — Z5111 Encounter for antineoplastic chemotherapy: Secondary | ICD-10-CM | POA: Insufficient documentation

## 2022-08-10 DIAGNOSIS — Z510 Encounter for antineoplastic radiation therapy: Secondary | ICD-10-CM | POA: Diagnosis not present

## 2022-08-10 DIAGNOSIS — R531 Weakness: Secondary | ICD-10-CM | POA: Insufficient documentation

## 2022-08-10 LAB — RAD ONC ARIA SESSION SUMMARY
Course Elapsed Days: 24
Plan Fractions Treated to Date: 12
Plan Prescribed Dose Per Fraction: 2 Gy
Plan Total Fractions Prescribed: 15
Plan Total Prescribed Dose: 30 Gy
Reference Point Dosage Given to Date: 24 Gy
Reference Point Session Dosage Given: 2 Gy
Session Number: 12

## 2022-08-14 ENCOUNTER — Ambulatory Visit
Admission: RE | Admit: 2022-08-14 | Discharge: 2022-08-14 | Disposition: A | Discharge status: Home / Self Care | Payer: Medicare PPO | Source: Ambulatory Visit | Attending: Radiation Oncology | Admitting: Radiation Oncology

## 2022-08-14 ENCOUNTER — Ambulatory Visit: Payer: Medicare PPO

## 2022-08-14 ENCOUNTER — Other Ambulatory Visit: Payer: Self-pay

## 2022-08-14 DIAGNOSIS — Z510 Encounter for antineoplastic radiation therapy: Secondary | ICD-10-CM | POA: Diagnosis not present

## 2022-08-14 LAB — RAD ONC ARIA SESSION SUMMARY
Course Elapsed Days: 28
Plan Fractions Treated to Date: 13
Plan Prescribed Dose Per Fraction: 2 Gy
Plan Total Fractions Prescribed: 15
Plan Total Prescribed Dose: 30 Gy
Reference Point Dosage Given to Date: 26 Gy
Reference Point Session Dosage Given: 2 Gy
Session Number: 13

## 2022-08-15 ENCOUNTER — Other Ambulatory Visit: Payer: Self-pay

## 2022-08-15 ENCOUNTER — Ambulatory Visit: Payer: Medicare PPO

## 2022-08-15 ENCOUNTER — Other Ambulatory Visit: Payer: Self-pay | Admitting: Internal Medicine

## 2022-08-15 ENCOUNTER — Ambulatory Visit
Admission: RE | Admit: 2022-08-15 | Discharge: 2022-08-15 | Disposition: A | Discharge status: Home / Self Care | Payer: Medicare PPO | Source: Ambulatory Visit | Attending: Radiation Oncology | Admitting: Radiation Oncology

## 2022-08-15 ENCOUNTER — Other Ambulatory Visit: Payer: Self-pay | Admitting: *Deleted

## 2022-08-15 DIAGNOSIS — C8590 Non-Hodgkin lymphoma, unspecified, unspecified site: Secondary | ICD-10-CM

## 2022-08-15 DIAGNOSIS — Z510 Encounter for antineoplastic radiation therapy: Secondary | ICD-10-CM | POA: Diagnosis not present

## 2022-08-15 LAB — RAD ONC ARIA SESSION SUMMARY
Course Elapsed Days: 29
Plan Fractions Treated to Date: 14
Plan Prescribed Dose Per Fraction: 2 Gy
Plan Total Fractions Prescribed: 15
Plan Total Prescribed Dose: 30 Gy
Reference Point Dosage Given to Date: 28 Gy
Reference Point Session Dosage Given: 2 Gy
Session Number: 14

## 2022-08-15 MED ORDER — ALPRAZOLAM 0.5 MG PO TABS: 0.5000 mg | ORAL_TABLET | Freq: Three times a day (TID) | ORAL | 0 refills | Status: DC

## 2022-08-16 ENCOUNTER — Ambulatory Visit
Admission: RE | Admit: 2022-08-16 | Discharge: 2022-08-16 | Disposition: A | Discharge status: Home / Self Care | Payer: Medicare PPO | Source: Ambulatory Visit | Attending: Radiation Oncology | Admitting: Radiation Oncology

## 2022-08-16 ENCOUNTER — Ambulatory Visit: Payer: Medicare PPO

## 2022-08-16 ENCOUNTER — Other Ambulatory Visit: Payer: Self-pay

## 2022-08-16 DIAGNOSIS — Z510 Encounter for antineoplastic radiation therapy: Secondary | ICD-10-CM | POA: Diagnosis not present

## 2022-08-16 LAB — RAD ONC ARIA SESSION SUMMARY
Course Elapsed Days: 30
Plan Fractions Treated to Date: 15
Plan Prescribed Dose Per Fraction: 2 Gy
Plan Total Fractions Prescribed: 15
Plan Total Prescribed Dose: 30 Gy
Reference Point Dosage Given to Date: 30 Gy
Reference Point Session Dosage Given: 2 Gy
Session Number: 15

## 2022-08-23 ENCOUNTER — Encounter: Payer: Self-pay | Admitting: Internal Medicine

## 2022-08-23 ENCOUNTER — Telehealth: Payer: Self-pay

## 2022-08-23 NOTE — Telephone Encounter (Signed)
Lab orders entered

## 2022-08-23 NOTE — Telephone Encounter (Signed)
-----   Message from Charlotte Sanes sent at 08/23/2022  1:38 PM EDT ----- Scheduled and patient notified via voicemail ----- Message ----- From: Cammie Sickle, MD Sent: 08/23/2022  12:54 PM EDT To: Vanice Sarah, CMA; Verlon Au, NP; #  Thanks for the follow up, Lauren.  BC- please have the pt follow up in 4 weeks- MD; labs- cbc/cmp;[facility]-Dr.B  ----- Message ----- From: Verlon Au, NP Sent: 08/23/2022   9:19 AM EDT To: Vanice Sarah, CMA; Gloris Ham, RN; #  Patient completed radiation 9/7. She doesn't currently have follow up scheduled with you (or APP). Can I coordinate something for her? Thanks!

## 2022-08-28 ENCOUNTER — Telehealth: Payer: Self-pay

## 2022-08-28 NOTE — Telephone Encounter (Signed)
Patient does have an upcoming appointment on 09/10/2022 with Dr. Modesta Messing.  Please advise patient to discuss this with Dr. Modesta Messing, to come up with a plan to taper it down.

## 2022-08-28 NOTE — Telephone Encounter (Signed)
pt need taking the quetiapine 25mg  during her cancer treatments and now that the treatment is over she wants to know how to come off the medication.

## 2022-08-28 NOTE — Telephone Encounter (Signed)
pt notified to stay on medication until her next appt with dr. Modesta Messing in october.

## 2022-09-08 NOTE — Progress Notes (Deleted)
BH MD/PA/NP OP Progress Note  09/08/2022 11:13 AM Julie Jennings  MRN:  [id number]  Chief Complaint: No chief complaint on file.  HPI:  - She completed radiation since the last visit.  Visit Diagnosis: No diagnosis found.  Past Psychiatric History: Please see initial evaluation for full details. I have reviewed the history. No updates at this time.     Past Medical History:  Past Medical History:  Diagnosis Date   A-fib ([place])    Only once   Anxiety    Arthritis    oesteoarthritis   BP (high blood pressure) 04/16/2014   Chronic kidney disease    history nephrolithiasis   Depression    Dysrhythmia    PSVT   Endometriosis    Herpes zoster    History of kidney stones    Lung cancer ([place]) left   Lymphoma ([place])    "stomach"   Stroke [facility])     Past Surgical History:  Procedure Laterality Date   AUGMENTATION MAMMAPLASTY Bilateral    CHOLECYSTECTOMY     COLONOSCOPY     COLONOSCOPY WITH PROPOFOL N/A 04/14/2018   Procedure: COLONOSCOPY WITH PROPOFOL;  Surgeon: Manya Silvas, MD;  Location[facility] ENDOSCOPY;  Service: Endoscopy;  Laterality: N/A;   DILATION AND CURETTAGE OF UTERUS     HEMORRHOIDECTOMY WITH HEMORRHOID BANDING     HERNIA REPAIR     umbilical hernia   IR IMAGING GUIDED PORT INSERTION  05/04/2022   LUNG REMOVAL, PARTIAL Left    REVERSE SHOULDER ARTHROPLASTY Left 01/04/2022   Procedure: Left reverse shoulder arthroplasty, biceps tenodesis;  Surgeon: Leim Fabry, MD;  Location: [facility] ORS;  Service: Orthopedics;  Laterality: Left;    Family Psychiatric History: Please see initial evaluation for full details. I have reviewed the history. No updates at this time.     Family History:  Family History  Problem Relation Age of Onset   Stroke Mother    Diabetes Mother    Colon cancer Father    Prostate cancer Father    Breast cancer Neg Hx     Social History:  Social History   Socioeconomic History   Marital status: Widowed    Spouse name: Not on file    Number of children: 0   Years of education: Not on file   Highest education level: Some college, no degree  Occupational History   Not on file  Tobacco Use   Smoking status: Never   Smokeless tobacco: Never  Vaping Use   Vaping Use: Never used  Substance and Sexual Activity   Alcohol use: No   Drug use: No   Sexual activity: Not Currently    Birth control/protection: Post-menopausal  Other Topics Concern   Not on file  Social History Narrative   Not on file   Social Determinants of Health   Financial Resource Strain: Low Risk  (07/05/2022)   Overall Financial Resource Strain (CARDIA)    Difficulty of Paying Living Expenses: Not hard at all  Food Insecurity: No Food Insecurity (07/05/2022)   Hunger Vital Sign    Worried About Running Out of Food in the Last Year: Never true    Ran Out of Food in the Last Year: Never true  Transportation Needs: No Transportation Needs (07/05/2022)   PRAPARE - Hydrologist (Medical): No    Lack of Transportation (Non-Medical): No  Physical Activity: Inactive (07/05/2022)   Exercise Vital Sign    Days of Exercise per Week: 0 days  Minutes of Exercise per Session: 0 min  Stress: Stress Concern Present (07/05/2022)   [place]    Feeling of Stress : Rather much  Social Connections: Socially Isolated (07/05/2022)   Social Connection and Isolation Panel [NHANES]    Frequency of Communication with Friends and Family: More than three times a week    Frequency of Social Gatherings with Friends and Family: More than three times a week    Attends Religious Services: Never    Marine scientist or Organizations: No    Attends Music therapist: Not on file    Marital Status: Widowed    Allergies:  Allergies  Allergen Reactions   Tylenol [Acetaminophen] Other (See Comments)    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 60 tablet 4   ALPRAZolam (XANAX) 0.5 MG tablet Take 1 tablet (0.5 mg total) by mouth 3 (three) times daily. 21 tablet 0   aspirin 81 MG chewable tablet Chew by mouth.     ciprofloxacin (CIPRO) 500 MG tablet Take 1 tablet (500 mg total) by mouth 2 (two) times daily. 6 tablet 0   fluconazole (DIFLUCAN) 100 MG tablet Take 1 tablet (100 mg total) by mouth daily. 7 tablet 0   furosemide (LASIX) 40 MG tablet Take 40 mg by mouth daily.     ketoconazole (NIZORAL) 2 % cream Apply to the feet QHS 60 g 3   lidocaine-prilocaine (EMLA) cream Apply on the port. 30 -45 min  prior to port access. 30 g 3   losartan (COZAAR) 100 MG tablet Take 100 mg by mouth daily.     Magnesium Cl-Calcium Carbonate (SLOW MAGNESIUM/CALCIUM) 70-117 MG TBEC Take 1 tablet by mouth 2 (two) times daily. 60 tablet 6   meloxicam (MOBIC) 15 MG tablet Take 1 tablet (15 mg total) by mouth daily. 30 tablet 0   nebivolol (BYSTOLIC) 10 MG tablet Take 10 mg by mouth daily.     ondansetron (ZOFRAN) 4 MG tablet Take 1 tablet (4 mg total) by mouth every 6 (six) hours as needed for nausea. 20 tablet 0   ondansetron (ZOFRAN) 8 MG tablet Every 8 hours as needed for nausea/vomitting. 40 tablet 2   pantoprazole (PROTONIX) 40 MG tablet Take 40 mg by mouth 2 (two) times daily.     Polyethyl Glycol-Propyl Glycol (SYSTANE) 0.4-0.3 % SOLN Place 1 drop into both eyes daily as needed (Dry eye).      potassium chloride (KLOR-CON M) 10 MEQ tablet Take 1 tablet (10 mEq total) by mouth daily. 30 tablet 0   potassium chloride (KLOR-CON) 10 MEQ tablet Take 10 mEq by mouth daily.     prochlorperazine (COMPAZINE) 10 MG tablet Take 1 tablet (10 mg total) by mouth every 6 (six) hours as needed for nausea or vomiting. 40 tablet 0   QUEtiapine (SEROQUEL) 25 MG tablet Take 1 tablet (25 mg total)  by mouth at bedtime. 90 tablet 0   sucralfate (CARAFATE) 1 g tablet Take 1 tablet (1 g total) by mouth 4 (four) times daily -  with meals and at bedtime. Dissolve medication in 3 to 4 tablespoons of warm water. Swish and swallow. 90 tablet 1   tiotropium (SPIRIVA) 18 MCG inhalation capsule Place 18 mcg into inhaler and inhale daily.     triamcinolone (NASACORT) 55 MCG/ACT AERO nasal inhaler Place 2 sprays into the nose daily.     venlafaxine XR (EFFEXOR-XR) 75 MG 24 hr capsule Take 3 capsules (225 mg total) by mouth daily with breakfast. 90 capsule 1   No current facility-administered medications for this visit.     Musculoskeletal: Strength & Muscle Tone: within normal limits Gait & Station: normal Patient leans: N/A  Psychiatric Specialty Exam: Review of Systems  There were no vitals taken for this visit.There is no height or weight on file to calculate BMI.  General Appearance: {Appearance:22683}  Eye Contact:  {BHH EYE CONTACT:22684}  Speech:  Clear and Coherent  Volume:  Normal  Mood:  {BHH MOOD:22306}  Affect:  {Affect (PAA):22687}  Thought Process:  Coherent  Orientation:  Full (Time, Place, and Person)  Thought Content: Logical   Suicidal Thoughts:  {ST/HT (PAA):22692}  Homicidal Thoughts:  {ST/HT (PAA):22692}  Memory:  Immediate;   Good  Judgement:  {Judgement (PAA):22694}  Insight:  {Insight (PAA):22695}  Psychomotor Activity:  Normal  Concentration:  Concentration: Good and Attention Span: Good  Recall:  Good  Fund of Knowledge: Good  Language: Good  Akathisia:  No  Handed:  Right   AIMS (if indicated): not done  Assets:  Communication Skills Desire for Improvement  ADL's:  Intact  Cognition: WNL  Sleep:  {BHH GOOD/FAIR/POOR:22877}   Screenings: GAD-7    Flowsheet Row Office Visit from 07/03/2022 in [place]  Total GAD-7 Score 19      PHQ2-9    [place] from 07/05/2022 in [place] at [place] Visit from 07/03/2022 in [place]  PHQ-2 Total Score 4 6  PHQ-9 Total Score 13 22      Flowsheet Row Admission (Discharged) from 01/04/2022 in [place] (1A) Pre-Admission Testing 60 from 01/03/2022 in [place] from 12/25/2021 in [place] No Risk No Risk No Risk        Assessment and Plan:  Jaira Canady is a 71 y.o. 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   1. Anxiety 2. MDD (major depressive disorder), recurrent episode, moderate ([place]) She reports significant worsening in anxiety and depressive symptoms in the context of undergoing chemotherapy/experiencing physical symptoms from the treatment for the past few months.  Other psychosocial stressors includes loss of her husband a few years ago, and her parents.  Will add quetiapine to target depression, anxiety, insomnia and an appetite loss.  Discussed potential metabolic side effect and EPS.  Will continue venlafaxine at the current dose given it has been recently uptitrated.  Noted that although she may benefit from lorazepam, she reportedly had side effect from this medication. Will continue current dose of Xanax at this time for anxiety.     Plan Continue venlafaxine  225 mg daily  Start quetiapine 25 mg at night  Continue Xanax 0.5 mg 3 times a day as needed for anxiety Next appointment: 8/21 at 1 PM for 30 mins, in person   The patient demonstrates the following risk factors for suicide: Chronic risk factors for suicide include: psychiatric disorder of depression, anxiety . Acute risk factors for suicide include: loss (financial, interpersonal, professional). Protective factors for this patient include: positive social support, coping skills, hope for the future, and religious beliefs against suicide. Considering these factors, the overall suicide risk at this point appears to be low. Patient is appropriate for outpatient follow up.        Collaboration of Care: Collaboration of Care: {BH OP Collaboration of Care:21014065}  Patient/Guardian was advised Release of Information must be obtained prior to any record release in order to collaborate their care with an outside provider. Patient/Guardian was advised if they have not already done so to contact the registration department to sign all necessary forms in order for [place] to release information regarding their care.   Consent: Patient/Guardian gives verbal consent for treatment and assignment of benefits for services provided during this visit. Patient/Guardian expressed understanding and agreed to proceed.    Norman Clay, MD 09/08/2022, 11:13 AM

## 2022-09-10 ENCOUNTER — Other Ambulatory Visit: Payer: Self-pay | Admitting: Internal Medicine

## 2022-09-10 ENCOUNTER — Ambulatory Visit: Payer: Medicare PPO | Admitting: Psychiatry

## 2022-09-11 ENCOUNTER — Other Ambulatory Visit: Payer: Self-pay

## 2022-09-13 ENCOUNTER — Other Ambulatory Visit: Payer: Self-pay

## 2022-09-18 ENCOUNTER — Ambulatory Visit: Payer: Medicare PPO | Admitting: Nurse Practitioner

## 2022-09-18 ENCOUNTER — Encounter: Payer: Self-pay | Admitting: Internal Medicine

## 2022-09-18 ENCOUNTER — Other Ambulatory Visit: Payer: Medicare PPO

## 2022-09-18 ENCOUNTER — Other Ambulatory Visit: Payer: Self-pay

## 2022-09-18 ENCOUNTER — Inpatient Hospital Stay (HOSPITAL_BASED_OUTPATIENT_CLINIC_OR_DEPARTMENT_OTHER): Payer: Medicare PPO | Admitting: Internal Medicine

## 2022-09-18 ENCOUNTER — Inpatient Hospital Stay: Payer: Medicare PPO | Attending: Internal Medicine

## 2022-09-18 DIAGNOSIS — Z923 Personal history of irradiation: Secondary | ICD-10-CM | POA: Diagnosis not present

## 2022-09-18 DIAGNOSIS — Z9221 Personal history of antineoplastic chemotherapy: Secondary | ICD-10-CM | POA: Diagnosis not present

## 2022-09-18 DIAGNOSIS — C8331 Diffuse large B-cell lymphoma, lymph nodes of head, face, and neck: Secondary | ICD-10-CM | POA: Insufficient documentation

## 2022-09-18 DIAGNOSIS — R634 Abnormal weight loss: Secondary | ICD-10-CM | POA: Diagnosis not present

## 2022-09-18 DIAGNOSIS — R112 Nausea with vomiting, unspecified: Secondary | ICD-10-CM | POA: Diagnosis not present

## 2022-09-18 DIAGNOSIS — R682 Dry mouth, unspecified: Secondary | ICD-10-CM | POA: Diagnosis not present

## 2022-09-18 DIAGNOSIS — K760 Fatty (change of) liver, not elsewhere classified: Secondary | ICD-10-CM | POA: Insufficient documentation

## 2022-09-18 DIAGNOSIS — C8590 Non-Hodgkin lymphoma, unspecified, unspecified site: Secondary | ICD-10-CM

## 2022-09-18 DIAGNOSIS — F419 Anxiety disorder, unspecified: Secondary | ICD-10-CM | POA: Diagnosis not present

## 2022-09-18 DIAGNOSIS — Z452 Encounter for adjustment and management of vascular access device: Secondary | ICD-10-CM | POA: Diagnosis not present

## 2022-09-18 LAB — CBC WITH DIFFERENTIAL/PLATELET
Abs Immature Granulocytes: 0.03 10*3/uL (ref 0.00–0.07)
Basophils Absolute: 0.1 10*3/uL (ref 0.0–0.1)
Basophils Relative: 1 %
Eosinophils Absolute: 0.1 10*3/uL (ref 0.0–0.5)
Eosinophils Relative: 1 %
HCT: 39.3 % (ref 36.0–46.0)
Hemoglobin: 13.5 g/dL (ref 12.0–15.0)
Immature Granulocytes: 0 %
Lymphocytes Relative: 5 %
Lymphs Abs: 0.5 10*3/uL — ABNORMAL LOW (ref 0.7–4.0)
MCH: 31.8 pg (ref 26.0–34.0)
MCHC: 34.4 g/dL (ref 30.0–36.0)
MCV: 92.5 fL (ref 80.0–100.0)
Monocytes Absolute: 0.6 10*3/uL (ref 0.1–1.0)
Monocytes Relative: 7 %
Neutro Abs: 7.4 10*3/uL (ref 1.7–7.7)
Neutrophils Relative %: 86 %
Platelets: 223 10*3/uL (ref 150–400)
RBC: 4.25 MIL/uL (ref 3.87–5.11)
RDW: 13.4 % (ref 11.5–15.5)
WBC: 8.6 10*3/uL (ref 4.0–10.5)
nRBC: 0 % (ref 0.0–0.2)

## 2022-09-18 LAB — COMPREHENSIVE METABOLIC PANEL
ALT: 50 U/L — ABNORMAL HIGH (ref 0–44)
AST: 70 U/L — ABNORMAL HIGH (ref 15–41)
Albumin: 4.1 g/dL (ref 3.5–5.0)
Alkaline Phosphatase: 125 U/L (ref 38–126)
Anion gap: 11 (ref 5–15)
BUN: 17 mg/dL (ref 8–23)
CO2: 30 mmol/L (ref 22–32)
Calcium: 9.7 mg/dL (ref 8.9–10.3)
Chloride: 99 mmol/L (ref 98–111)
Creatinine, Ser: 0.71 mg/dL (ref 0.44–1.00)
GFR, Estimated: >60 mL/min (ref >60)
Glucose, Bld: 131 mg/dL — ABNORMAL HIGH (ref 70–99)
Potassium: 4.1 mmol/L (ref 3.5–5.1)
Sodium: 140 mmol/L (ref 135–145)
Total Bilirubin: 0.9 mg/dL (ref 0.3–1.2)
Total Protein: 7.6 g/dL (ref 6.5–8.1)

## 2022-09-18 LAB — LACTATE DEHYDROGENASE: LDH: 142 U/L (ref 98–192)

## 2022-09-18 MED ORDER — ALPRAZOLAM 0.5 MG PO TABS: ORAL_TABLET | ORAL | 0 refills | Status: DC

## 2022-09-18 NOTE — Assessment & Plan Note (Addendum)
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  Clinically stage II. Currently s/p R-CEOP chemotherapy every 3 weeks x 3 cycles-followed by involved field radiation.  Patient currently status post radiation-finished radiation on sep 7th.   #We will plan PET scan in approximately 1 month from now.  MAY 2023- LEFT ventricular ejection fraction of 64% with normal LV wall motion.  PET scan ordered today.  # Anxiety: Causing her nausea vomiting.-Xanax.  Informed patient that this will be her last refill from our side.  She need to follow-up with PCP for further refills.  # Slightly intermittent elevated LFts-?  Fatty liver.  Hepatitis panel normal.- STABLE.   # prophylaxis Shingles: OFF  Acyclovir prophylaxis   #Hypomagnesemia: June-magnesium 1.4; magnesium supplementation July 2 0.0.  # Weight loss: poor taste/ dry mouth-   # IV access: port flush   # DISPOSITION: # port flush next week  # follow up in 1 month- MD;  Cbc/cmp/ldh;   prior- PET scan-- Dr.B

## 2022-09-18 NOTE — Progress Notes (Signed)
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 Biopsy is suggestive of fatty liver changes  # carcinoma of lung status post left upper lobe resection in July of 2014; Adenocarcinoma T1N0 M0 tumor EGFR positive  SURGICAL PATHOLOGY  CASE: [id number]  PATIENT: Julie Jennings  Surgical Pathology Report      Specimen Submitted:  A. Lymph node, left neck   Clinical History: New left cervical lymphadenopathy.  History of  lymphoma and lung cancer.  New lymphadenopathy    MAY 17th, 2023- [Dr.juengle]; A. LYMPH NODE, LEFT NECK; ULTRASOUND-GUIDED BIOPSY:  - FINDINGS COMPATIBLE WITH LARGE B-CELL LYMPHOMA.   Comment:  Core biopsy sections display lymphoid tissue with a somewhat effaced  immunoarchitecture.  Portions of nodal tissue are comprised of sheets of  small lymphocytes, while other areas display dense background fibrosis,  and aggregates of large abnormal lymphocytes with irregular nuclear  contours, open chromatin, visible nucleoli, and retraction artifact.   Immunohistochemical studies demonstrate diffuse positivity for CD20  within regions comprised of larger lymphocytes, compatible with B cells.  CD3 highlights a background of small T cells.  CK AE1/AE3 is negative  for metastatic carcinoma.  PAX5 displays dim, patchy marking of larger  abnormal B cells.  In addition, these B cells appear to display dim  expression of CD10, with  positivity for Bcl-2, BCL6, and Mum-1.  B cells  are negative for CD5.  CD30 displays increased marking.  C-Myc is  positive, staining greater than 40% of larger cells.  CD45 (LCA) is  diffusely positive, without aberrant loss of expression.   Concurrent flow cytometric studies demonstrate a CD10 positive  monoclonal B-cell population in a background of many polytypic B cells,  representing 3% of total viable lymphoid cells and 8% of B cells.  For  further details, see scanned report in [facility].   The patient's history is of diffuse large B-cell lymphoma, as well as  invasive adenocarcinoma of the lung are noted.  Biopsy sections  demonstrate an abnormal proliferation of large B cells, compatible with  involvement by a diffuse large B-cell lymphoma. Further  subclassification is difficult, secondary to limited tissue, however,  presence of C10 marking would suggest a germinal center immunophenotype.  In addition, there does appear to be expression of both Bcl-2 and c-myc,  which may suggest a more aggressive process. There is no evidence of  metastatic adenocarcinoma. FISH testing for prognostically significant  abnormalities of BCL2, BCL6, and MYC will be attempted, and reported as  an addendum.   IMPRESSION: 1. Enlarged hypermetabolic lymph nodes in the bilateral left greater than right neck and asymmetric right palatine tonsil hypermetabolism with associated soft tissue fullness on the CT images, all new since 2014 PET-CT, most compatible with recurrent lymphoma. Deauville category 5.  # MAY 12th, 2023- DLBCL- STAGE II [NO Bone marrow]; GCB; double expresser-mcy; Bcl-2; QNS-FISH.   # MAY 30th, 2023- RCEOP q 3 W x3-RT [finished radiation-sep 7th, 2023]   Cancer of upper lobe of  left lung (HCC)  Diffuse large B-cell lymphoma of lymph nodes of neck ([place])  04/25/2022 Initial Diagnosis   Diffuse large B-cell lymphoma of lymph nodes of neck ([place])   04/26/2022 Cancer Staging   Staging  form: Hodgkin and Non-Hodgkin Lymphoma, AJCC 8th Edition - Clinical: Stage II - Signed by Cammie Sickle, MD on 04/26/2022 Histopathologic type: Malignant lymphoma, large B-cell, diffuse, NOS   05/08/2022 -  Chemotherapy   [name]       INTERVAL HISTORY: Patient is accompanied by her cousin [name].  She is ambulating independently.  Julie Jennings 71 y.o.  female pleasant patient with extreme anxiety; and diffuse large B cell lymphoma [prior history of 2005 DLCBL]  is here for follow-up.  Patient currently status post R-CEOP cycle #3; also followed by radiation approximately a month ago.  Complains of dry mouth.  Poor taste.  No weight loss. Patient states she vomited this morning.  She continues to be anxious.  Patient denies any worsening enlargement of her lymph nodes.  Notes to have improvement of the lymph nodes.   Review of Systems  Constitutional:  Negative for chills, diaphoresis, fever, malaise/fatigue and weight loss.  HENT:  Negative for nosebleeds and sore throat.   Eyes:  Negative for double vision.  Respiratory:  Negative for cough, hemoptysis, sputum production, shortness of breath and wheezing.   Cardiovascular:  Negative for chest pain, palpitations, orthopnea and leg swelling.  Gastrointestinal:  Negative for abdominal pain, blood in stool, constipation, diarrhea, heartburn, melena, nausea and vomiting.  Musculoskeletal:  Positive for joint pain. Negative for back pain.  Skin: Negative.  Negative for itching and rash.  Neurological:  Negative for dizziness, tingling, focal weakness, weakness and headaches.  Endo/Heme/Allergies:  Does not bruise/bleed easily.  Psychiatric/Behavioral:  Negative for depression. The patient is nervous/anxious. The patient does not have insomnia.      PAST MEDICAL HISTORY :  Past Medical History:  Diagnosis Date  . A-fib ([place])    Only once  . Anxiety   .  Arthritis    oesteoarthritis  . BP (high blood pressure) 04/16/2014  . Chronic kidney disease    history nephrolithiasis  . Depression   . Dysrhythmia    PSVT  . Endometriosis   . Herpes zoster   . History of kidney stones   . Lung cancer ([place]) left  . Lymphoma ([place])    "stomach"  . Stroke [facility])     PAST SURGICAL HISTORY :   Past Surgical History:  Procedure Laterality Date  . AUGMENTATION MAMMAPLASTY Bilateral   . CHOLECYSTECTOMY    . COLONOSCOPY    . COLONOSCOPY WITH PROPOFOL N/A 04/14/2018   Procedure: COLONOSCOPY WITH PROPOFOL;  Surgeon: Manya Silvas, MD;  Location: [facility] ENDOSCOPY;  Service: Endoscopy;  Laterality: N/A;  . DILATION AND CURETTAGE OF UTERUS    . HEMORRHOIDECTOMY WITH HEMORRHOID BANDING    . HERNIA REPAIR     umbilical hernia  . IR IMAGING GUIDED PORT INSERTION  05/04/2022  . LUNG REMOVAL, PARTIAL Left   . REVERSE SHOULDER ARTHROPLASTY Left 01/04/2022   Procedure: Left reverse shoulder arthroplasty, biceps tenodesis;  Surgeon: Leim Fabry, MD;  Location: [facility] ORS;  Service: Orthopedics;  Laterality: Left;    FAMILY HISTORY :   Family History  Problem Relation Age of Onset  . Stroke Mother   . Diabetes Mother   . Colon cancer Father   . Prostate cancer  Father   . Breast cancer Neg Hx     SOCIAL HISTORY:   Social History   Tobacco Use  . Smoking status: Never  . Smokeless tobacco: Never  Vaping Use  . Vaping Use: Never used  Substance Use Topics  . Alcohol use: No  . Drug use: No    ALLERGIES:  is allergic to tylenol [acetaminophen], buspirone, and codeine.  MEDICATIONS:  Current Outpatient Medications  Medication Sig Dispense Refill  . aspirin 81 MG chewable tablet Chew by mouth.    . ciprofloxacin (CIPRO) 500 MG tablet Take 1 tablet (500 mg total) by mouth 2 (two) times daily. 6 tablet 0  . fluconazole (DIFLUCAN) 100 MG tablet Take 1 tablet (100 mg total) by mouth daily. 7 tablet 0  . furosemide (LASIX) 40 MG tablet Take 40 mg by  mouth daily.    Marland Kitchen ketoconazole (NIZORAL) 2 % cream Apply to the feet QHS 60 g 3  . lidocaine-prilocaine (EMLA) cream Apply on the port. 30 -45 min  prior to port access. 30 g 3  . losartan (COZAAR) 100 MG tablet Take 100 mg by mouth daily.    . Magnesium Cl-Calcium Carbonate (SLOW MAGNESIUM/CALCIUM) 70-117 MG TBEC Take 1 tablet by mouth 2 (two) times daily. 60 tablet 6  . meloxicam (MOBIC) 15 MG tablet Take 1 tablet (15 mg total) by mouth daily. 30 tablet 0  . nebivolol (BYSTOLIC) 10 MG tablet Take 10 mg by mouth daily.    . ondansetron (ZOFRAN) 4 MG tablet Take 1 tablet (4 mg total) by mouth every 6 (six) hours as needed for nausea. 20 tablet 0  . ondansetron (ZOFRAN) 8 MG tablet Every 8 hours as needed for nausea/vomitting. 40 tablet 2  . pantoprazole (PROTONIX) 40 MG tablet Take 40 mg by mouth 2 (two) times daily.    Vladimir Faster Glycol-Propyl Glycol (SYSTANE) 0.4-0.3 % SOLN Place 1 drop into both eyes daily as needed (Dry eye).    . prochlorperazine (COMPAZINE) 10 MG tablet Take 1 tablet (10 mg total) by mouth every 6 (six) hours as needed for nausea or vomiting. 40 tablet 0  . QUEtiapine (SEROQUEL) 25 MG tablet Take 1 tablet (25 mg total) by mouth at bedtime. 90 tablet 0  . sucralfate (CARAFATE) 1 g tablet Take 1 tablet (1 g total) by mouth 4 (four) times daily -  with meals and at bedtime. Dissolve medication in 3 to 4 tablespoons of warm water. Swish and swallow. 90 tablet 1  . tiotropium (SPIRIVA) 18 MCG inhalation capsule Place 18 mcg into inhaler and inhale daily.    Marland Kitchen triamcinolone (NASACORT) 55 MCG/ACT AERO nasal inhaler Place 2 sprays into the nose daily.    Marland Kitchen venlafaxine XR (EFFEXOR-XR) 75 MG 24 hr capsule Take 3 capsules (225 mg total) by mouth daily with breakfast. 90 capsule 1  . acyclovir (ZOVIRAX) 400 MG tablet Take 1 tablet (400 mg total) by mouth 2 (two) times daily. (Patient not taking: Reported on 09/18/2022) 60 tablet 4  . ALPRAZolam (XANAX) 0.5 MG tablet Take one pill three  times a day as needed 90 tablet 0  . potassium chloride (KLOR-CON M) 10 MEQ tablet Take 1 tablet (10 mEq total) by mouth daily. (Patient not taking: Reported on 09/18/2022) 30 tablet 0  . potassium chloride (KLOR-CON) 10 MEQ tablet Take 10 mEq by mouth daily. (Patient not taking: Reported on 09/18/2022)     No current facility-administered medications for this visit.    PHYSICAL EXAMINATION: ECOG PERFORMANCE  STATUS: 0 - Asymptomatic  BP 111/89   Pulse 95   Temp (!) 96.6 F (35.9 C)   Resp 20   Wt 183 lb 6.4 oz (83.2 kg)   SpO2 100%   BMI 29.60 kg/m   Filed Weights   09/18/22 1333  Weight: 183 lb 6.4 oz (83.2 kg)    Left neck lymphadenopathy 2 to 3 cm in size noted.  Nontender.  Physical Exam HENT:     Head: Normocephalic and atraumatic.     Mouth/Throat:     Pharynx: No oropharyngeal exudate.  Eyes:     Pupils: Pupils are equal, round, and reactive to light.  Cardiovascular:     Rate and Rhythm: Normal rate and regular rhythm.  Pulmonary:     Effort: Pulmonary effort is normal. No respiratory distress.     Breath sounds: Normal breath sounds. No wheezing.  Abdominal:     General: Bowel sounds are normal. There is no distension.     Palpations: Abdomen is soft. There is no mass.     Tenderness: There is no abdominal tenderness. There is no guarding or rebound.  Musculoskeletal:        General: No tenderness. Normal range of motion.     Cervical back: Normal range of motion and neck supple.  Skin:    General: Skin is warm.  Neurological:     Mental Status: She is alert and oriented to person, place, and time.  Psychiatric:        Mood and Affect: Affect normal.     LABORATORY DATA:  I have reviewed the data as listed    Component Value Date/Time   NA 140 09/18/2022 1315   NA 139 03/14/2015 0856   K 4.1 09/18/2022 1315   K 4.1 03/14/2015 0856   CL 99 09/18/2022 1315   CL 103 03/14/2015 0856   CO2 30 09/18/2022 1315   CO2 28 03/14/2015 0856   GLUCOSE 131  (H) 09/18/2022 1315   GLUCOSE 111 (H) 03/14/2015 0856   BUN 17 09/18/2022 1315   BUN 19 03/14/2015 0856   CREATININE 0.71 09/18/2022 1315   CREATININE 0.56 03/14/2015 0856   CALCIUM 9.7 09/18/2022 1315   CALCIUM 9.3 03/14/2015 0856   PROT 7.6 09/18/2022 1315   PROT 7.7 03/14/2015 0856   ALBUMIN 4.1 09/18/2022 1315   ALBUMIN 4.3 03/14/2015 0856   AST 70 (H) 09/18/2022 1315   AST 80 (H) 03/14/2015 0856   ALT 50 (H) 09/18/2022 1315   ALT 87 (H) 03/14/2015 0856   ALKPHOS 125 09/18/2022 1315   ALKPHOS 164 (H) 03/14/2015 0856   BILITOT 0.9 09/18/2022 1315   BILITOT 0.8 03/14/2015 0856   GFRNONAA >60 09/18/2022 1315   GFRNONAA >60 03/14/2015 0856   GFRAA >60 07/07/2020 1041   GFRAA >60 03/14/2015 0856    No results found for: "SPEP", "UPEP"  Lab Results  Component Value Date   WBC 8.6 09/18/2022   NEUTROABS 7.4 09/18/2022   HGB 13.5 09/18/2022   HCT 39.3 09/18/2022   MCV 92.5 09/18/2022   PLT 223 09/18/2022      Chemistry      Component Value Date/Time   NA 140 09/18/2022 1315   NA 139 03/14/2015 0856   K 4.1 09/18/2022 1315   K 4.1 03/14/2015 0856   CL 99 09/18/2022 1315   CL 103 03/14/2015 0856   CO2 30 09/18/2022 1315   CO2 28 03/14/2015 0856   BUN 17 09/18/2022 1315  BUN 19 03/14/2015 0856   CREATININE 0.71 09/18/2022 1315   CREATININE 0.56 03/14/2015 0856      Component Value Date/Time   CALCIUM 9.7 09/18/2022 1315   CALCIUM 9.3 03/14/2015 0856   ALKPHOS 125 09/18/2022 1315   ALKPHOS 164 (H) 03/14/2015 0856   AST 70 (H) 09/18/2022 1315   AST 80 (H) 03/14/2015 0856   ALT 50 (H) 09/18/2022 1315   ALT 87 (H) 03/14/2015 0856   BILITOT 0.9 09/18/2022 1315   BILITOT 0.8 03/14/2015 0856       RADIOGRAPHIC STUDIES: I have personally reviewed the radiological images as listed and agreed with the findings in the report. No results found.   ASSESSMENT & PLAN:  Diffuse large B-cell lymphoma of lymph nodes of neck (HCC) #Diffuse B-cell lymphoma-bilateral  neck left more than right PET scan.  Clinically stage II. Currently s/p R-CEOP chemotherapy every 3 weeks x 3 cycles-followed by involved field radiation.  Patient currently status post radiation-finished radiation on sep 7th.   #We will plan PET scan in approximately 1 month from now.  MAY 2023- LEFT ventricular ejection fraction of 64% with normal LV wall motion.  PET scan ordered today.  # Anxiety: Causing her nausea vomiting.-Xanax.  Informed patient that this will be her last refill from our side.  She need to follow-up with PCP for further refills.  # Slightly intermittent elevated LFts-?  Fatty liver.  Hepatitis panel normal.- STABLE.   # prophylaxis Shingles: OFF  Acyclovir prophylaxis   #Hypomagnesemia: June-magnesium 1.4; magnesium supplementation July 2 0.0.  # Weight loss: poor taste/ dry mouth-   # IV access: port flush   # DISPOSITION: # port flush next week  # follow up in 1 month- MD;  Cbc/cmp/ldh;   prior- PET scan-- Dr.B     Orders Placed This Encounter  Procedures  . NM PET Image Restage (PS) Skull Base to Thigh (F-18 FDG)    Standing Status:   Future    Standing Expiration Date:   09/19/2023    Order Specific Question:   If indicated for the ordered procedure, I authorize the administration of a radiopharmaceutical per Radiology protocol    Answer:   Yes    Order Specific Question:   Preferred imaging location?    Answer:   [facility]  . CBC with Differential/Platelet    Standing Status:   Future    Standing Expiration Date:   09/18/2023  . Comprehensive metabolic panel    Standing Status:   Future    Standing Expiration Date:   09/18/2023  . Lactate dehydrogenase    Standing Status:   Future    Standing Expiration Date:   09/19/2023   All questions were answered. The patient knows to call the clinic with any problems, questions or concerns.      Cammie Sickle, MD 09/18/2022 3:41 PM

## 2022-09-18 NOTE — Progress Notes (Signed)
Patient states she vomited this morning.  Patient is asking about a refill for Xanax 0.5mg .

## 2022-09-26 ENCOUNTER — Inpatient Hospital Stay: Payer: Medicare PPO

## 2022-09-26 ENCOUNTER — Encounter: Payer: Self-pay | Admitting: Radiation Oncology

## 2022-09-26 ENCOUNTER — Ambulatory Visit
Admission: RE | Admit: 2022-09-26 | Discharge: 2022-09-26 | Disposition: A | Discharge status: Home / Self Care | Payer: Medicare PPO | Source: Ambulatory Visit | Attending: Radiation Oncology | Admitting: Radiation Oncology

## 2022-09-26 VITALS — BP 110/79

## 2022-09-26 VITALS — BP 97/75 | HR 89 | Resp 19 | Ht 67.0 in | Wt 182.0 lb

## 2022-09-26 DIAGNOSIS — C8331 Diffuse large B-cell lymphoma, lymph nodes of head, face, and neck: Secondary | ICD-10-CM | POA: Diagnosis not present

## 2022-09-26 DIAGNOSIS — Z95828 Presence of other vascular implants and grafts: Secondary | ICD-10-CM

## 2022-09-26 DIAGNOSIS — I480 Paroxysmal atrial fibrillation: Secondary | ICD-10-CM | POA: Insufficient documentation

## 2022-09-26 MED ORDER — SODIUM CHLORIDE 0.9% FLUSH
10.0000 mL | Freq: Once | INTRAVENOUS | Status: AC
  Administered 2022-09-26: 10 mL via INTRAVENOUS
  Filled 2022-09-26: qty 10

## 2022-09-26 MED ORDER — HEPARIN SOD (PORK) LOCK FLUSH 100 UNIT/ML IV SOLN
500.0000 [IU] | Freq: Once | INTRAVENOUS | Status: AC
  Administered 2022-09-26: 500 [IU] via INTRAVENOUS
  Filled 2022-09-26: qty 5

## 2022-09-26 NOTE — Progress Notes (Signed)
Radiation Oncology Follow up Note  Name: Julie Jennings   Date:   09/26/2022 MRN:  [id number] DOB: [birth year]    This 71 y.o. female presents to the clinic today for 1 month follow-up status post involved field radiation therapy to her bilateral head and neck nodes for diffuse large B-cell lymphoma of the neck clinical stage II.  REFERRING PROVIDER: Idelle Crouch, MD  HPI: Patient is a 71 year old female now at 1 month having completed involved field radiation therapy to her head and neck for diffuse B-cell lymphoma.  Seen today in routine follow-up she states her mouth is somewhat dry over the weekend not sure if this is chronic or just an acute finding.  We did do salivary gland sparing technique of IMRT radiation therapy.  She specifically denies fever chills or night sweats..  She is scheduled for PET scan the end of this month  COMPLICATIONS OF TREATMENT: none  FOLLOW UP COMPLIANCE: keeps appointments   PHYSICAL EXAM:  BP 97/75   Pulse 89   Resp 19   Ht 5\' 7"  (1.702 m)   Wt 182 lb (82.6 kg)   BMI 28.51 kg/m oral cavity appears somewhat dry. No evidence of adenopathy in the head and neck region or supraclavicular area is noted.  Well-developed well-nourished patient in NAD. HEENT reveals PERLA, EOMI, discs not visualized.  Oral cavity is clear. No oral mucosal lesions are identified. Neck is clear without evidence of cervical or supraclavicular adenopathy. Lungs are clear to A&P. Cardiac examination is essentially unremarkable with regular rate and rhythm without murmur rub or thrill. Abdomen is benign with no organomegaly or masses noted. Motor sensory and DTR levels are equal and symmetric in the upper and lower extremities. Cranial nerves II through XII are grossly intact. Proprioception is intact. No peripheral adenopathy or edema is identified. No motor or sensory levels are noted. Crude visual fields are within normal range.  RADIOLOGY RESULTS: PET scan will be reviewed when  available  PLAN: Present time I will review PET scan when it becomes available.  Have otherwise asked to see her back in 3 to 4 months and then will turn follow-up care over to medical oncology.  Patient knows to call with any concerns.  I would like to take this opportunity to thank you for allowing me to participate in the care of your patient.Noreene Filbert, MD

## 2022-10-03 NOTE — Progress Notes (Deleted)
[place] [facility] MD/PA/NP OP Progress Note  10/03/2022 4:17 PM Julie Jennings  MRN:  [id number]  Chief Complaint: No chief complaint on file.  HPI:  - She completed radiation on Sept 7th.   xanax  Visit Diagnosis: No diagnosis found.  Past Psychiatric History: Please see initial evaluation for full details. I have reviewed the history. No updates at this time.     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"   Stroke [facility])     Past Surgical History:  Procedure Laterality Date   AUGMENTATION MAMMAPLASTY Bilateral    CHOLECYSTECTOMY     COLONOSCOPY     COLONOSCOPY WITH PROPOFOL N/A 04/14/2018   Procedure: COLONOSCOPY WITH PROPOFOL;  Surgeon: Manya Silvas, MD;  Location: [facility] ENDOSCOPY;  Service: Endoscopy;  Laterality: N/A;   DILATION AND CURETTAGE OF UTERUS     HEMORRHOIDECTOMY WITH HEMORRHOID BANDING     HERNIA REPAIR     umbilical hernia   IR IMAGING GUIDED PORT INSERTION  05/04/2022   LUNG REMOVAL, PARTIAL Left    REVERSE SHOULDER ARTHROPLASTY Left 01/04/2022   Procedure: Left reverse shoulder arthroplasty, biceps tenodesis;  Surgeon: Leim Fabry, MD;  Location: [facility] ORS;  Service: Orthopedics;  Laterality: Left;    Family Psychiatric History: Please see initial evaluation for full details. I have reviewed the history. No updates at this time.     Family History:  Family History  Problem Relation Age of Onset   Stroke Mother    Diabetes Mother    Colon cancer Father    Prostate cancer Father    Breast cancer Neg Hx     Social History:  Social History   Socioeconomic History   Marital status: Widowed    Spouse name: Not on file    Number of children: 0   Years of education: Not on file   Highest education level: Some college, no degree  Occupational History   Not on file  Tobacco Use   Smoking status: Never   Smokeless tobacco: Never  Vaping Use   Vaping Use: Never used  Substance and Sexual Activity   Alcohol use: No   Drug use: No   Sexual activity: Not Currently    Birth control/protection: Post-menopausal  Other Topics Concern   Not on file  Social History Narrative   Not on file   Social Determinants of Health   Financial Resource Strain: Low Risk  (07/05/2022)   Overall Financial Resource Strain (CARDIA)    Difficulty of Paying Living Expenses: Not hard at all  Food Insecurity: No Food Insecurity (07/05/2022)   Hunger Vital Sign    Worried About Running Out of Food in the Last Year: Never true    Ran Out of Food in the Last Year: Never true  Transportation Needs: No Transportation Needs (07/05/2022)   PRAPARE - Hydrologist (Medical): No    Lack of Transportation (Non-Medical): No  Physical Activity: Inactive (07/05/2022)   Exercise Vital Sign    Days of Exercise per Week:  0 days    Minutes of Exercise per Session: 0 min  Stress: Stress Concern Present (07/05/2022)   [place]    Feeling of Stress : Rather much  Social Connections: Socially Isolated (07/05/2022)   Social Connection and Isolation Panel [NHANES]    Frequency of Communication with Friends and Family: More than three times a week    Frequency of Social Gatherings with Friends and Family: More than three times a week    Attends Religious Services: Never    Marine scientist or Organizations: No    Attends Music therapist: Not on file    Marital Status: Widowed    Allergies:  Allergies  Allergen Reactions   Tylenol [Acetaminophen] Other (See Comments)    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 (Patient not taking: Reported on 09/18/2022) 60 tablet 4   ALPRAZolam (XANAX) 0.5 MG tablet Take one pill three times a day as needed 90 tablet 0   aspirin 81 MG chewable tablet Chew by mouth.     ciprofloxacin (CIPRO) 500 MG tablet Take 1 tablet (500 mg total) by mouth 2 (two) times daily. 6 tablet 0   fluconazole (DIFLUCAN) 100 MG tablet Take 1 tablet (100 mg total) by mouth daily. 7 tablet 0   furosemide (LASIX) 40 MG tablet Take 40 mg by mouth daily.     ketoconazole (NIZORAL) 2 % cream Apply to the feet QHS 60 g 3   lidocaine-prilocaine (EMLA) cream Apply on the port. 30 -45 min  prior to port access. 30 g 3   losartan (COZAAR) 100 MG tablet Take 100 mg by mouth daily.     Magnesium Cl-Calcium Carbonate (SLOW MAGNESIUM/CALCIUM) 70-117 MG TBEC Take 1 tablet by mouth 2 (two) times daily. 60 tablet 6   meloxicam (MOBIC) 15 MG tablet Take 1 tablet (15 mg total) by mouth daily. 30 tablet 0   nebivolol (BYSTOLIC) 10 MG tablet Take 10 mg by mouth daily.     ondansetron (ZOFRAN) 4 MG tablet Take 1 tablet (4 mg total) by mouth every 6 (six) hours as needed for nausea. 20 tablet 0   ondansetron (ZOFRAN) 8 MG tablet Every 8 hours as needed for nausea/vomitting. 40 tablet 2   pantoprazole (PROTONIX) 40 MG tablet Take 40 mg by mouth 2 (two) times daily.     Polyethyl Glycol-Propyl Glycol (SYSTANE) 0.4-0.3 % SOLN Place 1 drop into both eyes daily as  needed (Dry eye).     potassium chloride (KLOR-CON) 10 MEQ tablet Take 10 mEq by mouth daily. (Patient not taking: Reported on 09/18/2022)     prochlorperazine (COMPAZINE) 10 MG tablet Take 1 tablet (10 mg total) by mouth every 6 (six) hours as needed for nausea or vomiting. 40 tablet 0   QUEtiapine (SEROQUEL) 25 MG tablet Take 1 tablet (25 mg total) by mouth at bedtime. 90 tablet 0  sucralfate (CARAFATE) 1 g tablet Take 1 tablet (1 g total) by mouth 4 (four) times daily -  with meals and at bedtime. Dissolve medication in 3 to 4 tablespoons of warm water. Swish and swallow. 90 tablet 1   tiotropium (SPIRIVA) 18 MCG inhalation capsule Place 18 mcg into inhaler and inhale daily.     triamcinolone (NASACORT) 55 MCG/ACT AERO nasal inhaler Place 2 sprays into the nose daily.     venlafaxine XR (EFFEXOR-XR) 75 MG 24 hr capsule Take 3 capsules (225 mg total) by mouth daily with breakfast. 90 capsule 1   No current facility-administered medications for this visit.     Musculoskeletal: Strength & Muscle Tone: within normal limits Gait & Station: normal Patient leans: N/A  Psychiatric Specialty Exam: Review of Systems  There were no vitals taken for this visit.There is no height or weight on file to calculate BMI.  General Appearance: {Appearance:22683}  Eye Contact:  {BHH EYE CONTACT:22684}  Speech:  Clear and Coherent  Volume:  Normal  Mood:  {BHH MOOD:22306}  Affect:  {Affect (PAA):22687}  Thought Process:  Coherent  Orientation:  Full (Time, Place, and Person)  Thought Content: Logical   Suicidal Thoughts:  {ST/HT (PAA):22692}  Homicidal Thoughts:  {ST/HT (PAA):22692}  Memory:  Immediate;   {BHH GOOD/FAIR/POOR:22877}  Judgement:  {Judgement (PAA):22694}  Insight:  {Insight (PAA):22695}  Psychomotor Activity:  Normal  Concentration:  Concentration: Good and Attention Span: Good  Recall:  Good  Fund of Knowledge: Good  Language: Good  Akathisia:  No  Handed:  Right  AIMS (if  indicated): not done  Assets:  Communication Skills Desire for Improvement  ADL's:  Intact  Cognition: WNL  Sleep:  {BHH GOOD/FAIR/POOR:22877}   Screenings: GAD-7    Flowsheet Row Office Visit from 07/03/2022 in [place]  Total GAD-7 Score 19      PHQ2-9    [place] from 07/05/2022 in [place] [facility] [place] Visit from 07/03/2022 in [place]  PHQ-2 Total Score 4 6  PHQ-9 Total Score 13 22      Flowsheet Row Admission (Discharged) from 01/04/2022 in [place] (1A) Pre-Admission Testing 60 from 01/03/2022 in [place] ED from 12/25/2021 in [place] No Risk No Risk No Risk        Assessment and Plan:  Julie Jennings is a 71 y.o. 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    1. Anxiety 2. MDD (major depressive disorder), recurrent episode, moderate ([place]) She reports significant worsening in anxiety and depressive symptoms in the context of undergoing chemotherapy/experiencing physical symptoms from the treatment for the past few months.  Other psychosocial stressors includes loss of her husband a few years ago, and her parents.  Will add quetiapine to target depression, anxiety, insomnia and an appetite loss.  Discussed potential metabolic side effect and EPS.  Will continue venlafaxine at the current dose given it has been recently uptitrated.  Noted that although she may benefit from lorazepam, she reportedly had side effect from this medication. Will continue current dose of Xanax at this time for anxiety.    Plan Continue  venlafaxine 225 mg daily  Start quetiapine 25  mg at night  Continue Xanax 0.5 mg 3 times a day as needed for anxiety Next appointment: 8/21 at 1 PM for 30 mins, in person   The patient demonstrates the following risk factors for suicide: Chronic risk factors for suicide include: psychiatric disorder of depression, anxiety . Acute risk factors for suicide include: loss (financial, interpersonal, professional). Protective factors for this patient include: positive social support, coping skills, hope for the future, and religious beliefs against suicide. Considering these factors, the overall suicide risk at this point appears to be low. Patient is appropriate for outpatient follow up.      Collaboration of Care: Collaboration of Care: {BH OP Collaboration of Care:21014065}  Patient/Guardian was advised Release of Information must be obtained prior to any record release in order to collaborate their care with an outside provider. Patient/Guardian was advised if they have not already done so to contact the registration department to sign all necessary forms in order for [place] to release information regarding their care.   Consent: Patient/Guardian gives verbal consent for treatment and assignment of benefits for services provided during this visit. Patient/Guardian expressed understanding and agreed to proceed.    Norman Clay, MD 10/03/2022, 4:17 PM

## 2022-10-08 ENCOUNTER — Ambulatory Visit: Payer: Medicare PPO | Admitting: Psychiatry

## 2022-10-09 ENCOUNTER — Encounter
Admission: RE | Admit: 2022-10-09 | Discharge: 2022-10-09 | Disposition: A | Discharge status: Home / Self Care | Payer: Medicare PPO | Source: Ambulatory Visit | Attending: Internal Medicine | Admitting: Internal Medicine

## 2022-10-09 DIAGNOSIS — C8331 Diffuse large B-cell lymphoma, lymph nodes of head, face, and neck: Secondary | ICD-10-CM | POA: Insufficient documentation

## 2022-10-09 LAB — GLUCOSE, CAPILLARY: Glucose-Capillary: 137 mg/dL — ABNORMAL HIGH (ref 70–99)

## 2022-10-09 MED ORDER — FLUDEOXYGLUCOSE F - 18 (FDG) INJECTION
9.4000 | Freq: Once | INTRAVENOUS | Status: AC | PRN
  Administered 2022-10-09: 10.27 via INTRAVENOUS

## 2022-10-14 ENCOUNTER — Other Ambulatory Visit: Payer: Self-pay | Admitting: Oncology

## 2022-10-14 MED ORDER — PROCHLORPERAZINE MALEATE 10 MG PO TABS: 10.0000 mg | ORAL_TABLET | Freq: Four times a day (QID) | ORAL | 0 refills | Status: DC | PRN

## 2022-10-14 MED ORDER — ONDANSETRON HCL 4 MG PO TABS: 4.0000 mg | ORAL_TABLET | Freq: Four times a day (QID) | ORAL | 0 refills | Status: DC | PRN

## 2022-10-15 ENCOUNTER — Other Ambulatory Visit: Payer: Self-pay | Admitting: Internal Medicine

## 2022-10-16 ENCOUNTER — Inpatient Hospital Stay: Payer: Medicare PPO

## 2022-10-16 ENCOUNTER — Encounter: Payer: Self-pay | Admitting: Medical Oncology

## 2022-10-16 ENCOUNTER — Inpatient Hospital Stay: Payer: Medicare PPO | Attending: Internal Medicine | Admitting: Medical Oncology

## 2022-10-16 VITALS — BP 92/71 | HR 87 | Temp 96.7°F | Wt 183.0 lb

## 2022-10-16 DIAGNOSIS — N3090 Cystitis, unspecified without hematuria: Secondary | ICD-10-CM | POA: Insufficient documentation

## 2022-10-16 DIAGNOSIS — R7989 Other specified abnormal findings of blood chemistry: Secondary | ICD-10-CM | POA: Diagnosis not present

## 2022-10-16 DIAGNOSIS — K7460 Unspecified cirrhosis of liver: Secondary | ICD-10-CM | POA: Diagnosis not present

## 2022-10-16 DIAGNOSIS — Z923 Personal history of irradiation: Secondary | ICD-10-CM | POA: Insufficient documentation

## 2022-10-16 DIAGNOSIS — C801 Malignant (primary) neoplasm, unspecified: Secondary | ICD-10-CM

## 2022-10-16 DIAGNOSIS — C8331 Diffuse large B-cell lymphoma, lymph nodes of head, face, and neck: Secondary | ICD-10-CM

## 2022-10-16 DIAGNOSIS — F411 Generalized anxiety disorder: Secondary | ICD-10-CM

## 2022-10-16 DIAGNOSIS — Z95828 Presence of other vascular implants and grafts: Secondary | ICD-10-CM

## 2022-10-16 DIAGNOSIS — F419 Anxiety disorder, unspecified: Secondary | ICD-10-CM | POA: Diagnosis not present

## 2022-10-16 LAB — CBC WITH DIFFERENTIAL/PLATELET
Abs Immature Granulocytes: 0.02 10*3/uL (ref 0.00–0.07)
Basophils Absolute: 0 10*3/uL (ref 0.0–0.1)
Basophils Relative: 1 %
Eosinophils Absolute: 0.1 10*3/uL (ref 0.0–0.5)
Eosinophils Relative: 2 %
HCT: 39.6 % (ref 36.0–46.0)
Hemoglobin: 13.5 g/dL (ref 12.0–15.0)
Immature Granulocytes: 0 %
Lymphocytes Relative: 7 %
Lymphs Abs: 0.4 10*3/uL — ABNORMAL LOW (ref 0.7–4.0)
MCH: 32 pg (ref 26.0–34.0)
MCHC: 34.1 g/dL (ref 30.0–36.0)
MCV: 93.8 fL (ref 80.0–100.0)
Monocytes Absolute: 0.4 10*3/uL (ref 0.1–1.0)
Monocytes Relative: 6 %
Neutro Abs: 5.2 10*3/uL (ref 1.7–7.7)
Neutrophils Relative %: 84 %
Platelets: 218 10*3/uL (ref 150–400)
RBC: 4.22 MIL/uL (ref 3.87–5.11)
RDW: 13.7 % (ref 11.5–15.5)
WBC: 6.2 10*3/uL (ref 4.0–10.5)
nRBC: 0 % (ref 0.0–0.2)

## 2022-10-16 LAB — COMPREHENSIVE METABOLIC PANEL
ALT: 38 U/L (ref 0–44)
AST: 52 U/L — ABNORMAL HIGH (ref 15–41)
Albumin: 4.2 g/dL (ref 3.5–5.0)
Alkaline Phosphatase: 132 U/L — ABNORMAL HIGH (ref 38–126)
Anion gap: 13 (ref 5–15)
BUN: 16 mg/dL (ref 8–23)
CO2: 27 mmol/L (ref 22–32)
Calcium: 9.5 mg/dL (ref 8.9–10.3)
Chloride: 98 mmol/L (ref 98–111)
Creatinine, Ser: 0.87 mg/dL (ref 0.44–1.00)
GFR, Estimated: >60 mL/min (ref >60)
Glucose, Bld: 147 mg/dL — ABNORMAL HIGH (ref 70–99)
Potassium: 3.6 mmol/L (ref 3.5–5.1)
Sodium: 138 mmol/L (ref 135–145)
Total Bilirubin: 1 mg/dL (ref 0.3–1.2)
Total Protein: 7.8 g/dL (ref 6.5–8.1)

## 2022-10-16 LAB — LACTATE DEHYDROGENASE: LDH: 126 U/L (ref 98–192)

## 2022-10-16 NOTE — Progress Notes (Signed)
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 Biopsy is suggestive of fatty liver changes  # carcinoma of lung status post left upper lobe resection in July of 2014; Adenocarcinoma T1N0 M0 tumor EGFR positive  SURGICAL PATHOLOGY  CASE: [id number]  PATIENT: Julie Jennings  Surgical Pathology Report      Specimen Submitted:  A. Lymph node, left neck   Clinical History: New left cervical lymphadenopathy.  History of  lymphoma and lung cancer.  New lymphadenopathy    MAY 17th, 2023- [Dr.juengle]; A. LYMPH NODE, LEFT NECK; ULTRASOUND-GUIDED BIOPSY:  - FINDINGS COMPATIBLE WITH LARGE B-CELL LYMPHOMA.   Comment:  Core biopsy sections display lymphoid tissue with a somewhat effaced  immunoarchitecture.  Portions of nodal tissue are comprised of sheets of  small lymphocytes, while other areas display dense background fibrosis,  and aggregates of large abnormal lymphocytes with irregular nuclear  contours, open chromatin, visible nucleoli, and retraction artifact.   Immunohistochemical studies demonstrate diffuse positivity for CD20  within regions comprised of larger lymphocytes, compatible with B cells.  CD3 highlights a background of small T cells.  CK AE1/AE3 is negative  for metastatic carcinoma.  PAX5 displays dim, patchy marking of larger  abnormal B cells.  In addition, these B cells appear to display dim  expression of CD10, with  positivity for Bcl-2, BCL6, and Mum-1.  B cells  are negative for CD5.  CD30 displays increased marking.  C-Myc is  positive, staining greater than 40% of larger cells.  CD45 (LCA) is  diffusely positive, without aberrant loss of expression.   Concurrent flow cytometric studies demonstrate a CD10 positive  monoclonal B-cell population in a background of many polytypic B cells,  representing 3% of total viable lymphoid cells and 8% of B cells.  For  further details, see scanned report in [facility].   The patient's history is of diffuse large B-cell lymphoma, as well as  invasive adenocarcinoma of the lung are noted.  Biopsy sections  demonstrate an abnormal proliferation of large B cells, compatible with  involvement by a diffuse large B-cell lymphoma. Further  subclassification is difficult, secondary to limited tissue, however,  presence of C10 marking would suggest a germinal center immunophenotype.  In addition, there does appear to be expression of both Bcl-2 and c-myc,  which may suggest a more aggressive process. There is no evidence of  metastatic adenocarcinoma. FISH testing for prognostically significant  abnormalities of BCL2, BCL6, and MYC will be attempted, and reported as  an addendum.   IMPRESSION: 1. Enlarged hypermetabolic lymph nodes in the bilateral left greater than right neck and asymmetric right palatine tonsil hypermetabolism with associated soft tissue fullness on the CT images, all new since 2014 PET-CT, most compatible with recurrent lymphoma. Deauville category 5.  # MAY 12th, 2023- DLBCL- STAGE II [NO Bone marrow]; GCB; double expresser-mcy; Bcl-2; QNS-FISH.   # MAY 30th, 2023- RCEOP q 3 W x3-RT [finished radiation-sep 7th, 2023]   Cancer of upper lobe of  left lung (HCC)  Diffuse large B-cell lymphoma of lymph nodes of neck ([place])  04/25/2022 Initial Diagnosis   Diffuse large B-cell lymphoma of lymph nodes of neck ([place])   04/26/2022 Cancer Staging   Staging  form: Hodgkin and Non-Hodgkin Lymphoma, AJCC 8th Edition - Clinical: Stage II - Signed by Cammie Sickle, MD on 04/26/2022 Histopathologic type: Malignant lymphoma, large B-cell, diffuse, NOS   05/08/2022 -  Chemotherapy   [name]       INTERVAL HISTORY: Patient is accompanied by her cousin [name].  She is ambulating independently.  Julie Jennings 71 y.o.  female pleasant patient with extreme anxiety; and diffuse large B cell lymphoma [prior history of 2005 DLCBL]  is here for follow-up.  Patient currently status post R-CEOP 3 cycles and radiation. Doing well off of treatment. Starting to eat more and feeling as if she has more energy. Was having dry mouth but stopped her anti nausea meds as they suspect this to be the cause and feels much better.   Patient denies any worsening enlargement of her lymph nodes. No night sweats, unintentional weight loss, fevers.   Anxiety is stable.   Wt Readings from Last 3 Encounters:  10/16/22 183 lb (83 kg)  09/26/22 182 lb (82.6 kg)  09/18/22 183 lb 6.4 oz (83.2 kg)    Review of Systems  Constitutional:  Negative for chills, diaphoresis, fever, malaise/fatigue and weight loss.  HENT:  Negative for nosebleeds and sore throat.   Eyes:  Negative for double vision.  Respiratory:  Negative for cough, hemoptysis, sputum production, shortness of breath and wheezing.   Cardiovascular:  Negative for chest pain, palpitations, orthopnea and leg swelling.  Gastrointestinal:  Negative for abdominal pain, blood in stool, constipation, diarrhea, heartburn, melena, nausea and vomiting.  Musculoskeletal:  Positive for joint pain. Negative for back pain.  Skin: Negative.  Negative for itching and rash.  Neurological:  Negative for dizziness, tingling, focal weakness, weakness and headaches.  Endo/Heme/Allergies:  Does not bruise/bleed easily.  Psychiatric/Behavioral:  Negative for depression.  The patient is nervous/anxious. The patient does not have insomnia.      PAST MEDICAL HISTORY :  Past Medical History:  Diagnosis Date   A-fib ([place])    Only once   Anxiety    Arthritis    oesteoarthritis   BP (high blood pressure) 04/16/2014   Chronic kidney disease    history nephrolithiasis   Depression    Dysrhythmia    PSVT   Endometriosis    Herpes zoster    History of kidney stones    Lung cancer ([place]) left   Lymphoma ([place])    "stomach"   Stroke ([place])     PAST SURGICAL HISTORY :   Past Surgical History:  Procedure Laterality Date   AUGMENTATION MAMMAPLASTY Bilateral    CHOLECYSTECTOMY     COLONOSCOPY     COLONOSCOPY WITH PROPOFOL N/A 04/14/2018   Procedure: COLONOSCOPY WITH PROPOFOL;  Surgeon: Manya Silvas, MD;  Location: [facility] ENDOSCOPY;  Service: Endoscopy;  Laterality: N/A;   DILATION AND CURETTAGE OF UTERUS     HEMORRHOIDECTOMY WITH HEMORRHOID BANDING     HERNIA REPAIR     umbilical hernia   IR IMAGING GUIDED PORT INSERTION  05/04/2022   LUNG REMOVAL, PARTIAL Left    REVERSE SHOULDER ARTHROPLASTY Left 01/04/2022   Procedure: Left reverse shoulder arthroplasty, biceps tenodesis;  Surgeon: Leim Fabry, MD;  Location: [facility] ORS;  Service: Orthopedics;  Laterality: Left;    FAMILY HISTORY :   Family History  Problem Relation Age of Onset   Stroke Mother    Diabetes Mother    Colon cancer Father    Prostate cancer Father    Breast cancer Neg Hx     SOCIAL HISTORY:   Social History   Tobacco Use   Smoking status: Never   Smokeless tobacco: Never  Vaping Use   Vaping Use: Never used  Substance Use Topics   Alcohol use: No   Drug use: No    ALLERGIES:  is allergic to tylenol [acetaminophen], buspirone, and codeine.  MEDICATIONS:  Current Outpatient Medications  Medication Sig Dispense Refill   ALPRAZolam (XANAX) 0.5 MG tablet Take one pill three times a day as needed 90 tablet 0   aspirin 81 MG chewable tablet Chew by mouth.      ciprofloxacin (CIPRO) 500 MG tablet Take 1 tablet (500 mg total) by mouth 2 (two) times daily. 6 tablet 0   fluconazole (DIFLUCAN) 100 MG tablet Take 1 tablet (100 mg total) by mouth daily. 7 tablet 0   ketoconazole (NIZORAL) 2 % cream Apply to the feet QHS 60 g 3   lidocaine-prilocaine (EMLA) cream Apply on the port. 30 -45 min  prior to port access. 30 g 3   losartan (COZAAR) 100 MG tablet Take 100 mg by mouth daily.     Magnesium Cl-Calcium Carbonate (SLOW MAGNESIUM/CALCIUM) 70-117 MG TBEC Take 1 tablet by mouth 2 (two) times daily. 60 tablet 6   meloxicam (MOBIC) 15 MG tablet Take 1 tablet (15 mg total) by mouth daily. 30 tablet 0   nebivolol (BYSTOLIC) 10 MG tablet Take 10 mg by mouth daily.     ondansetron (ZOFRAN) 4 MG tablet Take 1 tablet (4 mg total) by mouth every 6 (six) hours as needed for nausea. 20 tablet 0   ondansetron (ZOFRAN) 8 MG tablet Every 8 hours as needed for nausea/vomitting. 40 tablet 2   Polyethyl Glycol-Propyl Glycol (SYSTANE) 0.4-0.3 % SOLN Place 1 drop into both eyes daily as needed (Dry eye).     prochlorperazine (COMPAZINE) 10 MG tablet Take 1 tablet (10 mg total) by mouth every 6 (six) hours as needed for nausea or vomiting. 40 tablet 0   QUEtiapine (SEROQUEL) 25 MG tablet Take 1 tablet (25 mg total) by mouth at bedtime. 90 tablet 0   sucralfate (CARAFATE) 1 g tablet Take 1 tablet (1 g total) by mouth 4 (four) times daily -  with meals and at bedtime. Dissolve medication in 3 to 4 tablespoons of warm water. Swish and swallow. 90 tablet 1   tiotropium (SPIRIVA) 18 MCG inhalation capsule Place 18 mcg into inhaler and inhale daily.     triamcinolone (NASACORT) 55 MCG/ACT AERO nasal inhaler Place 2 sprays into the nose daily.     venlafaxine XR (EFFEXOR-XR) 75 MG 24 hr capsule Take 3 capsules (225 mg total) by mouth daily with breakfast. 90 capsule 1   acyclovir (ZOVIRAX) 400 MG tablet Take 1 tablet (400 mg total) by mouth 2 (two) times daily. (Patient not taking:  Reported on 09/18/2022) 60 tablet 4   furosemide (LASIX) 40 MG tablet Take 40 mg by mouth daily.     pantoprazole (PROTONIX) 40 MG tablet Take 40 mg by mouth 2 (two) times daily.     potassium chloride (KLOR-CON) 10 MEQ tablet Take 10 mEq by mouth daily. (Patient not taking: Reported on 09/18/2022)  No current facility-administered medications for this visit.    PHYSICAL EXAMINATION: ECOG PERFORMANCE STATUS: 0 - Asymptomatic  BP 92/71 (BP Location: Right Arm, Patient Position: Sitting)   Pulse 87   Temp (!) 96.7 F (35.9 C) (Tympanic)   Wt 183 lb (83 kg)   BMI 28.66 kg/m   Filed Weights   10/16/22 1053  Weight: 183 lb (83 kg)    Left neck lymphadenopathy 2 to 3 cm in size noted.  Nontender.  Physical Exam HENT:     Head: Normocephalic and atraumatic.     Mouth/Throat:     Pharynx: No oropharyngeal exudate.  Eyes:     Pupils: Pupils are equal, round, and reactive to light.  Cardiovascular:     Rate and Rhythm: Normal rate and regular rhythm.  Pulmonary:     Effort: Pulmonary effort is normal. No respiratory distress.     Breath sounds: Normal breath sounds. No wheezing.  Abdominal:     General: Bowel sounds are normal. There is no distension.     Palpations: Abdomen is soft. There is no mass.     Tenderness: There is no abdominal tenderness. There is no guarding or rebound.  Musculoskeletal:        General: No tenderness. Normal range of motion.     Cervical back: Normal range of motion and neck supple.  Skin:    General: Skin is warm.  Neurological:     Mental Status: She is alert and oriented to person, place, and time.  Psychiatric:        Mood and Affect: Affect normal.      LABORATORY DATA:  I have reviewed the data as listed    Component Value Date/Time   NA 138 10/16/2022 1025   NA 139 03/14/2015 0856   K 3.6 10/16/2022 1025   K 4.1 03/14/2015 0856   CL 98 10/16/2022 1025   CL 103 03/14/2015 0856   CO2 27 10/16/2022 1025   CO2 28 03/14/2015  0856   GLUCOSE 147 (H) 10/16/2022 1025   GLUCOSE 111 (H) 03/14/2015 0856   BUN 16 10/16/2022 1025   BUN 19 03/14/2015 0856   CREATININE 0.87 10/16/2022 1025   CREATININE 0.56 03/14/2015 0856   CALCIUM 9.5 10/16/2022 1025   CALCIUM 9.3 03/14/2015 0856   PROT 7.8 10/16/2022 1025   PROT 7.7 03/14/2015 0856   ALBUMIN 4.2 10/16/2022 1025   ALBUMIN 4.3 03/14/2015 0856   AST 52 (H) 10/16/2022 1025   AST 80 (H) 03/14/2015 0856   ALT 38 10/16/2022 1025   ALT 87 (H) 03/14/2015 0856   ALKPHOS 132 (H) 10/16/2022 1025   ALKPHOS 164 (H) 03/14/2015 0856   BILITOT 1.0 10/16/2022 1025   BILITOT 0.8 03/14/2015 0856   GFRNONAA >60 10/16/2022 1025   GFRNONAA >60 03/14/2015 0856   GFRAA >60 07/07/2020 1041   GFRAA >60 03/14/2015 0856    No results found for: "SPEP", "UPEP"  Lab Results  Component Value Date   WBC 6.2 10/16/2022   NEUTROABS 5.2 10/16/2022   HGB 13.5 10/16/2022   HCT 39.6 10/16/2022   MCV 93.8 10/16/2022   PLT 218 10/16/2022      Chemistry      Component Value Date/Time   NA 138 10/16/2022 1025   NA 139 03/14/2015 0856   K 3.6 10/16/2022 1025   K 4.1 03/14/2015 0856   CL 98 10/16/2022 1025   CL 103 03/14/2015 0856   CO2 27 10/16/2022 1025   CO2  28 03/14/2015 0856   BUN 16 10/16/2022 1025   BUN 19 03/14/2015 0856   CREATININE 0.87 10/16/2022 1025   CREATININE 0.56 03/14/2015 0856      Component Value Date/Time   CALCIUM 9.5 10/16/2022 1025   CALCIUM 9.3 03/14/2015 0856   ALKPHOS 132 (H) 10/16/2022 1025   ALKPHOS 164 (H) 03/14/2015 0856   AST 52 (H) 10/16/2022 1025   AST 80 (H) 03/14/2015 0856   ALT 38 10/16/2022 1025   ALT 87 (H) 03/14/2015 0856   BILITOT 1.0 10/16/2022 1025   BILITOT 0.8 03/14/2015 0856       RADIOGRAPHIC STUDIES: I have personally reviewed the radiological images as listed and agreed with the findings in the report. No results found.   ASSESSMENT & PLAN:  Encounter Diagnoses  Name Primary?   Diffuse large B-cell lymphoma of  lymph nodes of neck (HCC) Yes   Anxiety associated with cancer diagnosis ([place])    Cirrhosis of liver without ascites, unspecified hepatic cirrhosis type ([place])    Port-A-Cath in place     Diffuse large B-cell lymphoma of lymph nodes of neck (HCC) #Diffuse B-cell lymphoma-bilateral neck left more than right PET scan.  Clinically stage II. Currently s/p R-CEOP chemotherapy every 3 weeks x 3 cycles-followed by involved field radiation.  Patient currently status post radiation-finished radiation on sep 7th. Her recent PET scan was overall reassuring. She is feeling well off treatment. Labs stable.    # Anxiety: Improving. Hopeful that being off of the antinausea medications may be beneficial.    # Slightly intermittent elevated LFts- PET shows cirrhosis with stable cyst. LFTs stable. We will continue to monitor.    # IV access: port flush- Due for next port flush on 11/21/2022.    # DISPOSITION: # follow up in 1 month- MD;  Cbc/cmp/ldh    No orders of the defined types were placed in this encounter.  All questions were answered. The patient knows to call the clinic with any problems, questions or concerns.      Hughie Closs, PA-C 10/16/2022 3:01 PM

## 2022-11-06 ENCOUNTER — Other Ambulatory Visit: Payer: Self-pay | Admitting: *Deleted

## 2022-11-06 ENCOUNTER — Inpatient Hospital Stay: Payer: Medicare PPO

## 2022-11-06 ENCOUNTER — Other Ambulatory Visit: Payer: Self-pay

## 2022-11-06 ENCOUNTER — Inpatient Hospital Stay (HOSPITAL_BASED_OUTPATIENT_CLINIC_OR_DEPARTMENT_OTHER): Payer: Medicare PPO | Admitting: Hospice and Palliative Medicine

## 2022-11-06 VITALS — BP 100/80 | HR 78 | Temp 97.6°F | Resp 20 | Ht 67.0 in | Wt 169.0 lb

## 2022-11-06 DIAGNOSIS — C8331 Diffuse large B-cell lymphoma, lymph nodes of head, face, and neck: Secondary | ICD-10-CM | POA: Diagnosis not present

## 2022-11-06 DIAGNOSIS — N3090 Cystitis, unspecified without hematuria: Secondary | ICD-10-CM

## 2022-11-06 DIAGNOSIS — R1084 Generalized abdominal pain: Secondary | ICD-10-CM

## 2022-11-06 LAB — CBC WITH DIFFERENTIAL/PLATELET
Abs Immature Granulocytes: 0.01 10*3/uL (ref 0.00–0.07)
Basophils Absolute: 0.1 10*3/uL (ref 0.0–0.1)
Basophils Relative: 1 %
Eosinophils Absolute: 0.1 10*3/uL (ref 0.0–0.5)
Eosinophils Relative: 1 %
HCT: 38.8 % (ref 36.0–46.0)
Hemoglobin: 13.7 g/dL (ref 12.0–15.0)
Immature Granulocytes: 0 %
Lymphocytes Relative: 8 %
Lymphs Abs: 0.7 10*3/uL (ref 0.7–4.0)
MCH: 32.4 pg (ref 26.0–34.0)
MCHC: 35.3 g/dL (ref 30.0–36.0)
MCV: 91.7 fL (ref 80.0–100.0)
Monocytes Absolute: 0.7 10*3/uL (ref 0.1–1.0)
Monocytes Relative: 8 %
Neutro Abs: 7.6 10*3/uL (ref 1.7–7.7)
Neutrophils Relative %: 82 %
Platelets: 263 10*3/uL (ref 150–400)
RBC: 4.23 MIL/uL (ref 3.87–5.11)
RDW: 13.8 % (ref 11.5–15.5)
WBC: 9.1 10*3/uL (ref 4.0–10.5)
nRBC: 0 % (ref 0.0–0.2)

## 2022-11-06 LAB — URINALYSIS, COMPLETE (UACMP) WITH MICROSCOPIC
Bilirubin Urine: NEGATIVE
Glucose, UA: NEGATIVE mg/dL
Hgb urine dipstick: NEGATIVE
Ketones, ur: NEGATIVE mg/dL
Nitrite: POSITIVE — AB
Protein, ur: NEGATIVE mg/dL
Specific Gravity, Urine: 1.005 (ref 1.005–1.030)
pH: 7 (ref 5.0–8.0)

## 2022-11-06 LAB — COMPREHENSIVE METABOLIC PANEL
ALT: 38 U/L (ref 0–44)
AST: 60 U/L — ABNORMAL HIGH (ref 15–41)
Albumin: 4.2 g/dL (ref 3.5–5.0)
Alkaline Phosphatase: 134 U/L — ABNORMAL HIGH (ref 38–126)
Anion gap: 12 (ref 5–15)
BUN: 11 mg/dL (ref 8–23)
CO2: 29 mmol/L (ref 22–32)
Calcium: 9.4 mg/dL (ref 8.9–10.3)
Chloride: 93 mmol/L — ABNORMAL LOW (ref 98–111)
Creatinine, Ser: 0.71 mg/dL (ref 0.44–1.00)
GFR, Estimated: >60 mL/min (ref >60)
Glucose, Bld: 129 mg/dL — ABNORMAL HIGH (ref 70–99)
Potassium: 3.5 mmol/L (ref 3.5–5.1)
Sodium: 134 mmol/L — ABNORMAL LOW (ref 135–145)
Total Bilirubin: 0.9 mg/dL (ref 0.3–1.2)
Total Protein: 8.2 g/dL — ABNORMAL HIGH (ref 6.5–8.1)

## 2022-11-06 NOTE — Progress Notes (Signed)
Symptom Management [place] at [facility] [phone] [phone]  Patient Care Team: Idelle Crouch, MD as PCP - General (Internal Medicine) Cammie Sickle, MD as Consulting Physician (Oncology)   Name of the patient: Julie Jennings  [id number]  13-Sep-1951   Date of visit: 11/06/22  Reason for Consult: Julie Jennings is a 71 y.o. 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   Patient s/p R-CEOP chemotherapy and RT.    Interval History: Patient presents [facility] today for evaluation of several days of constipation and intermittent right lower quadrant abdominal pain.  Patient states that she is taking MiraLAX but does not found that to be helpful at initiating a bowel movement.  She denies any nausea or vomiting.  No fever or chills.  No abdominal distention.  No GU symptoms.  She has difficult time describing her abdominal discomfort.  She says that she thinks she might be anxious and wonders if that could be causing her symptoms.  She denies any other symptomatic complaints.  PAST MEDICAL HISTORY: Past Medical History:  Diagnosis Date   A-fib ([place])    Only once   Anxiety    Arthritis    oesteoarthritis   BP (high blood pressure) 04/16/2014   Chronic kidney disease    history nephrolithiasis   Depression    Dysrhythmia    PSVT   Endometriosis    Herpes zoster    History of kidney stones    Lung cancer ([place]) left   Lymphoma ([place])    "stomach"   Stroke ([place])     PAST SURGICAL HISTORY:  Past Surgical History:  Procedure Laterality Date   AUGMENTATION MAMMAPLASTY Bilateral    CHOLECYSTECTOMY     COLONOSCOPY     COLONOSCOPY WITH PROPOFOL N/A 04/14/2018   Procedure: COLONOSCOPY WITH PROPOFOL;  Surgeon: Manya Silvas, MD;  Location:  [facility] ENDOSCOPY;  Service: Endoscopy;  Laterality: N/A;   DILATION AND CURETTAGE OF UTERUS     HEMORRHOIDECTOMY WITH HEMORRHOID BANDING     HERNIA REPAIR     umbilical hernia   IR IMAGING GUIDED PORT INSERTION  05/04/2022   LUNG REMOVAL, PARTIAL Left    REVERSE SHOULDER ARTHROPLASTY Left 01/04/2022   Procedure: Left reverse shoulder arthroplasty, biceps tenodesis;  Surgeon: Leim Fabry, MD;  Location: [facility] ORS;  Service: Orthopedics;  Laterality: Left;    HEMATOLOGY/ONCOLOGY HISTORY:  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 Biopsy is suggestive of fatty liver changes  # carcinoma of lung status post left upper lobe resection in July of 2014; Adenocarcinoma T1N0 M0 tumor EGFR positive  SURGICAL PATHOLOGY  CASE: [id number]  PATIENT: Julie Jennings  Surgical Pathology Report      Specimen Submitted:  A. Lymph node, left neck   Clinical History: New left cervical lymphadenopathy.  History of  lymphoma and lung cancer.  New lymphadenopathy    MAY 17th, 2023- [Dr.juengle]; A. LYMPH NODE, LEFT NECK; ULTRASOUND-GUIDED BIOPSY:  - FINDINGS COMPATIBLE WITH LARGE B-CELL LYMPHOMA.   Comment:  Core biopsy sections display lymphoid tissue with a somewhat effaced  immunoarchitecture.  Portions of nodal tissue are comprised of sheets of  small lymphocytes, while other areas display dense background fibrosis,  and aggregates of large abnormal lymphocytes  with irregular nuclear  contours, open chromatin, visible nucleoli, and retraction artifact.   Immunohistochemical studies demonstrate diffuse positivity for CD20  within regions comprised of larger lymphocytes, compatible with B cells.  CD3 highlights a background of small T cells.  CK AE1/AE3 is negative  for metastatic carcinoma.  PAX5 displays dim, patchy marking of larger  abnormal B cells.  In addition, these B cells appear to display dim  expression of  CD10, with positivity for Bcl-2, BCL6, and Mum-1.  B cells  are negative for CD5.  CD30 displays increased marking.  C-Myc is  positive, staining greater than 40% of larger cells.  CD45 (LCA) is  diffusely positive, without aberrant loss of expression.   Concurrent flow cytometric studies demonstrate a CD10 positive  monoclonal B-cell population in a background of many polytypic B cells,  representing 3% of total viable lymphoid cells and 8% of B cells.  For  further details, see scanned report in [facility].   The patient's history is of diffuse large B-cell lymphoma, as well as  invasive adenocarcinoma of the lung are noted.  Biopsy sections  demonstrate an abnormal proliferation of large B cells, compatible with  involvement by a diffuse large B-cell lymphoma. Further  subclassification is difficult, secondary to limited tissue, however,  presence of C10 marking would suggest a germinal center immunophenotype.  In addition, there does appear to be expression of both Bcl-2 and c-myc,  which may suggest a more aggressive process. There is no evidence of  metastatic adenocarcinoma. FISH testing for prognostically significant  abnormalities of BCL2, BCL6, and MYC will be attempted, and reported as  an addendum.   IMPRESSION: 1. Enlarged hypermetabolic lymph nodes in the bilateral left greater than right neck and asymmetric right palatine tonsil hypermetabolism with associated soft tissue fullness on the CT images, all new since 2014 PET-CT, most compatible with recurrent lymphoma. Deauville category 5.  # MAY 12th, 2023- DLBCL- STAGE II [NO Bone marrow]; GCB; double expresser-mcy; Bcl-2; QNS-FISH.   # MAY 30th, 2023- RCEOP q 3 W x3-RT [finished radiation-sep 7th, 2023]   Cancer of upper lobe of left lung (HCC)  Diffuse large B-cell lymphoma of lymph nodes of neck ([place])  04/25/2022 Initial Diagnosis   Diffuse large B-cell lymphoma of lymph nodes of neck ([place])   04/26/2022 Cancer Staging    Staging form: Hodgkin and Non-Hodgkin Lymphoma, AJCC 8th Edition - Clinical: Stage II - Signed by Cammie Sickle, MD on 04/26/2022 Histopathologic type: Malignant lymphoma, large B-cell, diffuse, NOS   05/08/2022 -  Chemotherapy   [name]       ALLERGIES:  is allergic to tylenol [acetaminophen], buspirone, and codeine.  MEDICATIONS:  Current Outpatient Medications  Medication Sig Dispense Refill   ALPRAZolam (XANAX) 0.5 MG tablet Take one pill three times a day as needed 90 tablet 0   aspirin 81 MG chewable tablet Chew by mouth.     furosemide (LASIX) 40 MG tablet Take 40 mg by mouth daily.     losartan (COZAAR) 100 MG tablet Take 100 mg by mouth daily.     nebivolol (BYSTOLIC) 10 MG tablet Take 10 mg by mouth daily.     Polyethyl Glycol-Propyl Glycol (SYSTANE) 0.4-0.3 % SOLN Place 1 drop into both eyes daily as needed (Dry eye).     venlafaxine XR (EFFEXOR-XR) 75 MG 24 hr capsule Take 3 capsules (225 mg total) by mouth daily with breakfast. 90  capsule 1   lidocaine-prilocaine (EMLA) cream Apply on the port. 30 -45 min  prior to port access. (Patient not taking: Reported on 11/06/2022) 30 g 3   Magnesium Cl-Calcium Carbonate (SLOW MAGNESIUM/CALCIUM) 70-117 MG TBEC Take 1 tablet by mouth 2 (two) times daily. (Patient not taking: Reported on 11/06/2022) 60 tablet 6   ondansetron (ZOFRAN) 8 MG tablet Every 8 hours as needed for nausea/vomitting. (Patient not taking: Reported on 11/06/2022) 40 tablet 2   pantoprazole (PROTONIX) 40 MG tablet Take 40 mg by mouth 2 (two) times daily.     prochlorperazine (COMPAZINE) 10 MG tablet Take 1 tablet (10 mg total) by mouth every 6 (six) hours as needed for nausea or vomiting. (Patient not taking: Reported on 11/06/2022) 40 tablet 0   QUEtiapine (SEROQUEL) 25 MG tablet Take 1 tablet (25 mg total) by mouth at bedtime. 90 tablet 0   tiotropium (SPIRIVA) 18 MCG inhalation capsule Place  18 mcg into inhaler and inhale daily. (Patient not taking: Reported on 11/06/2022)     triamcinolone (NASACORT) 55 MCG/ACT AERO nasal inhaler Place 2 sprays into the nose daily. (Patient not taking: Reported on 11/06/2022)     No current facility-administered medications for this visit.    VITAL SIGNS: BP 100/80   Pulse 78   Temp 97.6 F (36.4 C) (Tympanic)   Resp 20   Ht _0  (1.702 m)   Wt 169 lb (76.7 kg)   BMI 26.47 kg/m  Filed Weights   11/06/22 1345  Weight: 169 lb (76.7 kg)    Estimated body mass index is 26.47 kg/m as calculated from the following:   Height as of this encounter: _1  (1.702 m).   Weight as of this encounter: 169 lb (76.7 kg).  LABS: CBC:    Component Value Date/Time   WBC 9.1 11/06/2022 1255   HGB 13.7 11/06/2022 1255   HGB 14.4 03/14/2015 0856   HCT 38.8 11/06/2022 1255   HCT 41.6 03/14/2015 0856   PLT 263 11/06/2022 1255   PLT 189 03/14/2015 0856   MCV 91.7 11/06/2022 1255   MCV 94 03/14/2015 0856   NEUTROABS 7.6 11/06/2022 1255   NEUTROABS 4.7 03/14/2015 0856   LYMPHSABS 0.7 11/06/2022 1255   LYMPHSABS 2.0 03/14/2015 0856   MONOABS 0.7 11/06/2022 1255   MONOABS 0.6 03/14/2015 0856   EOSABS 0.1 11/06/2022 1255   EOSABS 0.3 03/14/2015 0856   BASOSABS 0.1 11/06/2022 1255   BASOSABS 0.1 03/14/2015 0856   Comprehensive Metabolic Panel:    Component Value Date/Time   NA 134 (L) 11/06/2022 1255   NA 139 03/14/2015 0856   K 3.5 11/06/2022 1255   K 4.1 03/14/2015 0856   CL 93 (L) 11/06/2022 1255   CL 103 03/14/2015 0856   CO2 29 11/06/2022 1255   CO2 28 03/14/2015 0856   BUN 11 11/06/2022 1255   BUN 19 03/14/2015 0856   CREATININE 0.71 11/06/2022 1255   CREATININE 0.56 03/14/2015 0856   GLUCOSE 129 (H) 11/06/2022 1255   GLUCOSE 111 (H) 03/14/2015 0856   CALCIUM 9.4 11/06/2022 1255   CALCIUM 9.3 03/14/2015 0856   AST 60 (H) 11/06/2022 1255   AST 80 (H) 03/14/2015 0856   ALT 38 11/06/2022 1255   ALT 87 (H) 03/14/2015 0856    ALKPHOS 134 (H) 11/06/2022 1255   ALKPHOS 164 (H) 03/14/2015 0856   BILITOT 0.9 11/06/2022 1255   BILITOT 0.8 03/14/2015 0856   PROT 8.2 (H) 11/06/2022 1255   PROT 7.7  03/14/2015 0856   ALBUMIN 4.2 11/06/2022 1255   ALBUMIN 4.3 03/14/2015 0856    RADIOGRAPHIC STUDIES: NM PET Image Restage (PS) Skull Base to Thigh (F-18 FDG)  Result Date: 10/11/2022 CLINICAL DATA:  Subsequent treatment strategy for lymphoma. EXAM: NUCLEAR MEDICINE PET SKULL BASE TO THIGH TECHNIQUE: 10.7 mCi F-18 FDG was injected intravenously. Full-ring PET imaging was performed from the skull base to thigh after the radiotracer. CT data was obtained and used for attenuation correction and anatomic localization. Fasting blood glucose: 137 mg/dl COMPARISON:  04/16/2022 FINDINGS: Mediastinal blood pool activity: SUV max 2.62 Liver activity: SUV max 3.68 NECK: Interval resolution of bilateral symmetric activity involving palatine tonsils. On the right the SUV max is equal to 3.47. Previously 9.05. On the left the SUV max is equal to 3.05. Previously 4.77. Previous tracer avid bilateral cervical lymph nodes have resolved in the interval. Deauville criteria 1. There is new mild increased radiotracer uptake within the pharyngeal tonsils. On the left this has an SUV max of 6.31. On the right the SUV max is equal to 4.33. Incidental CT findings: None. CHEST: No hypermetabolic mediastinal or hilar nodes. No suspicious pulmonary nodules on the CT scan. Incidental CT findings: Postoperative changes from prior left upper lobectomy. Stable 4 cm ascending thoracic aortic aneurysm, image 85/2. Aortic atherosclerosis and coronary artery calcifications. ABDOMEN/PELVIS: No abnormal hypermetabolic activity within the liver, pancreas, adrenal glands, or spleen. No hypermetabolic lymph nodes in the abdomen or pelvis. Incidental CT findings: Morphologic features of the liver compatible with cirrhosis. Status post cholecystectomy. Simple cyst within the left  lobe of liver is unchanged measuring 2 cm, image 18/2. Upper pole left kidney cyst is unchanged measuring 2.5 cm, image 124/2. No follow-up imaging recommended. Aortic atherosclerotic calcifications. SKELETON: No focal hypermetabolic activity to suggest skeletal metastasis. Incidental CT findings: None. IMPRESSION: 1. Interval resolution of previously noted tracer avid bilateral cervical lymph nodes. Deauville criteria 1. 2. Interval decrease and bilateral and symmetric increased uptake within the palatine tonsils. 3. New mild increased radiotracer uptake within the pharyngeal tonsils is identified, left greater than right. This is a nonspecific finding and may be reactive in etiology. Attention in these areas on follow-up imaging is advised. 4. No signs of residual or recurrent tumor within the chest, abdomen or pelvis. 5. Morphologic features of the liver compatible with cirrhosis. 6. Stable 4 cm ascending thoracic aortic aneurysm. Recommend follow-up every 12 months and vascular consultation. 7.  Aortic Atherosclerosis (ICD10-I70.0). Electronically Signed   By: Kerby Moors M.D.   On: 10/11/2022 11:17    PERFORMANCE STATUS (ECOG) : 0 - Asymptomatic  Review of Systems Unless otherwise noted, a complete review of systems is negative.  Physical Exam General: NAD Cardiovascular: regular rate and rhythm Pulmonary: clear ant fields Abdomen: soft, nontender, + bowel sounds GU: no suprapubic tenderness Extremities: no edema, no joint deformities Skin: no rashes Neurological: Weakness but otherwise nonfocal  Assessment and Plan:  Constipation-I suspect that her right lower quadrant discomfort is at least in part due to constipation as patient has benign abdominal exam and labs are unchanged from baseline.  Patient also has a chronic history of anxiety and admits to feeling anxious regarding her symptoms today.  Patient had recent PET scan that did not reveal any abdominal disease.  She was eating and  drinking during the clinic visit and seem to be tolerating well.  Recommended that she add daily senna/stimulant laxative in addition to her MiraLAX.  We could obtain imaging if pain persists  or worsens.  RTC in 2 weeks as previously scheduled.  Case and plan discussed with Dr. Rogue Bussing  Patient expressed understanding and was in agreement with this plan. She also understands that She can call clinic at any time with any questions, concerns, or complaints.   Thank you for allowing me to participate in the care of this very pleasant patient.   Time Total: 15 minutes  Visit consisted of counseling and education dealing with the complex and emotionally intense issues of symptom management in the setting of serious illness.Greater than 50%  of this time was spent counseling and coordinating care related to the above assessment and plan.  Signed by: Altha Harm, PhD, NP-C

## 2022-11-06 NOTE — Progress Notes (Signed)
No intervention needed.

## 2022-11-07 ENCOUNTER — Telehealth: Payer: Self-pay | Admitting: *Deleted

## 2022-11-07 MED ORDER — NITROFURANTOIN MONOHYD MACRO 100 MG PO CAPS: 100.0000 mg | ORAL_CAPSULE | Freq: Two times a day (BID) | ORAL | 0 refills | Status: DC

## 2022-11-07 NOTE — Addendum Note (Signed)
Addended by: Altha Harm R on: 11/07/2022 10:42 AM   Modules accepted: Orders

## 2022-11-07 NOTE — Telephone Encounter (Signed)
Pt called because she got a my chart about atb to pharmacy. I told her that there was bacteria in the specimen and soe Josh sent her atb 1 tablet twice a day. And if hte culture comes back with specific type of bacteria and we may need to change the atb then we will call and if she does not hear anything then nothing has changed. Pt agreeable with this

## 2022-11-08 ENCOUNTER — Telehealth: Payer: Self-pay | Admitting: *Deleted

## 2022-11-08 LAB — URINE CULTURE: Culture: >=100000 — AB

## 2022-11-08 NOTE — Telephone Encounter (Signed)
She called today about her culture come back and it was [facility] and she wants to know if that is ok. I told her that is normal for pt to get ecoli and it is in our body naturally. They have send her atb and she will take it 1 tablet twice a day . Pt ok with this

## 2022-11-19 ENCOUNTER — Other Ambulatory Visit: Payer: Self-pay | Admitting: *Deleted

## 2022-11-19 MED ORDER — ALPRAZOLAM 0.5 MG PO TABS: 0.5000 mg | ORAL_TABLET | Freq: Two times a day (BID) | ORAL | 0 refills | Status: DC

## 2022-11-21 ENCOUNTER — Inpatient Hospital Stay: Payer: Medicare PPO

## 2022-11-21 ENCOUNTER — Inpatient Hospital Stay: Payer: Medicare PPO | Admitting: Internal Medicine

## 2022-11-22 ENCOUNTER — Other Ambulatory Visit: Payer: Self-pay | Admitting: *Deleted

## 2022-11-22 MED ORDER — ALPRAZOLAM 0.5 MG PO TABS: 0.5000 mg | ORAL_TABLET | Freq: Three times a day (TID) | ORAL | 0 refills | Status: DC

## 2022-11-22 NOTE — Progress Notes (Signed)
error 

## 2022-11-27 ENCOUNTER — Inpatient Hospital Stay: Payer: Medicare PPO

## 2022-11-27 ENCOUNTER — Other Ambulatory Visit: Payer: Self-pay | Admitting: Pharmacist

## 2022-11-30 ENCOUNTER — Other Ambulatory Visit: Payer: Self-pay | Admitting: *Deleted

## 2022-11-30 DIAGNOSIS — C8331 Diffuse large B-cell lymphoma, lymph nodes of head, face, and neck: Secondary | ICD-10-CM

## 2022-12-04 ENCOUNTER — Inpatient Hospital Stay: Payer: Medicare PPO

## 2022-12-04 ENCOUNTER — Inpatient Hospital Stay: Payer: Medicare PPO | Attending: Internal Medicine | Admitting: Internal Medicine

## 2022-12-04 ENCOUNTER — Encounter: Payer: Self-pay | Admitting: Internal Medicine

## 2022-12-04 VITALS — BP 103/72 | HR 96 | Temp 98.5°F | Resp 18 | Wt 174.7 lb

## 2022-12-04 DIAGNOSIS — F419 Anxiety disorder, unspecified: Secondary | ICD-10-CM | POA: Diagnosis not present

## 2022-12-04 DIAGNOSIS — Z79899 Other long term (current) drug therapy: Secondary | ICD-10-CM | POA: Diagnosis not present

## 2022-12-04 DIAGNOSIS — C8331 Diffuse large B-cell lymphoma, lymph nodes of head, face, and neck: Secondary | ICD-10-CM | POA: Insufficient documentation

## 2022-12-04 DIAGNOSIS — K760 Fatty (change of) liver, not elsewhere classified: Secondary | ICD-10-CM | POA: Insufficient documentation

## 2022-12-04 DIAGNOSIS — Z95828 Presence of other vascular implants and grafts: Secondary | ICD-10-CM

## 2022-12-04 LAB — CBC WITH DIFFERENTIAL/PLATELET
Abs Immature Granulocytes: 0.01 10*3/uL (ref 0.00–0.07)
Basophils Absolute: 0.1 10*3/uL (ref 0.0–0.1)
Basophils Relative: 1 %
Eosinophils Absolute: 0.1 10*3/uL (ref 0.0–0.5)
Eosinophils Relative: 2 %
HCT: 36.5 % (ref 36.0–46.0)
Hemoglobin: 12.7 g/dL (ref 12.0–15.0)
Immature Granulocytes: 0 %
Lymphocytes Relative: 10 %
Lymphs Abs: 0.6 10*3/uL — ABNORMAL LOW (ref 0.7–4.0)
MCH: 33.2 pg (ref 26.0–34.0)
MCHC: 34.8 g/dL (ref 30.0–36.0)
MCV: 95.5 fL (ref 80.0–100.0)
Monocytes Absolute: 0.5 10*3/uL (ref 0.1–1.0)
Monocytes Relative: 8 %
Neutro Abs: 4.8 10*3/uL (ref 1.7–7.7)
Neutrophils Relative %: 79 %
Platelets: 217 10*3/uL (ref 150–400)
RBC: 3.82 MIL/uL — ABNORMAL LOW (ref 3.87–5.11)
RDW: 13.5 % (ref 11.5–15.5)
WBC: 6.1 10*3/uL (ref 4.0–10.5)
nRBC: 0 % (ref 0.0–0.2)

## 2022-12-04 LAB — LACTATE DEHYDROGENASE: LDH: 122 U/L (ref 98–192)

## 2022-12-04 LAB — COMPREHENSIVE METABOLIC PANEL
ALT: 36 U/L (ref 0–44)
AST: 46 U/L — ABNORMAL HIGH (ref 15–41)
Albumin: 4 g/dL (ref 3.5–5.0)
Alkaline Phosphatase: 113 U/L (ref 38–126)
Anion gap: 7 (ref 5–15)
BUN: 11 mg/dL (ref 8–23)
CO2: 27 mmol/L (ref 22–32)
Calcium: 9 mg/dL (ref 8.9–10.3)
Chloride: 103 mmol/L (ref 98–111)
Creatinine, Ser: 0.67 mg/dL (ref 0.44–1.00)
GFR, Estimated: >60 mL/min (ref >60)
Glucose, Bld: 114 mg/dL — ABNORMAL HIGH (ref 70–99)
Potassium: 3.9 mmol/L (ref 3.5–5.1)
Sodium: 137 mmol/L (ref 135–145)
Total Bilirubin: 0.8 mg/dL (ref 0.3–1.2)
Total Protein: 7.2 g/dL (ref 6.5–8.1)

## 2022-12-04 MED ORDER — SODIUM CHLORIDE 0.9% FLUSH
10.0000 mL | Freq: Once | INTRAVENOUS | Status: AC
  Administered 2022-12-04: 10 mL via INTRAVENOUS
  Filled 2022-12-04: qty 10

## 2022-12-04 MED ORDER — HEPARIN SOD (PORK) LOCK FLUSH 100 UNIT/ML IV SOLN
500.0000 [IU] | Freq: Once | INTRAVENOUS | Status: AC
  Administered 2022-12-04: 500 [IU] via INTRAVENOUS
  Filled 2022-12-04: qty 5

## 2022-12-04 NOTE — Progress Notes (Signed)
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 Biopsy is suggestive of fatty liver changes  # carcinoma of lung status post left upper lobe resection in July of 2014; Adenocarcinoma T1N0 M0 tumor EGFR positive  SURGICAL PATHOLOGY  CASE: [id number]  PATIENT: Julie Jennings  Surgical Pathology Report      Specimen Submitted:  A. Lymph node, left neck   Clinical History: New left cervical lymphadenopathy.  History of  lymphoma and lung cancer.  New lymphadenopathy    MAY 17th, 2023- [Dr.juengle]; A. LYMPH NODE, LEFT NECK; ULTRASOUND-GUIDED BIOPSY:  - FINDINGS COMPATIBLE WITH LARGE B-CELL LYMPHOMA.   Comment:  Core biopsy sections display lymphoid tissue with a somewhat effaced  immunoarchitecture.  Portions of nodal tissue are comprised of sheets of  small lymphocytes, while other areas display dense background fibrosis,  and aggregates of large abnormal lymphocytes with irregular nuclear  contours, open chromatin, visible nucleoli, and retraction artifact.   Immunohistochemical studies demonstrate diffuse positivity for CD20  within regions comprised of larger lymphocytes, compatible with B cells.  CD3 highlights a background of small T cells.  CK AE1/AE3 is negative  for metastatic carcinoma.  PAX5 displays dim, patchy marking of larger  abnormal B cells.  In addition, these B cells appear to display dim  expression of CD10, with  positivity for Bcl-2, BCL6, and Mum-1.  B cells  are negative for CD5.  CD30 displays increased marking.  C-Myc is  positive, staining greater than 40% of larger cells.  CD45 (LCA) is  diffusely positive, without aberrant loss of expression.   Concurrent flow cytometric studies demonstrate a CD10 positive  monoclonal B-cell population in a background of many polytypic B cells,  representing 3% of total viable lymphoid cells and 8% of B cells.  For  further details, see scanned report in [facility].   The patient's history is of diffuse large B-cell lymphoma, as well as  invasive adenocarcinoma of the lung are noted.  Biopsy sections  demonstrate an abnormal proliferation of large B cells, compatible with  involvement by a diffuse large B-cell lymphoma. Further  subclassification is difficult, secondary to limited tissue, however,  presence of C10 marking would suggest a germinal center immunophenotype.  In addition, there does appear to be expression of both Bcl-2 and c-myc,  which may suggest a more aggressive process. There is no evidence of  metastatic adenocarcinoma. FISH testing for prognostically significant  abnormalities of BCL2, BCL6, and MYC will be attempted, and reported as  an addendum.   IMPRESSION: 1. Enlarged hypermetabolic lymph nodes in the bilateral left greater than right neck and asymmetric right palatine tonsil hypermetabolism with associated soft tissue fullness on the CT images, all new since 2014 PET-CT, most compatible with recurrent lymphoma. Deauville category 5.  # MAY 12th, 2023- DLBCL- STAGE II [NO Bone marrow]; GCB; double expresser-mcy; Bcl-2; QNS-FISH.   # MAY 30th, 2023- RCEOP q 3 W x3-RT [finished radiation-sep 7th, 2023]   Cancer of upper lobe of  left lung (HCC)  Diffuse large B-cell lymphoma of lymph nodes of neck ([place])  04/25/2022 Initial Diagnosis   Diffuse large B-cell lymphoma of lymph nodes of neck ([place])   04/26/2022 Cancer Staging   Staging  form: Hodgkin and Non-Hodgkin Lymphoma, AJCC 8th Edition - Clinical: Stage II - Signed by Julie Sickle, MD on 04/26/2022 Histopathologic type: Malignant lymphoma, large B-cell, diffuse, NOS   05/08/2022 - 06/22/2022 Chemotherapy   [name]       INTERVAL HISTORY: Patient is accompanied by her friend.   She is ambulating independently.  Julie Jennings 71 y.o.  female pleasant patient with extreme anxiety; and diffuse large B cell lymphoma [prior history of 2005 DLCBL]  is here for follow-up/ review the results of PET scan.   Patient currently status post R-CEOP cycle #3; also followed by radiation approximately  2 months ago.   In the interim patient was evaluated by PCP for anxiety.  Also treated for UTI.  Patient denies new problems/concerns today. Complains of dry mouth.   No weight loss.    Patient denies any worsening enlargement of her lymph nodes.    Review of Systems  Constitutional:  Negative for chills, diaphoresis, fever, malaise/fatigue and weight loss.  HENT:  Negative for nosebleeds and sore throat.   Eyes:  Negative for double vision.  Respiratory:  Negative for cough, hemoptysis, sputum production, shortness of breath and wheezing.   Cardiovascular:  Negative for chest pain, palpitations, orthopnea and leg swelling.  Gastrointestinal:  Negative for abdominal pain, blood in stool, constipation, diarrhea, heartburn, melena, nausea and vomiting.  Musculoskeletal:  Positive for joint pain. Negative for back pain.  Skin: Negative.  Negative for itching and rash.  Neurological:  Negative for dizziness, tingling, focal weakness, weakness and headaches.  Endo/Heme/Allergies:  Does not bruise/bleed easily.  Psychiatric/Behavioral:  Negative for depression. The patient is nervous/anxious. The patient does not have insomnia.      PAST MEDICAL HISTORY :  Past Medical History:  Diagnosis Date   A-fib ([place])     Only once   Anxiety    Arthritis    oesteoarthritis   BP (high blood pressure) 04/16/2014   Chronic kidney disease    history nephrolithiasis   Depression    Dysrhythmia    PSVT   Endometriosis    Herpes zoster    History of kidney stones    Lung cancer ([place]) left   Lymphoma ([place])    "stomach"   Stroke ([place])     PAST SURGICAL HISTORY :   Past Surgical History:  Procedure Laterality Date   AUGMENTATION MAMMAPLASTY Bilateral    CHOLECYSTECTOMY     COLONOSCOPY     COLONOSCOPY WITH PROPOFOL N/A 04/14/2018   Procedure: COLONOSCOPY WITH PROPOFOL;  Surgeon: Manya Silvas, MD;  Location: [facility] ENDOSCOPY;  Service: Endoscopy;  Laterality: N/A;   DILATION AND CURETTAGE OF UTERUS     HEMORRHOIDECTOMY WITH HEMORRHOID BANDING     HERNIA REPAIR     umbilical hernia   IR IMAGING GUIDED PORT INSERTION  05/04/2022   LUNG REMOVAL, PARTIAL Left    REVERSE SHOULDER ARTHROPLASTY Left 01/04/2022   Procedure: Left reverse shoulder arthroplasty, biceps tenodesis;  Surgeon: Leim Fabry, MD;  Location: [facility] ORS;  Service: Orthopedics;  Laterality: Left;    FAMILY HISTORY :   Family History  Problem Relation Age of Onset   Stroke Mother    Diabetes Mother  Colon cancer Father    Prostate cancer Father    Breast cancer Neg Hx     SOCIAL HISTORY:   Social History   Tobacco Use   Smoking status: Never   Smokeless tobacco: Never  Vaping Use   Vaping Use: Never used  Substance Use Topics   Alcohol use: No   Drug use: No    ALLERGIES:  is allergic to tylenol [acetaminophen], buspirone, and codeine.  MEDICATIONS:  Current Outpatient Medications  Medication Sig Dispense Refill   ALPRAZolam (XANAX) 0.5 MG tablet Take 1 tablet (0.5 mg total) by mouth 3 (three) times daily. 5 tablet 0   ARIPiprazole (ABILIFY) 2 MG tablet Take 2 mg by mouth daily.     aspirin 81 MG chewable tablet Chew by mouth.     furosemide (LASIX) 40 MG tablet Take 40 mg by mouth daily.     losartan (COZAAR)  100 MG tablet Take 100 mg by mouth daily.     magnesium oxide (MAG-OX) 400 (240 Mg) MG tablet Take 1 tablet by mouth 2 (two) times daily.     nebivolol (BYSTOLIC) 10 MG tablet Take 10 mg by mouth daily.     pantoprazole (PROTONIX) 40 MG tablet Take 40 mg by mouth 2 (two) times daily.     Polyethyl Glycol-Propyl Glycol (SYSTANE) 0.4-0.3 % SOLN Place 1 drop into both eyes daily as needed (Dry eye).     venlafaxine XR (EFFEXOR-XR) 75 MG 24 hr capsule Take 3 capsules (225 mg total) by mouth daily with breakfast. (Patient taking differently: Take 75 mg by mouth in the morning and at bedtime.) 90 capsule 1   lidocaine-prilocaine (EMLA) cream Apply on the port. 30 -45 min  prior to port access. (Patient not taking: Reported on 11/06/2022) 30 g 3   nitrofurantoin, macrocrystal-monohydrate, (MACROBID) 100 MG capsule Take 1 capsule (100 mg total) by mouth 2 (two) times daily. (Patient not taking: Reported on 12/04/2022) 14 capsule 0   ondansetron (ZOFRAN) 8 MG tablet Every 8 hours as needed for nausea/vomitting. (Patient not taking: Reported on 11/06/2022) 40 tablet 2   prochlorperazine (COMPAZINE) 10 MG tablet Take 1 tablet (10 mg total) by mouth every 6 (six) hours as needed for nausea or vomiting. (Patient not taking: Reported on 11/06/2022) 40 tablet 0   QUEtiapine (SEROQUEL) 25 MG tablet Take 1 tablet (25 mg total) by mouth at bedtime. (Patient not taking: Reported on 12/04/2022) 90 tablet 0   tiotropium (SPIRIVA) 18 MCG inhalation capsule Place 18 mcg into inhaler and inhale daily. (Patient not taking: Reported on 11/06/2022)     triamcinolone (NASACORT) 55 MCG/ACT AERO nasal inhaler Place 2 sprays into the nose daily. (Patient not taking: Reported on 11/06/2022)     No current facility-administered medications for this visit.    PHYSICAL EXAMINATION: ECOG PERFORMANCE STATUS: 0 - Asymptomatic  BP 103/72 (BP Location: Right Arm, Patient Position: Sitting)   Pulse 96   Temp 98.5 F (36.9 C)  (Tympanic)   Resp 18   Wt 174 lb 11.2 oz (79.2 kg)   SpO2 97%   BMI 27.36 kg/m   Filed Weights   12/04/22 1500  Weight: 174 lb 11.2 oz (79.2 kg)     Left neck lymphadenopathy 2 to 3 cm in size noted.  Nontender.  Physical Exam HENT:     Head: Normocephalic and atraumatic.     Mouth/Throat:     Pharynx: No oropharyngeal exudate.  Eyes:     Pupils: Pupils are equal, round,  and reactive to light.  Cardiovascular:     Rate and Rhythm: Normal rate and regular rhythm.  Pulmonary:     Effort: Pulmonary effort is normal. No respiratory distress.     Breath sounds: Normal breath sounds. No wheezing.  Abdominal:     General: Bowel sounds are normal. There is no distension.     Palpations: Abdomen is soft. There is no mass.     Tenderness: There is no abdominal tenderness. There is no guarding or rebound.  Musculoskeletal:        General: No tenderness. Normal range of motion.     Cervical back: Normal range of motion and neck supple.  Skin:    General: Skin is warm.  Neurological:     Mental Status: She is alert and oriented to person, place, and time.  Psychiatric:        Mood and Affect: Affect normal.      LABORATORY DATA:  I have reviewed the data as listed    Component Value Date/Time   NA 137 12/04/2022 1435   NA 139 03/14/2015 0856   K 3.9 12/04/2022 1435   K 4.1 03/14/2015 0856   CL 103 12/04/2022 1435   CL 103 03/14/2015 0856   CO2 27 12/04/2022 1435   CO2 28 03/14/2015 0856   GLUCOSE 114 (H) 12/04/2022 1435   GLUCOSE 111 (H) 03/14/2015 0856   BUN 11 12/04/2022 1435   BUN 19 03/14/2015 0856   CREATININE 0.67 12/04/2022 1435   CREATININE 0.56 03/14/2015 0856   CALCIUM 9.0 12/04/2022 1435   CALCIUM 9.3 03/14/2015 0856   PROT 7.2 12/04/2022 1435   PROT 7.7 03/14/2015 0856   ALBUMIN 4.0 12/04/2022 1435   ALBUMIN 4.3 03/14/2015 0856   AST 46 (H) 12/04/2022 1435   AST 80 (H) 03/14/2015 0856   ALT 36 12/04/2022 1435   ALT 87 (H) 03/14/2015 0856    ALKPHOS 113 12/04/2022 1435   ALKPHOS 164 (H) 03/14/2015 0856   BILITOT 0.8 12/04/2022 1435   BILITOT 0.8 03/14/2015 0856   GFRNONAA >60 12/04/2022 1435   GFRNONAA >60 03/14/2015 0856   GFRAA >60 07/07/2020 1041   GFRAA >60 03/14/2015 0856    No results found for: "SPEP", "UPEP"  Lab Results  Component Value Date   WBC 6.1 12/04/2022   NEUTROABS 4.8 12/04/2022   HGB 12.7 12/04/2022   HCT 36.5 12/04/2022   MCV 95.5 12/04/2022   PLT 217 12/04/2022      Chemistry      Component Value Date/Time   NA 137 12/04/2022 1435   NA 139 03/14/2015 0856   K 3.9 12/04/2022 1435   K 4.1 03/14/2015 0856   CL 103 12/04/2022 1435   CL 103 03/14/2015 0856   CO2 27 12/04/2022 1435   CO2 28 03/14/2015 0856   BUN 11 12/04/2022 1435   BUN 19 03/14/2015 0856   CREATININE 0.67 12/04/2022 1435   CREATININE 0.56 03/14/2015 0856      Component Value Date/Time   CALCIUM 9.0 12/04/2022 1435   CALCIUM 9.3 03/14/2015 0856   ALKPHOS 113 12/04/2022 1435   ALKPHOS 164 (H) 03/14/2015 0856   AST 46 (H) 12/04/2022 1435   AST 80 (H) 03/14/2015 0856   ALT 36 12/04/2022 1435   ALT 87 (H) 03/14/2015 0856   BILITOT 0.8 12/04/2022 1435   BILITOT 0.8 03/14/2015 0856       RADIOGRAPHIC STUDIES: I have personally reviewed the radiological images as listed and agreed with  the findings in the report. No results found.   ASSESSMENT & PLAN:  Diffuse large B-cell lymphoma of lymph nodes of neck (HCC) #Diffuse B-cell lymphoma-bilateral neck left more than right PET scan.  Clinically stage II. Currently s/p R-CEOP chemotherapy every 3 weeks x 3 cycles-followed by involved field radiation.  Patient currently status post radiation-finished radiation on sep 7th. NOV, 2nd, 2023-  Interval resolution of previously noted tracer avid bilateral cervical lymph nodes. Deauville criteria 1;  No signs of residual or recurrent tumor within the chest, abdomen or pelvis.  #We will plan PET scan in approximately 6 weeks  from now; will further evaluate the mild uptake noted in the tonsils.Marland Kitchen  MAY 2023- LEFT ventricular ejection fraction of 64% with normal LV wall motion.    # NOV 2023- incidentally- PET scan: Interval decrease and bilateral and symmetric increased uptake within the palatine tonsils; New mild increased radiotracer uptake within the pharyngeal tonsils is identified, left greater than right. This is a nonspecific finding and may be reactive in etiology. Recommend evaluation with Dr.Jeungle.   # Anxiety: On anxiolytic as per PCP.  Stable.  # Slightly intermittent elevated LFts-?  Fatty liver.  Hepatitis panel normal.- STABLE.   # prophylaxis Shingles: OFF  Acyclovir prophylaxis   #Hypomagnesemia: June-magnesium 1.4; continue magnesium supplementation.   # IV access: port flush   # DISPOSITION: # follow up in 6 weeks- MD;  Cbc/cmp/ldh; port flush/ prior- PET scan-- Dr.B  # I reviewed the blood work- with the patient in detail; also reviewed the imaging independently [as summarized above]; and with the patient in detail.        Orders Placed This Encounter  Procedures   NM PET Image Restage (PS) Skull Base to Thigh (F-18 FDG)    Standing Status:   Future    Standing Expiration Date:   12/05/2023    Order Specific Question:   If indicated for the ordered procedure, I authorize the administration of a radiopharmaceutical per Radiology protocol    Answer:   Yes    Order Specific Question:   Preferred imaging location?    Answer:   [facility]   Comprehensive metabolic panel    Standing Status:   Future    Standing Expiration Date:   12/05/2023   Lactate dehydrogenase    Standing Status:   Future    Standing Expiration Date:   12/05/2023   All questions were answered. The patient knows to call the clinic with any problems, questions or concerns.      Julie Sickle, MD 12/04/2022 3:48 PM

## 2022-12-04 NOTE — Assessment & Plan Note (Addendum)
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  Clinically stage II. Currently s/p R-CEOP chemotherapy every 3 weeks x 3 cycles-followed by involved field radiation.  Patient currently status post radiation-finished radiation on sep 7th. NOV, 2nd, 2023-  Interval resolution of previously noted tracer avid bilateral cervical lymph nodes. Deauville criteria 1;  No signs of residual or recurrent tumor within the chest, abdomen or pelvis.  #We will plan PET scan in approximately 6 weeks from now; will further evaluate the mild uptake noted in the tonsils.Marland Kitchen  MAY 2023- LEFT ventricular ejection fraction of 64% with normal LV wall motion.    # NOV 2023- incidentally- PET scan: Interval decrease and bilateral and symmetric increased uptake within the palatine tonsils; New mild increased radiotracer uptake within the pharyngeal tonsils is identified, left greater than right. This is a nonspecific finding and may be reactive in etiology. Recommend evaluation with Dr.Jeungle.   # Anxiety: On anxiolytic as per PCP.  Stable.  # Slightly intermittent elevated LFts-?  Fatty liver.  Hepatitis panel normal.- STABLE.   # prophylaxis Shingles: OFF  Acyclovir prophylaxis   #Hypomagnesemia: June-magnesium 1.4; continue magnesium supplementation.   # IV access: port flush   # DISPOSITION: # follow up in 6 weeks- MD;  Cbc/cmp/ldh; port flush/ prior- PET scan-- Dr.B  # I reviewed the blood work- with the patient in detail; also reviewed the imaging independently [as summarized above]; and with the patient in detail.

## 2022-12-04 NOTE — Progress Notes (Signed)
Patient denies new problems/concerns today.   °

## 2023-01-02 ENCOUNTER — Ambulatory Visit: Payer: Medicare PPO

## 2023-01-15 ENCOUNTER — Ambulatory Visit: Admission: RE | Admit: 2023-01-15 | Payer: Medicare PPO | Source: Ambulatory Visit

## 2023-01-15 ENCOUNTER — Ambulatory Visit: Payer: Medicare PPO | Admitting: Internal Medicine

## 2023-01-15 ENCOUNTER — Other Ambulatory Visit: Payer: Medicare PPO

## 2023-01-21 ENCOUNTER — Telehealth: Payer: Self-pay | Admitting: *Deleted

## 2023-01-21 NOTE — Telephone Encounter (Signed)
Pt called stating that she did not get pet scan when it was due for because she had got sick and was vomiting that day. Her new appt is 2/21. She is having port labs tom 3 pm and we pushed her appt with Dr. Rogue Bussing to see her 3-4 days after the scan. The next available appt for him is March 4 at 10:45. Pt agreeable to this plan

## 2023-01-22 ENCOUNTER — Inpatient Hospital Stay: Payer: Medicare PPO | Admitting: Internal Medicine

## 2023-01-22 ENCOUNTER — Inpatient Hospital Stay: Payer: Medicare PPO | Attending: Internal Medicine

## 2023-01-22 DIAGNOSIS — C8331 Diffuse large B-cell lymphoma, lymph nodes of head, face, and neck: Secondary | ICD-10-CM | POA: Insufficient documentation

## 2023-01-22 DIAGNOSIS — C8590 Non-Hodgkin lymphoma, unspecified, unspecified site: Secondary | ICD-10-CM

## 2023-01-22 DIAGNOSIS — Z95828 Presence of other vascular implants and grafts: Secondary | ICD-10-CM

## 2023-01-22 LAB — CBC WITH DIFFERENTIAL/PLATELET
Abs Immature Granulocytes: 0.02 10*3/uL (ref 0.00–0.07)
Basophils Absolute: 0 10*3/uL (ref 0.0–0.1)
Basophils Relative: 1 %
Eosinophils Absolute: 0.1 10*3/uL (ref 0.0–0.5)
Eosinophils Relative: 1 %
HCT: 34.3 % — ABNORMAL LOW (ref 36.0–46.0)
Hemoglobin: 12.1 g/dL (ref 12.0–15.0)
Immature Granulocytes: 0 %
Lymphocytes Relative: 8 %
Lymphs Abs: 0.6 10*3/uL — ABNORMAL LOW (ref 0.7–4.0)
MCH: 32.6 pg (ref 26.0–34.0)
MCHC: 35.3 g/dL (ref 30.0–36.0)
MCV: 92.5 fL (ref 80.0–100.0)
Monocytes Absolute: 0.5 10*3/uL (ref 0.1–1.0)
Monocytes Relative: 6 %
Neutro Abs: 6 10*3/uL (ref 1.7–7.7)
Neutrophils Relative %: 84 %
Platelets: 194 10*3/uL (ref 150–400)
RBC: 3.71 MIL/uL — ABNORMAL LOW (ref 3.87–5.11)
RDW: 13 % (ref 11.5–15.5)
WBC: 7.1 10*3/uL (ref 4.0–10.5)
nRBC: 0 % (ref 0.0–0.2)

## 2023-01-22 LAB — COMPREHENSIVE METABOLIC PANEL
ALT: 28 U/L (ref 0–44)
AST: 40 U/L (ref 15–41)
Albumin: 3.9 g/dL (ref 3.5–5.0)
Alkaline Phosphatase: 124 U/L (ref 38–126)
Anion gap: 9 (ref 5–15)
BUN: 12 mg/dL (ref 8–23)
CO2: 28 mmol/L (ref 22–32)
Calcium: 8.9 mg/dL (ref 8.9–10.3)
Chloride: 100 mmol/L (ref 98–111)
Creatinine, Ser: 0.78 mg/dL (ref 0.44–1.00)
GFR, Estimated: >60 mL/min (ref >60)
Glucose, Bld: 110 mg/dL — ABNORMAL HIGH (ref 70–99)
Potassium: 3.1 mmol/L — ABNORMAL LOW (ref 3.5–5.1)
Sodium: 137 mmol/L (ref 135–145)
Total Bilirubin: 0.8 mg/dL (ref 0.3–1.2)
Total Protein: 7.1 g/dL (ref 6.5–8.1)

## 2023-01-22 LAB — LACTATE DEHYDROGENASE: LDH: 131 U/L (ref 98–192)

## 2023-01-22 MED ORDER — HEPARIN SOD (PORK) LOCK FLUSH 100 UNIT/ML IV SOLN
500.0000 [IU] | Freq: Once | INTRAVENOUS | Status: AC
  Filled 2023-01-22: qty 5

## 2023-01-22 MED ORDER — HEPARIN SOD (PORK) LOCK FLUSH 100 UNIT/ML IV SOLN
INTRAVENOUS | Status: AC
  Administered 2023-01-22: 500 [IU] via INTRAVENOUS
  Filled 2023-01-22: qty 5

## 2023-01-22 MED ORDER — SODIUM CHLORIDE 0.9% FLUSH
10.0000 mL | INTRAVENOUS | Status: AC | PRN
  Administered 2023-01-22: 10 mL via INTRAVENOUS
  Filled 2023-01-22: qty 10

## 2023-01-30 ENCOUNTER — Ambulatory Visit
Admission: RE | Admit: 2023-01-30 | Discharge: 2023-01-30 | Disposition: A | Discharge status: Home / Self Care | Payer: Medicare PPO | Source: Ambulatory Visit | Attending: Internal Medicine | Admitting: Internal Medicine

## 2023-01-30 DIAGNOSIS — I7121 Aneurysm of the ascending aorta, without rupture: Secondary | ICD-10-CM | POA: Diagnosis not present

## 2023-01-30 DIAGNOSIS — I700 Atherosclerosis of aorta: Secondary | ICD-10-CM | POA: Insufficient documentation

## 2023-01-30 DIAGNOSIS — Z902 Acquired absence of lung [part of]: Secondary | ICD-10-CM | POA: Insufficient documentation

## 2023-01-30 DIAGNOSIS — C8331 Diffuse large B-cell lymphoma, lymph nodes of head, face, and neck: Secondary | ICD-10-CM | POA: Diagnosis present

## 2023-01-30 LAB — GLUCOSE, CAPILLARY: Glucose-Capillary: 99 mg/dL (ref 70–99)

## 2023-01-30 MED ORDER — FLUDEOXYGLUCOSE F - 18 (FDG) INJECTION
9.3700 | Freq: Once | INTRAVENOUS | Status: AC
  Administered 2023-01-30: 9.37 via INTRAVENOUS

## 2023-01-31 ENCOUNTER — Telehealth: Payer: Self-pay | Admitting: *Deleted

## 2023-01-31 NOTE — Telephone Encounter (Signed)
-----   Message from Cammie Sickle, MD sent at 01/31/2023  1:09 PM EST ----- FYI -------- Hi [name]- I wanted to let you know that your PET scan shows no evidence of cancer.  Please keep follow-up as planned; will discuss the scan in detail at that visit.  Thanks GB

## 2023-01-31 NOTE — Telephone Encounter (Signed)
I gave her the information that Dr. Rogue Bussing info on the note and she is happy about results

## 2023-02-08 ENCOUNTER — Other Ambulatory Visit: Payer: Self-pay | Admitting: *Deleted

## 2023-02-08 DIAGNOSIS — C8590 Non-Hodgkin lymphoma, unspecified, unspecified site: Secondary | ICD-10-CM

## 2023-02-11 ENCOUNTER — Inpatient Hospital Stay: Payer: Medicare PPO | Attending: Internal Medicine | Admitting: Internal Medicine

## 2023-02-11 DIAGNOSIS — I4891 Unspecified atrial fibrillation: Secondary | ICD-10-CM | POA: Insufficient documentation

## 2023-02-11 DIAGNOSIS — Z79899 Other long term (current) drug therapy: Secondary | ICD-10-CM | POA: Diagnosis not present

## 2023-02-11 DIAGNOSIS — R251 Tremor, unspecified: Secondary | ICD-10-CM | POA: Diagnosis not present

## 2023-02-11 DIAGNOSIS — F419 Anxiety disorder, unspecified: Secondary | ICD-10-CM | POA: Insufficient documentation

## 2023-02-11 DIAGNOSIS — R682 Dry mouth, unspecified: Secondary | ICD-10-CM | POA: Diagnosis not present

## 2023-02-11 DIAGNOSIS — E876 Hypokalemia: Secondary | ICD-10-CM | POA: Diagnosis not present

## 2023-02-11 DIAGNOSIS — C8331 Diffuse large B-cell lymphoma, lymph nodes of head, face, and neck: Secondary | ICD-10-CM | POA: Diagnosis present

## 2023-02-11 DIAGNOSIS — F32A Depression, unspecified: Secondary | ICD-10-CM | POA: Insufficient documentation

## 2023-02-11 DIAGNOSIS — Z85118 Personal history of other malignant neoplasm of bronchus and lung: Secondary | ICD-10-CM | POA: Insufficient documentation

## 2023-02-11 DIAGNOSIS — Z923 Personal history of irradiation: Secondary | ICD-10-CM | POA: Diagnosis not present

## 2023-02-11 DIAGNOSIS — Z9221 Personal history of antineoplastic chemotherapy: Secondary | ICD-10-CM | POA: Insufficient documentation

## 2023-02-11 MED ORDER — POTASSIUM CHLORIDE CRYS ER 20 MEQ PO TBCR: EXTENDED_RELEASE_TABLET | ORAL | 3 refills | Status: DC

## 2023-02-11 NOTE — Assessment & Plan Note (Addendum)
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  Clinically stage II. Currently s/p R-CEOP chemotherapy every 3 weeks x 3 cycles-followed by involved field radiation.  Patient currently status post radiation-finished radiation on sep 7th. FEB 22nd, 2024- Deauville criteria 1;  No signs of residual or recurrent tumor within the chest, abdomen or pelvis.1. No signs of residual or recurrent FDG avid tumor. Deauville criteria 1. Continue surveillance on a clinical basis.  # NOV 2023- incidentally- PET scan: Interval decrease and bilateral and symmetric increased uptake within the palatine tonsils; New mild increased radiotracer uptake within the pharyngeal tonsils is identified, left greater than right. S/p evaluation with Dr.Jeungle-negative for any recurrent malignancy.  # Anxiety/depression: On anxiolytic as per PCP.  Stable.  # [PET scan- [id number] liver compatible with cirrhosis Slightly intermittent elevated LFts-?  Fatty liver.  Hepatitis panel normal.- referral to GI- [place] re: cirrhosis  # Card" Hx of A.fib [Dr.Fath]- Stable 4 cm ascending thoracic aortic aneurysm. Recommend annual imaging followup by CTA or MRA.-defer to cardiology.  On lasix 40 mg/day- see below;   # Electrolyte- mild hypokalemia/ hypomagnesia [sec to Lasix]- recommend Kdur once a day.refilled.  continue magnesium supplementation. Stable.   # IV access: port flush   #Incidental findings on Imaging  CT , 2024: Aneurysm thoracic; atherosclerosis; cirrhosis I reviewed/discussed /counseled the patient.   # DISPOSITION: # referral to GI- [place] re: cirrhosis # follow up in 3 months- MD;  Cbc/cmp/ldh; port flush/ prior-  Dr.B  # I reviewed the blood work- with the patient in detail; also reviewed the imaging independently [as summarized above]; and with the patient in detail.

## 2023-02-11 NOTE — Progress Notes (Signed)
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 Biopsy is suggestive of fatty liver changes  # carcinoma of lung status post left upper lobe resection in July of 2014; Adenocarcinoma T1N0 M0 tumor EGFR positive  SURGICAL PATHOLOGY  CASE: [id number]  PATIENT: Julie Jennings  Surgical Pathology Report      Specimen Submitted:  A. Lymph node, left neck   Clinical History: New left cervical lymphadenopathy.  History of  lymphoma and lung cancer.  New lymphadenopathy    MAY 17th, 2023- [Dr.juengle]; A. LYMPH NODE, LEFT NECK; ULTRASOUND-GUIDED BIOPSY:  - FINDINGS COMPATIBLE WITH LARGE B-CELL LYMPHOMA.   Comment:  Core biopsy sections display lymphoid tissue with a somewhat effaced  immunoarchitecture.  Portions of nodal tissue are comprised of sheets of  small lymphocytes, while other areas display dense background fibrosis,  and aggregates of large abnormal lymphocytes with irregular nuclear  contours, open chromatin, visible nucleoli, and retraction artifact.   Immunohistochemical studies demonstrate diffuse positivity for CD20  within regions comprised of larger lymphocytes, compatible with B cells.  CD3 highlights a background of small T cells.  CK AE1/AE3 is negative  for metastatic carcinoma.  PAX5 displays dim, patchy marking of larger  abnormal B cells.  In addition, these B cells appear to display dim  expression of CD10, with  positivity for Bcl-2, BCL6, and Mum-1.  B cells  are negative for CD5.  CD30 displays increased marking.  C-Myc is  positive, staining greater than 40% of larger cells.  CD45 (LCA) is  diffusely positive, without aberrant loss of expression.   Concurrent flow cytometric studies demonstrate a CD10 positive  monoclonal B-cell population in a background of many polytypic B cells,  representing 3% of total viable lymphoid cells and 8% of B cells.  For  further details, see scanned report in [facility].   The patient's history is of diffuse large B-cell lymphoma, as well as  invasive adenocarcinoma of the lung are noted.  Biopsy sections  demonstrate an abnormal proliferation of large B cells, compatible with  involvement by a diffuse large B-cell lymphoma. Further  subclassification is difficult, secondary to limited tissue, however,  presence of C10 marking would suggest a germinal center immunophenotype.  In addition, there does appear to be expression of both Bcl-2 and c-myc,  which may suggest a more aggressive process. There is no evidence of  metastatic adenocarcinoma. FISH testing for prognostically significant  abnormalities of BCL2, BCL6, and MYC will be attempted, and reported as  an addendum.   IMPRESSION: 1. Enlarged hypermetabolic lymph nodes in the bilateral left greater than right neck and asymmetric right palatine tonsil hypermetabolism with associated soft tissue fullness on the CT images, all new since 2014 PET-CT, most compatible with recurrent lymphoma. Deauville category 5.  # MAY 12th, 2023- DLBCL- STAGE II [NO Bone marrow]; GCB; double expresser-mcy; Bcl-2; QNS-FISH.   # MAY 30th, 2023- RCEOP q 3 W x3-RT [finished radiation-sep 7th, 2023]   Cancer of upper lobe of  left lung (HCC)  Diffuse large B-cell lymphoma of lymph nodes of neck ([place])  04/25/2022 Initial Diagnosis   Diffuse large B-cell lymphoma of lymph nodes of neck ([place])   04/26/2022 Cancer Staging   Staging  form: Hodgkin and Non-Hodgkin Lymphoma, AJCC 8th Edition - Clinical: Stage II - Signed by Cammie Sickle, MD on 04/26/2022 Histopathologic type: Malignant lymphoma, large B-cell, diffuse, NOS   05/08/2022 - 06/22/2022 Chemotherapy   [name]      INTERVAL HISTORY: Patient is accompanied by her friend.   She is ambulating independently.  Julie Jennings 72 y.o.  female pleasant patient with extreme anxiety; and diffuse large B cell lymphoma [prior history of 2005 DLCBL]  is here for follow-up/ review the results of PET scan.   Patient currently status post R-CEOP cycle #3; also followed by radiation finished approximately  4  months ago.  S/p evaluation with ENT-or postnasal voice.  Patient not taking Magnesium or Spiriva and not sure if she needs to take.    Chronic mild tremor.  Chronic dry mouth.   No weight loss. patient denies any worsening enlargement of her lymph nodes.    Review of Systems  Constitutional:  Negative for chills, diaphoresis, fever, malaise/fatigue and weight loss.  HENT:  Negative for nosebleeds and sore throat.   Eyes:  Negative for double vision.  Respiratory:  Negative for cough, hemoptysis, sputum production, shortness of breath and wheezing.   Cardiovascular:  Negative for chest pain, palpitations, orthopnea and leg swelling.  Gastrointestinal:  Negative for abdominal pain, blood in stool, constipation, diarrhea, heartburn, melena, nausea and vomiting.  Musculoskeletal:  Positive for joint pain. Negative for back pain.  Skin: Negative.  Negative for itching and rash.  Neurological:  Negative for dizziness, tingling, focal weakness, weakness and headaches.  Endo/Heme/Allergies:  Does not bruise/bleed easily.  Psychiatric/Behavioral:  Negative for depression. The patient is nervous/anxious. The patient does not have insomnia.      PAST MEDICAL HISTORY :  Past Medical History:  Diagnosis  Date   A-fib ([place])    Only once   Anxiety    Arthritis    oesteoarthritis   BP (high blood pressure) 04/16/2014   Chronic kidney disease    history nephrolithiasis   Depression    Dysrhythmia    PSVT   Endometriosis    Herpes zoster    History of kidney stones    Lung cancer ([place]) left   Lymphoma ([place])    "stomach"   Stroke ([place])     PAST SURGICAL HISTORY :   Past Surgical History:  Procedure Laterality Date   AUGMENTATION MAMMAPLASTY Bilateral    CHOLECYSTECTOMY     COLONOSCOPY     COLONOSCOPY WITH PROPOFOL N/A 04/14/2018   Procedure: COLONOSCOPY WITH PROPOFOL;  Surgeon: Manya Silvas, MD;  Location: [facility] END[facility]COPY;  Service: Endoscopy;  Laterality: N/A;   DILATION AND CURETTAGE OF UTERUS     HEMORRHOIDECTOMY WITH HEMORRHOID BANDING     HERNIA REPAIR     umbilical hernia   IR IMAGING GUIDED PORT INSERTION  05/04/2022   LUNG REMOVAL, PARTIAL Left    REVERSE SHOULDER ARTHROPLASTY Left 01/04/2022   Procedure: Left reverse shoulder arthroplasty, biceps tenodesis;  Surgeon: Leim Fabry, MD;  Location: [facility] ORS;  Service: Orthopedics;  Laterality: Left;    FAMILY HISTORY :   Family History  Problem Relation Age of Onset   Stroke Mother  Diabetes Mother    Colon cancer Father    Prostate cancer Father    Breast cancer Neg Hx     SOCIAL HISTORY:   Social History   Tobacco Use   Smoking status: Never   Smokeless tobacco: Never  Vaping Use   Vaping Use: Never used  Substance Use Topics   Alcohol use: No   Drug use: No    ALLERGIES:  is allergic to tylenol [acetaminophen], buspirone, and codeine.  MEDICATIONS:  Current Outpatient Medications  Medication Sig Dispense Refill   ALPRAZolam (XANAX) 0.5 MG tablet Take 1 tablet (0.5 mg total) by mouth 3 (three) times daily. 5 tablet 0   aspirin 81 MG chewable tablet Chew by mouth.     furosemide (LASIX) 40 MG tablet Take 40 mg by mouth daily.     lidocaine-prilocaine (EMLA) cream Apply on the port. 30 -45 min   prior to port access. 30 g 3   losartan (COZAAR) 100 MG tablet Take 100 mg by mouth daily.     nebivolol (BYSTOLIC) 10 MG tablet Take 10 mg by mouth daily.     pantoprazole (PROTONIX) 40 MG tablet Take 40 mg by mouth 2 (two) times daily.     Polyethyl Glycol-Propyl Glycol (SYSTANE) 0.4-0.3 % SOLN Place 1 drop into both eyes daily as needed (Dry eye).     potassium chloride SA (KLOR-CON M) 20 MEQ tablet One pill a day. 30 tablet 3   QUEtiapine (SEROQUEL) 25 MG tablet Take 1 tablet (25 mg total) by mouth at bedtime. 90 tablet 0   venlafaxine XR (EFFEXOR-XR) 75 MG 24 hr capsule Take 3 capsules (225 mg total) by mouth daily with breakfast. (Patient taking differently: Take 75 mg by mouth 2 (two) times daily.) 90 capsule 1   ARIPiprazole (ABILIFY) 2 MG tablet Take 2 mg by mouth daily.     magnesium oxide (MAG-OX) 400 (240 Mg) MG tablet Take 1 tablet by mouth 2 (two) times daily. (Patient not taking: Reported on 02/11/2023)     nitrofurantoin, macrocrystal-monohydrate, (MACROBID) 100 MG capsule Take 1 capsule (100 mg total) by mouth 2 (two) times daily. (Patient not taking: Reported on 12/04/2022) 14 capsule 0   ondansetron (ZOFRAN) 8 MG tablet Every 8 hours as needed for nausea/vomitting. (Patient not taking: Reported on 11/06/2022) 40 tablet 2   prochlorperazine (COMPAZINE) 10 MG tablet Take 1 tablet (10 mg total) by mouth every 6 (six) hours as needed for nausea or vomiting. (Patient not taking: Reported on 11/06/2022) 40 tablet 0   tiotropium (SPIRIVA) 18 MCG inhalation capsule Place 18 mcg into inhaler and inhale daily. (Patient not taking: Reported on 11/06/2022)     triamcinolone (NASACORT) 55 MCG/ACT AERO nasal inhaler Place 2 sprays into the nose daily. (Patient not taking: Reported on 11/06/2022)     No current facility-administered medications for this visit.   Facility-Administered Medications Ordered in Other Visits  Medication Dose Route Frequency Provider Last Rate Last Admin   sodium  chloride flush (NS) 0.9 % injection 10 mL  10 mL Intravenous PRN Cammie Sickle, MD   10 mL at 01/22/23 1452    PHYSICAL EXAMINATION: ECOG PERFORMANCE STATUS: 0 - Asymptomatic  BP 123/80 (BP Location: Right Arm, Patient Position: Sitting)   Pulse 82   Temp 98.4 F (36.9 C) (Tympanic)   Resp 17   Wt 171 lb 3.2 oz (77.7 kg)   SpO2 97%   BMI 26.81 kg/m   Filed Weights   02/11/23 1000  Weight: 171 lb 3.2 oz (77.7 kg)      Left neck lymphadenopathy 2 to 3 cm in size noted.  Nontender.  Physical Exam HENT:     Head: Normocephalic and atraumatic.     Mouth/Throat:     Pharynx: No oropharyngeal exudate.  Eyes:     Pupils: Pupils are equal, round, and reactive to light.  Cardiovascular:     Rate and Rhythm: Normal rate and regular rhythm.  Pulmonary:     Effort: Pulmonary effort is normal. No respiratory distress.     Breath sounds: Normal breath sounds. No wheezing.  Abdominal:     General: Bowel sounds are normal. There is no distension.     Palpations: Abdomen is soft. There is no mass.     Tenderness: There is no abdominal tenderness. There is no guarding or rebound.  Musculoskeletal:        General: No tenderness. Normal range of motion.     Cervical back: Normal range of motion and neck supple.  Skin:    General: Skin is warm.  Neurological:     Mental Status: She is alert and oriented to person, place, and time.  Psychiatric:        Mood and Affect: Affect normal.      LABORATORY DATA:  I have reviewed the data as listed    Component Value Date/Time   NA 137 01/22/2023 1451   NA 139 03/14/2015 0856   K 3.1 (L) 01/22/2023 1451   K 4.1 03/14/2015 0856   CL 100 01/22/2023 1451   CL 103 03/14/2015 0856   CO2 28 01/22/2023 1451   CO2 28 03/14/2015 0856   GLUCOSE 110 (H) 01/22/2023 1451   GLUCOSE 111 (H) 03/14/2015 0856   BUN 12 01/22/2023 1451   BUN 19 03/14/2015 0856   CREATININE 0.78 01/22/2023 1451   CREATININE 0.56 03/14/2015 0856   CALCIUM  8.9 01/22/2023 1451   CALCIUM 9.3 03/14/2015 0856   PROT 7.1 01/22/2023 1451   PROT 7.7 03/14/2015 0856   ALBUMIN 3.9 01/22/2023 1451   ALBUMIN 4.3 03/14/2015 0856   AST 40 01/22/2023 1451   AST 80 (H) 03/14/2015 0856   ALT 28 01/22/2023 1451   ALT 87 (H) 03/14/2015 0856   ALKPHOS 124 01/22/2023 1451   ALKPHOS 164 (H) 03/14/2015 0856   BILITOT 0.8 01/22/2023 1451   BILITOT 0.8 03/14/2015 0856   GFRNONAA >60 01/22/2023 1451   GFRNONAA >60 03/14/2015 0856   GFRAA >60 07/07/2020 1041   GFRAA >60 03/14/2015 0856    No results found for: "SPEP", "UPEP"  Lab Results  Component Value Date   WBC 7.1 01/22/2023   NEUTROABS 6.0 01/22/2023   HGB 12.1 01/22/2023   HCT 34.3 (L) 01/22/2023   MCV 92.5 01/22/2023   PLT 194 01/22/2023      Chemistry      Component Value Date/Time   NA 137 01/22/2023 1451   NA 139 03/14/2015 0856   K 3.1 (L) 01/22/2023 1451   K 4.1 03/14/2015 0856   CL 100 01/22/2023 1451   CL 103 03/14/2015 0856   CO2 28 01/22/2023 1451   CO2 28 03/14/2015 0856   BUN 12 01/22/2023 1451   BUN 19 03/14/2015 0856   CREATININE 0.78 01/22/2023 1451   CREATININE 0.56 03/14/2015 0856      Component Value Date/Time   CALCIUM 8.9 01/22/2023 1451   CALCIUM 9.3 03/14/2015 0856   ALKPHOS 124 01/22/2023 1451   ALKPHOS 164 (  H) 03/14/2015 0856   AST 40 01/22/2023 1451   AST 80 (H) 03/14/2015 0856   ALT 28 01/22/2023 1451   ALT 87 (H) 03/14/2015 0856   BILITOT 0.8 01/22/2023 1451   BILITOT 0.8 03/14/2015 0856       RADIOGRAPHIC STUDIES: I have personally reviewed the radiological images as listed and agreed with the findings in the report. No results found.   ASSESSMENT & PLAN:  Diffuse large B-cell lymphoma of lymph nodes of neck (HCC) #Diffuse B-cell lymphoma-bilateral neck left more than right PET scan.  Clinically stage II. Currently s/p R-CEOP chemotherapy every 3 weeks x 3 cycles-followed by involved field radiation.  Patient currently status post  radiation-finished radiation on sep 7th. FEB 22nd, 2024- Deauville criteria 1;  No signs of residual or recurrent tumor within the chest, abdomen or pelvis.1. No signs of residual or recurrent FDG avid tumor. Deauville criteria 1. Continue surveillance on a clinical basis.  # NOV 2023- incidentally- PET scan: Interval decrease and bilateral and symmetric increased uptake within the palatine tonsils; New mild increased radiotracer uptake within the pharyngeal tonsils is identified, left greater than right. S/p evaluation with Dr.Jeungle-negative for any recurrent malignancy.  # Anxiety/depression: On anxiolytic as per PCP.  Stable.  # [PET scan- [id number] liver compatible with cirrhosis Slightly intermittent elevated LFts-?  Fatty liver.  Hepatitis panel normal.- referral to GI- [place] re: cirrhosis  # Card" Hx of A.fib [Dr.Fath]- Stable 4 cm ascending thoracic aortic aneurysm. Recommend annual imaging followup by CTA or MRA.-defer to cardiology.  On lasix 40 mg/day- see below;   # Electrolyte- mild hypokalemia/ hypomagnesia [sec to Lasix]- recommend Kdur once a day.refilled.  continue magnesium supplementation. Stable.   # IV access: port flush   #Incidental findings on Imaging  CT , 2024: Aneurysm thoracic; atherosclerosis; cirrhosis I reviewed/discussed /counseled the patient.   # DISPOSITION: # referral to GI- [place] re: cirrhosis # follow up in 3 months- MD;  Cbc/cmp/ldh; port flush/ prior-  Dr.B  # I reviewed the blood work- with the patient in detail; also reviewed the imaging independently [as summarized above]; and with the patient in detail.    Orders Placed This Encounter  Procedures   Ambulatory referral to Gastroenterology    Referral Priority:   Routine    Referral Type:   Consultation    Referral Reason:   Specialty Services Required    Requested Specialty:   Gastroenterology    Number of Visits Requested:   1   All questions were answered. The patient knows to call the clinic with  any problems, questions or concerns.      Cammie Sickle, MD 02/11/2023 12:22 PM

## 2023-02-11 NOTE — Progress Notes (Signed)
Patient not taking Magnesium or Spiriva and not sure if she needs to take.    Occasional right side abdominal pain.

## 2023-02-20 ENCOUNTER — Other Ambulatory Visit: Payer: Self-pay | Admitting: Internal Medicine

## 2023-02-21 ENCOUNTER — Encounter: Payer: Self-pay | Admitting: Internal Medicine

## 2023-02-28 ENCOUNTER — Inpatient Hospital Stay: Payer: Medicare PPO

## 2023-02-28 ENCOUNTER — Telehealth: Payer: Self-pay | Admitting: *Deleted

## 2023-02-28 NOTE — Telephone Encounter (Signed)
Pt. Called and said that she wanted to get her labs for tom. Because she is already going to see Dr. Doy Hutching on Friday. When I looked at the appt for Dr. Rogue Bussing is jsut port flush. So I can switch it to 3/22. However the pt said she just had port flush 3/13. So we moved her flush appt to 4/18 1:30. Pt understands and will change her appt. To April.

## 2023-03-28 ENCOUNTER — Inpatient Hospital Stay: Payer: Medicare PPO | Attending: Internal Medicine

## 2023-03-28 DIAGNOSIS — Z452 Encounter for adjustment and management of vascular access device: Secondary | ICD-10-CM | POA: Insufficient documentation

## 2023-03-28 DIAGNOSIS — Z95828 Presence of other vascular implants and grafts: Secondary | ICD-10-CM

## 2023-03-28 DIAGNOSIS — C8331 Diffuse large B-cell lymphoma, lymph nodes of head, face, and neck: Secondary | ICD-10-CM | POA: Insufficient documentation

## 2023-03-28 DIAGNOSIS — Z85118 Personal history of other malignant neoplasm of bronchus and lung: Secondary | ICD-10-CM | POA: Insufficient documentation

## 2023-03-28 MED ORDER — HEPARIN SOD (PORK) LOCK FLUSH 100 UNIT/ML IV SOLN
500.0000 [IU] | Freq: Once | INTRAVENOUS | Status: AC
  Administered 2023-03-28: 500 [IU] via INTRAVENOUS
  Filled 2023-03-28: qty 5

## 2023-03-28 MED ORDER — SODIUM CHLORIDE 0.9% FLUSH
10.0000 mL | Freq: Once | INTRAVENOUS | Status: AC
  Administered 2023-03-28: 10 mL via INTRAVENOUS
  Filled 2023-03-28: qty 10

## 2023-05-20 ENCOUNTER — Telehealth: Payer: Self-pay | Admitting: Internal Medicine

## 2023-05-20 ENCOUNTER — Inpatient Hospital Stay: Payer: Medicare PPO | Attending: Internal Medicine

## 2023-05-20 ENCOUNTER — Encounter: Payer: Self-pay | Admitting: Internal Medicine

## 2023-05-20 ENCOUNTER — Inpatient Hospital Stay (HOSPITAL_BASED_OUTPATIENT_CLINIC_OR_DEPARTMENT_OTHER): Payer: Medicare PPO | Admitting: Internal Medicine

## 2023-05-20 VITALS — BP 107/81 | HR 86 | Temp 97.8°F | Ht 67.0 in | Wt 171.2 lb

## 2023-05-20 DIAGNOSIS — C8590 Non-Hodgkin lymphoma, unspecified, unspecified site: Secondary | ICD-10-CM

## 2023-05-20 DIAGNOSIS — Z800 Family history of malignant neoplasm of digestive organs: Secondary | ICD-10-CM | POA: Diagnosis not present

## 2023-05-20 DIAGNOSIS — C8331 Diffuse large B-cell lymphoma, lymph nodes of head, face, and neck: Secondary | ICD-10-CM | POA: Diagnosis present

## 2023-05-20 DIAGNOSIS — Z8042 Family history of malignant neoplasm of prostate: Secondary | ICD-10-CM | POA: Insufficient documentation

## 2023-05-20 DIAGNOSIS — I4891 Unspecified atrial fibrillation: Secondary | ICD-10-CM | POA: Insufficient documentation

## 2023-05-20 DIAGNOSIS — R300 Dysuria: Secondary | ICD-10-CM

## 2023-05-20 DIAGNOSIS — N390 Urinary tract infection, site not specified: Secondary | ICD-10-CM | POA: Insufficient documentation

## 2023-05-20 DIAGNOSIS — Z79899 Other long term (current) drug therapy: Secondary | ICD-10-CM | POA: Diagnosis not present

## 2023-05-20 DIAGNOSIS — R251 Tremor, unspecified: Secondary | ICD-10-CM | POA: Insufficient documentation

## 2023-05-20 DIAGNOSIS — Z452 Encounter for adjustment and management of vascular access device: Secondary | ICD-10-CM | POA: Insufficient documentation

## 2023-05-20 DIAGNOSIS — K7469 Other cirrhosis of liver: Secondary | ICD-10-CM

## 2023-05-20 DIAGNOSIS — F419 Anxiety disorder, unspecified: Secondary | ICD-10-CM | POA: Diagnosis not present

## 2023-05-20 DIAGNOSIS — R682 Dry mouth, unspecified: Secondary | ICD-10-CM | POA: Insufficient documentation

## 2023-05-20 DIAGNOSIS — Z923 Personal history of irradiation: Secondary | ICD-10-CM | POA: Insufficient documentation

## 2023-05-20 DIAGNOSIS — Z95828 Presence of other vascular implants and grafts: Secondary | ICD-10-CM

## 2023-05-20 DIAGNOSIS — Z85118 Personal history of other malignant neoplasm of bronchus and lung: Secondary | ICD-10-CM | POA: Diagnosis present

## 2023-05-20 LAB — URINALYSIS, COMPLETE (UACMP) WITH MICROSCOPIC
Bilirubin Urine: NEGATIVE
Glucose, UA: NEGATIVE mg/dL
Ketones, ur: NEGATIVE mg/dL
Leukocytes,Ua: NEGATIVE
Nitrite: NEGATIVE
Protein, ur: NEGATIVE mg/dL
Specific Gravity, Urine: 1.012 (ref 1.005–1.030)
pH: 6 (ref 5.0–8.0)

## 2023-05-20 LAB — CBC WITH DIFFERENTIAL (CANCER CENTER ONLY)
Abs Immature Granulocytes: 0.02 10*3/uL (ref 0.00–0.07)
Basophils Absolute: 0.1 10*3/uL (ref 0.0–0.1)
Basophils Relative: 1 %
Eosinophils Absolute: 0.1 10*3/uL (ref 0.0–0.5)
Eosinophils Relative: 2 %
HCT: 34.4 % — ABNORMAL LOW (ref 36.0–46.0)
Hemoglobin: 12 g/dL (ref 12.0–15.0)
Immature Granulocytes: 0 %
Lymphocytes Relative: 15 %
Lymphs Abs: 0.8 10*3/uL (ref 0.7–4.0)
MCH: 32.8 pg (ref 26.0–34.0)
MCHC: 34.9 g/dL (ref 30.0–36.0)
MCV: 94 fL (ref 80.0–100.0)
Monocytes Absolute: 0.3 10*3/uL (ref 0.1–1.0)
Monocytes Relative: 6 %
Neutro Abs: 3.9 10*3/uL (ref 1.7–7.7)
Neutrophils Relative %: 76 %
Platelet Count: 179 10*3/uL (ref 150–400)
RBC: 3.66 MIL/uL — ABNORMAL LOW (ref 3.87–5.11)
RDW: 13.2 % (ref 11.5–15.5)
WBC Count: 5.2 10*3/uL (ref 4.0–10.5)
nRBC: 0 % (ref 0.0–0.2)

## 2023-05-20 LAB — CMP (CANCER CENTER ONLY)
ALT: 29 U/L (ref 0–44)
AST: 38 U/L (ref 15–41)
Albumin: 3.7 g/dL (ref 3.5–5.0)
Alkaline Phosphatase: 108 U/L (ref 38–126)
Anion gap: 7 (ref 5–15)
BUN: 11 mg/dL (ref 8–23)
CO2: 25 mmol/L (ref 22–32)
Calcium: 8.5 mg/dL — ABNORMAL LOW (ref 8.9–10.3)
Chloride: 104 mmol/L (ref 98–111)
Creatinine: 0.76 mg/dL (ref 0.44–1.00)
GFR, Estimated: >60 mL/min (ref >60)
Glucose, Bld: 105 mg/dL — ABNORMAL HIGH (ref 70–99)
Potassium: 3.3 mmol/L — ABNORMAL LOW (ref 3.5–5.1)
Sodium: 136 mmol/L (ref 135–145)
Total Bilirubin: 0.6 mg/dL (ref 0.3–1.2)
Total Protein: 7 g/dL (ref 6.5–8.1)

## 2023-05-20 LAB — LACTATE DEHYDROGENASE: LDH: 130 U/L (ref 98–192)

## 2023-05-20 MED ORDER — LIDOCAINE-PRILOCAINE 2.5-2.5 % EX CREA: TOPICAL_CREAM | CUTANEOUS | 3 refills | Status: DC

## 2023-05-20 MED ORDER — HEPARIN SOD (PORK) LOCK FLUSH 100 UNIT/ML IV SOLN
500.0000 [IU] | Freq: Once | INTRAVENOUS | Status: AC
  Administered 2023-05-20: 500 [IU] via INTRAVENOUS
  Filled 2023-05-20: qty 5

## 2023-05-20 MED ORDER — SODIUM CHLORIDE 0.9% FLUSH
10.0000 mL | Freq: Once | INTRAVENOUS | Status: AC
  Administered 2023-05-20: 10 mL via INTRAVENOUS
  Filled 2023-05-20: qty 10

## 2023-05-20 NOTE — Telephone Encounter (Signed)
Julie Jennings/Julie Jennings- Please inform pt that repeat UA-"blood"; this could be from recent bladder infection. But no signs of any active infection.  Inform patient that she does not need any antibiotics at this time. Awaiting cultures.    Recommend follow-up with PCP-for further recommendations/follow-up of after urine testing.  Thanks GB

## 2023-05-20 NOTE — Assessment & Plan Note (Addendum)
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  Clinically stage II. Currently s/p R-CEOP chemotherapy every 3 weeks x 3 cycles-followed by involved field radiation.  Patient currently status post radiation-finished radiation on sep 7th. FEB 22nd, 2024- Deauville criteria 1;  No signs of residual or recurrent tumor within the chest, abdomen or pelvis.1. No signs of residual or recurrent FDG avid tumor. Deauville criteria 1. Continue surveillance on a clinical basis. Stable.   # NOV 2023- incidentally- PET scan: Interval decrease and bilateral and symmetric increased uptake within the palatine tonsils; New mild increased radiotracer uptake within the pharyngeal tonsils is identified, left greater than right. S/p evaluation with Dr.Jeungle-negative for any recurrent malignancy. Stable.   # Anxiety/depression: On anxiolytic as per PCP.will not refill xanax/defer to PCP-   Stable.  # [PET scan- 2024] liver compatible with cirrhosis Slightly intermittent elevated LFts-?  Fatty liver.  Hepatitis panel normal.- referral to GI- [place] re: cirrhosis-   # Card" Hx of A.fib [Dr.Fath]- Stable 4 cm ascending thoracic aortic aneurysm. Recommend annual imaging followup by CTA or MRA.-defer to cardiology.  On lasix 40 mg/day- see below;  Stable; defer to cardiology.   # Electrolyte- mild hypokalemia/ hypomagnesia [sec to Lasix]- continue/recommend compliance Kdur once a day.refilled.  come off magnesium supplementation. Stable.   # Recent UTI s/p antibiotics-symptoms improved repeat UA-"blood"; but no signs of infection.  Awaiting cultures.  Informed patient no antibiotics at this time.  Recommend follow-up with PCP  # IV access: port flush -  Stable.   # DISPOSITION: # referral to GI- [place] re: cirrhosis- please refer again-  # follow up in 3 months- MD;  Cbc/cmp/ldh;Mag;  port flush/ prior-  Dr.B

## 2023-05-20 NOTE — Progress Notes (Signed)
On ceftin for uti given by Dr. Carola Frost.  She would like to have refill of meloxicam(I didn't see on her list) and some other meds, that Dr. Judithann Sheen gives her.

## 2023-05-20 NOTE — Progress Notes (Signed)
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 Biopsy is suggestive of fatty liver changes  # carcinoma of lung status post left upper lobe resection in July of 2014; Adenocarcinoma T1N0 M0 tumor EGFR positive  SURGICAL PATHOLOGY  CASE: [id number]  PATIENT: Julie Jennings  Surgical Pathology Report      Specimen Submitted:  A. Lymph node, left neck   Clinical History: New left cervical lymphadenopathy.  History of  lymphoma and lung cancer.  New lymphadenopathy    MAY 17th, 2023- [Dr.juengle]; A. LYMPH NODE, LEFT NECK; ULTRASOUND-GUIDED BIOPSY:  - FINDINGS COMPATIBLE WITH LARGE B-CELL LYMPHOMA.   Comment:  Core biopsy sections display lymphoid tissue with a somewhat effaced  immunoarchitecture.  Portions of nodal tissue are comprised of sheets of  small lymphocytes, while other areas display dense background fibrosis,  and aggregates of large abnormal lymphocytes with irregular nuclear  contours, open chromatin, visible nucleoli, and retraction artifact.   Immunohistochemical studies demonstrate diffuse positivity for CD20  within regions comprised of larger lymphocytes, compatible with B cells.  CD3 highlights a background of small T cells.  CK AE1/AE3 is negative  for metastatic carcinoma.  PAX5 displays dim, patchy marking of larger  abnormal B cells.  In addition, these B cells appear to display dim  expression of CD10, with  positivity for Bcl-2, BCL6, and Mum-1.  B cells  are negative for CD5.  CD30 displays increased marking.  C-Myc is  positive, staining greater than 40% of larger cells.  CD45 (LCA) is  diffusely positive, without aberrant loss of expression.   Concurrent flow cytometric studies demonstrate a CD10 positive  monoclonal B-cell population in a background of many polytypic B cells,  representing 3% of total viable lymphoid cells and 8% of B cells.  For  further details, see scanned report in [facility].   The patient's history is of diffuse large B-cell lymphoma, as well as  invasive adenocarcinoma of the lung are noted.  Biopsy sections  demonstrate an abnormal proliferation of large B cells, compatible with  involvement by a diffuse large B-cell lymphoma. Further  subclassification is difficult, secondary to limited tissue, however,  presence of C10 marking would suggest a germinal center immunophenotype.  In addition, there does appear to be expression of both Bcl-2 and c-myc,  which may suggest a more aggressive process. There is no evidence of  metastatic adenocarcinoma. FISH testing for prognostically significant  abnormalities of BCL2, BCL6, and MYC will be attempted, and reported as  an addendum.   IMPRESSION: 1. Enlarged hypermetabolic lymph nodes in the bilateral left greater than right neck and asymmetric right palatine tonsil hypermetabolism with associated soft tissue fullness on the CT images, all new since 2014 PET-CT, most compatible with recurrent lymphoma. Deauville category 5.  # MAY 12th, 2023- DLBCL- STAGE II [NO Bone marrow]; GCB; double expresser-mcy; Bcl-2; QNS-FISH.   # MAY 30th, 2023- RCEOP q 3 W x3-RT [finished radiation-sep 7th, 2023]   Cancer of upper lobe of  left lung (HCC)  Diffuse large B-cell lymphoma of lymph nodes of neck (HCC)  04/25/2022 Initial Diagnosis   Diffuse large B-cell lymphoma of lymph nodes of neck (HCC)   04/26/2022 Cancer Staging   Staging  form: Hodgkin and Non-Hodgkin Lymphoma, AJCC 8th Edition - Clinical: Stage II - Signed by Earna Coder, MD on 04/26/2022 Histopathologic type: Malignant lymphoma, large B-cell, diffuse, NOS   05/08/2022 - 06/22/2022 Chemotherapy   [name]      INTERVAL HISTORY: Patient is alone.   She is ambulating independently.  Julie Jennings 72 y.o.  female pleasant patient with extreme anxiety; and diffuse large B cell lymphoma [prior history of 2005 DLCBL] - status post R-CEOP cycle #3; also followed by radiation finished approximately  7 months ago is here for a follow up.   Patient thinks she has a UTI.  On ceftin for uti given by Dr. Marcello Fennel.    She would like to have refill of meloxicam(I didn't see on her list) and some other meds.   Chronic mild tremor.  Chronic dry mouth.   No weight loss. Patient denies any worsening enlargement of her lymph nodes.    Review of Systems  Constitutional:  Negative for chills, diaphoresis, fever, malaise/fatigue and weight loss.  HENT:  Negative for nosebleeds and sore throat.   Eyes:  Negative for double vision.  Respiratory:  Negative for cough, hemoptysis, sputum production, shortness of breath and wheezing.   Cardiovascular:  Negative for chest pain, palpitations, orthopnea and leg swelling.  Gastrointestinal:  Negative for abdominal pain, blood in stool, constipation, diarrhea, heartburn, melena, nausea and vomiting.  Musculoskeletal:  Positive for joint pain. Negative for back pain.  Skin: Negative.  Negative for itching and rash.  Neurological:  Negative for dizziness, tingling, focal weakness, weakness and headaches.  Endo/Heme/Allergies:  Does not bruise/bleed easily.  Psychiatric/Behavioral:  Negative for depression. The patient is nervous/anxious. The patient does not have insomnia.      PAST MEDICAL HISTORY :  Past Medical History:  Diagnosis Date   A-fib (HCC)    Only  once   Anxiety    Arthritis    oesteoarthritis   BP (high blood pressure) 04/16/2014   Chronic kidney disease    history nephrolithiasis   Depression    Dysrhythmia    PSVT   Endometriosis    Herpes zoster    History of kidney stones    Lung cancer (HCC) left   Lymphoma (HCC)    "stomach"   Stroke (HCC)     PAST SURGICAL HISTORY :   Past Surgical History:  Procedure Laterality Date   AUGMENTATION MAMMAPLASTY Bilateral    CHOLECYSTECTOMY     COLONOSCOPY     COLONOSCOPY WITH PROPOFOL N/A 04/14/2018   Procedure: COLONOSCOPY WITH PROPOFOL;  Surgeon: Scot Jun, MD;  Location[facility] ENDOSCOPY;  Service: Endoscopy;  Laterality: N/A;   DILATION AND CURETTAGE OF UTERUS     HEMORRHOIDECTOMY WITH HEMORRHOID BANDING     HERNIA REPAIR     umbilical hernia   IR IMAGING GUIDED PORT INSERTION  05/04/2022   LUNG REMOVAL, PARTIAL Left    REVERSE SHOULDER ARTHROPLASTY Left 01/04/2022   Procedure: Left reverse shoulder arthroplasty, biceps tenodesis;  Surgeon: Signa Kell, MD;  Location: [facility] ORS;  Service: Orthopedics;  Laterality: Left;    FAMILY HISTORY :   Family History  Problem Relation Age of Onset   Stroke Mother  Diabetes Mother    Colon cancer Father    Prostate cancer Father    Breast cancer Neg Hx     SOCIAL HISTORY:   Social History   Tobacco Use   Smoking status: Never   Smokeless tobacco: Never  Vaping Use   Vaping Use: Never used  Substance Use Topics   Alcohol use: No   Drug use: No    ALLERGIES:  is allergic to tylenol [acetaminophen], buspirone, and codeine.  MEDICATIONS:  Current Outpatient Medications  Medication Sig Dispense Refill   ALPRAZolam (XANAX) 0.5 MG tablet Take 1 tablet (0.5 mg total) by mouth 3 (three) times daily. 5 tablet 0   ARIPiprazole (ABILIFY) 2 MG tablet Take 2 mg by mouth daily.     aspirin 81 MG chewable tablet Chew by mouth.     losartan (COZAAR) 100 MG tablet Take 100 mg by mouth daily.     nebivolol (BYSTOLIC) 10  MG tablet Take 10 mg by mouth daily.     Polyethyl Glycol-Propyl Glycol (SYSTANE) 0.4-0.3 % SOLN Place 1 drop into both eyes daily as needed (Dry eye).     potassium chloride SA (KLOR-CON M) 20 MEQ tablet One pill a day. 30 tablet 3   furosemide (LASIX) 40 MG tablet Take 40 mg by mouth daily.     lidocaine-prilocaine (EMLA) cream Apply on the port. 30 -45 min  prior to port access. 30 g 3   ondansetron (ZOFRAN) 8 MG tablet Every 8 hours as needed for nausea/vomitting. (Patient not taking: Reported on 11/06/2022) 40 tablet 2   pantoprazole (PROTONIX) 40 MG tablet Take 40 mg by mouth 2 (two) times daily.     prochlorperazine (COMPAZINE) 10 MG tablet Take 1 tablet (10 mg total) by mouth every 6 (six) hours as needed for nausea or vomiting. (Patient not taking: Reported on 11/06/2022) 40 tablet 0   QUEtiapine (SEROQUEL) 25 MG tablet Take 1 tablet (25 mg total) by mouth at bedtime. 90 tablet 0   tiotropium (SPIRIVA) 18 MCG inhalation capsule Place 18 mcg into inhaler and inhale daily. (Patient not taking: Reported on 11/06/2022)     triamcinolone (NASACORT) 55 MCG/ACT AERO nasal inhaler Place 2 sprays into the nose daily. (Patient not taking: Reported on 11/06/2022)     venlafaxine XR (EFFEXOR-XR) 75 MG 24 hr capsule Take 3 capsules (225 mg total) by mouth daily with breakfast. (Patient not taking: Reported on 05/20/2023) 90 capsule 1   No current facility-administered medications for this visit.   Facility-Administered Medications Ordered in Other Visits  Medication Dose Route Frequency Provider Last Rate Last Admin   sodium chloride flush (NS) 0.9 % injection 10 mL  10 mL Intravenous PRN Earna Coder, MD   10 mL at 01/22/23 1452    PHYSICAL EXAMINATION: ECOG PERFORMANCE STATUS: 0 - Asymptomatic  BP 107/81 (BP Location: Right Arm, Patient Position: Sitting, Cuff Size: Normal)   Pulse 86   Temp 97.8 F (36.6 C) (Tympanic)   Ht 5\' 7"  (1.702 m)   Wt 171 lb 3.2 oz (77.7 kg)   SpO2 100%    BMI 26.81 kg/m   Filed Weights   05/20/23 1258  Weight: 171 lb 3.2 oz (77.7 kg)       Left neck lymphadenopathy 2 to 3 cm in size noted.  Nontender.  Physical Exam HENT:     Head: Normocephalic and atraumatic.     Mouth/Throat:     Pharynx: No oropharyngeal exudate.  Eyes:  Pupils: Pupils are equal, round, and reactive to light.  Cardiovascular:     Rate and Rhythm: Normal rate and regular rhythm.  Pulmonary:     Effort: Pulmonary effort is normal. No respiratory distress.     Breath sounds: Normal breath sounds. No wheezing.  Abdominal:     General: Bowel sounds are normal. There is no distension.     Palpations: Abdomen is soft. There is no mass.     Tenderness: There is no abdominal tenderness. There is no guarding or rebound.  Musculoskeletal:        General: No tenderness. Normal range of motion.     Cervical back: Normal range of motion and neck supple.  Skin:    General: Skin is warm.  Neurological:     Mental Status: She is alert and oriented to person, place, and time.  Psychiatric:        Mood and Affect: Affect normal.      LABORATORY DATA:  I have reviewed the data as listed    Component Value Date/Time   NA 136 05/20/2023 1303   NA 139 03/14/2015 0856   K 3.3 (L) 05/20/2023 1303   K 4.1 03/14/2015 0856   CL 104 05/20/2023 1303   CL 103 03/14/2015 0856   CO2 25 05/20/2023 1303   CO2 28 03/14/2015 0856   GLUCOSE 105 (H) 05/20/2023 1303   GLUCOSE 111 (H) 03/14/2015 0856   BUN 11 05/20/2023 1303   BUN 19 03/14/2015 0856   CREATININE 0.76 05/20/2023 1303   CREATININE 0.56 03/14/2015 0856   CALCIUM 8.5 (L) 05/20/2023 1303   CALCIUM 9.3 03/14/2015 0856   PROT 7.0 05/20/2023 1303   PROT 7.7 03/14/2015 0856   ALBUMIN 3.7 05/20/2023 1303   ALBUMIN 4.3 03/14/2015 0856   AST 38 05/20/2023 1303   ALT 29 05/20/2023 1303   ALT 87 (H) 03/14/2015 0856   ALKPHOS 108 05/20/2023 1303   ALKPHOS 164 (H) 03/14/2015 0856   BILITOT 0.6 05/20/2023 1303    GFRNONAA >60 05/20/2023 1303   GFRNONAA >60 03/14/2015 0856   GFRAA >60 07/07/2020 1041   GFRAA >60 03/14/2015 0856    No results found for: "SPEP", "UPEP"  Lab Results  Component Value Date   WBC 5.2 05/20/2023   NEUTROABS 3.9 05/20/2023   HGB 12.0 05/20/2023   HCT 34.4 (L) 05/20/2023   MCV 94.0 05/20/2023   PLT 179 05/20/2023      Chemistry      Component Value Date/Time   NA 136 05/20/2023 1303   NA 139 03/14/2015 0856   K 3.3 (L) 05/20/2023 1303   K 4.1 03/14/2015 0856   CL 104 05/20/2023 1303   CL 103 03/14/2015 0856   CO2 25 05/20/2023 1303   CO2 28 03/14/2015 0856   BUN 11 05/20/2023 1303   BUN 19 03/14/2015 0856   CREATININE 0.76 05/20/2023 1303   CREATININE 0.56 03/14/2015 0856      Component Value Date/Time   CALCIUM 8.5 (L) 05/20/2023 1303   CALCIUM 9.3 03/14/2015 0856   ALKPHOS 108 05/20/2023 1303   ALKPHOS 164 (H) 03/14/2015 0856   AST 38 05/20/2023 1303   ALT 29 05/20/2023 1303   ALT 87 (H) 03/14/2015 0856   BILITOT 0.6 05/20/2023 1303       RADIOGRAPHIC STUDIES: I have personally reviewed the radiological images as listed and agreed with the findings in the report. No results found.   ASSESSMENT & PLAN:  Diffuse large B-cell lymphoma  of lymph nodes of neck (HCC) #Diffuse B-cell lymphoma-bilateral neck left more than right PET scan.  Clinically stage II. Currently s/p R-CEOP chemotherapy every 3 weeks x 3 cycles-followed by involved field radiation.  Patient currently status post radiation-finished radiation on sep 7th. FEB 22nd, 2024- Deauville criteria 1;  No signs of residual or recurrent tumor within the chest, abdomen or pelvis.1. No signs of residual or recurrent FDG avid tumor. Deauville criteria 1. Continue surveillance on a clinical basis. Stable.   # NOV 2023- incidentally- PET scan: Interval decrease and bilateral and symmetric increased uptake within the palatine tonsils; New mild increased radiotracer uptake within the pharyngeal  tonsils is identified, left greater than right. S/p evaluation with Dr.Jeungle-negative for any recurrent malignancy. Stable.   # Anxiety/depression: On anxiolytic as per PCP.will not refill xanax/defer to PCP-   Stable.  # [PET scan- 2024] liver compatible with cirrhosis Slightly intermittent elevated LFts-?  Fatty liver.  Hepatitis panel normal.- referral to GI- [facility] re: cirrhosis-   # Card" Hx of A.fib [Dr.Fath]- Stable 4 cm ascending thoracic aortic aneurysm. Recommend annual imaging followup by CTA or MRA.-defer to cardiology.  On lasix 40 mg/day- see below;  Stable; defer to cardiology.   # Electrolyte- mild hypokalemia/ hypomagnesia [sec to Lasix]- continue/recommend compliance Kdur once a day.refilled.  come off magnesium supplementation. Stable.   # Recent UTI s/p antibiotics-symptoms improved repeat UA-"blood"; but no signs of infection.  Awaiting cultures.  Informed patient no antibiotics at this time.  Recommend follow-up with PCP  # IV access: port flush -  Stable.   # DISPOSITION: # referral to GI- [facility] re: cirrhosis- please refer again-  # follow up in 3 months- MD;  Cbc/cmp/ldh;Mag;  port flush/ prior-  Dr.B   Orders Placed This Encounter  Procedures   Urine Culture    Standing Status:   Future    Number of Occurrences:   1    Standing Expiration Date:   05/19/2024   Urinalysis, Complete w Microscopic    Standing Status:   Future    Number of Occurrences:   1    Standing Expiration Date:   05/19/2024   CBC with Differential (Cancer Center Only)    Standing Status:   Future    Standing Expiration Date:   05/19/2024   CMP (Cancer Center only)    Standing Status:   Future    Standing Expiration Date:   05/19/2024   Lactate dehydrogenase    Standing Status:   Future    Standing Expiration Date:   05/19/2024   Magnesium    Standing Status:   Future    Standing Expiration Date:   05/19/2024   Ambulatory referral to Gastroenterology    Referral Priority:   Routine     Referral Type:   Consultation    Referral Reason:   Specialty Services Required    Requested Specialty:   Gastroenterology    Number of Visits Requested:   1   All questions were answered. The patient knows to call the clinic with any problems, questions or concerns.      Earna Coder, MD 05/20/2023 2:24 PM

## 2023-05-21 LAB — URINE CULTURE: Culture: <10000 — AB

## 2023-07-03 ENCOUNTER — Other Ambulatory Visit: Payer: Self-pay | Admitting: Gastroenterology

## 2023-07-03 DIAGNOSIS — K760 Fatty (change of) liver, not elsewhere classified: Secondary | ICD-10-CM

## 2023-07-16 ENCOUNTER — Other Ambulatory Visit: Payer: Medicare PPO

## 2023-07-18 ENCOUNTER — Ambulatory Visit: Payer: Medicare PPO

## 2023-07-23 ENCOUNTER — Other Ambulatory Visit: Payer: Medicare PPO

## 2023-07-30 ENCOUNTER — Ambulatory Visit
Admission: RE | Admit: 2023-07-30 | Discharge: 2023-07-30 | Disposition: A | Discharge status: Home / Self Care | Payer: Medicare PPO | Source: Ambulatory Visit | Attending: Gastroenterology | Admitting: Gastroenterology

## 2023-07-30 DIAGNOSIS — K760 Fatty (change of) liver, not elsewhere classified: Secondary | ICD-10-CM | POA: Insufficient documentation

## 2023-08-09 ENCOUNTER — Telehealth: Payer: Self-pay | Admitting: *Deleted

## 2023-08-09 ENCOUNTER — Telehealth: Payer: Self-pay | Admitting: Internal Medicine

## 2023-08-09 NOTE — Telephone Encounter (Signed)
With regards to patient's antibiotics prior to dental workup-I will defer to dentistry.  Julie Jennings and phone number is [phone]   From oncology standpoint patient does not need any antibiotics prior to dental workup.  Her recent labs are normal limits.  I left a message for the dentist office to call us back to discuss further.   GB

## 2023-08-09 NOTE — Telephone Encounter (Signed)
She called me yesterday evening and she wants to get the teeth cleaning and she spoke to the staff at dentist and she was told because of the shoulder surgery and chemo. The staff says she needs atb before the teeth cleaning. Dr. Donneta Romberg asked me to give him the telephone to speak with dentist. I gave him the number [phone] dr Tawanna Cooler Joesphine Bare.

## 2023-08-20 ENCOUNTER — Inpatient Hospital Stay: Payer: Medicare PPO | Admitting: Internal Medicine

## 2023-08-20 ENCOUNTER — Inpatient Hospital Stay: Payer: Medicare PPO

## 2023-08-20 ENCOUNTER — Inpatient Hospital Stay: Payer: Medicare PPO | Admitting: Nurse Practitioner

## 2023-08-29 ENCOUNTER — Encounter: Payer: Self-pay | Admitting: *Deleted

## 2023-08-29 ENCOUNTER — Telehealth: Payer: Self-pay | Admitting: *Deleted

## 2023-08-29 ENCOUNTER — Other Ambulatory Visit: Payer: Self-pay | Admitting: *Deleted

## 2023-08-29 DIAGNOSIS — C8331 Diffuse large B-cell lymphoma, lymph nodes of head, face, and neck: Secondary | ICD-10-CM

## 2023-08-29 NOTE — Telephone Encounter (Signed)
She called yest. About wanting her to get her port out. She hit it and it is sore and she wants it out.  Dr. Donneta Romberg is ok with that. I filled out the paper for the IR to take it out. The [facility] team will get the date and time when Julie Jennings tells them and they will call pt. With data and time. Pt ok with this

## 2023-09-02 ENCOUNTER — Ambulatory Visit (INDEPENDENT_AMBULATORY_CARE_PROVIDER_SITE_OTHER): Payer: Medicare PPO

## 2023-09-02 ENCOUNTER — Encounter: Payer: Self-pay | Admitting: Emergency Medicine

## 2023-09-02 ENCOUNTER — Ambulatory Visit
Admission: EM | Admit: 2023-09-02 | Discharge: 2023-09-02 | Disposition: A | Discharge status: Home / Self Care | Payer: Medicare PPO | Attending: Emergency Medicine | Admitting: Emergency Medicine

## 2023-09-02 ENCOUNTER — Other Ambulatory Visit: Payer: Medicare PPO

## 2023-09-02 DIAGNOSIS — J069 Acute upper respiratory infection, unspecified: Secondary | ICD-10-CM

## 2023-09-02 MED ORDER — AMOXICILLIN-POT CLAVULANATE 875-125 MG PO TABS: 1.0000 | ORAL_TABLET | Freq: Two times a day (BID) | ORAL | 0 refills | Status: DC

## 2023-09-02 NOTE — Discharge Instructions (Addendum)
Today you were evaluated for your persistent cough  On exam lungs are clear and you are getting enough air without assistance, persistent cough is most likely due to irritation to the airway from recent viral illness of COVID  Chest x-ray is pending, will most likely result overnight therefore you will be called with results via telephone sometime tomorrow morning  As your coughing has been persisting for 2 weeks we will prophylactically place you on antibiotic to provide coverage for bacteria  Begin Augmentin every morning and every evening for 7 days  Attempted use of over-the-counter Delsym to further assist with your coughing    Ensure that you are drinking adequate amounts of fluid as this will help keep any mucus thin making it easier to expel  Ensure that bedroom is not warm dry or stuffy as it is more difficult to breathe and hot air, cool off the room using a fan or crack in the window or turn down the temperature in your home  If you have a humidifier you may use at nighttime, if no humidifier you may steam up the bathroom and sit inside for 10 to 15 minutes prior to bed  For any further concerns you may follow-up with his urgent care or your primary doctor for reevaluation

## 2023-09-02 NOTE — ED Triage Notes (Signed)
Pt was diagnosed with covid 2 weeks ago and continues to have a cough.

## 2023-09-03 ENCOUNTER — Telehealth: Payer: Self-pay | Admitting: Emergency Medicine

## 2023-09-03 NOTE — ED Provider Notes (Signed)
Renaldo Fiddler    CSN: [id number] Arrival date & time: 09/02/23  1908      History   Chief Complaint Chief Complaint  Patient presents with   Cough    HPI Julie Jennings is a 72 y.o. female.   Patient presents for evaluation of a persistent cough beginning approximately 5 days ago after COVID-19 viral infection, diagnosed 2 weeks ago.  Still experiencing mild nasal congestion but endorses improvement.  Cough is nonproductive.  Denies shortness of breath or wheezing.  Was prescribed benzonatate by primary doctor which she endorses is ineffective.  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   Proximal humerus fracture 01/04/2022   SOB (shortness of breath) 05/19/2020   Osteopenia of multiple sites 09/09/2018   Post-menopausal bleeding 07/31/2018   History of screening mammography 09/28/2016   Allergic rhinitis 09/13/2015   Cerebral infarction (HCC) 09/13/2015   Clinical depression 09/13/2015   Endometriosis 09/13/2015   Fatty infiltration of liver 09/13/2015   Adaptive colitis 09/13/2015   Menopause 09/13/2015   Calculus of kidney 09/13/2015   Arthritis, degenerative 09/13/2015   Awareness of heartbeats 09/13/2015   Paroxysmal supraventricular tachycardia 09/13/2015   Abnormal LFTs 04/30/2014   Accumulation of fluid in tissues 04/30/2014   Edema 04/30/2014   Anxiety 04/16/2014   Personal history of other diseases of the circulatory system 04/16/2014   BP  (high blood pressure) 04/16/2014   HLD (hyperlipidemia) 04/16/2014   Lymphoma (HCC) 04/16/2014   Cancer of upper lobe of left lung (HCC) 06/28/2013    Past Surgical History:  Procedure Laterality Date   AUGMENTATION MAMMAPLASTY Bilateral    CHOLECYSTECTOMY     COLONOSCOPY     COLONOSCOPY WITH PROPOFOL N/A 04/14/2018   Procedure: COLONOSCOPY WITH PROPOFOL;  Surgeon: Scot Jun, MD;  Location: [facility] ENDOSCOPY;  Service: Endoscopy;  Laterality: N/A;   DILATION AND CURETTAGE OF UTERUS     HEMORRHOIDECTOMY WITH HEMORRHOID BANDING     HERNIA REPAIR     umbilical hernia   IR IMAGING GUIDED PORT INSERTION  05/04/2022   LUNG REMOVAL, PARTIAL Left    REVERSE SHOULDER ARTHROPLASTY Left 01/04/2022   Procedure: Left reverse shoulder arthroplasty, biceps tenodesis;  Surgeon: Signa Kell, MD;  Location: [facility] ORS;  Service: Orthopedics;  Laterality: Left;    OB History     Gravida  0   Para  0   Term  0   Preterm  0   AB  0   Living  0      SAB  0   IAB  0   Ectopic  0   Multiple  0   Live Births  0            Home Medications    Prior to Admission medications   Medication Sig Start Date End Date Taking? Authorizing  Provider  amoxicillin-clavulanate (AUGMENTIN) 875-125 MG tablet Take 1 tablet by mouth every 12 (twelve) hours. 09/02/23  Yes Delvin Hedeen, Elita Boone, NP  ALPRAZolam (XANAX) 0.5 MG tablet Take 1 tablet (0.5 mg total) by mouth 3 (three) times daily. 11/22/22   Earna Coder, MD  ARIPiprazole (ABILIFY) 2 MG tablet Take 2 mg by mouth daily. 11/23/22   [provider]  aspirin 81 MG chewable tablet Chew by mouth.    [provider]  furosemide (LASIX) 40 MG tablet Take 40 mg by mouth daily. 09/30/14 02/11/23  [provider]  lidocaine-prilocaine (EMLA) cream Apply on the port. 30 -45 min  prior to port access. 05/20/23   Earna Coder, MD  losartan (COZAAR) 100 MG tablet Take 100 mg by mouth daily. 06/24/20   [provider]  nebivolol (BYSTOLIC) 10 MG tablet Take 10 mg by mouth daily. 09/30/14   [provider]  ondansetron (ZOFRAN) 8 MG tablet Every 8 hours as needed for nausea/vomitting. Patient not taking: Reported on 11/06/2022 06/18/22   Earna Coder, MD  pantoprazole (PROTONIX) 40 MG tablet Take 40 mg by mouth 2 (two) times daily. 04/22/17 02/11/23  [provider]  Polyethyl Glycol-Propyl Glycol (SYSTANE) 0.4-0.3 % SOLN Place 1 drop into both eyes daily as needed (Dry eye).    [provider]  potassium chloride SA (KLOR-CON M) 20 MEQ tablet One pill a day. 02/11/23   Earna Coder, MD  prochlorperazine (COMPAZINE) 10 MG tablet Take 1 tablet (10 mg total) by mouth every 6 (six) hours as needed for nausea or vomiting. Patient not taking: Reported on 11/06/2022 10/14/22   Creig Hines, MD  QUEtiapine (SEROQUEL) 25 MG tablet Take 1 tablet (25 mg total) by mouth at bedtime. 07/26/22 02/11/23  Neysa Hotter, MD  tiotropium (SPIRIVA) 18 MCG inhalation capsule Place 18 mcg into inhaler and inhale daily. Patient not taking: Reported on 11/06/2022 09/30/14   [provider]  triamcinolone (NASACORT) 55 MCG/ACT AERO nasal inhaler Place 2 sprays into the nose daily. Patient not taking: Reported on 11/06/2022    [provider]  venlafaxine XR (EFFEXOR-XR) 75 MG 24 hr capsule Take 3 capsules (225 mg total) by mouth daily with breakfast. Patient not taking: Reported on 05/20/2023 06/26/22   Borders, Daryl Eastern, NP    Family History Family History  Problem Relation Age of Onset   Stroke Mother    Diabetes Mother    Colon cancer Father    Prostate cancer Father    Breast cancer Neg Hx     Social History Social History   Tobacco Use   Smoking status: Never   Smokeless tobacco: Never  Vaping Use   Vaping status: Never Used  Substance Use Topics   Alcohol use: No   Drug use: No     Allergies   Tylenol [acetaminophen], Buspirone, and  Codeine   Review of Systems Review of Systems  Respiratory:  Positive for cough.      Physical Exam Triage Vital Signs ED Triage Vitals [09/02/23 1927]  Encounter Vitals Group     BP 125/82     Systolic BP Percentile      Diastolic BP Percentile      Pulse Rate 85     Resp 18     Temp 98.9 F (37.2 C)     Temp Source Oral     SpO2 98 %     Weight      Height  Head Circumference      Peak Flow      Pain Score      Pain Loc      Pain Education      Exclude from Growth Chart    No data found.  Updated Vital Signs BP 125/82 (BP Location: Right Arm)   Pulse 85   Temp 98.9 F (37.2 C) (Oral)   Resp 18   SpO2 98%   Visual Acuity Right Eye Distance:   Left Eye Distance:   Bilateral Distance:    Right Eye Near:   Left Eye Near:    Bilateral Near:     Physical Exam Constitutional:      Appearance: Normal appearance.  Eyes:     Extraocular Movements: Extraocular movements intact.  Cardiovascular:     Rate and Rhythm: Normal rate and regular rhythm.     Pulses: Normal pulses.     Heart sounds: Normal heart sounds.  Pulmonary:     Effort: Pulmonary effort is normal.     Breath sounds: Normal breath sounds.  Neurological:     Mental Status: She is alert and oriented to person, place, and time. Mental status is at baseline.      UC Treatments / Results  Labs (all labs ordered are listed, but only abnormal results are displayed) Labs Reviewed - No data to display  EKG   Radiology DG Chest 2 View  Result Date: 09/02/2023 CLINICAL DATA:  Dry cough EXAM: CHEST - 2 VIEW COMPARISON:  Chest x-ray 07/07/2020 FINDINGS: Right chest port catheter tip projects over the SVC. The heart size and mediastinal contours are within normal limits. Both lungs are clear. Left shoulder arthroplasty is present. No acute fractures are seen. IMPRESSION: No active cardiopulmonary disease. Electronically Signed   By: Darliss Cheney M.D.   On: 09/02/2023 21:37     Procedures Procedures (including critical care time)  Medications Ordered in UC Medications - No data to display  Initial Impression / Assessment and Plan / UC Course  I have reviewed the triage vital signs and the nursing notes.  Pertinent labs & imaging results that were available during my care of the patient were reviewed by me and considered in my medical decision making (see chart for details).  Acute URI  Vital signs are stable and patient is in no signs of distress nontoxic-appearing, lungs are clear to auscultation and O2 saturation 98% on room air, stable for outpatient management, symptoms persisting for 2 weeks, chest x-ray pending, prescribed Augmentin and recommended attempting use of over-the-counter Delsym for cough suppressant, recommended additional supportive measures with follow-up with urgent care or primary doctor for any further concerns Final Clinical Impressions(s) / UC Diagnoses   Final diagnoses:  Acute URI     Discharge Instructions      Today you were evaluated for your persistent cough  On exam lungs are clear and you are getting enough air without assistance, persistent cough is most likely due to irritation to the airway from recent viral illness of COVID  Chest x-ray is pending, will most likely result overnight therefore you will be called with results via telephone sometime tomorrow morning  As your coughing has been persisting for 2 weeks we will prophylactically place you on antibiotic to provide coverage for bacteria  Begin Augmentin every morning and every evening for 7 days  Attempted use of over-the-counter Delsym to further assist with your coughing    Ensure that you are drinking adequate amounts of fluid  as this will help keep any mucus thin making it easier to expel  Ensure that bedroom is not warm dry or stuffy as it is more difficult to breathe and hot air, cool off the room using a fan or crack in the window or turn down the  temperature in your home  If you have a humidifier you may use at nighttime, if no humidifier you may steam up the bathroom and sit inside for 10 to 15 minutes prior to bed  For any further concerns you may follow-up with his urgent care or your primary doctor for reevaluation    ED Prescriptions     Medication Sig Dispense Auth. Provider   amoxicillin-clavulanate (AUGMENTIN) 875-125 MG tablet Take 1 tablet by mouth every 12 (twelve) hours. 14 tablet Veneta Sliter, Elita Boone, NP      PDMP not reviewed this encounter.   Valinda Hoar, [place] 09/03/23 [phone]

## 2023-09-03 NOTE — Telephone Encounter (Signed)
Attempted to call x 1, reporting chest x-ray results ,left voicemail to return call to clinic

## 2023-09-03 NOTE — Telephone Encounter (Signed)
Reported chest x-ray results of negative, 2 patient identifiers used, may begin antibiotic per treatment plan as discussed with follow-up with urgent care or primary doctor as needed

## 2023-09-03 NOTE — Progress Notes (Signed)
Patient for IR Port Removal on Wed 09/04/23, I called and spoke with the patient on the phone and gave pre-procedure instructions. Pt was made aware to be here at 2:30p. Pt stated understanding.  Called 09/03/23

## 2023-09-04 ENCOUNTER — Ambulatory Visit
Admission: RE | Admit: 2023-09-04 | Discharge: 2023-09-04 | Disposition: A | Discharge status: Home / Self Care | Payer: Medicare PPO | Source: Ambulatory Visit | Attending: Oncology | Admitting: Oncology

## 2023-09-04 DIAGNOSIS — Z452 Encounter for adjustment and management of vascular access device: Secondary | ICD-10-CM | POA: Diagnosis present

## 2023-09-04 DIAGNOSIS — C8331 Diffuse large B-cell lymphoma, lymph nodes of head, face, and neck: Secondary | ICD-10-CM | POA: Insufficient documentation

## 2023-09-04 HISTORY — PX: IR REMOVAL TUN ACCESS W/ PORT W/O FL MOD SED: IMG2290

## 2023-09-04 MED ORDER — LIDOCAINE HCL 1 % IJ SOLN
INTRAMUSCULAR | Status: AC
  Filled 2023-09-04: qty 20

## 2023-09-04 NOTE — Procedures (Signed)
Interventional Radiology Procedure Note  Procedure:   Right internal jugular port removal  Complications: None  Recommendations:  - Ok to shower tomorrow - Do not submerge for 7 days - Routine wound care   Signed,  Yvone Neu. Loreta Ave, DO

## 2023-09-05 ENCOUNTER — Telehealth: Payer: Self-pay | Admitting: *Deleted

## 2023-09-05 NOTE — Telephone Encounter (Signed)
I called to Aimi and let her know that Dr. Donneta Romberg states that from an oncology point of view she does not need the antibiotics before getting her teeth cleaned.  She said that Dr. Joesphine Bare felt like she should probably have him from the shoulder surgery.  I told Favour that she should probably call over to Dr. Christiana Pellant office and speak to them and if need be may be talk to the shoulder surgeon.  Dyamond states that the surgeon is no longer working anymore.  I told her to go ahead and call Dr. Christiana Pellant office and asked them what to do

## 2023-09-05 NOTE — Telephone Encounter (Signed)
Pt called to see when her appt is / it is 9/27 at 2:45 for labs and then 3 pm to see Dr. Leonard Schwartz.. pt has her port out so we changed the labs to be phlebotomy and she knows this

## 2023-09-06 ENCOUNTER — Inpatient Hospital Stay: Payer: Medicare PPO | Admitting: Internal Medicine

## 2023-09-06 ENCOUNTER — Inpatient Hospital Stay: Payer: Medicare PPO

## 2023-09-09 ENCOUNTER — Encounter: Payer: Self-pay | Admitting: Internal Medicine

## 2023-09-09 ENCOUNTER — Inpatient Hospital Stay (HOSPITAL_BASED_OUTPATIENT_CLINIC_OR_DEPARTMENT_OTHER): Payer: Medicare PPO | Admitting: Internal Medicine

## 2023-09-09 ENCOUNTER — Inpatient Hospital Stay: Payer: Medicare PPO | Attending: Internal Medicine

## 2023-09-09 DIAGNOSIS — F32A Depression, unspecified: Secondary | ICD-10-CM | POA: Insufficient documentation

## 2023-09-09 DIAGNOSIS — R682 Dry mouth, unspecified: Secondary | ICD-10-CM | POA: Insufficient documentation

## 2023-09-09 DIAGNOSIS — I4891 Unspecified atrial fibrillation: Secondary | ICD-10-CM | POA: Insufficient documentation

## 2023-09-09 DIAGNOSIS — Z79899 Other long term (current) drug therapy: Secondary | ICD-10-CM | POA: Insufficient documentation

## 2023-09-09 DIAGNOSIS — Z923 Personal history of irradiation: Secondary | ICD-10-CM | POA: Diagnosis not present

## 2023-09-09 DIAGNOSIS — Z800 Family history of malignant neoplasm of digestive organs: Secondary | ICD-10-CM | POA: Insufficient documentation

## 2023-09-09 DIAGNOSIS — K760 Fatty (change of) liver, not elsewhere classified: Secondary | ICD-10-CM | POA: Insufficient documentation

## 2023-09-09 DIAGNOSIS — F419 Anxiety disorder, unspecified: Secondary | ICD-10-CM | POA: Diagnosis not present

## 2023-09-09 DIAGNOSIS — E876 Hypokalemia: Secondary | ICD-10-CM | POA: Insufficient documentation

## 2023-09-09 DIAGNOSIS — Z8042 Family history of malignant neoplasm of prostate: Secondary | ICD-10-CM | POA: Diagnosis not present

## 2023-09-09 DIAGNOSIS — C8590 Non-Hodgkin lymphoma, unspecified, unspecified site: Secondary | ICD-10-CM

## 2023-09-09 DIAGNOSIS — C8331 Diffuse large B-cell lymphoma, lymph nodes of head, face, and neck: Secondary | ICD-10-CM

## 2023-09-09 DIAGNOSIS — I7121 Aneurysm of the ascending aorta, without rupture: Secondary | ICD-10-CM | POA: Diagnosis not present

## 2023-09-09 DIAGNOSIS — R590 Localized enlarged lymph nodes: Secondary | ICD-10-CM | POA: Insufficient documentation

## 2023-09-09 DIAGNOSIS — Z85118 Personal history of other malignant neoplasm of bronchus and lung: Secondary | ICD-10-CM | POA: Diagnosis not present

## 2023-09-09 LAB — CMP (CANCER CENTER ONLY)
ALT: 41 U/L (ref 0–44)
AST: 60 U/L — ABNORMAL HIGH (ref 15–41)
Albumin: 3.6 g/dL (ref 3.5–5.0)
Alkaline Phosphatase: 127 U/L — ABNORMAL HIGH (ref 38–126)
Anion gap: 8 (ref 5–15)
BUN: 17 mg/dL (ref 8–23)
CO2: 26 mmol/L (ref 22–32)
Calcium: 8.8 mg/dL — ABNORMAL LOW (ref 8.9–10.3)
Chloride: 106 mmol/L (ref 98–111)
Creatinine: 0.86 mg/dL (ref 0.44–1.00)
GFR, Estimated: >60 mL/min (ref >60)
Glucose, Bld: 139 mg/dL — ABNORMAL HIGH (ref 70–99)
Potassium: 4.2 mmol/L (ref 3.5–5.1)
Sodium: 140 mmol/L (ref 135–145)
Total Bilirubin: 0.6 mg/dL (ref 0.3–1.2)
Total Protein: 6.9 g/dL (ref 6.5–8.1)

## 2023-09-09 LAB — CBC WITH DIFFERENTIAL (CANCER CENTER ONLY)
Abs Immature Granulocytes: 0.03 10*3/uL (ref 0.00–0.07)
Basophils Absolute: 0.1 10*3/uL (ref 0.0–0.1)
Basophils Relative: 1 %
Eosinophils Absolute: 0.2 10*3/uL (ref 0.0–0.5)
Eosinophils Relative: 5 %
HCT: 34.8 % — ABNORMAL LOW (ref 36.0–46.0)
Hemoglobin: 11.6 g/dL — ABNORMAL LOW (ref 12.0–15.0)
Immature Granulocytes: 1 %
Lymphocytes Relative: 19 %
Lymphs Abs: 0.8 10*3/uL (ref 0.7–4.0)
MCH: 32.6 pg (ref 26.0–34.0)
MCHC: 33.3 g/dL (ref 30.0–36.0)
MCV: 97.8 fL (ref 80.0–100.0)
Monocytes Absolute: 0.3 10*3/uL (ref 0.1–1.0)
Monocytes Relative: 7 %
Neutro Abs: 2.7 10*3/uL (ref 1.7–7.7)
Neutrophils Relative %: 67 %
Platelet Count: 165 10*3/uL (ref 150–400)
RBC: 3.56 MIL/uL — ABNORMAL LOW (ref 3.87–5.11)
RDW: 13.4 % (ref 11.5–15.5)
WBC Count: 4.1 10*3/uL (ref 4.0–10.5)
nRBC: 0 % (ref 0.0–0.2)

## 2023-09-09 LAB — MAGNESIUM: Magnesium: 1.7 mg/dL (ref 1.7–2.4)

## 2023-09-09 LAB — LACTATE DEHYDROGENASE: LDH: 165 U/L (ref 98–192)

## 2023-09-09 NOTE — Assessment & Plan Note (Addendum)
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  Clinically stage II. Currently s/p R-CEOP chemotherapy every 3 weeks x 3 cycles-followed by involved field radiation.  Patient currently status post radiation-finished radiation on sep 7th, 2024- NOV 2023- incidentally- PET scan: Interval decrease and bilateral and symmetric increased uptake within the palatine tonsils; New mild increased radiotracer uptake within the pharyngeal tonsils is identified, left greater than right. S/p evaluation with Dr.Jeungle-negative for any recurrent malignancy. Stable.   # Dry mouth- continue sips of water.   # Anxiety/depression: On anxiolytic as per PCP.will not refill xanax/defer to PCP-   Stable.  # [PET scan- 2024] liver compatible with cirrhosis Slightly intermittent elevated LFts-?  Fatty liver.  Hepatitis panel normal; SEP 2024- [place] [[facility]]- NO mass lesion   # Card" Hx of A.fib [Dr.Fath]- Stable 4 cm ascending thoracic aortic aneurysm. Recommend annual imaging followup by CTA or MRA.-defer to cardiology.  On lasix 40 mg/day- see below;   stable.   # Electrolyte- mild hypokalemia/ hypomagnesia [sec to Lasix]- continue/recommend compliance Kdur once a day.refilled. -stable.  # IV access: port explanted- [sep 2024]- PIV  # DISPOSITION: # follow up in 6 months- MD; labs- Cbc/cmp/ldh;Mag Dr.B

## 2023-09-09 NOTE — Progress Notes (Signed)
Pt is getting over bronchitis. Has lingering cough.Appetite is good. Denies night sweats. With exertion and walking long distances she gets sweaty.(Hot flashes) NO pain. Bowels normal. Colonoscopy in 2 weeks.

## 2023-09-09 NOTE — Progress Notes (Signed)
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 Biopsy is suggestive of fatty liver changes  # carcinoma of lung status post left upper lobe resection in July of 2014; Adenocarcinoma T1N0 M0 tumor EGFR positive  SURGICAL PATHOLOGY  CASE: [id number]  PATIENT: Julie Jennings  Surgical Pathology Report      Specimen Submitted:  A. Lymph node, left neck   Clinical History: New left cervical lymphadenopathy.  History of  lymphoma and lung cancer.  New lymphadenopathy    MAY 17th, 2023- [Dr.juengle]; A. LYMPH NODE, LEFT NECK; ULTRASOUND-GUIDED BIOPSY:  - FINDINGS COMPATIBLE WITH LARGE B-CELL LYMPHOMA.   Comment:  Core biopsy sections display lymphoid tissue with a somewhat effaced  immunoarchitecture.  Portions of nodal tissue are comprised of sheets of  small lymphocytes, while other areas display dense background fibrosis,  and aggregates of large abnormal lymphocytes with irregular nuclear  contours, open chromatin, visible nucleoli, and retraction artifact.   Immunohistochemical studies demonstrate diffuse positivity for CD20  within regions comprised of larger lymphocytes, compatible with B cells.  CD3 highlights a background of small T cells.  CK AE1/AE3 is negative  for metastatic carcinoma.  PAX5 displays dim, patchy marking of larger  abnormal B cells.  In addition, these B cells appear to display dim  expression of CD10, with  positivity for Bcl-2, BCL6, and Mum-1.  B cells  are negative for CD5.  CD30 displays increased marking.  C-Myc is  positive, staining greater than 40% of larger cells.  CD45 (LCA) is  diffusely positive, without aberrant loss of expression.   Concurrent flow cytometric studies demonstrate a CD10 positive  monoclonal B-cell population in a background of many polytypic B cells,  representing 3% of total viable lymphoid cells and 8% of B cells.  For  further details, see scanned report in [facility].   The patient's history is of diffuse large B-cell lymphoma, as well as  invasive adenocarcinoma of the lung are noted.  Biopsy sections  demonstrate an abnormal proliferation of large B cells, compatible with  involvement by a diffuse large B-cell lymphoma. Further  subclassification is difficult, secondary to limited tissue, however,  presence of C10 marking would suggest a germinal center immunophenotype.  In addition, there does appear to be expression of both Bcl-2 and c-myc,  which may suggest a more aggressive process. There is no evidence of  metastatic adenocarcinoma. FISH testing for prognostically significant  abnormalities of BCL2, BCL6, and MYC will be attempted, and reported as  an addendum.   IMPRESSION: 1. Enlarged hypermetabolic lymph nodes in the bilateral left greater than right neck and asymmetric right palatine tonsil hypermetabolism with associated soft tissue fullness on the CT images, all new since 2014 PET-CT, most compatible with recurrent lymphoma. Deauville category 5.  # MAY 12th, 2023- DLBCL- STAGE II [NO Bone marrow]; GCB; double expresser-mcy; Bcl-2; QNS-FISH.   # MAY 30th, 2023- RCEOP q 3 W x3-RT [finished radiation-sep 7th, 2023]   Cancer of upper lobe of  left lung (HCC)  Diffuse large B-cell lymphoma of lymph nodes of neck (HCC)  04/25/2022 Initial Diagnosis   Diffuse large B-cell lymphoma of lymph nodes of neck (HCC)   04/26/2022 Cancer Staging   Staging  form: Hodgkin and Non-Hodgkin Lymphoma, AJCC 8th Edition - Clinical: Stage II - Signed by Earna Coder, MD on 04/26/2022 Histopathologic type: Malignant lymphoma, large B-cell, diffuse, NOS   05/08/2022 - 06/22/2022 Chemotherapy   [name]      INTERVAL HISTORY: Patient is alone.   She is ambulating independently.  Julie Jennings 72 y.o.  female pleasant patient with extreme anxiety; and diffuse large B cell lymphoma [prior history of 2005 DLCBL] - status post R-CEOP cycle #3; also followed by radiation finished SEP 2023 is here for a follow up.   Pt is getting over bronchitis. Has lingering cough.Appetite is good. Denies night sweats. With exertion and walking long distances she gets sweaty.(Hot flashes) NO pain. Bowels normal. Colonoscopy in 2 weeks    Chronic mild tremor.  Chronic dry mouth.   No weight loss. Patient denies any worsening enlargement of her lymph nodes.    Review of Systems  Constitutional:  Negative for chills, diaphoresis, fever, malaise/fatigue and weight loss.  HENT:  Negative for nosebleeds and sore throat.   Eyes:  Negative for double vision.  Respiratory:  Negative for cough, hemoptysis, sputum production, shortness of breath and wheezing.   Cardiovascular:  Negative for chest pain, palpitations, orthopnea and leg swelling.  Gastrointestinal:  Negative for abdominal pain, blood in stool, constipation, diarrhea, heartburn, melena, nausea and vomiting.  Musculoskeletal:  Positive for joint pain. Negative for back pain.  Skin: Negative.  Negative for itching and rash.  Neurological:  Negative for dizziness, tingling, focal weakness, weakness and headaches.  Endo/Heme/Allergies:  Does not bruise/bleed easily.  Psychiatric/Behavioral:  Negative for depression. The patient is nervous/anxious. The patient does not have insomnia.      PAST MEDICAL HISTORY :  Past Medical History:  Diagnosis Date    A-fib (HCC)    Only once   Anxiety    Arthritis    oesteoarthritis   BP (high blood pressure) 04/16/2014   Chronic kidney disease    history nephrolithiasis   Depression    Dysrhythmia    PSVT   Endometriosis    Herpes zoster    History of kidney stones    Lung cancer (HCC) left   Lymphoma (HCC)    "stomach"   Stroke (HCC)     PAST SURGICAL HISTORY :   Past Surgical History:  Procedure Laterality Date   AUGMENTATION MAMMAPLASTY Bilateral    CHOLECYSTECTOMY     COLONOSCOPY     COLONOSCOPY WITH PROPOFOL N/A 04/14/2018   Procedure: COLONOSCOPY WITH PROPOFOL;  Surgeon: Scot Jun, MD;  Location: [facility] ENDOSCOPY;  Service: Endoscopy;  Laterality: N/A;   DILATION AND CURETTAGE OF UTERUS     HEMORRHOIDECTOMY WITH HEMORRHOID BANDING     HERNIA REPAIR     umbilical hernia   IR IMAGING GUIDED PORT INSERTION  05/04/2022   IR REMOVAL TUN ACCESS W/ PORT W/O FL MOD SED  09/04/2023   LUNG REMOVAL, PARTIAL Left    REVERSE SHOULDER ARTHROPLASTY Left 01/04/2022   Procedure: Left reverse shoulder arthroplasty, biceps tenodesis;  Surgeon: Signa Kell, MD;  Location: [facility] ORS;  Service: Orthopedics;  Laterality: Left;    FAMILY HISTORY :   Family History  Problem Relation  Age of Onset   Stroke Mother    Diabetes Mother    Colon cancer Father    Prostate cancer Father    Breast cancer Neg Hx     SOCIAL HISTORY:   Social History   Tobacco Use   Smoking status: Never   Smokeless tobacco: Never  Vaping Use   Vaping status: Never Used  Substance Use Topics   Alcohol use: No   Drug use: No    ALLERGIES:  is allergic to tylenol [acetaminophen], buspirone, and codeine.  MEDICATIONS:  Current Outpatient Medications  Medication Sig Dispense Refill   ALPRAZolam (XANAX) 0.5 MG tablet Take 1 tablet (0.5 mg total) by mouth 3 (three) times daily. 5 tablet 0   amoxicillin-clavulanate (AUGMENTIN) 875-125 MG tablet Take 1 tablet by mouth every 12 (twelve) hours. 14 tablet 0    ARIPiprazole (ABILIFY) 2 MG tablet Take 2 mg by mouth daily.     aspirin 81 MG chewable tablet Chew by mouth.     furosemide (LASIX) 40 MG tablet Take 40 mg by mouth daily.     lidocaine-prilocaine (EMLA) cream Apply on the port. 30 -45 min  prior to port access. 30 g 3   losartan (COZAAR) 100 MG tablet Take 100 mg by mouth daily.     nebivolol (BYSTOLIC) 10 MG tablet Take 10 mg by mouth daily.     pantoprazole (PROTONIX) 40 MG tablet Take 40 mg by mouth 2 (two) times daily.     Polyethyl Glycol-Propyl Glycol (SYSTANE) 0.4-0.3 % SOLN Place 1 drop into both eyes daily as needed (Dry eye).     potassium chloride SA (KLOR-CON M) 20 MEQ tablet One pill a day. 30 tablet 3   prochlorperazine (COMPAZINE) 10 MG tablet Take 1 tablet (10 mg total) by mouth every 6 (six) hours as needed for nausea or vomiting. 40 tablet 0   QUEtiapine (SEROQUEL) 25 MG tablet Take 1 tablet (25 mg total) by mouth at bedtime. 90 tablet 0   tiotropium (SPIRIVA) 18 MCG inhalation capsule Place 18 mcg into inhaler and inhale daily.     triamcinolone (NASACORT) 55 MCG/ACT AERO nasal inhaler Place 2 sprays into the nose daily.     venlafaxine XR (EFFEXOR-XR) 75 MG 24 hr capsule Take 3 capsules (225 mg total) by mouth daily with breakfast. 90 capsule 1   ondansetron (ZOFRAN) 8 MG tablet Every 8 hours as needed for nausea/vomitting. (Patient not taking: Reported on 11/06/2022) 40 tablet 2   No current facility-administered medications for this visit.   Facility-Administered Medications Ordered in Other Visits  Medication Dose Route Frequency Provider Last Rate Last Admin   sodium chloride flush (NS) 0.9 % injection 10 mL  10 mL Intravenous PRN Earna Coder, MD   10 mL at 01/22/23 1452    PHYSICAL EXAMINATION: ECOG PERFORMANCE STATUS: 0 - Asymptomatic  BP 109/78 (BP Location: Left Arm, Patient Position: Sitting)   Pulse 68   Temp 98.6 F (37 C) (Tympanic)   Ht 5\' 7"  (1.702 m)   Wt 186 lb (84.4 kg)   SpO2 98%    BMI 29.13 kg/m   Filed Weights   09/09/23 1410  Weight: 186 lb (84.4 kg)        Left neck lymphadenopathy 2 to 3 cm in size noted.  Nontender.  Physical Exam HENT:     Head: Normocephalic and atraumatic.     Mouth/Throat:     Pharynx: No oropharyngeal exudate.  Eyes:     Pupils:  Pupils are equal, round, and reactive to light.  Cardiovascular:     Rate and Rhythm: Normal rate and regular rhythm.  Pulmonary:     Effort: Pulmonary effort is normal. No respiratory distress.     Breath sounds: Normal breath sounds. No wheezing.  Abdominal:     General: Bowel sounds are normal. There is no distension.     Palpations: Abdomen is soft. There is no mass.     Tenderness: There is no abdominal tenderness. There is no guarding or rebound.  Musculoskeletal:        General: No tenderness. Normal range of motion.     Cervical back: Normal range of motion and neck supple.  Skin:    General: Skin is warm.  Neurological:     Mental Status: She is alert and oriented to person, place, and time.  Psychiatric:        Mood and Affect: Affect normal.      LABORATORY DATA:  I have reviewed the data as listed    Component Value Date/Time   NA 140 09/09/2023 1321   NA 139 03/14/2015 0856   K 4.2 09/09/2023 1321   K 4.1 03/14/2015 0856   CL 106 09/09/2023 1321   CL 103 03/14/2015 0856   CO2 26 09/09/2023 1321   CO2 28 03/14/2015 0856   GLUCOSE 139 (H) 09/09/2023 1321   GLUCOSE 111 (H) 03/14/2015 0856   BUN 17 09/09/2023 1321   BUN 19 03/14/2015 0856   CREATININE 0.86 09/09/2023 1321   CREATININE 0.56 03/14/2015 0856   CALCIUM 8.8 (L) 09/09/2023 1321   CALCIUM 9.3 03/14/2015 0856   PROT 6.9 09/09/2023 1321   PROT 7.7 03/14/2015 0856   ALBUMIN 3.6 09/09/2023 1321   ALBUMIN 4.3 03/14/2015 0856   AST 60 (H) 09/09/2023 1321   ALT 41 09/09/2023 1321   ALT 87 (H) 03/14/2015 0856   ALKPHOS 127 (H) 09/09/2023 1321   ALKPHOS 164 (H) 03/14/2015 0856   BILITOT 0.6 09/09/2023 1321    GFRNONAA >60 09/09/2023 1321   GFRNONAA >60 03/14/2015 0856   GFRAA >60 07/07/2020 1041   GFRAA >60 03/14/2015 0856    No results found for: "SPEP", "UPEP"  Lab Results  Component Value Date   WBC 4.1 09/09/2023   NEUTROABS 2.7 09/09/2023   HGB 11.6 (L) 09/09/2023   HCT 34.8 (L) 09/09/2023   MCV 97.8 09/09/2023   PLT 165 09/09/2023      Chemistry      Component Value Date/Time   NA 140 09/09/2023 1321   NA 139 03/14/2015 0856   K 4.2 09/09/2023 1321   K 4.1 03/14/2015 0856   CL 106 09/09/2023 1321   CL 103 03/14/2015 0856   CO2 26 09/09/2023 1321   CO2 28 03/14/2015 0856   BUN 17 09/09/2023 1321   BUN 19 03/14/2015 0856   CREATININE 0.86 09/09/2023 1321   CREATININE 0.56 03/14/2015 0856      Component Value Date/Time   CALCIUM 8.8 (L) 09/09/2023 1321   CALCIUM 9.3 03/14/2015 0856   ALKPHOS 127 (H) 09/09/2023 1321   ALKPHOS 164 (H) 03/14/2015 0856   AST 60 (H) 09/09/2023 1321   ALT 41 09/09/2023 1321   ALT 87 (H) 03/14/2015 0856   BILITOT 0.6 09/09/2023 1321       RADIOGRAPHIC STUDIES: I have personally reviewed the radiological images as listed and agreed with the findings in the report. No results found.   ASSESSMENT & PLAN:  Diffuse large  B-cell lymphoma of lymph nodes of neck (HCC) #Diffuse B-cell lymphoma-bilateral neck left more than right PET scan.  Clinically stage II. Currently s/p R-CEOP chemotherapy every 3 weeks x 3 cycles-followed by involved field radiation.  Patient currently status post radiation-finished radiation on sep 7th, 2024- NOV 2023- incidentally- PET scan: Interval decrease and bilateral and symmetric increased uptake within the palatine tonsils; New mild increased radiotracer uptake within the pharyngeal tonsils is identified, left greater than right. S/p evaluation with Dr.Jeungle-negative for any recurrent malignancy. Stable.   # Dry mouth- continue sips of water.   # Anxiety/depression: On anxiolytic as per PCP.will not refill  xanax/defer to PCP-   Stable.  # [PET scan- 2024] liver compatible with cirrhosis Slightly intermittent elevated LFts-?  Fatty liver.  Hepatitis panel normal; SEP 2024- [place] [[facility]]- NO mass lesion   # Card" Hx of A.fib [Dr.Fath]- Stable 4 cm ascending thoracic aortic aneurysm. Recommend annual imaging followup by CTA or MRA.-defer to cardiology.  On lasix 40 mg/day- see below;   stable.   # Electrolyte- mild hypokalemia/ hypomagnesia [sec to Lasix]- continue/recommend compliance Kdur once a day.refilled. -stable.  # IV access: port explanted- [sep 2024]- PIV  # DISPOSITION: # follow up in 6 months- MD; labs- Cbc/cmp/ldh;Mag Dr.B    Orders Placed This Encounter  Procedures   CBC with Differential (Cancer Center Only)    Standing Status:   Future    Standing Expiration Date:   09/08/2024   CMP (Cancer Center only)    Standing Status:   Future    Standing Expiration Date:   09/08/2024   Lactate dehydrogenase    Standing Status:   Future    Standing Expiration Date:   09/08/2024   Magnesium    Standing Status:   Future    Standing Expiration Date:   09/08/2024   All questions were answered. The patient knows to call the clinic with any problems, questions or concerns.      Earna Coder, MD 09/09/2023 2:31 PM

## 2023-09-11 ENCOUNTER — Ambulatory Visit
Admission: EM | Admit: 2023-09-11 | Discharge: 2023-09-11 | Disposition: A | Discharge status: Home / Self Care | Payer: Medicare PPO | Attending: Emergency Medicine | Admitting: Emergency Medicine

## 2023-09-11 DIAGNOSIS — R21 Rash and other nonspecific skin eruption: Secondary | ICD-10-CM

## 2023-09-11 DIAGNOSIS — R2242 Localized swelling, mass and lump, left lower limb: Secondary | ICD-10-CM | POA: Diagnosis not present

## 2023-09-11 MED ORDER — VALACYCLOVIR HCL 1 G PO TABS: 1000.0000 mg | ORAL_TABLET | Freq: Two times a day (BID) | ORAL | 0 refills | Status: AC

## 2023-09-11 MED ORDER — PREDNISONE 20 MG PO TABS: 40.0000 mg | ORAL_TABLET | Freq: Every day | ORAL | 0 refills | Status: DC

## 2023-09-11 NOTE — ED Provider Notes (Signed)
Renaldo Fiddler    CSN: [id number] Arrival date & time: 09/11/23  1536      History   Chief Complaint Chief Complaint  Patient presents with   Foot Pain    HPI Julie Jennings is a 72 y.o. female.   Patient presents for evaluation of erythematous rash present to the left foot and left foot swelling beginning 1 day ago.  Endorses that she coated foot with cortisone cream which did improve redness and swelling but endorses that she ran a lot of errands today requiring walking and symptoms slightly worsened.  When symptoms initially began did have a slight burning sensation to the toes which has resolved.  Denies drainage or fever.  Denies changes in toiletries, shoes, medications.  Past Medical History:  Diagnosis Date   A-fib (HCC)    Only once   Anxiety    Arthritis    oesteoarthritis   BP (high blood pressure) 04/16/2014   Chronic kidney disease    history nephrolithiasis   Depression    Dysrhythmia    PSVT   Endometriosis    Herpes zoster    History of kidney stones    Lung cancer (HCC) left   Lymphoma (HCC)    "stomach"   Stroke [facility])     Patient Active Problem List   Diagnosis Date Noted   Paroxysmal atrial fibrillation (HCC) 09/26/2022   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   Proximal humerus fracture 01/04/2022   SOB (shortness of breath) 05/19/2020   Osteopenia of multiple sites 09/09/2018   Post-menopausal bleeding 07/31/2018   History of screening mammography 09/28/2016   Allergic rhinitis 09/13/2015   Cerebral infarction (HCC) 09/13/2015   Clinical depression 09/13/2015   Endometriosis 09/13/2015   Fatty infiltration of liver 09/13/2015   Adaptive colitis 09/13/2015   Menopause 09/13/2015   Calculus of kidney 09/13/2015   Arthritis, degenerative 09/13/2015   Awareness of heartbeats 09/13/2015   Paroxysmal supraventricular tachycardia (HCC) 09/13/2015   Abnormal LFTs 04/30/2014   Accumulation of fluid in  tissues 04/30/2014   Edema 04/30/2014   Anxiety 04/16/2014   Personal history of other diseases of the circulatory system 04/16/2014   BP (high blood pressure) 04/16/2014   HLD (hyperlipidemia) 04/16/2014   Lymphoma (HCC) 04/16/2014   Cancer of upper lobe of left lung (HCC) 06/28/2013    Past Surgical History:  Procedure Laterality Date   AUGMENTATION MAMMAPLASTY Bilateral    CHOLECYSTECTOMY     COLONOSCOPY     COLONOSCOPY WITH PROPOFOL N/A 04/14/2018   Procedure: COLONOSCOPY WITH PROPOFOL;  Surgeon: Scot Jun, MD;  Location: [facility] ENDOSCOPY;  Service: Endoscopy;  Laterality: N/A;   DILATION AND CURETTAGE OF UTERUS     HEMORRHOIDECTOMY WITH HEMORRHOID BANDING     HERNIA REPAIR     umbilical hernia   IR IMAGING GUIDED PORT INSERTION  05/04/2022   IR REMOVAL TUN ACCESS W/ PORT W/O FL MOD SED  09/04/2023   LUNG REMOVAL, PARTIAL Left    REVERSE SHOULDER ARTHROPLASTY Left 01/04/2022   Procedure: Left reverse shoulder arthroplasty, biceps tenodesis;  Surgeon: Signa Kell, MD;  Location: [facility] ORS;  Service: Orthopedics;  Laterality: Left;    OB History     Gravida  0   Para  0   Term  0   Preterm  0   AB  0   Living  0      SAB  0   IAB  0   Ectopic  0   Multiple  0   Live Births  0            Home Medications    Prior to Admission medications   Medication Sig Start Date End Date Taking? Authorizing Provider  predniSONE (DELTASONE) 20 MG tablet Take 2 tablets (40 mg total) by mouth daily. 09/11/23  Yes Mitch Arquette, Elita Boone, NP  valACYclovir (VALTREX) 1000 MG tablet Take 1 tablet (1,000 mg total) by mouth 2 (two) times daily for 7 days. 09/11/23 09/18/23 Yes Aarion Kittrell, Elita Boone, NP  ALPRAZolam (XANAX) 0.5 MG tablet Take 1 tablet (0.5 mg total) by mouth 3 (three) times daily. 11/22/22   Earna Coder, MD  amoxicillin-clavulanate (AUGMENTIN) 875-125 MG tablet Take 1 tablet by mouth every 12 (twelve) hours. 09/02/23   Chaske Paskett, Elita Boone, NP  ARIPiprazole  (ABILIFY) 2 MG tablet Take 2 mg by mouth daily. 11/23/22   [provider]  aspirin 81 MG chewable tablet Chew by mouth.    [provider]  furosemide (LASIX) 40 MG tablet Take 40 mg by mouth daily. 09/30/14 09/09/23  [provider]  lidocaine-prilocaine (EMLA) cream Apply on the port. 30 -45 min  prior to port access. 05/20/23   Earna Coder, MD  losartan (COZAAR) 100 MG tablet Take 100 mg by mouth daily. 06/24/20   [provider]  nebivolol (BYSTOLIC) 10 MG tablet Take 10 mg by mouth daily. 09/30/14   [provider]  ondansetron (ZOFRAN) 8 MG tablet Every 8 hours as needed for nausea/vomitting. Patient not taking: Reported on 11/06/2022 06/18/22   Earna Coder, MD  pantoprazole (PROTONIX) 40 MG tablet Take 40 mg by mouth 2 (two) times daily. 04/22/17 09/09/23  [provider]  Polyethyl Glycol-Propyl Glycol (SYSTANE) 0.4-0.3 % SOLN Place 1 drop into both eyes daily as needed (Dry eye).    [provider]  potassium chloride SA (KLOR-CON M) 20 MEQ tablet One pill a day. 02/11/23   Earna Coder, MD  prochlorperazine (COMPAZINE) 10 MG tablet Take 1 tablet (10 mg total) by mouth every 6 (six) hours as needed for nausea or vomiting. 10/14/22   Creig Hines, MD  QUEtiapine (SEROQUEL) 25 MG tablet Take 1 tablet (25 mg total) by mouth at bedtime. 07/26/22 09/09/23  Neysa Hotter, MD  tiotropium (SPIRIVA) 18 MCG inhalation capsule Place 18 mcg into inhaler and inhale daily. 09/30/14   [provider]  triamcinolone (NASACORT) 55 MCG/ACT AERO nasal inhaler Place 2 sprays into the nose daily.    [provider]  venlafaxine XR (EFFEXOR-XR) 75 MG 24 hr capsule Take 3 capsules (225 mg total) by mouth daily with breakfast. 06/26/22   Borders, Daryl Eastern, NP    Family History Family History  Problem Relation Age of Onset   Stroke Mother    Diabetes Mother    Colon cancer Father    Prostate cancer Father     Breast cancer Neg Hx     Social History Social History   Tobacco Use   Smoking status: Never   Smokeless tobacco: Never  Vaping Use   Vaping status: Never Used  Substance Use Topics   Alcohol use: No   Drug use: No     Allergies   Tylenol [acetaminophen], Buspirone, and Codeine   Review of Systems Review of Systems   Physical Exam Triage Vital Signs ED Triage Vitals  Encounter Vitals Group     BP 09/11/23 1548 129/87     Systolic BP  Percentile --      Diastolic BP Percentile --      Pulse Rate 09/11/23 1548 77     Resp 09/11/23 1548 16     Temp 09/11/23 1548 97.8 F (36.6 C)     Temp Source 09/11/23 1548 Temporal     SpO2 09/11/23 1548 96 %     Weight --      Height --      Head Circumference --      Peak Flow --      Pain Score 09/11/23 1608 0     Pain Loc --      Pain Education --      Exclude from Growth Chart --    No data found.  Updated Vital Signs BP 129/87 (BP Location: Right Arm)   Pulse 77   Temp 97.8 F (36.6 C) (Temporal)   Resp 16   SpO2 96%   Visual Acuity Right Eye Distance:   Left Eye Distance:   Bilateral Distance:    Right Eye Near:   Left Eye Near:    Bilateral Near:     Physical Exam Constitutional:      Appearance: Normal appearance.  Eyes:     Extraocular Movements: Extraocular movements intact.  Pulmonary:     Effort: Pulmonary effort is normal.  Skin:    Comments: Erythematous macular papular rash present to the lateral medial posterior aspects of the foot and to the tips of the toes, no involvement of the midfoot along the plantar or dorsal aspect, no drainage noted, edema present to the foot and ankle, 2+ pedal and dorsalis pedis pulse, able to bear weight and to complete all range of motion, plantar aspect of the foot peeling and cracking  Neurological:     Mental Status: She is alert and oriented to person, place, and time. Mental status is at baseline.      UC Treatments / Results  Labs (all labs ordered  are listed, but only abnormal results are displayed) Labs Reviewed - No data to display  EKG   Radiology No results found.  Procedures Procedures (including critical care time)  Medications Ordered in UC Medications - No data to display  Initial Impression / Assessment and Plan / UC Course  I have reviewed the triage vital signs and the nursing notes.  Pertinent labs & imaging results that were available during my care of the patient were reviewed by me and considered in my medical decision making (see chart for details).  Localized swelling of the left foot, rash  Unknown etiology, presentation not consistent with infection, responding to topical cortisone, appearance of rash is concerning for shingles however location is atypical, discussed this with patient will provide coverage for both inflammatory and viral cause, prescribed oral prednisone and valacyclovir, discussed administration, recommended supportive care through elevation, ibuprofen, heat or ice with activity as tolerated, may follow-up with urgent care or primary doctor for reevaluation as needed Final Clinical Impressions(s) / UC Diagnoses   Final diagnoses:  Localized swelling of left foot  Rash     Discharge Instructions      Unsure of the exact cause of the redness and swelling to your foot however presentation is not consistent with infection of the skin or the veins, do have a lingering concern due to the clustering appearance of the rash for shingles however the location is not common  Since the rash did respond to topical steroids we will provide you with an oral course  Begin oral prednisone every morning with food for 5 days May continue to apply topical hydrocortisone additionally as needed  Begin valacyclovir twice daily for 7 days use any virus in the body contributing to symptoms  Elevate legs whenever sitting and lying to help reduce swelling  May apply heat or ice over the affected area in 10  to 15-minute intervals for comfort  May take Tylenol additionally as needed for any pain  If you have any concerns regarding healing may follow-up for reevaluation    ED Prescriptions     Medication Sig Dispense Auth. Provider   predniSONE (DELTASONE) 20 MG tablet Take 2 tablets (40 mg total) by mouth daily. 10 tablet Albana Saperstein, Hansel Starling R, NP   valACYclovir (VALTREX) 1000 MG tablet Take 1 tablet (1,000 mg total) by mouth 2 (two) times daily for 7 days. 14 tablet Evalena Fujii, Elita Boone, NP      PDMP not reviewed this encounter.   Valinda Hoar, NP 09/11/23 1622

## 2023-09-11 NOTE — ED Triage Notes (Signed)
Patient presents to UC for left foot redness and pain since 1 day. Denies fever.

## 2023-09-11 NOTE — Discharge Instructions (Signed)
Unsure of the exact cause of the redness and swelling to your foot however presentation is not consistent with infection of the skin or the veins, do have a lingering concern due to the clustering appearance of the rash for shingles however the location is not common  Since the rash did respond to topical steroids we will provide you with an oral course  Begin oral prednisone every morning with food for 5 days May continue to apply topical hydrocortisone additionally as needed  Begin valacyclovir twice daily for 7 days use any virus in the body contributing to symptoms  Elevate legs whenever sitting and lying to help reduce swelling  May apply heat or ice over the affected area in 10 to 15-minute intervals for comfort  May take Tylenol additionally as needed for any pain  If you have any concerns regarding healing may follow-up for reevaluation

## 2023-09-20 ENCOUNTER — Encounter: Payer: Self-pay | Admitting: *Deleted

## 2023-09-24 ENCOUNTER — Encounter: Payer: Self-pay | Admitting: Internal Medicine

## 2023-09-25 ENCOUNTER — Ambulatory Visit: Payer: Medicare PPO | Admitting: Podiatry

## 2023-09-25 ENCOUNTER — Ambulatory Visit: Payer: Medicare PPO

## 2023-09-25 ENCOUNTER — Encounter: Payer: Self-pay | Admitting: Podiatry

## 2023-09-25 DIAGNOSIS — B353 Tinea pedis: Secondary | ICD-10-CM | POA: Diagnosis not present

## 2023-09-25 MED ORDER — NAFTIFINE HCL 2 % EX CREA: 1.0000 | TOPICAL_CREAM | CUTANEOUS | 2 refills | Status: DC

## 2023-09-25 NOTE — Progress Notes (Signed)
Subjective:  Patient ID: Julie Jennings, female    DOB: January 25, 1951,  MRN: [id number] HPI Chief Complaint  Patient presents with   Skin Problem    Feet and ankles bilateral (L>R) - patient is on chemo, has noticed red splotchy areas on feet and ankles and some on lower leg on the left, she is also having some swelling both lower extremities, she has went twice to walkin clinic-Rx'd antibiotics initially, then at follow up Rx'd prednisone, noticed swelling has increased, skin has been peeling on the left, concerned that she still has the swelling, no itching, no pain   New Patient (Initial Visit)    72 y.o. female presents with the above complaint.   ROS: Denies fever chills nausea vomiting muscle aches pains calf pain back pain chest pain shortness of breath.  Past Medical History:  Diagnosis Date   A-fib (HCC)    Only once   Anxiety    Arthritis    oesteoarthritis   BP (high blood pressure) 04/16/2014   Chronic kidney disease    history nephrolithiasis   Depression    Dysrhythmia    PSVT   Endometriosis    Herpes zoster    History of kidney stones    Lung cancer (HCC) left   Lymphoma (HCC)    "stomach"   Stroke [facility])    Past Surgical History:  Procedure Laterality Date   AUGMENTATION MAMMAPLASTY Bilateral    CHOLECYSTECTOMY     COLONOSCOPY     COLONOSCOPY WITH PROPOFOL N/A 04/14/2018   Procedure: COLONOSCOPY WITH PROPOFOL;  Surgeon: Scot Jun, MD;  Location: [facility] ENDOSCOPY;  Service: Endoscopy;  Laterality: N/A;   DILATION AND CURETTAGE OF UTERUS     HEMORRHOIDECTOMY WITH HEMORRHOID BANDING     HERNIA REPAIR     umbilical hernia   IR IMAGING GUIDED PORT INSERTION  05/04/2022   IR REMOVAL TUN ACCESS W/ PORT W/O FL MOD SED  09/04/2023   LUNG REMOVAL, PARTIAL Left    REVERSE SHOULDER ARTHROPLASTY Left 01/04/2022   Procedure: Left reverse shoulder arthroplasty, biceps tenodesis;  Surgeon: Signa Kell, MD;  Location: [facility] ORS;  Service: Orthopedics;  Laterality: Left;     Current Outpatient Medications:    benzonatate (TESSALON) 200 MG capsule, Take by mouth., Disp: , Rfl:    buPROPion (WELLBUTRIN XL) 150 MG 24 hr tablet, Take by mouth., Disp: , Rfl:    furosemide (LASIX) 40 MG tablet, Take 40 mg by mouth daily., Disp: , Rfl:    meloxicam (MOBIC) 15 MG tablet, Take by mouth., Disp: , Rfl:    Naftifine HCl (NAFTIN) 2 % CREA, Apply 1 Application topically 1 day or 1 dose., Disp: 45 g, Rfl: 2   pantoprazole (PROTONIX) 40 MG tablet, Take 40 mg by mouth 2 (two) times daily., Disp: , Rfl:    ALPRAZolam (XANAX) 0.5 MG tablet, Take 1 tablet (0.5 mg total) by mouth 3 (three) times daily., Disp: 5 tablet, Rfl: 0   amoxicillin-clavulanate (AUGMENTIN) 875-125 MG tablet, Take 1 tablet by mouth every 12 (twelve) hours., Disp: 14 tablet, Rfl: 0   ARIPiprazole (ABILIFY) 2 MG tablet, Take 2 mg by mouth daily., Disp: , Rfl:    aspirin 81 MG chewable tablet, Chew by mouth., Disp: , Rfl:    lidocaine-prilocaine (EMLA) cream, Apply on the port. 30 -45 min  prior to port access., Disp: 30 g, Rfl: 3   losartan (COZAAR) 100 MG tablet, Take 100 mg by mouth daily., Disp: , Rfl:  nebivolol (BYSTOLIC) 10 MG tablet, Take 10 mg by mouth daily., Disp: , Rfl:    Polyethyl Glycol-Propyl Glycol (SYSTANE) 0.4-0.3 % SOLN, Place 1 drop into both eyes daily as needed (Dry eye)., Disp: , Rfl:    potassium chloride SA (KLOR-CON M) 20 MEQ tablet, One pill a day., Disp: 30 tablet, Rfl: 3   predniSONE (DELTASONE) 20 MG tablet, Take 2 tablets (40 mg total) by mouth daily., Disp: 10 tablet, Rfl: 0   prochlorperazine (COMPAZINE) 10 MG tablet, Take 1 tablet (10 mg total) by mouth every 6 (six) hours as needed for nausea or vomiting., Disp: 40 tablet, Rfl: 0   QUEtiapine (SEROQUEL) 25 MG tablet, Take 1 tablet (25 mg total) by mouth at bedtime., Disp: 90 tablet, Rfl: 0   tiotropium (SPIRIVA) 18 MCG inhalation capsule, Place 18 mcg into inhaler and inhale daily., Disp: , Rfl:    triamcinolone (NASACORT)  55 MCG/ACT AERO nasal inhaler, Place 2 sprays into the nose daily., Disp: , Rfl:    venlafaxine XR (EFFEXOR-XR) 75 MG 24 hr capsule, Take 3 capsules (225 mg total) by mouth daily with breakfast., Disp: 90 capsule, Rfl: 1 No current facility-administered medications for this visit.  Facility-Administered Medications Ordered in Other Visits:    sodium chloride flush (NS) 0.9 % injection 10 mL, 10 mL, Intravenous, PRN, Louretta Shorten R, MD, 10 mL at 01/22/23 1452  Allergies  Allergen Reactions   Tylenol [Acetaminophen] Other (See Comments)    Contraindication due to Lymphoma which affected liver    Buspirone Nausea And Vomiting   Codeine Nausea And Vomiting   Review of Systems Objective:  There were no vitals filed for this visit.  General: Well developed, nourished, in no acute distress, alert and oriented x3   Dermatological: Skin is warm, dry and supple bilateral. Nails 1 through 5 of the right foot appear to be normal 1 through 5 the left foot are thick yellow dystrophic clinically mycotic.  Remaining integument appears unremarkable at this time. There are no open sores, no preulcerative lesions, moccasin splotchy red distribution of petechial type lesions with scales and ruptured vesicles are overlying the skin digits plantar surface of the foot.  It appears to be tinea pedis.   Vascular: Dorsalis Pedis artery and Posterior Tibial artery pedal pulses are 2/4 bilateral with immedate capillary fill time. Pedal hair growth present.  She has pitting edema to the dorsum of the foot but the swelling in her legs is nonpitting.  No ulcerative lesions.  No calf pain  Neruologic: Grossly intact via light touch bilateral. Vibratory intact via tuning fork bilateral. Protective threshold with Semmes Wienstein monofilament intact to all pedal sites bilateral. Patellar and Achilles deep tendon reflexes 2+ bilateral. No Babinski or clonus noted bilateral.   Musculoskeletal: No gross boney pedal  deformities bilateral. No pain, crepitus, or limitation noted with foot and ankle range of motion bilateral. Muscular strength 5/5 in all groups tested bilateral.  Gait: Unassisted, Nonantalgic.    Radiographs:  None taken  Assessment & Plan:   Assessment: Tinea pedis left.  Swelling of the bilateral lower extremity  Plan: Start her on Naftin cream will follow-up with her in 2 to 3 weeks.  I did ensure through her pharmacy that she is refilling her fluid pill though it does show in the computer that had been discontinued.  The pharmacist stated that she has a new wound waiting to be picked up.  If she is not improved next visit we will send for vascular  studies.     Arlyss Weathersby T. [place], [place]

## 2023-09-26 ENCOUNTER — Telehealth: Payer: Self-pay

## 2023-09-26 ENCOUNTER — Telehealth: Payer: Self-pay | Admitting: Podiatry

## 2023-09-26 NOTE — Telephone Encounter (Signed)
PA received from covermymeds for Naftifine HCL 2% cream. PA initiated through covermymeds and waiting on response.

## 2023-09-26 NOTE — Telephone Encounter (Signed)
The cream you prescribed is going to cost almost $100.00.  Is there something else that can be prescribed?

## 2023-10-01 ENCOUNTER — Other Ambulatory Visit: Payer: Self-pay

## 2023-10-01 MED ORDER — CLOTRIMAZOLE-BETAMETHASONE 1-0.05 % EX CREA: 1.0000 | TOPICAL_CREAM | Freq: Every day | CUTANEOUS | 0 refills | Status: DC

## 2023-10-09 ENCOUNTER — Ambulatory Visit: Payer: Medicare PPO | Admitting: Podiatry

## 2023-11-22 ENCOUNTER — Other Ambulatory Visit: Payer: Self-pay | Admitting: Gastroenterology

## 2023-11-22 DIAGNOSIS — R748 Abnormal levels of other serum enzymes: Secondary | ICD-10-CM

## 2023-11-25 ENCOUNTER — Encounter: Payer: Self-pay | Admitting: Internal Medicine

## 2023-11-26 ENCOUNTER — Ambulatory Visit
Admission: RE | Admit: 2023-11-26 | Discharge: 2023-11-26 | Disposition: A | Discharge status: Home / Self Care | Payer: Medicare PPO | Source: Ambulatory Visit | Attending: Gastroenterology | Admitting: Gastroenterology

## 2023-11-26 DIAGNOSIS — R748 Abnormal levels of other serum enzymes: Secondary | ICD-10-CM | POA: Diagnosis present

## 2023-12-06 ENCOUNTER — Other Ambulatory Visit: Payer: Self-pay | Admitting: Internal Medicine

## 2023-12-09 ENCOUNTER — Encounter: Payer: Self-pay | Admitting: Internal Medicine

## 2023-12-09 NOTE — Telephone Encounter (Signed)
Component Ref Range & Units 2 mo ago  Potassium 3.6 - 5.1 mmol/L 3.8

## 2023-12-12 ENCOUNTER — Encounter: Payer: Self-pay | Admitting: Internal Medicine

## 2023-12-26 ENCOUNTER — Encounter: Payer: Self-pay | Admitting: *Deleted

## 2024-01-03 ENCOUNTER — Encounter: Payer: Self-pay | Admitting: *Deleted

## 2024-01-03 ENCOUNTER — Ambulatory Visit: Payer: Medicare PPO | Admitting: Anesthesiology

## 2024-01-03 ENCOUNTER — Ambulatory Visit
Admission: RE | Admit: 2024-01-03 | Discharge: 2024-01-03 | Disposition: A | Discharge status: Home / Self Care | Payer: Medicare PPO | Attending: Gastroenterology | Admitting: Gastroenterology

## 2024-01-03 ENCOUNTER — Encounter
Admission: RE | Disposition: A | Discharge status: Home / Self Care | Payer: Self-pay | Source: Home / Self Care | Attending: Gastroenterology

## 2024-01-03 DIAGNOSIS — K317 Polyp of stomach and duodenum: Secondary | ICD-10-CM | POA: Diagnosis not present

## 2024-01-03 DIAGNOSIS — K640 First degree hemorrhoids: Secondary | ICD-10-CM | POA: Insufficient documentation

## 2024-01-03 DIAGNOSIS — Z800 Family history of malignant neoplasm of digestive organs: Secondary | ICD-10-CM | POA: Insufficient documentation

## 2024-01-03 DIAGNOSIS — Z9049 Acquired absence of other specified parts of digestive tract: Secondary | ICD-10-CM | POA: Insufficient documentation

## 2024-01-03 DIAGNOSIS — Z8673 Personal history of transient ischemic attack (TIA), and cerebral infarction without residual deficits: Secondary | ICD-10-CM | POA: Insufficient documentation

## 2024-01-03 DIAGNOSIS — Z85028 Personal history of other malignant neoplasm of stomach: Secondary | ICD-10-CM | POA: Diagnosis not present

## 2024-01-03 DIAGNOSIS — F419 Anxiety disorder, unspecified: Secondary | ICD-10-CM | POA: Insufficient documentation

## 2024-01-03 DIAGNOSIS — Q438 Other specified congenital malformations of intestine: Secondary | ICD-10-CM | POA: Insufficient documentation

## 2024-01-03 DIAGNOSIS — Z1211 Encounter for screening for malignant neoplasm of colon: Secondary | ICD-10-CM | POA: Diagnosis present

## 2024-01-03 HISTORY — DX: Paroxysmal atrial fibrillation: I48.0

## 2024-01-03 HISTORY — DX: Fatty (change of) liver, not elsewhere classified: K76.0

## 2024-01-03 HISTORY — DX: Personal history of other diseases of the circulatory system: Z86.79

## 2024-01-03 HISTORY — DX: Malignant neoplasm of upper lobe, left bronchus or lung: C34.12

## 2024-01-03 HISTORY — DX: Calculus of kidney: N20.0

## 2024-01-03 HISTORY — DX: Unspecified osteoarthritis, unspecified site: M19.90

## 2024-01-03 HISTORY — DX: Allergic rhinitis, unspecified: J30.9

## 2024-01-03 HISTORY — DX: Dyspnea, unspecified: R06.00

## 2024-01-03 HISTORY — DX: Cerebral infarction, unspecified: I63.9

## 2024-01-03 HISTORY — DX: Irritable bowel syndrome, unspecified: K58.9

## 2024-01-03 HISTORY — DX: Postmenopausal bleeding: N95.0

## 2024-01-03 HISTORY — DX: Other specified disorders of bone density and structure, multiple sites: M85.89

## 2024-01-03 HISTORY — DX: Palpitations: R00.2

## 2024-01-03 HISTORY — PX: ESOPHAGOGASTRODUODENOSCOPY (EGD) WITH PROPOFOL: SHX5813

## 2024-01-03 HISTORY — DX: Edema, unspecified: R60.9

## 2024-01-03 HISTORY — PX: COLONOSCOPY WITH PROPOFOL: SHX5780

## 2024-01-03 SURGERY — COLONOSCOPY WITH PROPOFOL
Anesthesia: General

## 2024-01-03 MED ORDER — PROPOFOL 10 MG/ML IV BOLUS
INTRAVENOUS | Status: DC | PRN
  Administered 2024-01-03: 10 mg via INTRAVENOUS
  Administered 2024-01-03 (×2): 20 mg via INTRAVENOUS
  Administered 2024-01-03: 40 mg via INTRAVENOUS

## 2024-01-03 MED ORDER — GLYCOPYRROLATE 0.2 MG/ML IJ SOLN
INTRAMUSCULAR | Status: AC
  Filled 2024-01-03: qty 1

## 2024-01-03 MED ORDER — PHENYLEPHRINE 80 MCG/ML (10ML) SYRINGE FOR IV PUSH (FOR BLOOD PRESSURE SUPPORT)
PREFILLED_SYRINGE | INTRAVENOUS | Status: AC
  Filled 2024-01-03: qty 10

## 2024-01-03 MED ORDER — DEXMEDETOMIDINE HCL IN NACL 80 MCG/20ML IV SOLN
INTRAVENOUS | Status: DC | PRN
  Administered 2024-01-03: 20 ug via INTRAVENOUS

## 2024-01-03 MED ORDER — LIDOCAINE HCL (PF) 2 % IJ SOLN
INTRAMUSCULAR | Status: AC
  Filled 2024-01-03: qty 5

## 2024-01-03 MED ORDER — EPHEDRINE SULFATE-NACL 50-0.9 MG/10ML-% IV SOSY
PREFILLED_SYRINGE | INTRAVENOUS | Status: DC | PRN
  Administered 2024-01-03: 15 mg via INTRAVENOUS
  Administered 2024-01-03: 10 mg via INTRAVENOUS

## 2024-01-03 MED ORDER — LIDOCAINE HCL (CARDIAC) PF 100 MG/5ML IV SOSY
PREFILLED_SYRINGE | INTRAVENOUS | Status: DC | PRN
  Administered 2024-01-03: 60 mg via INTRAVENOUS

## 2024-01-03 MED ORDER — GLYCOPYRROLATE 0.2 MG/ML IJ SOLN
INTRAMUSCULAR | Status: DC | PRN
  Administered 2024-01-03: .2 mg via INTRAVENOUS

## 2024-01-03 MED ORDER — PROPOFOL 500 MG/50ML IV EMUL
INTRAVENOUS | Status: DC | PRN
  Administered 2024-01-03: 50 ug/kg/min via INTRAVENOUS

## 2024-01-03 MED ORDER — SODIUM CHLORIDE 0.9 % IV SOLN: INTRAVENOUS | Status: DC

## 2024-01-03 MED ORDER — PROPOFOL 1000 MG/100ML IV EMUL
INTRAVENOUS | Status: AC
  Filled 2024-01-03: qty 100

## 2024-01-03 MED ORDER — PHENYLEPHRINE 80 MCG/ML (10ML) SYRINGE FOR IV PUSH (FOR BLOOD PRESSURE SUPPORT)
PREFILLED_SYRINGE | INTRAVENOUS | Status: DC | PRN
  Administered 2024-01-03 (×2): 160 ug via INTRAVENOUS

## 2024-01-03 NOTE — Interval H&P Note (Signed)
History and Physical Interval Note:  01/03/2024 10:10 AM  Julie Jennings  has presented today for surgery, with the diagnosis of fattyliver,family hx of colon cancer.Diffuse large B cell lymphoma.  The various methods of treatment have been discussed with the patient and family. After consideration of risks, benefits and other options for treatment, the patient has consented to  Procedure(s): COLONOSCOPY WITH PROPOFOL (N/A) ESOPHAGOGASTRODUODENOSCOPY (EGD) WITH PROPOFOL (N/A) as a surgical intervention.  The patient's history has been reviewed, patient examined, no change in status, stable for surgery.  I have reviewed the patient's chart and labs.  Questions were answered to the patient's satisfaction.     Regis Bill  Ok to proceed with EGD/Colonoscopy

## 2024-01-03 NOTE — Op Note (Signed)
[facility] Gastroenterology Patient Name: Julie Jennings Procedure Date: 01/03/2024 9:58 AM MRN: [id number] Account #: [id number] Date of Birth: [birth year] Admit Type: Outpatient Age: 73 Room: [facility] ENDO ROOM 3 Gender: Female Note Status: Finalized Instrument Name: Patton Salles Endoscope [id number] Procedure:             Upper GI endoscopy Indications:           Personal history of malignant gastric neoplasm Providers:             Eather Colas MD, MD Referring MD:          Duane Lope. Judithann Sheen, MD (Referring MD) Medicines:             Monitored Anesthesia Care Complications:         No immediate complications. Procedure:             Pre-Anesthesia Assessment:                        - Prior to the procedure, a History and Physical was                         performed, and patient medications and allergies were                         reviewed. The patient is competent. The risks and                         benefits of the procedure and the sedation options and                         risks were discussed with the patient. All questions                         were answered and informed consent was obtained.                         Patient identification and proposed procedure were                         verified by the physician, the nurse, the                         anesthesiologist, the anesthetist and the technician                         in the endoscopy suite. Mental Status Examination:                         alert and oriented. Airway Examination: normal                         oropharyngeal airway and neck mobility. Respiratory                         Examination: clear to auscultation. CV Examination:                         normal. Prophylactic Antibiotics: The patient does not  require prophylactic antibiotics. Prior                         Anticoagulants: The patient has taken no anticoagulant                         or antiplatelet  agents. ASA Grade Assessment: III - A                         patient with severe systemic disease. After reviewing                         the risks and benefits, the patient was deemed in                         satisfactory condition to undergo the procedure. The                         anesthesia plan was to use monitored anesthesia care                         (MAC). Immediately prior to administration of                         medications, the patient was re-assessed for adequacy                         to receive sedatives. The heart rate, respiratory                         rate, oxygen saturations, blood pressure, adequacy of                         pulmonary ventilation, and response to care were                         monitored throughout the procedure. The physical                         status of the patient was re-assessed after the                         procedure.                        After obtaining informed consent, the endoscope was                         passed under direct vision. Throughout the procedure,                         the patient's blood pressure, pulse, and oxygen                         saturations were monitored continuously. The Endoscope                         was introduced through the mouth, and advanced to the  second part of duodenum. The upper GI endoscopy was                         accomplished without difficulty. The patient tolerated                         the procedure well. Findings:      The examined esophagus was normal.      Multiple sessile fundic gland polyps with no bleeding and no stigmata of       recent bleeding were found in the gastric fundus and in the gastric body.      The exam of the stomach was otherwise normal.      The examined duodenum was normal. Impression:            - Normal esophagus.                        - Multiple fundic gland polyps.                        - Normal examined  duodenum.                        - No specimens collected. Recommendation:        - Discharge patient to home.                        - Resume previous diet.                        - Continue present medications.                        - Return to referring physician as previously                         scheduled. Procedure Code(s):     --- Professional ---                        [phone], Esophagogastroduodenoscopy, flexible,                         transoral; diagnostic, including collection of                         specimen(s) by brushing or washing, when performed                         (separate procedure) Diagnosis Code(s):     --- Professional ---                        K31.7, Polyp of stomach and duodenum                        Z85.028, Personal history of other malignant neoplasm                         of stomach CPT copyright 2022 [employer]. All rights reserved. The codes documented in this report are preliminary and upon coder review may  be revised to meet current compliance  requirements. Eather Colas MD, MD 01/03/2024 10:50:20 AM Number of Addenda: 0 Note Initiated On: 01/03/2024 9:58 AM Estimated Blood Loss:  Estimated blood loss: none.      [facility]

## 2024-01-03 NOTE — H&P (Signed)
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 There's some question of cirrhosis but normal platelets and liver enzymes. History of difficult colonoscopies. History of cholecystectomy. No blood thinners.    Current Facility-Administered Medications:    0.9 %  sodium chloride infusion, , Intravenous, Continuous, Elysa Womac, Rossie Muskrat, MD, Last Rate: 20 mL/hr at 01/03/24 0941, New Bag at 01/03/24 0941  Facility-Administered Medications Ordered in Other Encounters:    sodium chloride flush (NS) 0.9 % injection 10 mL, 10 mL, Intravenous, PRN, Louretta Shorten R, MD, 10 mL at 01/22/23 1452  Medications Prior to Admission  Medication Sig Dispense Refill Last Dose/Taking   losartan (COZAAR) 100 MG tablet Take 100 mg by mouth daily.   01/02/2024   nebivolol (BYSTOLIC) 10 MG tablet Take 10 mg by mouth daily.   01/02/2024   venlafaxine XR (EFFEXOR-XR) 75 MG 24 hr capsule Take 3 capsules (225 mg total) by mouth daily with breakfast. 90 capsule 1 01/03/2024 at  7:15 AM   ALPRAZolam (XANAX) 0.5 MG tablet Take 1 tablet (0.5 mg total) by mouth 3 (three) times daily. 5 tablet 0    amoxicillin-clavulanate (AUGMENTIN) 875-125 MG tablet Take 1 tablet by mouth every 12 (twelve) hours. 14 tablet 0    ARIPiprazole (ABILIFY) 2 MG tablet Take 2 mg by mouth daily.      aspirin 81 MG chewable tablet Chew by mouth.      benzonatate (TESSALON) 200 MG capsule Take by mouth.      buPROPion (WELLBUTRIN XL) 150 MG 24 hr tablet Take by mouth.      clotrimazole-betamethasone (LOTRISONE) cream Apply 1 Application topically daily. 45 each 0    furosemide (LASIX) 40 MG tablet Take 40 mg by mouth daily.      lidocaine-prilocaine (EMLA) cream Apply  on the port. 30 -45 min  prior to port access. 30 g 3    meloxicam (MOBIC) 15 MG tablet Take by mouth.      Naftifine HCl (NAFTIN) 2 % CREA Apply 1 Application topically 1 day or 1 dose. 45 g 2    pantoprazole (PROTONIX) 40 MG tablet Take 40 mg by mouth 2 (two) times daily.      Polyethyl Glycol-Propyl Glycol (SYSTANE) 0.4-0.3 % SOLN Place 1 drop into both eyes daily as needed (Dry eye).      potassium chloride SA (KLOR-CON M) 20 MEQ tablet One pill a day. 30 tablet 0    predniSONE (DELTASONE) 20 MG tablet Take 2 tablets (40 mg total) by mouth daily. 10 tablet 0    prochlorperazine (COMPAZINE) 10 MG tablet Take 1 tablet (10 mg total) by mouth every 6 (six) hours as needed for nausea or vomiting. 40 tablet 0    QUEtiapine (SEROQUEL) 25 MG tablet Take 1 tablet (25 mg total) by mouth at bedtime. 90 tablet 0    tiotropium (SPIRIVA) 18 MCG inhalation capsule Place 18 mcg into inhaler and inhale daily.      triamcinolone (NASACORT) 55 MCG/ACT AERO nasal inhaler Place 2 sprays into the nose daily.        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)    Chronic kidney disease    history nephrolithiasis   CVA (cerebral vascular accident) (HCC)    Depression    Dyspnea    Dysrhythmia    PSVT   Edema    Endometriosis    Hepatic steatosis    Herpes zoster    History of kidney stones    History of PSVT (paroxysmal supraventricular tachycardia)    Irritable bowel syndrome    Lung cancer (HCC) left   Lymphoma (HCC)    "stomach"   Nephrolithiasis    Osteoarthritis    Osteopenia of multiple sites    Palpitations     Paroxysmal atrial fibrillation (HCC)    Post-menopausal bleeding    Stroke [facility])     Review of systems:  Otherwise negative.    Physical Exam  Gen: Alert, oriented. Appears stated age.  HEENT: PERRLA. Lungs: No respiratory distress CV: RRR Abd: soft, benign, no masses Ext: No edema    Planned procedures: Proceed with EGD/colonoscopy. The patient understands the nature of the planned procedure, indications, risks, alternatives and potential complications including but not limited to bleeding, infection, perforation, damage to internal organs and possible oversedation/side effects from anesthesia. The patient agrees and gives consent to proceed.  Please refer to procedure notes for findings, recommendations and patient disposition/instructions.     Regis Bill, MD [facility] Gastroenter[facility]

## 2024-01-03 NOTE — Anesthesia Postprocedure Evaluation (Signed)
Anesthesia Post Note  Patient: Piera Delcarlo  Procedure(s) Performed: COLONOSCOPY WITH PROPOFOL ESOPHAGOGASTRODUODENOSCOPY (EGD) WITH PROPOFOL  Patient location during evaluation: PACU Anesthesia Type: General Level of consciousness: awake Pain management: pain level controlled Vital Signs Assessment: post-procedure vital signs reviewed and stable Respiratory status: spontaneous breathing Cardiovascular status: stable Anesthetic complications: no   No notable events documented.   Last Vitals:  Vitals:   01/03/24 1105 01/03/24 1110  BP: 102/71 102/71  Pulse: 67 71  Resp: 18 (!) 23  Temp:    SpO2: (!) 76% 100%    Last Pain:  Vitals:   01/03/24 1100  TempSrc:   PainSc: 0-No pain                 VAN STAVEREN,Tavaris Eudy

## 2024-01-03 NOTE — Anesthesia Preprocedure Evaluation (Addendum)
Anesthesia Evaluation  Patient identified by MRN, date of birth, ID band Patient awake    Reviewed: Allergy & Precautions, NPO status , Patient's Chart, lab work & pertinent test results  Airway Mallampati: III  TM Distance: <3 FB Neck ROM: full  Mouth opening: Limited Mouth Opening  Dental  (+) Teeth Intact   Pulmonary neg pulmonary ROS, shortness of breath   Pulmonary exam normal        Cardiovascular Exercise Tolerance: Good hypertension, Pt. on medications negative cardio ROS Normal cardiovascular exam+ dysrhythmias  Rhythm:Regular Rate:Normal     Neuro/Psych   Anxiety     CVA negative neurological ROS  negative psych ROS   GI/Hepatic negative GI ROS, Neg liver ROS,,,  Endo/Other  negative endocrine ROS    Renal/GU negative Renal ROS  negative genitourinary   Musculoskeletal   Abdominal  (+) + obese  Peds negative pediatric ROS (+)  Hematology negative hematology ROS (+)   Anesthesia Other Findings Past Medical History: No date: A-fib [facility])     Comment:  Only once No date: Allergic rhinitis No date: Anxiety No date: Arthritis     Comment:  oesteoarthritis 04/16/2014: BP (high blood pressure) No date: Cancer of upper lobe of left lung (HCC) No date: Chronic kidney disease     Comment:  history nephrolithiasis No date: CVA (cerebral vascular accident) (HCC) No date: Depression No date: Dyspnea No date: Dysrhythmia     Comment:  PSVT No date: Edema No date: Endometriosis No date: Hepatic steatosis No date: Herpes zoster No date: History of kidney stones No date: History of PSVT (paroxysmal supraventricular tachycardia) No date: Irritable bowel syndrome left: Lung cancer (HCC) No date: Lymphoma (HCC)     Comment:  "stomach" No date: Nephrolithiasis No date: Osteoarthritis No date: Osteopenia of multiple sites No date: Palpitations No date: Paroxysmal atrial fibrillation (HCC) No date:  Post-menopausal bleeding No date: Stroke [facility])  Past Surgical History: No date: AUGMENTATION MAMMAPLASTY; Bilateral No date: CHOLECYSTECTOMY No date: COLONOSCOPY 04/14/2018: COLONOSCOPY WITH PROPOFOL; N/A     Comment:  Procedure: COLONOSCOPY WITH PROPOFOL;  Surgeon: Scot Jun, MD;  Location[facility] ENDOSCOPY;  Service:               Endoscopy;  Laterality: N/A; No date: DIAGNOSTIC LAPAROSCOPY No date: DILATION AND CURETTAGE OF UTERUS No date: FRACTURE SURGERY No date: HEMORRHOIDECTOMY WITH HEMORRHOID BANDING No date: HERNIA REPAIR     Comment:  umbilical hernia 05/04/2022: IR IMAGING GUIDED PORT INSERTION 09/04/2023: IR REMOVAL TUN ACCESS W/ PORT W/O FL MOD SED No date: LUNG REMOVAL, PARTIAL; Left 01/04/2022: REVERSE SHOULDER ARTHROPLASTY; Left     Comment:  Procedure: Left reverse shoulder arthroplasty, biceps               tenodesis;  Surgeon: Signa Kell, MD;  Location: [facility]               ORS;  Service: Orthopedics;  Laterality: Left;  BMI    Body Mass Index: 28.66 kg/m      Reproductive/Obstetrics negative OB ROS                             Anesthesia Physical Anesthesia Plan  ASA: 3  Anesthesia Plan: General   Post-op Pain Management:    Induction: Intravenous  PONV Risk Score and Plan: Propofol infusion and TIVA  Airway Management Planned:  Natural Airway and Nasal Cannula  Additional Equipment:   Intra-op Plan:   Post-operative Plan:   Informed Consent: I have reviewed the patients History and Physical, chart, labs and discussed the procedure including the risks, benefits and alternatives for the proposed anesthesia with the patient or authorized representative who has indicated his/her understanding and acceptance.     Dental Advisory Given  Plan Discussed with: CRNA and Surgeon  Anesthesia Plan Comments:        Anesthesia Quick Evaluation

## 2024-01-03 NOTE — Op Note (Signed)
[facility] Gastroenterology Patient Name: Julie Jennings Procedure Date: 01/03/2024 9:52 AM MRN: [id number] Account #: [id number] Date of Birth: 09/03/1951 Admit Type: Outpatient Age: 73 Room: [facility] ENDO ROOM 3 Gender: Female Note Status: Finalized Instrument Name: Prentice Docker [id number] Procedure:             Colonoscopy Indications:           High risk colon cancer surveillance: Personal history                         of colonic polyps, Family history of colon cancer in a                         first-degree relative before age 45 years Providers:             Eather Colas MD, MD Referring MD:          Duane Lope. Judithann Sheen, MD (Referring MD) Medicines:             Monitored Anesthesia Care Complications:         No immediate complications. Procedure:             Pre-Anesthesia Assessment:                        - Prior to the procedure, a History and Physical was                         performed, and patient medications and allergies were                         reviewed. The patient is competent. The risks and                         benefits of the procedure and the sedation options and                         risks were discussed with the patient. All questions                         were answered and informed consent was obtained.                         Patient identification and proposed procedure were                         verified by the physician, the nurse, the                         anesthesiologist, the anesthetist and the technician                         in the endoscopy suite. Mental Status Examination:                         alert and oriented. Airway Examination: normal                         oropharyngeal airway and neck mobility. Respiratory  Examination: clear to auscultation. CV Examination:                         normal. Prophylactic Antibiotics: The patient does not                         require prophylactic  antibiotics. Prior                         Anticoagulants: The patient has taken no anticoagulant                         or antiplatelet agents. ASA Grade Assessment: III - A                         patient with severe systemic disease. After reviewing                         the risks and benefits, the patient was deemed in                         satisfactory condition to undergo the procedure. The                         anesthesia plan was to use monitored anesthesia care                         (MAC). Immediately prior to administration of                         medications, the patient was re-assessed for adequacy                         to receive sedatives. The heart rate, respiratory                         rate, oxygen saturations, blood pressure, adequacy of                         pulmonary ventilation, and response to care were                         monitored throughout the procedure. The physical                         status of the patient was re-assessed after the                         procedure.                        After obtaining informed consent, the colonoscope was                         passed under direct vision. Throughout the procedure,                         the patient's blood pressure, pulse, and oxygen  saturations were monitored continuously. The                         Colonoscope was introduced through the anus with the                         intention of advancing to the cecum. The scope was                         advanced to the ascending colon before the procedure                         was aborted. Medications were given. The colonoscopy                         was aborted due to significant looping and a tortuous                         colon. Changing the patient's position and applying                         abdominal pressure did not allow for the successful                         completion of the procedure. The  patient tolerated the                         procedure well. The quality of the bowel preparation                         was good. The ileocecal valve and the rectum were                         photographed. Findings:      The perianal and digital rectal examinations were normal.      Internal hemorrhoids were found during retroflexion. The hemorrhoids       were Grade I (internal hemorrhoids that do not prolapse).      The exam was otherwise without abnormality on direct and retroflexion       views. Impression:            - The procedure was aborted due to significant looping                         and a tortuous colon.                        - Internal hemorrhoids.                        - The examination was otherwise normal on direct and                         retroflexion views.                        - No specimens collected. Recommendation:        - Discharge patient to home.                        -  Resume previous diet.                        - Continue present medications.                        - Perform a virtual colonoscopy at appointment to be                         scheduled.                        - Return to referring physician as previously                         scheduled. Procedure Code(s):     --- Professional ---                        Z6109, 53, Colorectal cancer screening; colonoscopy on                         individual at high risk Diagnosis Code(s):     --- Professional ---                        Z86.010, Personal history of colonic polyps                        K64.0, First degree hemorrhoids                        Z80.0, Family history of malignant neoplasm of                         digestive organs CPT copyright 2022 [employer]. All rights reserved. The codes documented in this report are preliminary and upon coder review may  be revised to meet current compliance requirements. Eather Colas MD, MD 01/03/2024 10:54:13  AM Number of Addenda: 0 Note Initiated On: 01/03/2024 9:52 AM Total Procedure Duration: 0 hours 22 minutes 4 seconds  Estimated Blood Loss:  Estimated blood loss: none.      [facility]

## 2024-01-03 NOTE — Transfer of Care (Signed)
Immediate Anesthesia Transfer of Care Note  Patient: Julie Jennings  Procedure(s) Performed: COLONOSCOPY WITH PROPOFOL ESOPHAGOGASTRODUODENOSCOPY (EGD) WITH PROPOFOL  Patient Location: PACU  Anesthesia Type:General  Level of Consciousness: sedated  Airway & Oxygen Therapy: Patient Spontanous Breathing  Post-op Assessment: Report given to RN and Post -op Vital signs reviewed and stable  Post vital signs: Reviewed and stable  Last Vitals:  Vitals Value Taken Time  BP 84/48 01/03/24 1050  Temp 35.8 C 01/03/24 1050  Pulse 83 01/03/24 1051  Resp 17 01/03/24 1051  SpO2 100 % 01/03/24 1051  Vitals shown include unfiled device data.  Last Pain:  Vitals:   01/03/24 1050  TempSrc:   PainSc: 0-No pain         Complications: No notable events documented.

## 2024-01-06 ENCOUNTER — Encounter: Payer: Self-pay | Admitting: Gastroenterology

## 2024-01-23 ENCOUNTER — Other Ambulatory Visit: Payer: Self-pay | Admitting: Gastroenterology

## 2024-01-23 DIAGNOSIS — Z860100 Personal history of colon polyps, unspecified: Secondary | ICD-10-CM

## 2024-02-20 ENCOUNTER — Other Ambulatory Visit: Payer: Medicare PPO

## 2024-03-13 ENCOUNTER — Inpatient Hospital Stay: Payer: Medicare PPO | Admitting: Internal Medicine

## 2024-03-13 ENCOUNTER — Encounter: Payer: Self-pay | Admitting: Internal Medicine

## 2024-03-13 ENCOUNTER — Inpatient Hospital Stay: Payer: Medicare PPO | Attending: Internal Medicine

## 2024-03-13 DIAGNOSIS — F32A Depression, unspecified: Secondary | ICD-10-CM | POA: Diagnosis not present

## 2024-03-13 DIAGNOSIS — Z79899 Other long term (current) drug therapy: Secondary | ICD-10-CM | POA: Diagnosis not present

## 2024-03-13 DIAGNOSIS — Z85118 Personal history of other malignant neoplasm of bronchus and lung: Secondary | ICD-10-CM | POA: Insufficient documentation

## 2024-03-13 DIAGNOSIS — Z800 Family history of malignant neoplasm of digestive organs: Secondary | ICD-10-CM | POA: Insufficient documentation

## 2024-03-13 DIAGNOSIS — C8331 Diffuse large B-cell lymphoma, lymph nodes of head, face, and neck: Secondary | ICD-10-CM

## 2024-03-13 DIAGNOSIS — Z8042 Family history of malignant neoplasm of prostate: Secondary | ICD-10-CM | POA: Diagnosis not present

## 2024-03-13 DIAGNOSIS — E876 Hypokalemia: Secondary | ICD-10-CM | POA: Diagnosis not present

## 2024-03-13 DIAGNOSIS — Z923 Personal history of irradiation: Secondary | ICD-10-CM | POA: Diagnosis not present

## 2024-03-13 DIAGNOSIS — F419 Anxiety disorder, unspecified: Secondary | ICD-10-CM | POA: Diagnosis not present

## 2024-03-13 DIAGNOSIS — R682 Dry mouth, unspecified: Secondary | ICD-10-CM | POA: Diagnosis not present

## 2024-03-13 DIAGNOSIS — I4891 Unspecified atrial fibrillation: Secondary | ICD-10-CM | POA: Diagnosis not present

## 2024-03-13 DIAGNOSIS — I7121 Aneurysm of the ascending aorta, without rupture: Secondary | ICD-10-CM | POA: Diagnosis not present

## 2024-03-13 DIAGNOSIS — K760 Fatty (change of) liver, not elsewhere classified: Secondary | ICD-10-CM | POA: Diagnosis not present

## 2024-03-13 LAB — CBC WITH DIFFERENTIAL (CANCER CENTER ONLY)
Abs Immature Granulocytes: 0.01 10*3/uL (ref 0.00–0.07)
Basophils Absolute: 0.1 10*3/uL (ref 0.0–0.1)
Basophils Relative: 1 %
Eosinophils Absolute: 0.1 10*3/uL (ref 0.0–0.5)
Eosinophils Relative: 2 %
HCT: 38.4 % (ref 36.0–46.0)
Hemoglobin: 12.7 g/dL (ref 12.0–15.0)
Immature Granulocytes: 0 %
Lymphocytes Relative: 20 %
Lymphs Abs: 1 10*3/uL (ref 0.7–4.0)
MCH: 31.9 pg (ref 26.0–34.0)
MCHC: 33.1 g/dL (ref 30.0–36.0)
MCV: 96.5 fL (ref 80.0–100.0)
Monocytes Absolute: 0.3 10*3/uL (ref 0.1–1.0)
Monocytes Relative: 6 %
Neutro Abs: 3.6 10*3/uL (ref 1.7–7.7)
Neutrophils Relative %: 71 %
Platelet Count: 191 10*3/uL (ref 150–400)
RBC: 3.98 MIL/uL (ref 3.87–5.11)
RDW: 14.5 % (ref 11.5–15.5)
WBC Count: 5.2 10*3/uL (ref 4.0–10.5)
nRBC: 0 % (ref 0.0–0.2)

## 2024-03-13 LAB — CMP (CANCER CENTER ONLY)
ALT: 39 U/L (ref 0–44)
AST: 49 U/L — ABNORMAL HIGH (ref 15–41)
Albumin: 4 g/dL (ref 3.5–5.0)
Alkaline Phosphatase: 150 U/L — ABNORMAL HIGH (ref 38–126)
Anion gap: 9 (ref 5–15)
BUN: 19 mg/dL (ref 8–23)
CO2: 26 mmol/L (ref 22–32)
Calcium: 9.3 mg/dL (ref 8.9–10.3)
Chloride: 103 mmol/L (ref 98–111)
Creatinine: 0.85 mg/dL (ref 0.44–1.00)
GFR, Estimated: >60 mL/min (ref >60)
Glucose, Bld: 141 mg/dL — ABNORMAL HIGH (ref 70–99)
Potassium: 4.1 mmol/L (ref 3.5–5.1)
Sodium: 138 mmol/L (ref 135–145)
Total Bilirubin: 0.8 mg/dL (ref 0.0–1.2)
Total Protein: 7.1 g/dL (ref 6.5–8.1)

## 2024-03-13 LAB — MAGNESIUM: Magnesium: 1.7 mg/dL (ref 1.7–2.4)

## 2024-03-13 LAB — LACTATE DEHYDROGENASE: LDH: 160 U/L (ref 98–192)

## 2024-03-13 MED ORDER — POTASSIUM CHLORIDE CRYS ER 20 MEQ PO TBCR: EXTENDED_RELEASE_TABLET | ORAL | 3 refills | Status: AC

## 2024-03-13 NOTE — Assessment & Plan Note (Addendum)
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  Clinically stage II. Currently s/p R-CEOP chemotherapy every 3 weeks x 3 cycles-followed by involved field radiation.  Patient currently status post radiation-finished radiation on sep 7th, 2024- NOV 2023- incidentally- PET scan: Interval decrease and bilateral and symmetric increased uptake within the palatine tonsils; New mild increased radiotracer uptake within the pharyngeal tonsils is identified, left greater than right. S/p evaluation with Dr.Jeungle-negative for any recurrent malignancy. Stable.   # Dry mouth-sec to RT- continue sips of water.   # Anxiety/depression: On anxiolytic as per PCP [on quitapine; not on abilify].will not refill xanax/defer to PCP-   Stable.  # [PET scan- 2024] liver compatible with cirrhosis Slightly intermittent elevated LFts-?  Fatty liver.  Hepatitis panel normal; SEP 2024- [place] [[facility]]- NO mass lesion- Stable.  # Card" Hx of A.fib [Dr.Fath]- Stable 4 cm ascending thoracic aortic aneurysm. Recommend annual imaging followup by CTA or MRA.-defer to cardiology.  On lasix 40 mg/day- see below;   Stable.  # Electrolyte- mild hypokalemia/ hypomagnesia [sec to Lasix]- continue/recommend compliance Kdur once a day.refilled. -stable.  # IV access: port explanted- [sep 2024]- PIV  # DISPOSITION: # follow up in 6 months- MD; labs- Cbc/cmp/ldh;Mag Dr.B

## 2024-03-13 NOTE — Progress Notes (Signed)
 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 Biopsy is suggestive of fatty liver changes  # carcinoma of lung status post left upper lobe resection in July of 2014; Adenocarcinoma T1N0 M0 tumor EGFR positive  SURGICAL PATHOLOGY  CASE: [id number]  PATIENT: Julie Jennings  Surgical Pathology Report      Specimen Submitted:  A. Lymph node, left neck   Clinical History: New left cervical lymphadenopathy.  History of  lymphoma and lung cancer.  New lymphadenopathy    MAY 17th, 2023- [Dr.juengle]; A. LYMPH NODE, LEFT NECK; ULTRASOUND-GUIDED BIOPSY:  - FINDINGS COMPATIBLE WITH LARGE B-CELL LYMPHOMA.   Comment:  Core biopsy sections display lymphoid tissue with a somewhat effaced  immunoarchitecture.  Portions of nodal tissue are comprised of sheets of  small lymphocytes, while other areas display dense background fibrosis,  and aggregates of large abnormal lymphocytes with irregular nuclear  contours, open chromatin, visible nucleoli, and retraction artifact.   Immunohistochemical studies demonstrate diffuse positivity for CD20  within regions comprised of larger lymphocytes, compatible with B cells.  CD3 highlights a background of small T cells.  CK AE1/AE3 is negative  for metastatic carcinoma.  PAX5 displays dim, patchy marking of larger  abnormal B cells.  In addition, these B cells appear to display dim  expression of CD10, with  positivity for Bcl-2, BCL6, and Mum-1.  B cells  are negative for CD5.  CD30 displays increased marking.  C-Myc is  positive, staining greater than 40% of larger cells.  CD45 (LCA) is  diffusely positive, without aberrant loss of expression.   Concurrent flow cytometric studies demonstrate a CD10 positive  monoclonal B-cell population in a background of many polytypic B cells,  representing 3% of total viable lymphoid cells and 8% of B cells.  For  further details, see scanned report in [facility].   The patient's history is of diffuse large B-cell lymphoma, as well as  invasive adenocarcinoma of the lung are noted.  Biopsy sections  demonstrate an abnormal proliferation of large B cells, compatible with  involvement by a diffuse large B-cell lymphoma. Further  subclassification is difficult, secondary to limited tissue, however,  presence of C10 marking would suggest a germinal center immunophenotype.  In addition, there does appear to be expression of both Bcl-2 and c-myc,  which may suggest a more aggressive process. There is no evidence of  metastatic adenocarcinoma. FISH testing for prognostically significant  abnormalities of BCL2, BCL6, and MYC will be attempted, and reported as  an addendum.   IMPRESSION: 1. Enlarged hypermetabolic lymph nodes in the bilateral left greater than right neck and asymmetric right palatine tonsil hypermetabolism with associated soft tissue fullness on the CT images, all new since 2014 PET-CT, most compatible with recurrent lymphoma. Deauville category 5.  # MAY 12th, 2023- DLBCL- STAGE II [NO Bone marrow]; GCB; double expresser-mcy; Bcl-2; QNS-FISH.   # MAY 30th, 2023- RCEOP q 3 W x3-RT [finished radiation-sep 7th, 2023]   Cancer of upper lobe of  left lung (HCC)  Diffuse large B-cell lymphoma of lymph nodes of neck (HCC)  04/25/2022 Initial Diagnosis   Diffuse large B-cell lymphoma of lymph nodes of neck (HCC)   04/26/2022 Cancer Staging   Staging  form: Hodgkin and Non-Hodgkin Lymphoma, AJCC 8th Edition - Clinical: Stage II - Signed by Earna Coder, MD on 04/26/2022 Histopathologic type: Malignant lymphoma, large B-cell, diffuse, NOS   05/08/2022 - 06/22/2022 Chemotherapy   [name]      INTERVAL HISTORY: Patient is alone.   She is ambulating independently.  Julie Jennings 73 y.o.  female pleasant patient with extreme anxiety; and diffuse large B cell lymphoma [prior history of 2005 DLCBL] - status post R-CEOP cycle #3; also followed by radiation finished SEP 2023 is here for a follow up.   Patient C/o rash right leg, using hydrocortisone cream, no symptoms- of itching. Improved.   Appetite is good. Denies night sweats. With exertion and walking long distances she gets sweaty.(Hot flashes) NO pain. Bowels normal. Colonoscopy in 2 weeks   Chronic mild tremor.  Chronic dry mouth.   No weight loss. Patient denies any worsening enlargement of her lymph nodes.    Review of Systems  Constitutional:  Negative for chills, diaphoresis, fever, malaise/fatigue and weight loss.  HENT:  Negative for nosebleeds and sore throat.   Eyes:  Negative for double vision.  Respiratory:  Negative for cough, hemoptysis, sputum production, shortness of breath and wheezing.   Cardiovascular:  Negative for chest pain, palpitations, orthopnea and leg swelling.  Gastrointestinal:  Negative for abdominal pain, blood in stool, constipation, diarrhea, heartburn, melena, nausea and vomiting.  Musculoskeletal:  Positive for joint pain. Negative for back pain.  Skin: Negative.  Negative for itching and rash.  Neurological:  Negative for dizziness, tingling, focal weakness, weakness and headaches.  Endo/Heme/Allergies:  Does not bruise/bleed easily.  Psychiatric/Behavioral:  Negative for depression. The patient is nervous/anxious. The patient does not have insomnia.      PAST MEDICAL HISTORY  :  Past Medical History:  Diagnosis Date   A-fib [facility])    Only once   Allergic rhinitis    Anxiety    Arthritis    oesteoarthritis   BP (high blood pressure) 04/16/2014   Cancer of upper lobe of left lung (HCC)    Chronic kidney disease    history nephrolithiasis   CVA (cerebral vascular accident) (HCC)    Depression    Dyspnea    Dysrhythmia    PSVT   Edema    Endometriosis    Hepatic steatosis    Herpes zoster    History of kidney stones    History of PSVT (paroxysmal supraventricular tachycardia)    Irritable bowel syndrome    Lung cancer (HCC) left   Lymphoma (HCC)    "stomach"   Nephrolithiasis    Osteoarthritis    Osteopenia of multiple sites    Palpitations    Paroxysmal atrial fibrillation (HCC)    Post-menopausal bleeding    Stroke (HCC)     PAST SURGICAL HISTORY :   Past Surgical History:  Procedure Laterality Date   AUGMENTATION MAMMAPLASTY Bilateral    CHOLECYSTECTOMY     COLONOSCOPY     COLONOSCOPY WITH PROPOFOL N/A 04/14/2018   Procedure: COLONOSCOPY WITH PROPOFOL;  Surgeon: Scot Jun, MD;  Location: [facility] ENDOSCOPY;  Service: Endoscopy;  Laterality: N/A;   COLONOSCOPY WITH PROPOFOL N/A 01/03/2024   Procedure: COLONOSCOPY  WITH PROPOFOL;  Surgeon: Regis Bill, MD;  Location: [facility] ENDOSCOPY;  Service: Endoscopy;  Laterality: N/A;   DIAGNOSTIC LAPAROSCOPY     DILATION AND CURETTAGE OF UTERUS     ESOPHAGOGASTRODUODENOSCOPY (EGD) WITH PROPOFOL N/A 01/03/2024   Procedure: ESOPHAGOGASTRODUODENOSCOPY (EGD) WITH PROPOFOL;  Surgeon: Regis Bill, MD;  Location: [facility] ENDOSCOPY;  Service: Endoscopy;  Laterality: N/A;   FRACTURE SURGERY     HEMORRHOIDECTOMY WITH HEMORRHOID BANDING     HERNIA REPAIR     umbilical hernia   IR IMAGING GUIDED PORT INSERTION  05/04/2022   IR REMOVAL TUN ACCESS W/ PORT W/O FL MOD SED  09/04/2023   LUNG REMOVAL, PARTIAL Left    REVERSE SHOULDER ARTHROPLASTY Left 01/04/2022   Procedure: Left reverse shoulder  arthroplasty, biceps tenodesis;  Surgeon: Signa Kell, MD;  Location: [facility] ORS;  Service: Orthopedics;  Laterality: Left;    FAMILY HISTORY :   Family History  Problem Relation Age of Onset   Stroke Mother    Diabetes Mother    Colon cancer Father    Prostate cancer Father    Breast cancer Neg Hx     SOCIAL HISTORY:   Social History   Tobacco Use   Smoking status: Never   Smokeless tobacco: Never  Vaping Use   Vaping status: Never Used  Substance Use Topics   Alcohol use: No   Drug use: No    ALLERGIES:  is allergic to tylenol [acetaminophen], buspirone, and codeine.  MEDICATIONS:  Current Outpatient Medications  Medication Sig Dispense Refill   ALPRAZolam (XANAX) 0.5 MG tablet Take 1 tablet (0.5 mg total) by mouth 3 (three) times daily. 5 tablet 0   aspirin 81 MG chewable tablet Chew by mouth.     buPROPion (WELLBUTRIN XL) 150 MG 24 hr tablet Take by mouth.     losartan (COZAAR) 100 MG tablet Take 100 mg by mouth daily.     meloxicam (MOBIC) 15 MG tablet Take by mouth.     nebivolol (BYSTOLIC) 10 MG tablet Take 10 mg by mouth daily.     Polyethyl Glycol-Propyl Glycol (SYSTANE) 0.4-0.3 % SOLN Place 1 drop into both eyes daily as needed (Dry eye).     triamcinolone (NASACORT) 55 MCG/ACT AERO nasal inhaler Place 2 sprays into the nose daily.     venlafaxine XR (EFFEXOR-XR) 75 MG 24 hr capsule Take 3 capsules (225 mg total) by mouth daily with breakfast. 90 capsule 1   ARIPiprazole (ABILIFY) 2 MG tablet Take 2 mg by mouth daily.     clotrimazole-betamethasone (LOTRISONE) cream Apply 1 Application topically daily. (Patient not taking: Reported on 03/13/2024) 45 each 0   furosemide (LASIX) 40 MG tablet Take 40 mg by mouth daily.     pantoprazole (PROTONIX) 40 MG tablet Take 40 mg by mouth 2 (two) times daily.     potassium chloride SA (KLOR-CON M) 20 MEQ tablet One pill a day. 30 tablet 3   QUEtiapine (SEROQUEL) 25 MG tablet Take 1 tablet (25 mg total) by mouth at bedtime. 90  tablet 0   tiotropium (SPIRIVA) 18 MCG inhalation capsule Place 18 mcg into inhaler and inhale daily. (Patient not taking: Reported on 03/13/2024)     No current facility-administered medications for this visit.   Facility-Administered Medications Ordered in Other Visits  Medication Dose Route Frequency Provider Last Rate Last Admin   sodium chloride flush (NS) 0.9 % injection 10 mL  10 mL Intravenous PRN Earna Coder, MD   10 mL  at 01/22/23 1452    PHYSICAL EXAMINATION: ECOG PERFORMANCE STATUS: 0 - Asymptomatic  BP 113/87 (BP Location: Right Arm, Patient Position: Sitting, Cuff Size: Normal)   Pulse 84   Temp (!) 95.8 F (35.4 C) (Tympanic)   Resp (!) 24   Ht 5\' 7"  (1.702 m)   Wt 189 lb 9.6 oz (86 kg)   SpO2 99%   BMI 29.70 kg/m   Filed Weights   03/13/24 1051  Weight: 189 lb 9.6 oz (86 kg)   No Lymphadenopathy.   Physical Exam HENT:     Head: Normocephalic and atraumatic.     Mouth/Throat:     Pharynx: No oropharyngeal exudate.  Eyes:     Pupils: Pupils are equal, round, and reactive to light.  Cardiovascular:     Rate and Rhythm: Normal rate and regular rhythm.  Pulmonary:     Effort: Pulmonary effort is normal. No respiratory distress.     Breath sounds: Normal breath sounds. No wheezing.  Abdominal:     General: Bowel sounds are normal. There is no distension.     Palpations: Abdomen is soft. There is no mass.     Tenderness: There is no abdominal tenderness. There is no guarding or rebound.  Musculoskeletal:        General: No tenderness. Normal range of motion.     Cervical back: Normal range of motion and neck supple.  Skin:    General: Skin is warm.  Neurological:     Mental Status: She is alert and oriented to person, place, and time.  Psychiatric:        Mood and Affect: Affect normal.      LABORATORY DATA:  I have reviewed the data as listed    Component Value Date/Time   NA 138 03/13/2024 1042   NA 139 03/14/2015 0856   K 4.1  03/13/2024 1042   K 4.1 03/14/2015 0856   CL 103 03/13/2024 1042   CL 103 03/14/2015 0856   CO2 26 03/13/2024 1042   CO2 28 03/14/2015 0856   GLUCOSE 141 (H) 03/13/2024 1042   GLUCOSE 111 (H) 03/14/2015 0856   BUN 19 03/13/2024 1042   BUN 19 03/14/2015 0856   CREATININE 0.85 03/13/2024 1042   CREATININE 0.56 03/14/2015 0856   CALCIUM 9.3 03/13/2024 1042   CALCIUM 9.3 03/14/2015 0856   PROT 7.1 03/13/2024 1042   PROT 7.7 03/14/2015 0856   ALBUMIN 4.0 03/13/2024 1042   ALBUMIN 4.3 03/14/2015 0856   AST 49 (H) 03/13/2024 1042   ALT 39 03/13/2024 1042   ALT 87 (H) 03/14/2015 0856   ALKPHOS 150 (H) 03/13/2024 1042   ALKPHOS 164 (H) 03/14/2015 0856   BILITOT 0.8 03/13/2024 1042   GFRNONAA >60 03/13/2024 1042   GFRNONAA >60 03/14/2015 0856   GFRAA >60 07/07/2020 1041   GFRAA >60 03/14/2015 0856    No results found for: "SPEP", "UPEP"  Lab Results  Component Value Date   WBC 5.2 03/13/2024   NEUTROABS 3.6 03/13/2024   HGB 12.7 03/13/2024   HCT 38.4 03/13/2024   MCV 96.5 03/13/2024   PLT 191 03/13/2024      Chemistry      Component Value Date/Time   NA 138 03/13/2024 1042   NA 139 03/14/2015 0856   K 4.1 03/13/2024 1042   K 4.1 03/14/2015 0856   CL 103 03/13/2024 1042   CL 103 03/14/2015 0856   CO2 26 03/13/2024 1042   CO2 28 03/14/2015 0856  BUN 19 03/13/2024 1042   BUN 19 03/14/2015 0856   CREATININE 0.85 03/13/2024 1042   CREATININE 0.56 03/14/2015 0856      Component Value Date/Time   CALCIUM 9.3 03/13/2024 1042   CALCIUM 9.3 03/14/2015 0856   ALKPHOS 150 (H) 03/13/2024 1042   ALKPHOS 164 (H) 03/14/2015 0856   AST 49 (H) 03/13/2024 1042   ALT 39 03/13/2024 1042   ALT 87 (H) 03/14/2015 0856   BILITOT 0.8 03/13/2024 1042       RADIOGRAPHIC STUDIES: I have personally reviewed the radiological images as listed and agreed with the findings in the report. No results found.   ASSESSMENT & PLAN:  Diffuse large B-cell lymphoma of lymph nodes of neck  (HCC) #Diffuse B-cell lymphoma-bilateral neck left more than right PET scan.  Clinically stage II. Currently s/p R-CEOP chemotherapy every 3 weeks x 3 cycles-followed by involved field radiation.  Patient currently status post radiation-finished radiation on sep 7th, 2024- NOV 2023- incidentally- PET scan: Interval decrease and bilateral and symmetric increased uptake within the palatine tonsils; New mild increased radiotracer uptake within the pharyngeal tonsils is identified, left greater than right. S/p evaluation with Dr.Jeungle-negative for any recurrent malignancy. Stable.   # Dry mouth-sec to RT- continue sips of water.   # Anxiety/depression: On anxiolytic as per PCP [on quitapine; not on abilify].will not refill xanax/defer to PCP-   Stable.  # [PET scan- 2024] liver compatible with cirrhosis Slightly intermittent elevated LFts-?  Fatty liver.  Hepatitis panel normal; SEP 2024- [place] [[facility]]- NO mass lesion- Stable.  # Card" Hx of A.fib [Dr.Fath]- Stable 4 cm ascending thoracic aortic aneurysm. Recommend annual imaging followup by CTA or MRA.-defer to cardiology.  On lasix 40 mg/day- see below;   Stable.  # Electrolyte- mild hypokalemia/ hypomagnesia [sec to Lasix]- continue/recommend compliance Kdur once a day.refilled. -stable.  # IV access: port explanted- [sep 2024]- PIV  # DISPOSITION: # follow up in 6 months- MD; labs- Cbc/cmp/ldh;Mag Dr.B     Orders Placed This Encounter  Procedures   CBC with Differential (Cancer Center Only)    Standing Status:   Future    Expected Date:   09/12/2024    Expiration Date:   03/13/2025   CMP (Cancer Center only)    Standing Status:   Future    Expected Date:   09/12/2024    Expiration Date:   03/13/2025   Lactate dehydrogenase    Standing Status:   Future    Expected Date:   09/12/2024    Expiration Date:   03/13/2025   Magnesium    Standing Status:   Future    Expected Date:   09/12/2024    Expiration Date:   03/13/2025   All questions were  answered. The patient knows to call the clinic with any problems, questions or concerns.      Earna Coder, MD 03/13/2024 2:22 PM

## 2024-03-13 NOTE — Progress Notes (Signed)
 C/o rash right leg, using hydrocortisone cream, no symptoms, noticed last night.

## 2024-04-08 ENCOUNTER — Ambulatory Visit
Admission: EM | Admit: 2024-04-08 | Discharge: 2024-04-08 | Disposition: A | Discharge status: Home / Self Care | Attending: Emergency Medicine | Admitting: Emergency Medicine

## 2024-04-08 DIAGNOSIS — K5900 Constipation, unspecified: Secondary | ICD-10-CM

## 2024-04-08 MED ORDER — LACTULOSE 10 GM/15ML PO SOLN
10.0000 g | Freq: Every day | ORAL | 0 refills | Status: DC | PRN
Start: 2024-04-08 — End: 2024-09-15

## 2024-04-08 NOTE — ED Triage Notes (Signed)
 Patient to Urgent Care with complaints of constipation and abdominal pain/ headache.  Symptoms x4-5 days. Concerned about a possible bowel blockage.   Has been using a laxative/ ibuprofen . Reports she had a large bowel movement this morning.

## 2024-04-08 NOTE — ED Provider Notes (Signed)
 Julie Jennings    CSN: [id number] Arrival date & time: 04/08/24  1524      History   Chief Complaint Chief Complaint  Patient presents with   Abdominal Pain    HPI Julie Jennings is a 73 y.o. female.   [facility] for evaluation of generalized abdominal pain, bloating and constipation present for 4 to 5 days.  Has been taking MiraLAX daily, drinking lemon water which has caused small bowel movements, took 3 Ex-Lax last night and had a large bowel movement today.  Has been able to tolerate small amounts of food but at times has irritated the stomach.  Has experienced accompanying headache intermittently.  Has had constipation in the past.  Past Medical History:  Diagnosis Date   A-fib [facility])    Only once   Allergic rhinitis    Anxiety    Arthritis    oesteoarthritis   BP (high blood pressure) 04/16/2014   Cancer of upper lobe of left lung (HCC)    Chronic kidney disease    history nephrolithiasis   CVA (cerebral vascular accident) (HCC)    Depression    Dyspnea    Dysrhythmia    PSVT   Edema    Endometriosis    Hepatic steatosis    Herpes zoster    History of kidney stones    History of PSVT (paroxysmal supraventricular tachycardia)    Irritable bowel syndrome    Lung cancer (HCC) left   Lymphoma (HCC)    "stomach"   Nephrolithiasis    Osteoarthritis    Osteopenia of multiple sites    Palpitations    Paroxysmal atrial fibrillation (HCC)    Post-menopausal bleeding    Stroke [facility])     Patient Active Problem List   Diagnosis Date Noted   Paroxysmal atrial fibrillation (HCC) 09/26/2022   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   Proximal humerus fracture 01/04/2022   SOB (shortness of breath) 05/19/2020   Osteopenia of multiple sites 09/09/2018   Post-menopausal bleeding 07/31/2018   History of screening mammography 09/28/2016   Allergic rhinitis 09/13/2015   Cerebral infarction (HCC) 09/13/2015   Clinical depression  09/13/2015   Endometriosis 09/13/2015   Fatty infiltration of liver 09/13/2015   Adaptive colitis 09/13/2015   Menopause 09/13/2015   Calculus of kidney 09/13/2015   Arthritis, degenerative 09/13/2015   Awareness of heartbeats 09/13/2015   Paroxysmal supraventricular tachycardia (HCC) 09/13/2015   Abnormal LFTs 04/30/2014   Accumulation of fluid in tissues 04/30/2014   Edema 04/30/2014   Anxiety 04/16/2014   Personal history of other diseases of the circulatory system 04/16/2014   BP (high blood pressure) 04/16/2014   HLD (hyperlipidemia) 04/16/2014   Lymphoma (HCC) 04/16/2014   Cancer of upper lobe of left lung (HCC) 06/28/2013    Past Surgical History:  Procedure Laterality Date   AUGMENTATION MAMMAPLASTY Bilateral    CHOLECYSTECTOMY     COLONOSCOPY     COLONOSCOPY WITH PROPOFOL  N/A 04/14/2018   Procedure: COLONOSCOPY WITH PROPOFOL ;  Surgeon: Cassie Click, MD;  Location: [facility] ENDOSCOPY;  Service: Endoscopy;  Laterality: N/A;   COLONOSCOPY WITH PROPOFOL  N/A 01/03/2024   Procedure: COLONOSCOPY WITH PROPOFOL ;  Surgeon: Shane Darling, MD;  Location: [facility] ENDOSCOPY;  Service: Endoscopy;  Laterality: N/A;   DIAGNOSTIC LAPAROSCOPY     DILATION AND CURETTAGE OF UTERUS     ESOPHAGOGASTRODUODENOSCOPY (EGD) WITH PROPOFOL  N/A 01/03/2024   Procedure: ESOPHAGOGASTRODUODENOSCOPY (EGD) WITH PROPOFOL ;  Surgeon: Shane Darling, MD;  Location: [facility] ENDOSCOPY;  Service: Endoscopy;  Laterality: N/A;   FRACTURE SURGERY     HEMORRHOIDECTOMY WITH HEMORRHOID BANDING     HERNIA REPAIR     umbilical hernia   IR IMAGING GUIDED PORT INSERTION  05/04/2022   IR REMOVAL TUN ACCESS W/ PORT W/O FL MOD SED  09/04/2023   LUNG REMOVAL, PARTIAL Left    REVERSE SHOULDER ARTHROPLASTY Left 01/04/2022   Procedure: Left reverse shoulder arthroplasty, biceps tenodesis;  Surgeon: Lorri Rota, MD;  Location: [facility] ORS;  Service: Orthopedics;  Laterality: Left;    OB History     Gravida  0   Para   0   Term  0   Preterm  0   AB  0   Living  0      SAB  0   IAB  0   Ectopic  0   Multiple  0   Live Births  0            Home Medications    Prior to Admission medications   Medication Sig Start Date End Date Taking? Authorizing Provider  lactulose (CHRONULAC) 10 GM/15ML solution Take 15 mLs (10 g total) by mouth daily as needed for mild constipation. 04/08/24  Yes Indalecio Malmstrom R, NP  ALPRAZolam  (XANAX ) 0.5 MG tablet Take 1 tablet (0.5 mg total) by mouth 3 (three) times daily. 11/22/22   Brahmanday, Govinda R, MD  ARIPiprazole (ABILIFY) 2 MG tablet Take 2 mg by mouth daily. 11/23/22   [provider]  aspirin  81 MG chewable tablet Chew by mouth.    [provider]  buPROPion (WELLBUTRIN XL) 150 MG 24 hr tablet Take by mouth. 09/18/23 09/17/24  [provider]  clotrimazole -betamethasone  (LOTRISONE ) cream Apply 1 Application topically daily. Patient not taking: Reported on 03/13/2024 10/01/23   Jinnie Mountain T, DPM  furosemide (LASIX) 40 MG tablet Take 40 mg by mouth daily. 09/30/14 09/25/23  [provider]  losartan  (COZAAR ) 100 MG tablet Take 100 mg by mouth daily. 06/24/20   [provider]  meloxicam  (MOBIC ) 15 MG tablet Take by mouth. 08/05/23   [provider]  nebivolol  (BYSTOLIC ) 10 MG tablet Take 10 mg by mouth daily. 09/30/14   [provider]  pantoprazole  (PROTONIX ) 40 MG tablet Take 40 mg by mouth 2 (two) times daily. 04/22/17 09/25/23  [provider]  Polyethyl Glycol-Propyl Glycol (SYSTANE) 0.4-0.3 % SOLN Place 1 drop into both eyes daily as needed (Dry eye).    [provider]  potassium chloride  SA (KLOR-CON  M) 20 MEQ tablet One pill a day. 03/13/24   Brahmanday, Govinda R, MD  QUEtiapine  (SEROQUEL ) 25 MG tablet Take 1 tablet (25 mg total) by mouth at bedtime. 07/26/22 09/09/23  Hisada, Reina, MD  tiotropium (SPIRIVA ) 18 MCG inhalation capsule Place 18 mcg into inhaler and inhale  daily. Patient not taking: Reported on 03/13/2024 09/30/14   [provider]  triamcinolone  (NASACORT ) 55 MCG/ACT AERO nasal inhaler Place 2 sprays into the nose daily.    [provider]  venlafaxine  XR (EFFEXOR -XR) 75 MG 24 hr capsule Take 3 capsules (225 mg total) by mouth daily with breakfast. 06/26/22   Borders, Carlene Che, NP    Family History Family History  Problem Relation Age of Onset   Stroke Mother    Diabetes Mother    Colon cancer Father    Prostate cancer Father    Breast cancer Neg Hx     Social History Social History  Tobacco Use   Smoking status: Never   Smokeless tobacco: Never  Vaping Use   Vaping status: Never Used  Substance Use Topics   Alcohol  use: No   Drug use: No     Allergies   Tylenol  [acetaminophen ], Buspirone, and Codeine   Review of Systems Review of Systems   Physical Exam Triage Vital Signs ED Triage Vitals  Encounter Vitals Group     BP 04/08/24 1543 108/70     Systolic BP Percentile --      Diastolic BP Percentile --      Pulse Rate 04/08/24 1543 98     Resp 04/08/24 1543 18     Temp 04/08/24 1543 97.7 F (36.5 C)     Temp src --      SpO2 04/08/24 1543 95 %     Weight --      Height --      Head Circumference --      Peak Flow --      Pain Score 04/08/24 1536 4     Pain Loc --      Pain Education --      Exclude from Growth Chart --    No data found.  Updated Vital Signs BP 108/70   Pulse 98   Temp 97.7 F (36.5 C)   Resp 18   SpO2 95%   Visual Acuity Right Eye Distance:   Left Eye Distance:   Bilateral Distance:    Right Eye Near:   Left Eye Near:    Bilateral Near:     Physical Exam Constitutional:      Appearance: Normal appearance. She is well-developed.  Eyes:     Extraocular Movements: Extraocular movements intact.  Pulmonary:     Effort: Pulmonary effort is normal.  Abdominal:     General: Bowel sounds are normal. There is distension.     Palpations: Abdomen is soft.      Tenderness: There is no abdominal tenderness.  Neurological:     Mental Status: She is alert and oriented to person, place, and time. Mental status is at baseline.      UC Treatments / Results  Labs (all labs ordered are listed, but only abnormal results are displayed) Labs Reviewed - No data to display  EKG   Radiology No results found.  Procedures Procedures (including critical care time)  Medications Ordered in UC Medications - No data to display  Initial Impression / Assessment and Plan / UC Course  I have reviewed the triage vital signs and the nursing notes.  Pertinent labs & imaging results that were available during my care of the patient were reviewed by me and considered in my medical decision making (see chart for details).  Constipation  Vital stable, patient in no signs of distress nontoxic-appearing, some distention noted on exam but abdomen is soft, without pain and bowel sounds are normal, discussed findings with patient, low suspicion for obstruction at this time, prescribed lactulose as patient still feels even with over-the-counter medicine she is not fully defecated, discussed administration and advised to increase fluid intake during treatment with food as tolerated, given precautions for persisting symptoms to follow-up with her primary doctor however for worsening symptoms she is to go to the nearest emergency department, verbalized understanding Final Clinical Impressions(s) / UC Diagnoses   Final diagnoses:  Constipation, unspecified constipation type     Discharge Instructions      Your evaluated for constipation  On exam the stomach is  soft and rounded I do not believe that you have a blockage at this time  Take lactulose every day for up to 3 days to help you have a bowel movement, this medicine causes diarrhea and therefore ensure that you are in your restroom after taking use  Ensure that you are drinking lots of fluids such as water,  Gatorade or similar products to maintain your hydration until you are able to eat as normal  For gas you may take over-the-counter medicine such as Tums, Pepto-Bismol, Gas-X etc.  At any point if your abdominal pain worsens in severity please go to the nearest emergency department for evaluation  If your symptoms continue to persist please follow-up with your primary doctor   ED Prescriptions     Medication Sig Dispense Auth. Provider   lactulose (CHRONULAC) 10 GM/15ML solution Take 15 mLs (10 g total) by mouth daily as needed for mild constipation. 236 mL Brok Stocking, Maybelle Spatz, NP      PDMP not reviewed this encounter.   Reena Canning, NP 04/08/24 1616

## 2024-04-08 NOTE — Discharge Instructions (Signed)
 Your evaluated for constipation  On exam the stomach is soft and rounded I do not believe that you have a blockage at this time  Take lactulose every day for up to 3 days to help you have a bowel movement, this medicine causes diarrhea and therefore ensure that you are in your restroom after taking use  Ensure that you are drinking lots of fluids such as water, Gatorade or similar products to maintain your hydration until you are able to eat as normal  For gas you may take over-the-counter medicine such as Tums, Pepto-Bismol, Gas-X etc.  At any point if your abdominal pain worsens in severity please go to the nearest emergency department for evaluation  If your symptoms continue to persist please follow-up with your primary doctor

## 2024-04-09 ENCOUNTER — Telehealth: Payer: Self-pay | Admitting: *Deleted

## 2024-04-09 ENCOUNTER — Telehealth: Payer: Self-pay | Admitting: Family Medicine

## 2024-04-09 NOTE — Telephone Encounter (Signed)
 Returned patient's call to advise that she cannot repeat a dose of lactulose  if she is already taken 1 dose.  If she is having worsening of symptoms and feel as if she is unable to have bowel movements after she is trialed MiraLAX and lactulose  this could be a symptom of a bowel blockage and I would recommend going to the emergency department for further workup and evaluation. The pain is not severe in her abdomen and she just has a sensation that she has to have another bowel movement she can retake the lactulose  tomorrow and today she can take a over-the-counter stool softener see if this will help facilitate bowel movements.  If she is feeling any worse she would need to go to the emergency department as we have exhausted all sorts of treatment here at urgent care.

## 2024-04-14 ENCOUNTER — Other Ambulatory Visit: Payer: Self-pay | Admitting: Family Medicine

## 2024-04-14 DIAGNOSIS — F411 Generalized anxiety disorder: Secondary | ICD-10-CM

## 2024-04-14 DIAGNOSIS — R440 Auditory hallucinations: Secondary | ICD-10-CM

## 2024-04-22 ENCOUNTER — Ambulatory Visit
Admission: RE | Admit: 2024-04-22 | Discharge: 2024-04-22 | Disposition: A | Discharge status: Home / Self Care | Source: Ambulatory Visit | Attending: Family Medicine | Admitting: Family Medicine

## 2024-04-22 DIAGNOSIS — R440 Auditory hallucinations: Secondary | ICD-10-CM | POA: Insufficient documentation

## 2024-04-22 DIAGNOSIS — F411 Generalized anxiety disorder: Secondary | ICD-10-CM | POA: Insufficient documentation

## 2024-04-29 ENCOUNTER — Other Ambulatory Visit: Payer: Self-pay | Admitting: Internal Medicine

## 2024-04-29 DIAGNOSIS — Z1231 Encounter for screening mammogram for malignant neoplasm of breast: Secondary | ICD-10-CM

## 2024-09-09 ENCOUNTER — Other Ambulatory Visit: Payer: Self-pay | Admitting: Family Medicine

## 2024-09-09 ENCOUNTER — Ambulatory Visit
Admission: RE | Admit: 2024-09-09 | Discharge: 2024-09-09 | Disposition: A | Discharge status: Home / Self Care | Source: Ambulatory Visit | Attending: Family Medicine | Admitting: Family Medicine

## 2024-09-09 DIAGNOSIS — R600 Localized edema: Secondary | ICD-10-CM | POA: Diagnosis present

## 2024-09-09 DIAGNOSIS — M79604 Pain in right leg: Secondary | ICD-10-CM

## 2024-09-15 ENCOUNTER — Encounter: Payer: Self-pay | Admitting: Internal Medicine

## 2024-09-15 ENCOUNTER — Telehealth: Payer: Self-pay | Admitting: *Deleted

## 2024-09-15 ENCOUNTER — Inpatient Hospital Stay: Attending: Internal Medicine

## 2024-09-15 ENCOUNTER — Inpatient Hospital Stay: Admitting: Internal Medicine

## 2024-09-15 VITALS — BP 124/90 | HR 84 | Temp 99.4°F | Resp 18 | Ht 67.0 in | Wt 172.2 lb

## 2024-09-15 DIAGNOSIS — Z8042 Family history of malignant neoplasm of prostate: Secondary | ICD-10-CM | POA: Insufficient documentation

## 2024-09-15 DIAGNOSIS — C8331 Diffuse large B-cell lymphoma, lymph nodes of head, face, and neck: Secondary | ICD-10-CM | POA: Diagnosis present

## 2024-09-15 DIAGNOSIS — Z79899 Other long term (current) drug therapy: Secondary | ICD-10-CM | POA: Diagnosis not present

## 2024-09-15 DIAGNOSIS — Z85118 Personal history of other malignant neoplasm of bronchus and lung: Secondary | ICD-10-CM | POA: Insufficient documentation

## 2024-09-15 DIAGNOSIS — Z923 Personal history of irradiation: Secondary | ICD-10-CM | POA: Insufficient documentation

## 2024-09-15 DIAGNOSIS — F32A Depression, unspecified: Secondary | ICD-10-CM | POA: Insufficient documentation

## 2024-09-15 DIAGNOSIS — E876 Hypokalemia: Secondary | ICD-10-CM | POA: Diagnosis not present

## 2024-09-15 DIAGNOSIS — K760 Fatty (change of) liver, not elsewhere classified: Secondary | ICD-10-CM | POA: Diagnosis not present

## 2024-09-15 DIAGNOSIS — F419 Anxiety disorder, unspecified: Secondary | ICD-10-CM | POA: Insufficient documentation

## 2024-09-15 DIAGNOSIS — N939 Abnormal uterine and vaginal bleeding, unspecified: Secondary | ICD-10-CM | POA: Insufficient documentation

## 2024-09-15 DIAGNOSIS — I4891 Unspecified atrial fibrillation: Secondary | ICD-10-CM | POA: Diagnosis not present

## 2024-09-15 DIAGNOSIS — Z800 Family history of malignant neoplasm of digestive organs: Secondary | ICD-10-CM | POA: Insufficient documentation

## 2024-09-15 LAB — CBC WITH DIFFERENTIAL (CANCER CENTER ONLY)
Abs Immature Granulocytes: 0.02 K/uL (ref 0.00–0.07)
Basophils Absolute: 0 K/uL (ref 0.0–0.1)
Basophils Relative: 1 %
Eosinophils Absolute: 0.2 K/uL (ref 0.0–0.5)
Eosinophils Relative: 4 %
HCT: 37.7 % (ref 36.0–46.0)
Hemoglobin: 12.5 g/dL (ref 12.0–15.0)
Immature Granulocytes: 0 %
Lymphocytes Relative: 16 %
Lymphs Abs: 0.8 K/uL (ref 0.7–4.0)
MCH: 31.4 pg (ref 26.0–34.0)
MCHC: 33.2 g/dL (ref 30.0–36.0)
MCV: 94.7 fL (ref 80.0–100.0)
Monocytes Absolute: 0.4 K/uL (ref 0.1–1.0)
Monocytes Relative: 9 %
Neutro Abs: 3.5 K/uL (ref 1.7–7.7)
Neutrophils Relative %: 70 %
Platelet Count: 181 K/uL (ref 150–400)
RBC: 3.98 MIL/uL (ref 3.87–5.11)
RDW: 13.5 % (ref 11.5–15.5)
WBC Count: 5 K/uL (ref 4.0–10.5)
nRBC: 0 % (ref 0.0–0.2)

## 2024-09-15 LAB — CMP (CANCER CENTER ONLY)
ALT: 36 U/L (ref 0–44)
AST: 46 U/L — ABNORMAL HIGH (ref 15–41)
Albumin: 3.9 g/dL (ref 3.5–5.0)
Alkaline Phosphatase: 148 U/L — ABNORMAL HIGH (ref 38–126)
Anion gap: 9 (ref 5–15)
BUN: 19 mg/dL (ref 8–23)
CO2: 28 mmol/L (ref 22–32)
Calcium: 9.2 mg/dL (ref 8.9–10.3)
Chloride: 98 mmol/L (ref 98–111)
Creatinine: 0.92 mg/dL (ref 0.44–1.00)
GFR, Estimated: >60 mL/min (ref >60)
Glucose, Bld: 142 mg/dL — ABNORMAL HIGH (ref 70–99)
Potassium: 4.3 mmol/L (ref 3.5–5.1)
Sodium: 135 mmol/L (ref 135–145)
Total Bilirubin: 0.8 mg/dL (ref 0.0–1.2)
Total Protein: 7.1 g/dL (ref 6.5–8.1)

## 2024-09-15 LAB — MAGNESIUM: Magnesium: 2.3 mg/dL (ref 1.7–2.4)

## 2024-09-15 LAB — LACTATE DEHYDROGENASE: LDH: 167 U/L (ref 98–192)

## 2024-09-15 NOTE — Progress Notes (Signed)
 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 Biopsy is suggestive of fatty liver changes  # carcinoma of lung status post left upper lobe resection in July of 2014; Adenocarcinoma T1N0 M0 tumor EGFR positive  SURGICAL PATHOLOGY  CASE: [id number]  PATIENT: Julie Jennings  Surgical Pathology Report      Specimen Submitted:  A. Lymph node, left neck   Clinical History: New left cervical lymphadenopathy.  History of  lymphoma and lung cancer.  New lymphadenopathy    MAY 17th, 2023- [Dr.juengle]; A. LYMPH NODE, LEFT NECK; ULTRASOUND-GUIDED BIOPSY:  - FINDINGS COMPATIBLE WITH LARGE B-CELL LYMPHOMA.   Comment:  Core biopsy sections display lymphoid tissue with a somewhat effaced  immunoarchitecture.  Portions of nodal tissue are comprised of sheets of  small lymphocytes, while other areas display dense background fibrosis,  and aggregates of large abnormal lymphocytes with irregular nuclear  contours, open chromatin, visible nucleoli, and retraction artifact.   Immunohistochemical studies demonstrate diffuse positivity for CD20  within regions comprised of larger lymphocytes, compatible with B cells.  CD3 highlights a background of small T cells.  CK AE1/AE3 is negative  for metastatic carcinoma.  PAX5 displays dim, patchy marking of larger  abnormal B cells.  In addition, these B cells appear to display dim  expression of CD10, with  positivity for Bcl-2, BCL6, and Mum-1.  B cells  are negative for CD5.  CD30 displays increased marking.  C-Myc is  positive, staining greater than 40% of larger cells.  CD45 (LCA) is  diffusely positive, without aberrant loss of expression.   Concurrent flow cytometric studies demonstrate a CD10 positive  monoclonal B-cell population in a background of many polytypic B cells,  representing 3% of total viable lymphoid cells and 8% of B cells.  For  further details, see scanned report in [facility].   The patient's history is of diffuse large B-cell lymphoma, as well as  invasive adenocarcinoma of the lung are noted.  Biopsy sections  demonstrate an abnormal proliferation of large B cells, compatible with  involvement by a diffuse large B-cell lymphoma. Further  subclassification is difficult, secondary to limited tissue, however,  presence of C10 marking would suggest a germinal center immunophenotype.  In addition, there does appear to be expression of both Bcl-2 and c-myc,  which may suggest a more aggressive process. There is no evidence of  metastatic adenocarcinoma. FISH testing for prognostically significant  abnormalities of BCL2, BCL6, and MYC will be attempted, and reported as  an addendum.   IMPRESSION: 1. Enlarged hypermetabolic lymph nodes in the bilateral left greater than right neck and asymmetric right palatine tonsil hypermetabolism with associated soft tissue fullness on the CT images, all new since 2014 PET-CT, most compatible with recurrent lymphoma. Deauville category 5.  # MAY 12th, 2023- DLBCL- STAGE II [NO Bone marrow]; GCB; double expresser-mcy; Bcl-2; QNS-FISH.   # MAY 30th, 2023- RCEOP q 3 W x3-RT [finished radiation-sep 7th, 2023]   Cancer of upper lobe of  left lung (HCC)  Diffuse large B-cell lymphoma of lymph nodes of neck (HCC)  04/25/2022 Initial Diagnosis   Diffuse large B-cell lymphoma of lymph nodes of neck (HCC)   04/26/2022 Cancer Staging   Staging  form: Hodgkin and Non-Hodgkin Lymphoma, AJCC 8th Edition - Clinical: Stage II - Signed by Rennie Cindy SAUNDERS, MD on 04/26/2022 Histopathologic type: Malignant lymphoma, large B-cell, diffuse, NOS   05/08/2022 - 06/22/2022 Chemotherapy   [name]      INTERVAL HISTORY: Patient is alone.   She is ambulating independently.  Julie Jennings 73 y.o.  female pleasant patient with extreme anxiety; and diffuse large B cell lymphoma [prior history of 2005 DLCBL] - status post R-CEOP cycle #3; also followed by radiation finished SEP 2023 is here for a follow up.   Patient complains of allergies/ dry cough. Has not been using spiriva .   Patient noted to have vaginal bleeding.   Chronic mild tremor.  Chronic dry mouth.   No weight loss. Patient denies any worsening enlargement of her lymph nodes.    Review of Systems  Constitutional:  Negative for chills, diaphoresis, fever, malaise/fatigue and weight loss.  HENT:  Negative for nosebleeds and sore throat.   Eyes:  Negative for double vision.  Respiratory:  Negative for cough, hemoptysis, sputum production, shortness of breath and wheezing.   Cardiovascular:  Negative for chest pain, palpitations, orthopnea and leg swelling.  Gastrointestinal:  Negative for abdominal pain, blood in stool, constipation, diarrhea, heartburn, melena, nausea and vomiting.  Musculoskeletal:  Positive for joint pain. Negative for back pain.  Skin: Negative.  Negative for itching and rash.  Neurological:  Negative for dizziness, tingling, focal weakness, weakness and headaches.  Endo/Heme/Allergies:  Does not bruise/bleed easily.  Psychiatric/Behavioral:  Negative for depression. The patient is nervous/anxious. The patient does not have insomnia.      PAST MEDICAL HISTORY :  Past Medical History:  Diagnosis Date   A-fib [facility])    Only once   Allergic rhinitis    Anxiety    Arthritis     oesteoarthritis   BP (high blood pressure) 04/16/2014   Cancer of upper lobe of left lung (HCC)    Chronic kidney disease    history nephrolithiasis   CVA (cerebral vascular accident) (HCC)    Depression    Dyspnea    Dysrhythmia    PSVT   Edema    Endometriosis    Hepatic steatosis    Herpes zoster    History of kidney stones    History of PSVT (paroxysmal supraventricular tachycardia)    Irritable bowel syndrome    Lung cancer (HCC) left   Lymphoma (HCC)    stomach   Nephrolithiasis    Osteoarthritis    Osteopenia of multiple sites    Palpitations    Paroxysmal atrial fibrillation (HCC)    Post-menopausal bleeding    Stroke (HCC)     PAST SURGICAL HISTORY :   Past Surgical History:  Procedure Laterality Date   AUGMENTATION MAMMAPLASTY Bilateral    CHOLECYSTECTOMY     COLONOSCOPY     COLONOSCOPY WITH PROPOFOL  N/A 04/14/2018   Procedure: COLONOSCOPY WITH PROPOFOL ;  Surgeon: Viktoria Lamar DASEN, MD;  Location: [facility] ENDOSCOPY;  Service: Endoscopy;  Laterality: N/A;   COLONOSCOPY WITH PROPOFOL  N/A 01/03/2024   Procedure: COLONOSCOPY WITH PROPOFOL ;  Surgeon: Maryruth Ole DASEN, MD;  Location: [facility] ENDOSCOPY;  Service: Endoscopy;  Laterality: N/A;  DIAGNOSTIC LAPAROSCOPY     DILATION AND CURETTAGE OF UTERUS     ESOPHAGOGASTRODUODENOSCOPY (EGD) WITH PROPOFOL  N/A 01/03/2024   Procedure: ESOPHAGOGASTRODUODENOSCOPY (EGD) WITH PROPOFOL ;  Surgeon: Maryruth Ole DASEN, MD;  Location: [facility] ENDOSCOPY;  Service: Endoscopy;  Laterality: N/A;   FRACTURE SURGERY     HEMORRHOIDECTOMY WITH HEMORRHOID BANDING     HERNIA REPAIR     umbilical hernia   IR IMAGING GUIDED PORT INSERTION  05/04/2022   IR REMOVAL TUN ACCESS W/ PORT W/O FL MOD SED  09/04/2023   LUNG REMOVAL, PARTIAL Left    REVERSE SHOULDER ARTHROPLASTY Left 01/04/2022   Procedure: Left reverse shoulder arthroplasty, biceps tenodesis;  Surgeon: Tobie Priest, MD;  Location: [facility] ORS;  Service: Orthopedics;  Laterality: Left;     FAMILY HISTORY :   Family History  Problem Relation Age of Onset   Stroke Mother    Diabetes Mother    Colon cancer Father    Prostate cancer Father    Breast cancer Neg Hx     SOCIAL HISTORY:   Social History   Tobacco Use   Smoking status: Never   Smokeless tobacco: Never  Vaping Use   Vaping status: Never Used  Substance Use Topics   Alcohol  use: No   Drug use: No    ALLERGIES:  is allergic to tylenol  [acetaminophen ], buspirone, and codeine.  MEDICATIONS:  Current Outpatient Medications  Medication Sig Dispense Refill   aspirin  81 MG chewable tablet Chew by mouth.     buPROPion (WELLBUTRIN XL) 150 MG 24 hr tablet Take by mouth.     furosemide (LASIX) 40 MG tablet Take 40 mg by mouth daily.     losartan  (COZAAR ) 100 MG tablet Take 100 mg by mouth daily.     meloxicam  (MOBIC ) 15 MG tablet Take by mouth.     nebivolol  (BYSTOLIC ) 10 MG tablet Take 10 mg by mouth daily.     pantoprazole  (PROTONIX ) 40 MG tablet Take 40 mg by mouth 2 (two) times daily.     Polyethyl Glycol-Propyl Glycol (SYSTANE) 0.4-0.3 % SOLN Place 1 drop into both eyes daily as needed (Dry eye).     potassium chloride  SA (KLOR-CON  M) 20 MEQ tablet One pill a day. 30 tablet 3   triamcinolone  (NASACORT ) 55 MCG/ACT AERO nasal inhaler Place 2 sprays into the nose daily.     venlafaxine  XR (EFFEXOR -XR) 75 MG 24 hr capsule Take 3 capsules (225 mg total) by mouth daily with breakfast. 90 capsule 1   No current facility-administered medications for this visit.   Facility-Administered Medications Ordered in Other Visits  Medication Dose Route Frequency Provider Last Rate Last Admin   sodium chloride  flush (NS) 0.9 % injection 10 mL  10 mL Intravenous PRN Augustus Zurawski R, MD   10 mL at 01/22/23 1452    PHYSICAL EXAMINATION: ECOG PERFORMANCE STATUS: 0 - Asymptomatic  BP (!) 124/90 (BP Location: Left Arm, Patient Position: Sitting, Cuff Size: Normal)   Pulse 84   Temp 99.4 F (37.4 C) (Tympanic)    Resp 18   Ht 5' 7 (1.702 m)   Wt 172 lb 3.2 oz (78.1 kg)   SpO2 98%   BMI 26.97 kg/m   Filed Weights   09/15/24 1018  Weight: 172 lb 3.2 oz (78.1 kg)   No Lymphadenopathy.   Physical Exam HENT:     Head: Normocephalic and atraumatic.     Mouth/Throat:     Pharynx: No oropharyngeal exudate.  Eyes:  Pupils: Pupils are equal, round, and reactive to light.  Cardiovascular:     Rate and Rhythm: Normal rate and regular rhythm.  Pulmonary:     Effort: Pulmonary effort is normal. No respiratory distress.     Breath sounds: Normal breath sounds. No wheezing.  Abdominal:     General: Bowel sounds are normal. There is no distension.     Palpations: Abdomen is soft. There is no mass.     Tenderness: There is no abdominal tenderness. There is no guarding or rebound.  Musculoskeletal:        General: No tenderness. Normal range of motion.     Cervical back: Normal range of motion and neck supple.  Skin:    General: Skin is warm.  Neurological:     Mental Status: She is alert and oriented to person, place, and time.  Psychiatric:        Mood and Affect: Affect normal.      LABORATORY DATA:  I have reviewed the data as listed    Component Value Date/Time   NA 135 09/15/2024 1022   NA 139 03/14/2015 0856   K 4.3 09/15/2024 1022   K 4.1 03/14/2015 0856   CL 98 09/15/2024 1022   CL 103 03/14/2015 0856   CO2 28 09/15/2024 1022   CO2 28 03/14/2015 0856   GLUCOSE 142 (H) 09/15/2024 1022   GLUCOSE 111 (H) 03/14/2015 0856   BUN 19 09/15/2024 1022   BUN 19 03/14/2015 0856   CREATININE 0.92 09/15/2024 1022   CREATININE 0.56 03/14/2015 0856   CALCIUM  9.2 09/15/2024 1022   CALCIUM  9.3 03/14/2015 0856   PROT 7.1 09/15/2024 1022   PROT 7.7 03/14/2015 0856   ALBUMIN 3.9 09/15/2024 1022   ALBUMIN 4.3 03/14/2015 0856   AST 46 (H) 09/15/2024 1022   ALT 36 09/15/2024 1022   ALT 87 (H) 03/14/2015 0856   ALKPHOS 148 (H) 09/15/2024 1022   ALKPHOS 164 (H) 03/14/2015 0856   BILITOT  0.8 09/15/2024 1022   GFRNONAA >60 09/15/2024 1022   GFRNONAA >60 03/14/2015 0856   GFRAA >60 07/07/2020 1041   GFRAA >60 03/14/2015 0856    No results found for: SPEP, UPEP  Lab Results  Component Value Date   WBC 5.0 09/15/2024   NEUTROABS 3.5 09/15/2024   HGB 12.5 09/15/2024   HCT 37.7 09/15/2024   MCV 94.7 09/15/2024   PLT 181 09/15/2024      Chemistry      Component Value Date/Time   NA 135 09/15/2024 1022   NA 139 03/14/2015 0856   K 4.3 09/15/2024 1022   K 4.1 03/14/2015 0856   CL 98 09/15/2024 1022   CL 103 03/14/2015 0856   CO2 28 09/15/2024 1022   CO2 28 03/14/2015 0856   BUN 19 09/15/2024 1022   BUN 19 03/14/2015 0856   CREATININE 0.92 09/15/2024 1022   CREATININE 0.56 03/14/2015 0856      Component Value Date/Time   CALCIUM  9.2 09/15/2024 1022   CALCIUM  9.3 03/14/2015 0856   ALKPHOS 148 (H) 09/15/2024 1022   ALKPHOS 164 (H) 03/14/2015 0856   AST 46 (H) 09/15/2024 1022   ALT 36 09/15/2024 1022   ALT 87 (H) 03/14/2015 0856   BILITOT 0.8 09/15/2024 1022       RADIOGRAPHIC STUDIES: I have personally reviewed the radiological images as listed and agreed with the findings in the report. No results found.   ASSESSMENT & PLAN:  Diffuse large B-cell lymphoma of  lymph nodes of neck (HCC) #Diffuse B-cell lymphoma-bilateral neck left more than right PET scan.  Clinically stage II. Currently s/p R-CEOP chemotherapy every 3 weeks x 3 cycles-followed by involved field radiation.  Patient currently status post radiation-finished radiation on sep 7th, 2024- NOV 2023- incidentally- PET scan: Interval decrease and bilateral and symmetric increased uptake within the palatine tonsils; New mild increased radiotracer uptake within the pharyngeal tonsils is identified, left greater than right. S/p evaluation with Dr.Jeungle-negative for any recurrent malignancy.   # continue surveillance without imaging at this time- Stable.   # Vaginal bleeding- [Dr.Schermhorn]-  awaiting further evaluation.   # Anxiety/depression: On anxiolytic as per PCP [on quitapine; not on abilify].will not refill xanax /defer to PCP-   Stable.  # [PET scan- 2024] liver compatible with cirrhosis Slightly intermittent elevated LFts-?  Fatty liver.  Hepatitis panel normal; SEP 2024- US  [[facility]]- NO mass lesion- Stable.  # Card Hx of A.fib [Dr.Fath]- Stable 4 cm ascending thoracic aortic aneurysm. Recommend annual imaging followup by CTA or MRA.-defer to cardiology.  On lasix 40 mg/day- see below;   Stable.  # Electrolyte- mild hypokalemia/ hypomagnesia [sec to Lasix]- continue/recommend compliance Kdur once a day.refilled. -stable.  # IV access: port explanted- [sep 2024]- PIV  # DISPOSITION: # follow up in 6 months- MD; labs- Cbc/cmp/ldh;Mag Dr.B      Orders Placed This Encounter  Procedures   CBC with Differential (Cancer Center Only)    Standing Status:   Future    Expected Date:   03/16/2025    Expiration Date:   06/14/2025   CMP (Cancer Center only)    Standing Status:   Future    Expected Date:   03/16/2025    Expiration Date:   06/14/2025   Lactate dehydrogenase    Standing Status:   Future    Expected Date:   03/16/2025    Expiration Date:   06/14/2025   Magnesium     Standing Status:   Future    Expected Date:   03/16/2025    Expiration Date:   06/14/2025   All questions were answered. The patient knows to call the clinic with any problems, questions or concerns.      Cindy JONELLE Joe, MD 09/15/2024 11:27 AM

## 2024-09-15 NOTE — Progress Notes (Signed)
 Pt would like to know if she can have a rx for spiriva ?  Pt states she is having hot spells. Has seen pcp and sch'd to see gyn [facility] for vaginal bleeding.

## 2024-09-15 NOTE — Assessment & Plan Note (Signed)
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  Clinically stage II. Currently s/p R-CEOP chemotherapy every 3 weeks x 3 cycles-followed by involved field radiation.  Patient currently status post radiation-finished radiation on sep 7th, 2024- NOV 2023- incidentally- PET scan: Interval decrease and bilateral and symmetric increased uptake within the palatine tonsils; New mild increased radiotracer uptake within the pharyngeal tonsils is identified, left greater than right. S/p evaluation with Dr.Jeungle-negative for any recurrent malignancy.   # continue surveillance without imaging at this time- Stable.   # Vaginal bleeding- [Dr.Schermhorn]- awaiting further evaluation.   # Anxiety/depression: On anxiolytic as per PCP [on quitapine; not on abilify].will not refill xanax /defer to PCP-   Stable.  # [PET scan- 2024] liver compatible with cirrhosis Slightly intermittent elevated LFts-?  Fatty liver.  Hepatitis panel normal; SEP 2024- US  [[facility]]- NO mass lesion- Stable.  # Card Hx of A.fib [Dr.Fath]- Stable 4 cm ascending thoracic aortic aneurysm. Recommend annual imaging followup by CTA or MRA.-defer to cardiology.  On lasix 40 mg/day- see below;   Stable.  # Electrolyte- mild hypokalemia/ hypomagnesia [sec to Lasix]- continue/recommend compliance Kdur once a day.refilled. -stable.  # IV access: port explanted- [sep 2024]- PIV  # DISPOSITION: # follow up in 6 months- MD; labs- Cbc/cmp/ldh;Mag Dr.B

## 2024-09-15 NOTE — Telephone Encounter (Signed)
 The patient called and said that they cannot get anybody to see when she is going to come in today. I told her that her labs are at 10:15 and then see Dr. For Monday right after that.  She says she will make it to the appt.

## 2024-10-06 ENCOUNTER — Other Ambulatory Visit: Payer: Self-pay | Admitting: Gastroenterology

## 2024-10-06 DIAGNOSIS — K760 Fatty (change of) liver, not elsewhere classified: Secondary | ICD-10-CM

## 2024-10-09 ENCOUNTER — Encounter: Payer: Self-pay | Admitting: Internal Medicine

## 2024-10-12 ENCOUNTER — Encounter: Payer: Self-pay | Admitting: Internal Medicine

## 2024-10-13 ENCOUNTER — Ambulatory Visit
Admission: RE | Admit: 2024-10-13 | Discharge: 2024-10-13 | Disposition: A | Discharge status: Home / Self Care | Source: Ambulatory Visit | Attending: Gastroenterology | Admitting: Gastroenterology

## 2024-10-13 DIAGNOSIS — K838 Other specified diseases of biliary tract: Secondary | ICD-10-CM | POA: Diagnosis not present

## 2024-10-13 DIAGNOSIS — K7689 Other specified diseases of liver: Secondary | ICD-10-CM | POA: Diagnosis not present

## 2024-10-13 DIAGNOSIS — K760 Fatty (change of) liver, not elsewhere classified: Secondary | ICD-10-CM | POA: Diagnosis not present

## 2024-10-13 DIAGNOSIS — K7460 Unspecified cirrhosis of liver: Secondary | ICD-10-CM | POA: Diagnosis not present

## 2024-10-13 DIAGNOSIS — Z9049 Acquired absence of other specified parts of digestive tract: Secondary | ICD-10-CM | POA: Diagnosis not present

## 2024-10-14 DIAGNOSIS — M7989 Other specified soft tissue disorders: Secondary | ICD-10-CM | POA: Diagnosis not present

## 2024-10-14 DIAGNOSIS — E782 Mixed hyperlipidemia: Secondary | ICD-10-CM | POA: Diagnosis not present

## 2024-10-14 DIAGNOSIS — Z1231 Encounter for screening mammogram for malignant neoplasm of breast: Secondary | ICD-10-CM | POA: Diagnosis not present

## 2024-10-14 DIAGNOSIS — Z79899 Other long term (current) drug therapy: Secondary | ICD-10-CM | POA: Diagnosis not present

## 2024-10-14 DIAGNOSIS — I10 Essential (primary) hypertension: Secondary | ICD-10-CM | POA: Diagnosis not present

## 2024-10-14 DIAGNOSIS — F419 Anxiety disorder, unspecified: Secondary | ICD-10-CM | POA: Diagnosis not present

## 2024-10-19 ENCOUNTER — Encounter: Payer: Self-pay | Admitting: Internal Medicine

## 2024-10-21 ENCOUNTER — Encounter (INDEPENDENT_AMBULATORY_CARE_PROVIDER_SITE_OTHER): Payer: Self-pay | Admitting: Vascular Surgery

## 2024-10-21 ENCOUNTER — Ambulatory Visit (INDEPENDENT_AMBULATORY_CARE_PROVIDER_SITE_OTHER): Admitting: Vascular Surgery

## 2024-10-21 VITALS — BP 121/81 | HR 85 | Resp 16 | Ht 67.0 in | Wt 172.2 lb

## 2024-10-21 DIAGNOSIS — I10 Essential (primary) hypertension: Secondary | ICD-10-CM | POA: Diagnosis not present

## 2024-10-21 DIAGNOSIS — I890 Lymphedema, not elsewhere classified: Secondary | ICD-10-CM | POA: Diagnosis not present

## 2024-10-23 ENCOUNTER — Ambulatory Visit: Admission: EM | Admit: 2024-10-23 | Discharge: 2024-10-23 | Disposition: A | Discharge status: Home / Self Care

## 2024-10-23 DIAGNOSIS — L0231 Cutaneous abscess of buttock: Secondary | ICD-10-CM | POA: Diagnosis not present

## 2024-10-23 MED ORDER — DOXYCYCLINE HYCLATE 100 MG PO CAPS: 100.0000 mg | ORAL_CAPSULE | Freq: Two times a day (BID) | ORAL | 0 refills | Status: DC

## 2024-10-23 MED ORDER — MUPIROCIN 2 % EX OINT: 1.0000 | TOPICAL_OINTMENT | Freq: Two times a day (BID) | CUTANEOUS | 0 refills | Status: DC

## 2024-10-23 NOTE — ED Triage Notes (Addendum)
 Patient to Urgent Care with complaints of a painful blister/ redness to her right buttocks.  Symptoms x5 days.   Using cortisone cream.

## 2024-10-23 NOTE — ED Provider Notes (Signed)
 Julie Jennings    CSN: [id number] Arrival date & time: 10/23/24  1904      History   Chief Complaint Chief Complaint  Patient presents with   Wound Check    HPI Julie Jennings is a 73 y.o. female.  Patient presents with a painful sore on her right buttock x 5 days.  She has been treating this with cortisone cream.  She reports no fever or drainage.  The history is provided by the patient and medical records.    Past Medical History:  Diagnosis Date   A-fib [facility])    Only once   Allergic rhinitis    Anxiety    Arthritis    oesteoarthritis   BP (high blood pressure) 04/16/2014   Cancer of upper lobe of left lung (HCC)    Chronic kidney disease    history nephrolithiasis   CVA (cerebral vascular accident) (HCC)    Depression    Dyspnea    Dysrhythmia    PSVT   Edema    Endometriosis    Hepatic steatosis    Herpes zoster    History of kidney stones    History of PSVT (paroxysmal supraventricular tachycardia)    Irritable bowel syndrome    Lung cancer (HCC) left   Lymphoma (HCC)    stomach   Nephrolithiasis    Osteoarthritis    Osteopenia of multiple sites    Palpitations    Paroxysmal atrial fibrillation (HCC)    Post-menopausal bleeding    Stroke [facility])     Patient Active Problem List   Diagnosis Date Noted   Paroxysmal atrial fibrillation (HCC) 09/26/2022   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   Proximal humerus fracture 01/04/2022   SOB (shortness of breath) 05/19/2020   Osteopenia of multiple sites 09/09/2018   Post-menopausal bleeding 07/31/2018   History of screening mammography 09/28/2016   Allergic rhinitis 09/13/2015   Cerebral infarction (HCC) 09/13/2015   Clinical depression 09/13/2015   Endometriosis 09/13/2015   Fatty infiltration of liver 09/13/2015   Adaptive colitis 09/13/2015   Menopause 09/13/2015   Calculus of kidney 09/13/2015   Arthritis, degenerative 09/13/2015   Awareness of heartbeats  09/13/2015   Paroxysmal supraventricular tachycardia 09/13/2015   Abnormal LFTs 04/30/2014   Accumulation of fluid in tissues 04/30/2014   Edema 04/30/2014   Anxiety 04/16/2014   Personal history of other diseases of the circulatory system 04/16/2014   BP (high blood pressure) 04/16/2014   HLD (hyperlipidemia) 04/16/2014   Lymphoma (HCC) 04/16/2014   Cancer of upper lobe of left lung (HCC) 06/28/2013    Past Surgical History:  Procedure Laterality Date   AUGMENTATION MAMMAPLASTY Bilateral    CHOLECYSTECTOMY     COLONOSCOPY     COLONOSCOPY WITH PROPOFOL  N/A 04/14/2018   Procedure: COLONOSCOPY WITH PROPOFOL ;  Surgeon: Julie Lamar DASEN, MD;  Location: [facility] ENDOSCOPY;  Service: Endoscopy;  Laterality: N/A;   COLONOSCOPY WITH PROPOFOL  N/A 01/03/2024   Procedure: COLONOSCOPY WITH PROPOFOL ;  Surgeon: Julie Ole DASEN, MD;  Location: [facility] ENDOSCOPY;  Service: Endoscopy;  Laterality: N/A;   DIAGNOSTIC LAPAROSCOPY     DILATION AND CURETTAGE OF UTERUS     ESOPHAGOGASTRODUODENOSCOPY (EGD) WITH PROPOFOL  N/A 01/03/2024   Procedure: ESOPHAGOGASTRODUODENOSCOPY (EGD) WITH PROPOFOL ;  Surgeon: Julie Ole DASEN, MD;  Location: [facility] ENDOSCOPY;  Service: Endoscopy;  Laterality: N/A;   FRACTURE SURGERY     HEMORRHOIDECTOMY WITH HEMORRHOID BANDING     HERNIA REPAIR     umbilical  hernia   IR IMAGING GUIDED PORT INSERTION  05/04/2022   IR REMOVAL TUN ACCESS W/ PORT W/O FL MOD SED  09/04/2023   LUNG REMOVAL, PARTIAL Left    REVERSE SHOULDER ARTHROPLASTY Left 01/04/2022   Procedure: Left reverse shoulder arthroplasty, biceps tenodesis;  Surgeon: Julie Priest, MD;  Location: [facility] ORS;  Service: Orthopedics;  Laterality: Left;    OB History     Gravida  0   Para  0   Term  0   Preterm  0   AB  0   Living  0      SAB  0   IAB  0   Ectopic  0   Multiple  0   Live Births  0            Home Medications    Prior to Admission medications   Medication Sig Start Date End Date  Taking? Authorizing Provider  doxycycline (VIBRAMYCIN) 100 MG capsule Take 1 capsule (100 mg total) by mouth 2 (two) times daily for 7 days. 10/23/24 10/30/24 Yes Julie Burnard DEL, NP  gabapentin (NEURONTIN) 300 MG capsule Take 300 mg by mouth at bedtime. 10/14/24 10/14/25 Yes [provider]  mupirocin ointment (BACTROBAN) 2 % Apply 1 Application topically 2 (two) times daily. 10/23/24  Yes Julie Burnard DEL, NP  aspirin  81 MG chewable tablet Chew by mouth.    [provider]  buPROPion (WELLBUTRIN XL) 150 MG 24 hr tablet Take by mouth. 09/18/23 10/21/24  [provider]  furosemide (LASIX) 40 MG tablet Take 40 mg by mouth daily. 09/30/14 10/21/24  [provider]  losartan  (COZAAR ) 100 MG tablet Take 100 mg by mouth daily. 06/24/20   [provider]  meloxicam  (MOBIC ) 15 MG tablet Take by mouth. 08/05/23   [provider]  nebivolol  (BYSTOLIC ) 10 MG tablet Take 10 mg by mouth daily. 09/30/14   [provider]  pantoprazole  (PROTONIX ) 40 MG tablet Take 40 mg by mouth 2 (two) times daily. Patient not taking: Reported on 10/21/2024 04/22/17 10/21/24  [provider]  Polyethyl Glycol-Propyl Glycol (SYSTANE) 0.4-0.3 % SOLN Place 1 drop into both eyes daily as needed (Dry eye).    [provider]  potassium chloride  SA (KLOR-CON  M) 20 MEQ tablet One pill a day. 03/13/24   Jennings, Julie R, MD  triamcinolone  (NASACORT ) 55 MCG/ACT AERO nasal inhaler Place 2 sprays into the nose daily.    [provider]  venlafaxine  XR (EFFEXOR -XR) 75 MG 24 hr capsule Take 3 capsules (225 mg total) by mouth daily with breakfast. 06/26/22   Borders, Fonda SAUNDERS, NP    Family History Family History  Problem Relation Age of Onset   Stroke Mother    Diabetes Mother    Colon cancer Father    Prostate cancer Father    Breast cancer Neg Hx     Social History Social History   Tobacco Use   Smoking status: Never   Smokeless tobacco: Never   Vaping Use   Vaping status: Never Used  Substance Use Topics   Alcohol  use: No   Drug use: No     Allergies   Tylenol  [acetaminophen ], Buspirone, and Codeine   Review of Systems Review of Systems  Constitutional:  Negative for chills and fever.  Skin:  Positive for color change and wound.     Physical Exam Triage Vital Signs ED Triage Vitals [10/23/24 1914]  Encounter Vitals Group     BP 121/81  Girls Systolic BP Percentile      Girls Diastolic BP Percentile      Boys Systolic BP Percentile      Boys Diastolic BP Percentile      Pulse Rate 68     Resp 18     Temp 98 F (36.7 C)     Temp src      SpO2 96 %     Weight      Height      Head Circumference      Peak Flow      Pain Score      Pain Loc      Pain Education      Exclude from Growth Chart    No data found.  Updated Vital Signs BP 121/81   Pulse 68   Temp 98 F (36.7 C)   Resp 18   SpO2 96%   Visual Acuity Right Eye Distance:   Left Eye Distance:   Bilateral Distance:    Right Eye Near:   Left Eye Near:    Bilateral Near:     Physical Exam Constitutional:      General: She is not in acute distress. HENT:     Mouth/Throat:     Mouth: Mucous membranes are moist.  Cardiovascular:     Rate and Rhythm: Normal rate.  Pulmonary:     Effort: Pulmonary effort is normal. No respiratory distress.  Skin:    General: Skin is warm and dry.     Findings: Erythema and lesion present.     Comments: Right buttock near gluteal cleft: Dime-sized superficial open skin ulcer.  No drainage or induration.  Mild localized erythema.   Neurological:     Mental Status: She is alert.      UC Treatments / Results  Labs (all labs ordered are listed, but only abnormal results are displayed) Labs Reviewed - No data to display  EKG   Radiology No results found.  Procedures Procedures (including critical care time)  Medications Ordered in UC Medications - No data to display  Initial  Impression / Assessment and Plan / UC Course  I have reviewed the triage vital signs and the nursing notes.  Pertinent labs & imaging results that were available during my care of the patient were reviewed by me and considered in my medical decision making (see chart for details).    Abscess of right buttock.  Afebrile and vital signs are stable.  The lesion is open.  No indication for I&D.  Wound care instructions discussed.  Treating today with mupirocin ointment and 7-day course of doxycycline.  Instructed patient to avoid pressure with long-term sitting and to encourage blood circulation with ambulation.  Instructed her to follow-up with her PCP next week for a recheck of the area.  Return precautions given.  Education provided on skin abscess.  Patient agrees to plan of care.  Final Clinical Impressions(s) / UC Diagnoses   Final diagnoses:  Abscess of buttock, right     Discharge Instructions      Keep the area clean and dry.  Wash it gently twice a day with soap and water.  Then apply the mupirocin ointment and a nonstick bandage.    Take the doxycycline as directed.    Follow-up with your primary care provider next week.     ED Prescriptions     Medication Sig Dispense Auth. Provider   mupirocin ointment (BACTROBAN) 2 % Apply 1 Application topically 2 (two) times  daily. 22 g Julie Burnard DEL, NP   doxycycline (VIBRAMYCIN) 100 MG capsule Take 1 capsule (100 mg total) by mouth 2 (two) times daily for 7 days. 14 capsule Julie Burnard DEL, NP      PDMP not reviewed this encounter.   Julie Burnard DEL, NP 10/23/24 [phone]

## 2024-10-23 NOTE — Discharge Instructions (Addendum)
 Keep the area clean and dry.  Wash it gently twice a day with soap and water.  Then apply the mupirocin ointment and a nonstick bandage.    Take the doxycycline as directed.    Follow-up with your primary care provider next week.

## 2024-10-27 ENCOUNTER — Encounter (INDEPENDENT_AMBULATORY_CARE_PROVIDER_SITE_OTHER): Payer: Self-pay | Admitting: Vascular Surgery

## 2024-10-27 NOTE — Progress Notes (Signed)
 Subjective:    Patient ID: Julie Jennings, female    DOB: 1951/09/05, 73 y.o.   MRN: [id number] Chief Complaint  Patient presents with   New Patient (Initial Visit)    consult. leg swelling  ref: Sparks,Jeffrey  Per pt feet turn red and toes go numb has recently started gabapentin she thinks Is helping    Julie Jennings is a 73 yo female who presents to clinic today as a new patient for consult for bilateral lower extremity swelling.  Patient has a history of recurrent B-cell lymphoma in which she has been treated with 3 rounds of chemotherapy and radiation.  She was recently diagnosed with cancer to lymph nodes of her left neck and palate teen tonsils.  Patient endorses that she has had an increased neuropathy with the swelling since chemotherapy and radiation.  This does correlate as cancer treatment can exacerbate lymphedema and neuropathies.  She currently has not used any type of conventional therapies such as compression, rest, elevation and exercise.  On presentation to clinic today she does not have any lower extremity edema present.  If anything she may have slight scant edema to her ankles and her feet only today.  She does have purple spots purpura located throughout her lower extremities.  This may also be related to her chemotherapy and radiation.  She denies any pain but she does state when they do get swollen it is uncomfortable and difficult to walk.  It interferes with her daily's.    Review of Systems  Constitutional:  Positive for fatigue.  Cardiovascular:  Positive for leg swelling.  Musculoskeletal:  Positive for myalgias.  Skin:  Positive for color change.       Objective:   Physical Exam Vitals reviewed.  Constitutional:      Appearance: Normal appearance. She is normal weight.  HENT:     Head: Normocephalic.  Eyes:     Pupils: Pupils are equal, round, and reactive to light.  Cardiovascular:     Rate and Rhythm: Normal rate. Rhythm irregular.     Pulses: Normal  pulses.     Heart sounds: Normal heart sounds.     Comments: History of atrial fibrillation Pulmonary:     Effort: Pulmonary effort is normal.     Breath sounds: Normal breath sounds.  Abdominal:     General: Abdomen is flat. Bowel sounds are normal.     Palpations: Abdomen is soft.  Musculoskeletal:        General: Swelling present.     Comments: Scant slight swelling to bilateral ankles and feet today  Skin:    General: Skin is warm and dry.     Capillary Refill: Capillary refill takes 2 to 3 seconds.     Findings: Bruising present.  Neurological:     General: No focal deficit present.     Mental Status: She is alert and oriented to person, place, and time. Mental status is at baseline.  Psychiatric:        Mood and Affect: Mood normal.        Behavior: Behavior normal.        Thought Content: Thought content normal.        Judgment: Judgment normal.     BP 121/81   Pulse 85   Resp 16   Ht 5' 7 (1.702 m)   Wt 172 lb 3.2 oz (78.1 kg)   BMI 26.97 kg/m   Past Medical History:  Diagnosis Date   A-fib (HCC)  Only once   Allergic rhinitis    Anxiety    Arthritis    oesteoarthritis   BP (high blood pressure) 04/16/2014   Cancer of upper lobe of left lung (HCC)    Chronic kidney disease    history nephrolithiasis   CVA (cerebral vascular accident) (HCC)    Depression    Dyspnea    Dysrhythmia    PSVT   Edema    Endometriosis    Hepatic steatosis    Herpes zoster    History of kidney stones    History of PSVT (paroxysmal supraventricular tachycardia)    Irritable bowel syndrome    Lung cancer (HCC) left   Lymphoma (HCC)    stomach   Nephrolithiasis    Osteoarthritis    Osteopenia of multiple sites    Palpitations    Paroxysmal atrial fibrillation (HCC)    Post-menopausal bleeding    Stroke [facility])     Social History   Socioeconomic History   Marital status: Widowed    Spouse name: Not on file   Number of children: 0   Years of education: Not on  file   Highest education level: Some college, no degree  Occupational History   Not on file  Tobacco Use   Smoking status: Never   Smokeless tobacco: Never  Vaping Use   Vaping status: Never Used  Substance and Sexual Activity   Alcohol  use: No   Drug use: No   Sexual activity: Not Currently    Birth control/protection: Post-menopausal  Other Topics Concern   Not on file  Social History Narrative   Not on file   Social Drivers of Health   Financial Resource Strain: Low Risk  (10/08/2024)   Received from [facility]   Overall Financial Resource Strain (CARDIA)    Difficulty of Paying Living Expenses: Not hard at all  Food Insecurity: No Food Insecurity (10/08/2024)   Received from [facility]   Hunger Vital Sign    Within the past 12 months, you worried that your food would run out before you got the money to buy more.: Never true    Within the past 12 months, the food you bought just didn't last and you didn't have money to get more.: Never true  Transportation Needs: No Transportation Needs (10/08/2024)   Received from [facility] - Transportation    In the past 12 months, has lack of transportation kept you from medical appointments or from getting medications?: No    Lack of Transportation (Non-Medical): No  Physical Activity: Inactive (07/05/2022)   Exercise Vital Sign    Days of Exercise per Week: 0 days    Minutes of Exercise per Session: 0 min  Stress: Stress Concern Present (07/05/2022)   [employer] - Occupational Stress Questionnaire    Feeling of Stress : Rather much  Social Connections: Socially Isolated (07/05/2022)   Social Connection and Isolation Panel    Frequency of Communication with Friends and Family: More than three times a week    Frequency of Social Gatherings with Friends and Family: More than three times a week    Attends Religious Services: Never     Database Administrator or Organizations: No    Attends Banker Meetings: Not on file    Marital Status: Widowed  Intimate Partner Violence: Not At Risk (07/05/2022)   Humiliation, Afraid, Rape, and Kick questionnaire    Fear  of Current or Ex-Partner: No    Emotionally Abused: No    Physically Abused: No    Sexually Abused: No    Past Surgical History:  Procedure Laterality Date   AUGMENTATION MAMMAPLASTY Bilateral    CHOLECYSTECTOMY     COLONOSCOPY     COLONOSCOPY WITH PROPOFOL  N/A 04/14/2018   Procedure: COLONOSCOPY WITH PROPOFOL ;  Surgeon: Viktoria Lamar DASEN, MD;  Location: [facility] ENDOSCOPY;  Service: Endoscopy;  Laterality: N/A;   COLONOSCOPY WITH PROPOFOL  N/A 01/03/2024   Procedure: COLONOSCOPY WITH PROPOFOL ;  Surgeon: Maryruth Ole DASEN, MD;  Location: [facility] ENDOSCOPY;  Service: Endoscopy;  Laterality: N/A;   DIAGNOSTIC LAPAROSCOPY     DILATION AND CURETTAGE OF UTERUS     ESOPHAGOGASTRODUODENOSCOPY (EGD) WITH PROPOFOL  N/A 01/03/2024   Procedure: ESOPHAGOGASTRODUODENOSCOPY (EGD) WITH PROPOFOL ;  Surgeon: Maryruth Ole DASEN, MD;  Location: [facility] ENDOSCOPY;  Service: Endoscopy;  Laterality: N/A;   FRACTURE SURGERY     HEMORRHOIDECTOMY WITH HEMORRHOID BANDING     HERNIA REPAIR     umbilical hernia   IR IMAGING GUIDED PORT INSERTION  05/04/2022   IR REMOVAL TUN ACCESS W/ PORT W/O FL MOD SED  09/04/2023   LUNG REMOVAL, PARTIAL Left    REVERSE SHOULDER ARTHROPLASTY Left 01/04/2022   Procedure: Left reverse shoulder arthroplasty, biceps tenodesis;  Surgeon: Tobie Priest, MD;  Location: [facility] ORS;  Service: Orthopedics;  Laterality: Left;    Family History  Problem Relation Age of Onset   Stroke Mother    Diabetes Mother    Colon cancer Father    Prostate cancer Father    Breast cancer Neg Hx     Allergies  Allergen Reactions   Tylenol  [Acetaminophen ] Other (See Comments)    Contraindication due to Lymphoma which affected liver    Buspirone Nausea And Vomiting    Codeine Nausea And Vomiting       Latest Ref Rng & Units 09/15/2024   10:22 AM 03/13/2024   10:42 AM 09/09/2023    1:21 PM  CBC  WBC 4.0 - 10.5 K/uL 5.0  5.2  4.1   Hemoglobin 12.0 - 15.0 g/dL 87.4  87.2  88.3   Hematocrit 36.0 - 46.0 % 37.7  38.4  34.8   Platelets 150 - 400 K/uL 181  191  165       CMP     Component Value Date/Time   NA 135 09/15/2024 1022   NA 139 03/14/2015 0856   K 4.3 09/15/2024 1022   K 4.1 03/14/2015 0856   CL 98 09/15/2024 1022   CL 103 03/14/2015 0856   CO2 28 09/15/2024 1022   CO2 28 03/14/2015 0856   GLUCOSE 142 (H) 09/15/2024 1022   GLUCOSE 111 (H) 03/14/2015 0856   BUN 19 09/15/2024 1022   BUN 19 03/14/2015 0856   CREATININE 0.92 09/15/2024 1022   CREATININE 0.56 03/14/2015 0856   CALCIUM  9.2 09/15/2024 1022   CALCIUM  9.3 03/14/2015 0856   PROT 7.1 09/15/2024 1022   PROT 7.7 03/14/2015 0856   ALBUMIN 3.9 09/15/2024 1022   ALBUMIN 4.3 03/14/2015 0856   AST 46 (H) 09/15/2024 1022   ALT 36 09/15/2024 1022   ALT 87 (H) 03/14/2015 0856   ALKPHOS 148 (H) 09/15/2024 1022   ALKPHOS 164 (H) 03/14/2015 0856   BILITOT 0.8 09/15/2024 1022   GFRNONAA >60 09/15/2024 1022   GFRNONAA >60 03/14/2015 0856     No results found.     Assessment & Plan:   1. Lymphedema (Primary) Recommend:  I have had a long discussion with the patient regarding swelling and why it  causes symptoms.  Patient will begin wearing graduated compression on a daily basis a prescription was given. The patient will  wear the stockings first thing in the morning and removing them in the evening. The patient is instructed specifically not to sleep in the stockings.   In addition, behavioral modification will be initiated.  This will include frequent elevation, use of over the counter pain medications and exercise such as walking.  Consideration for a lymph pump will also be made based upon the effectiveness of conservative therapy.  This would help to improve the edema control  and prevent sequela such as ulcers and infections   Patient underwent right lower extremity duplex ultrasound of the venous system on 09/09/2024 to ensure that DVT or reflux is not present. They were negative.   The patient will follow-up PRN for worsening symptoms.  2. Hypertension, unspecified type Continue antihypertensive medications as already ordered, these medications have been reviewed and there are no changes at this time.   Current Outpatient Medications on File Prior to Visit  Medication Sig Dispense Refill   aspirin  81 MG chewable tablet Chew by mouth.     buPROPion (WELLBUTRIN XL) 150 MG 24 hr tablet Take by mouth.     furosemide (LASIX) 40 MG tablet Take 40 mg by mouth daily.     losartan  (COZAAR ) 100 MG tablet Take 100 mg by mouth daily.     meloxicam  (MOBIC ) 15 MG tablet Take by mouth.     nebivolol  (BYSTOLIC ) 10 MG tablet Take 10 mg by mouth daily.     Polyethyl Glycol-Propyl Glycol (SYSTANE) 0.4-0.3 % SOLN Place 1 drop into both eyes daily as needed (Dry eye).     potassium chloride  SA (KLOR-CON  M) 20 MEQ tablet One pill a day. 30 tablet 3   triamcinolone  (NASACORT ) 55 MCG/ACT AERO nasal inhaler Place 2 sprays into the nose daily.     venlafaxine  XR (EFFEXOR -XR) 75 MG 24 hr capsule Take 3 capsules (225 mg total) by mouth daily with breakfast. 90 capsule 1   pantoprazole  (PROTONIX ) 40 MG tablet Take 40 mg by mouth 2 (two) times daily. (Patient not taking: Reported on 10/21/2024)     Current Facility-Administered Medications on File Prior to Visit  Medication Dose Route Frequency Provider Last Rate Last Admin   sodium chloride  flush (NS) 0.9 % injection 10 mL  10 mL Intravenous PRN Brahmanday, Govinda R, MD   10 mL at 01/22/23 1452    There are no Patient Instructions on file for this visit. No follow-ups on file.   Gwendlyn JONELLE Shank, NP

## 2024-10-28 ENCOUNTER — Ambulatory Visit
Admission: EM | Admit: 2024-10-28 | Discharge: 2024-10-28 | Disposition: A | Discharge status: Home / Self Care | Attending: Emergency Medicine | Admitting: Emergency Medicine

## 2024-10-28 DIAGNOSIS — L0231 Cutaneous abscess of buttock: Secondary | ICD-10-CM | POA: Diagnosis not present

## 2024-10-28 MED ORDER — DOXYCYCLINE HYCLATE 100 MG PO CAPS: 100.0000 mg | ORAL_CAPSULE | Freq: Two times a day (BID) | ORAL | 0 refills | Status: AC

## 2024-10-28 MED ORDER — MUPIROCIN 2 % EX OINT: 1.0000 | TOPICAL_OINTMENT | Freq: Two times a day (BID) | CUTANEOUS | 0 refills | Status: AC

## 2024-10-28 NOTE — ED Provider Notes (Signed)
 Julie Jennings    CSN: [id number] Arrival date & time: 10/28/24  1752      History   Chief Complaint Chief Complaint  Patient presents with   Wound Check    HPI Julie Jennings is a 73 y.o. female.  Patient presents for a recheck of the sore on her right buttock.  She is concerned that it has gotten worse after she was sitting for a few hours yesterday evening in her car while going [place].  She has been taking the doxycycline and applying the mupirocin ointment as directed.  She denies fever or chills.  No purulent drainage.  Patient was seen here on 10/23/2024; diagnosed with right buttock abscess; treated with mupirocin ointment and doxycycline.  The history is provided by the patient and medical records.    Past Medical History:  Diagnosis Date   A-fib [facility])    Only once   Allergic rhinitis    Anxiety    Arthritis    oesteoarthritis   BP (high blood pressure) 04/16/2014   Cancer of upper lobe of left lung (HCC)    Chronic kidney disease    history nephrolithiasis   CVA (cerebral vascular accident) (HCC)    Depression    Dyspnea    Dysrhythmia    PSVT   Edema    Endometriosis    Hepatic steatosis    Herpes zoster    History of kidney stones    History of PSVT (paroxysmal supraventricular tachycardia)    Irritable bowel syndrome    Lung cancer (HCC) left   Lymphoma (HCC)    stomach   Nephrolithiasis    Osteoarthritis    Osteopenia of multiple sites    Palpitations    Paroxysmal atrial fibrillation (HCC)    Post-menopausal bleeding    Stroke [facility])     Patient Active Problem List   Diagnosis Date Noted   Paroxysmal atrial fibrillation (HCC) 09/26/2022   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   Proximal humerus fracture 01/04/2022   SOB (shortness of breath) 05/19/2020   Osteopenia of multiple sites 09/09/2018   Post-menopausal bleeding 07/31/2018   History of screening  mammography 09/28/2016   Allergic rhinitis 09/13/2015   Cerebral infarction (HCC) 09/13/2015   Clinical depression 09/13/2015   Endometriosis 09/13/2015   Fatty infiltration of liver 09/13/2015   Adaptive colitis 09/13/2015   Menopause 09/13/2015   Calculus of kidney 09/13/2015   Arthritis, degenerative 09/13/2015   Awareness of heartbeats 09/13/2015   Paroxysmal supraventricular tachycardia 09/13/2015   Abnormal LFTs 04/30/2014   Accumulation of fluid in tissues 04/30/2014   Edema 04/30/2014   Anxiety 04/16/2014   Personal history of other diseases of the circulatory system 04/16/2014   BP (high blood pressure) 04/16/2014   HLD (hyperlipidemia) 04/16/2014   Lymphoma (HCC) 04/16/2014   Cancer of upper lobe of left lung (HCC) 06/28/2013    Past Surgical History:  Procedure Laterality Date   AUGMENTATION MAMMAPLASTY Bilateral    CHOLECYSTECTOMY     COLONOSCOPY     COLONOSCOPY WITH PROPOFOL  N/A 04/14/2018   Procedure: COLONOSCOPY WITH PROPOFOL ;  Surgeon: Viktoria Lamar DASEN, MD;  Location: [facility] ENDOSCOPY;  Service: Endoscopy;  Laterality: N/A;   COLONOSCOPY WITH PROPOFOL  N/A 01/03/2024   Procedure: COLONOSCOPY WITH PROPOFOL ;  Surgeon: Maryruth Ole DASEN, MD;  Location: [facility] ENDOSCOPY;  Service: Endoscopy;  Laterality: N/A;   DIAGNOSTIC LAPAROSCOPY     DILATION AND CURETTAGE OF UTERUS  ESOPHAGOGASTRODUODENOSCOPY (EGD) WITH PROPOFOL  N/A 01/03/2024   Procedure: ESOPHAGOGASTRODUODENOSCOPY (EGD) WITH PROPOFOL ;  Surgeon: Maryruth Ole DASEN, MD;  Location: [facility] ENDOSCOPY;  Service: Endoscopy;  Laterality: N/A;   FRACTURE SURGERY     HEMORRHOIDECTOMY WITH HEMORRHOID BANDING     HERNIA REPAIR     umbilical hernia   IR IMAGING GUIDED PORT INSERTION  05/04/2022   IR REMOVAL TUN ACCESS W/ PORT W/O FL MOD SED  09/04/2023   LUNG REMOVAL, PARTIAL Left    REVERSE SHOULDER ARTHROPLASTY Left 01/04/2022   Procedure: Left reverse shoulder arthroplasty, biceps tenodesis;  Surgeon: Tobie Priest,  MD;  Location: [facility] ORS;  Service: Orthopedics;  Laterality: Left;    OB History     Gravida  0   Para  0   Term  0   Preterm  0   AB  0   Living  0      SAB  0   IAB  0   Ectopic  0   Multiple  0   Live Births  0            Home Medications    Prior to Admission medications   Medication Sig Start Date End Date Taking? Authorizing Provider  doxycycline (VIBRAMYCIN) 100 MG capsule Take 1 capsule (100 mg total) by mouth 2 (two) times daily for 3 days. 10/28/24 10/31/24 Yes Corlis Burnard DEL, NP  mupirocin ointment (BACTROBAN) 2 % Apply 1 Application topically 2 (two) times daily. 10/28/24  Yes Corlis Burnard DEL, NP  aspirin  81 MG chewable tablet Chew by mouth.    [provider]  buPROPion (WELLBUTRIN XL) 150 MG 24 hr tablet Take by mouth. 09/18/23 10/21/24  [provider]  furosemide (LASIX) 40 MG tablet Take 40 mg by mouth daily. 09/30/14 10/21/24  [provider]  gabapentin (NEURONTIN) 300 MG capsule Take 300 mg by mouth at bedtime. 10/14/24 10/14/25  [provider]  losartan  (COZAAR ) 100 MG tablet Take 100 mg by mouth daily. 06/24/20   [provider]  meloxicam  (MOBIC ) 15 MG tablet Take by mouth. 08/05/23   [provider]  nebivolol  (BYSTOLIC ) 10 MG tablet Take 10 mg by mouth daily. 09/30/14   [provider]  pantoprazole  (PROTONIX ) 40 MG tablet Take 40 mg by mouth 2 (two) times daily. Patient not taking: Reported on 10/21/2024 04/22/17 10/21/24  [provider]  Polyethyl Glycol-Propyl Glycol (SYSTANE) 0.4-0.3 % SOLN Place 1 drop into both eyes daily as needed (Dry eye).    [provider]  potassium chloride  SA (KLOR-CON  M) 20 MEQ tablet One pill a day. 03/13/24   Brahmanday, Govinda R, MD  triamcinolone  (NASACORT ) 55 MCG/ACT AERO nasal inhaler Place 2 sprays into the nose daily.    [provider]  venlafaxine  XR (EFFEXOR -XR) 75 MG 24 hr capsule Take 3 capsules (225 mg total) by  mouth daily with breakfast. 06/26/22   Borders, Fonda SAUNDERS, NP    Family History Family History  Problem Relation Age of Onset   Stroke Mother    Diabetes Mother    Colon cancer Father    Prostate cancer Father    Breast cancer Neg Hx     Social History Social History   Tobacco Use   Smoking status: Never   Smokeless tobacco: Never  Vaping Use   Vaping status: Never Used  Substance Use Topics   Alcohol  use: No   Drug use: No     Allergies   Tylenol  [acetaminophen ], Buspirone, and  Codeine   Review of Systems Review of Systems  Constitutional:  Negative for chills and fever.  Skin:  Positive for color change and wound.     Physical Exam Triage Vital Signs ED Triage Vitals [10/28/24 1857]  Encounter Vitals Group     BP      Girls Systolic BP Percentile      Girls Diastolic BP Percentile      Boys Systolic BP Percentile      Boys Diastolic BP Percentile      Pulse      Resp      Temp      Temp src      SpO2      Weight      Height      Head Circumference      Peak Flow      Pain Score 7     Pain Loc      Pain Education      Exclude from Growth Chart    No data found.  Updated Vital Signs BP 120/76   Pulse 73   Temp (!) 97.3 F (36.3 C)   Resp 18   SpO2 98%   Visual Acuity Right Eye Distance:   Left Eye Distance:   Bilateral Distance:    Right Eye Near:   Left Eye Near:    Bilateral Near:     Physical Exam Constitutional:      General: She is not in acute distress. Cardiovascular:     Rate and Rhythm: Normal rate.  Pulmonary:     Effort: Pulmonary effort is normal. No respiratory distress.  Skin:    General: Skin is warm and dry.     Findings: Lesion present.     Comments: The skin ulcer on the patient's right buttock appears to be improving.  It appears to be smaller in size and remains superficial.  No purulent drainage.  Neurological:     Mental Status: She is alert.      UC Treatments / Results  Labs (all labs ordered are  listed, but only abnormal results are displayed) Labs Reviewed - No data to display  EKG   Radiology No results found.  Procedures Procedures (including critical care time)  Medications Ordered in UC Medications - No data to display  Initial Impression / Assessment and Plan / UC Course  I have reviewed the triage vital signs and the nursing notes.  Pertinent labs & imaging results that were available during my care of the patient were reviewed by me and considered in my medical decision making (see chart for details).   Skin abscess of right buttock.  Afebrile and vital signs are stable.  No indication for I&D as the ulcer is superficial and open.  Doxycycline extended an additional 3 days for a total of 10-day course.  Refill of the mupirocin also sent to pharmacy as patient reports she is running low.  Instructed her to follow-up with her PCP tomorrow.  ED precautions given.  She agrees to plan of care.   Final Clinical Impressions(s) / UC Diagnoses   Final diagnoses:  Abscess of right buttock     Discharge Instructions      An additional 3 days of the doxycycline has been sent to your pharmacy.  Additionally, more mupirocin ointment has been sent to your pharmacy.  Continue these medications as directed.  Follow-up with your primary care provider tomorrow.     ED Prescriptions     Medication  Sig Dispense Auth. Provider   mupirocin ointment (BACTROBAN) 2 % Apply 1 Application topically 2 (two) times daily. 22 g Corlis Burnard DEL, NP   doxycycline (VIBRAMYCIN) 100 MG capsule Take 1 capsule (100 mg total) by mouth 2 (two) times daily for 3 days. 6 capsule Corlis Burnard DEL, NP      PDMP not reviewed this encounter.   Corlis Burnard DEL, NP 10/28/24 [phone]

## 2024-10-28 NOTE — ED Triage Notes (Signed)
 Patient to Urgent Care with complaints of worsening pain with the blister/ redness on her right buttocks. Reports along period of sitting yesterday that she believes may have aggravated the area.  Taking prescription medicine from visit 11/14.

## 2024-10-28 NOTE — Discharge Instructions (Signed)
 An additional 3 days of the doxycycline has been sent to your pharmacy.  Additionally, more mupirocin ointment has been sent to your pharmacy.  Continue these medications as directed.  Follow-up with your primary care provider tomorrow.

## 2024-11-10 ENCOUNTER — Telehealth (INDEPENDENT_AMBULATORY_CARE_PROVIDER_SITE_OTHER): Payer: Self-pay

## 2024-11-10 NOTE — Telephone Encounter (Signed)
 Called patient at this time in reference to her patient survey answers to clarify she was pleased with her treatment and service provided. Patient seemed very confused when I was reading her answers back to her and stated she had a lot going on right now emotionally and was unsure of her answers. I did clarify the doctors office and provider as she asked multiple times and I did send the questionnaire back to her so she could look over it again to ensure she had no further concerns. She stated she would call back or send a message back if so.

## 2024-11-13 DIAGNOSIS — M170 Bilateral primary osteoarthritis of knee: Secondary | ICD-10-CM | POA: Diagnosis not present

## 2024-11-24 ENCOUNTER — Telehealth (INDEPENDENT_AMBULATORY_CARE_PROVIDER_SITE_OTHER): Payer: Self-pay

## 2024-11-24 NOTE — Telephone Encounter (Signed)
 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 Patient is to continue to compression and elevation at this time. I will try to contact patient back at a later time to advise, could not leave voicemail at this time.

## 2024-11-24 NOTE — Telephone Encounter (Signed)
 Called patient back and she answered the phone, advised her to elevate and wear compression per NP. Explained to her she is being treated for neuropathy and per Gwendlyn this is the best answer for her symptoms at this time, advised patient to call back with any further questions.

## 2024-11-27 NOTE — Progress Notes (Signed)
 Chief Complaint: Chief Complaint  Patient presents with   Follow-up    Bilateral knee pain Orthovisc #3    Julie Jennings is a 73 y.o. female who presents today for evaluation of her third Orthovisc injection for both knees.  She has never had gel injections series.  She has tricompartmental osteoarthritis in both knees.  So far doing well with the first 2 injections.  No complaints.    Past Medical History: Past Medical History:  Diagnosis Date   Allergic rhinitis    Allergic state    Anxiety    Arrhythmia    PSVT   Asthma without status asthmaticus (HHS-HCC)    Cataract cortical, senile    CHF (congestive heart failure) (CMS/HHS-HCC)    no congestive heart/small amt of A-FIB   CVA (cerebral infarction) (CMS/HHS-HCC)    LEFT EYE   Depression    Endometriosis    with chronic pelvic pain   GERD (gastroesophageal reflux disease)    Hepatic steatosis    Grade 2.   Herpes zoster    Hyperlipidemia    Hyperlipidemia    Hypertension    Irritable bowel syndrome    Lymphoma (CMS/HHS-HCC)    Menopause    Nephrolithiasis    Osteoarthritis    GWK a. Lumbar spine  b. Hands   Palpitations    Chronic   PSVT (paroxysmal supraventricular tachycardia) (HHS-HCC)    Stroke (CMS/HHS-HCC)     Past Surgical History: Past Surgical History:  Procedure Laterality Date   HERNIA REPAIR  2013   Repair of ventral hernias and umbilical hernia with mesh   THORACOSCOPY WITH LOBECTOMY LUNG Left 06/2013   Left Upper Lobe   COLONOSCOPY  04/14/2018   [facility] (Father) Hyperplastic Polyp; CBF 04/2023   EGD  04/14/2018   Normal; CBF 04/2023   Left reverse total shoulder arthroplasty, biceps tenodesis Left 01/04/2022   Dr. Tobie   Colon @ [facility]  01/03/2024   IntHem/RptPRN/CTL   EGD @ [facility]  01/03/2024   FGPolyp/RptPRN/CTL   Breast implants Bilateral    with saline   CHOLECYSTECTOMY     COLONOSCOPY  12/25/2013, 07/05/2008, 07/09/2003, 08/31/1998, 09/18/1993   [facility]  (Father): CBF 12/2013   EGD  12/25/2013, 07/05/2008, 07/09/2003   No repeat per RTE   FRACTURE SURGERY     1968 LEFT LEG BROKE KNEE JOINT   HEMORRHOIDECTOMY BY SIMPLE LIGATION     LAPAROSCOPY DIAGNOSTIC      Past Family History: Family History  Problem Relation Age of Onset   Stroke Mother        HAS STROKE PASSED IN 1 WEEK   High blood pressure (Hypertension) Mother    Diabetes type II Mother        PASSED AWAY   Alzheimer's disease Mother        EARLY DEMENTIA PASSED 35 YRS   Asthma Mother        STARTED AT AGE 49   Osteoarthritis Mother        DECEASED   Anxiety Mother    Colon cancer Father        PROSTATE & COLON DECEASED   Colon polyps Father        I ALSO HAD SOME   Glaucoma Father        PASSED 4 YRS   Hyperlipidemia (Elevated cholesterol) Father    Hip fracture Father        HIP REPLACEMENT   Osteoarthritis Father        DECEASED  Prostate cancer Father        DECEASED   Skin cancer Father    Anxiety Father     Medications: Current Outpatient Medications  Medication Sig Dispense Refill   ALPRAZolam  (XANAX ) 0.5 MG tablet Take 1 tablet (0.5 mg total) by mouth 2 (two) times daily as needed for Sleep Take 1 tablet 30 minutes prior to MRI may take an additional tablet at the MRI if anxiety is severe. 20 tablet 0   aspirin  81 MG chewable tablet Take 81 mg by mouth once daily       buPROPion (WELLBUTRIN XL) 150 MG XL tablet Take 1 tablet (150 mg total) by mouth once daily 90 tablet 0   FUROsemide (LASIX) 40 MG tablet Take 1 tablet (40 mg total) by mouth once daily 90 tablet 1   gabapentin (NEURONTIN) 300 MG capsule Take 1 capsule (300 mg total) by mouth at bedtime 30 capsule 5   losartan  (COZAAR ) 100 MG tablet Take 1 tablet (100 mg total) by mouth once daily 90 tablet 0   magnesium  oxide (MAG-OX) 400 mg (241.3 mg magnesium ) tablet Take 400 mg by mouth once daily     meloxicam  (MOBIC ) 15 MG tablet Take 1 tablet (15 mg total) by mouth  once daily 30 tablet 0   nebivoloL  (BYSTOLIC ) 10 MG tablet Take 1 tablet (10 mg total) by mouth once daily 90 tablet 0   nystatin-triamcinolone  cream      pantoprazole  (PROTONIX ) 40 MG DR tablet Take 1 tablet (40 mg total) by mouth once daily 30 tablet 11   triamcinolone  (NASACORT  AQ) 55 mcg nasal spray by Nasal route     venlafaxine  (EFFEXOR -XR) 75 MG XR capsule Take 1 capsule (75 mg total) by mouth 2 (two) times daily for 90 days 180 capsule 0   No current facility-administered medications for this visit.    Allergies: Allergies  Allergen Reactions   Acetaminophen  Other (See Comments)    Contraindication due to Lymphoma which affected liver  acetaminophen    Codeine Anaphylaxis, Nausea And Vomiting and Other (See Comments)   Buspirone Nausea And Vomiting, Unknown and Other (See Comments)    buspirone     Review of Systems:  A comprehensive 14 point ROS was performed, reviewed by me today, and the pertinent orthopaedic findings are documented in the HPI.   Exam: BP (!) 142/96   Ht 170.2 cm (5' 7)   Wt 78 kg (172 lb)   BMI 26.94 kg/m  General/Constitutional: The patient appears to be well-nourished, well-developed, and in no acute distress. Neuro/Psych: Normal mood and affect, oriented to person, place and time. Eyes: Non-icteric.  Pupils are equal, round, and reactive to light, and exhibit synchronous movement. ENT: Unremarkable. Lymphatic: No palpable adenopathy. Respiratory: Non-labored breathing Cardiovascular: No edema, swelling or tenderness, except as noted in detailed exam. Integumentary: No impressive skin lesions present, except as noted in detailed exam. Musculoskeletal: Unremarkable, except as noted in detailed exam.  Bilateral knee show no swelling with erythema or effusion.  She ambulates well with no assistive vices.   X-rays show tricompartmental osteoarthritis in both knees   Impression: Primary osteoarthritis of left knee [M17.12] Primary  osteoarthritis of left knee  (primary encounter diagnosis) Primary osteoarthritis of right knee  Plan:  1.  Patient agreed and consented to bilateral knee Orthovisc injections.  This is her third injection in both knees.  She understands she can have repeat injections every 6 months as needed and it may take 4 to 6 weeks  to see full benefit.  This note was generated in part with voice recognition software and I apologize for any typographical errors that were not detected and corrected.   Debby Lonni Amber MPA-C

## 2024-12-17 ENCOUNTER — Ambulatory Visit: Admitting: Dermatology

## 2024-12-17 ENCOUNTER — Encounter: Payer: Self-pay | Admitting: Dermatology

## 2024-12-17 DIAGNOSIS — I872 Venous insufficiency (chronic) (peripheral): Secondary | ICD-10-CM | POA: Diagnosis not present

## 2024-12-17 DIAGNOSIS — S31819A Unspecified open wound of right buttock, initial encounter: Secondary | ICD-10-CM

## 2024-12-17 DIAGNOSIS — S31809A Unspecified open wound of unspecified buttock, initial encounter: Secondary | ICD-10-CM

## 2024-12-17 NOTE — Progress Notes (Unsigned)
" ° °  Follow-Up Visit   Subjective  Julie Jennings is a 74 y.o. female who presents for the following: Sore spot on R buttock. Was seen at walk-in clinic 10/23/2024. Dx as an ulcer. Prescribed Doxycycline  100 mg twice daily for 7 days and Mupirocin  ointment to apply twice daily after cleansing. Was seen for follow up 10/28/2024 and area appeared to be improving. Was advised to continue Mupirocin  and 3 additional days of doxycycline  sent to pharmacy. Patient reports area is still very painful.     The following portions of the chart were reviewed this encounter and updated as appropriate: medications, allergies, medical history  Review of Systems:  No other skin or systemic complaints except as noted in HPI or Assessment and Plan.  Objective  Well appearing patient in no apparent distress; mood and affect are within normal limits.  A focused examination was performed of the following areas: Buttock  Relevant physical exam findings are noted in the Assessment and Plan.  Right Medial Buttock Pink ulcerated patch  Assessment & Plan   STASIS DERMATITIS Exam: Erythematous, scaly patches involving the ankle and distal lower leg with associated lower leg edema.  Chronic and persistent condition with duration or expected duration over one year. Condition is bothersome/symptomatic for patient. Currently flared.   Stasis in the legs causes chronic leg swelling, which may result in itchy or painful rashes, skin discoloration, skin texture changes, and sometimes ulceration.  Recommend daily graduated compression hose/stockings- easiest to put on first thing in morning, remove at bedtime.  Elevate legs as much as possible. Avoid salt/sodium rich foods.  Treatment Plan: ***  WOUND OF RIGHT BUTTOCK, INITIAL ENCOUNTER Right Medial Buttock Wash twice daily with soap and water. Apply Medihoney and cover with gauze.  - HSV and VZV PCR Panel - Right Medial Buttock - Aerobic culture - Right Medial  Buttock   Return for follow up pending culture results.  I, Jill Parcell, CMA, am acting as scribe for Boneta Sharps, MD.   Documentation: I have reviewed the above documentation for accuracy and completeness, and I agree with the above.  Boneta Sharps, MD    "

## 2024-12-17 NOTE — Patient Instructions (Addendum)
 "   Wash twice daily with soap and water. Apply Medihoney and cover with gauze.    Stasis in the legs causes chronic leg swelling, which may result in itchy or painful rashes, skin discoloration, skin texture changes, and sometimes ulceration.  Recommend daily graduated compression hose/stockings- easiest to put on first thing in morning, remove at bedtime.  Elevate legs as much as possible. Avoid salt/sodium rich foods.     Due to recent changes in healthcare laws, you may see results of your pathology and/or laboratory studies on MyChart before the doctors have had a chance to review them. We understand that in some cases there may be results that are confusing or concerning to you. Please understand that not all results are received at the same time and often the doctors may need to interpret multiple results in order to provide you with the best plan of care or course of treatment. Therefore, we ask that you please give us  2 business days to thoroughly review all your results before contacting the office for clarification. Should we see a critical lab result, you will be contacted sooner.   If You Need Anything After Your Visit  If you have any questions or concerns for your doctor, please call our main line at [phone] and press option 4 to reach your doctor's medical assistant. If no one answers, please leave a voicemail as directed and we will return your call as soon as possible. Messages left after 4 pm will be answered the following business day.   You may also send us  a message via MyChart. We typically respond to MyChart messages within 1-2 business days.  For prescription refills, please ask your pharmacy to contact our office. Our fax number is [phone].  If you have an urgent issue when the clinic is closed that cannot wait until the next business day, you can page your doctor at the number below.    Please note that while we do our best to be available for urgent issues  outside of office hours, we are not available 24/7.   If you have an urgent issue and are unable to reach us , you may choose to seek medical care at your doctor's office, retail clinic, urgent care center, or emergency room.  If you have a medical emergency, please immediately call 911 or go to the emergency department.  Pager Numbers  - Dr. Hester: [phone]  - Dr. Jackquline: [phone]  - Dr. Claudene: [phone]   - Dr. Raymund: [phone]  In the event of inclement weather, please call our main line at [phone] for an update on the status of any delays or closures.  Dermatology Medication Tips: Please keep the boxes that topical medications come in in order to help keep track of the instructions about where and how to use these. Pharmacies typically print the medication instructions only on the boxes and not directly on the medication tubes.   If your medication is too expensive, please contact our office at [phone] option 4 or send us  a message through MyChart.   We are unable to tell what your co-pay for medications will be in advance as this is different depending on your insurance coverage. However, we may be able to find a substitute medication at lower cost or fill out paperwork to get insurance to cover a needed medication.   If a prior authorization is required to get your medication covered by your insurance company, please allow us  1-2 business days to complete this  process.  Drug prices often vary depending on where the prescription is filled and some pharmacies may offer cheaper prices.  The website www.goodrx.com contains coupons for medications through different pharmacies. The prices here do not account for what the cost may be with help from insurance (it may be cheaper with your insurance), but the website can give you the price if you did not use any insurance.  - You can print the associated coupon and take it with your prescription to the pharmacy.   - You may also stop by our office during regular business hours and pick up a GoodRx coupon card.  - If you need your prescription sent electronically to a different pharmacy, notify our office through [facility] or by phone at [phone] option 4.     Si Usted Necesita Algo Despus de Su Visita  Tambin puede enviarnos un mensaje a travs de Clinical Cytogeneticist. Por lo general respondemos a los mensajes de MyChart en el transcurso de 1 a 2 das hbiles.  Para renovar recetas, por favor pida a su farmacia que se ponga en contacto con nuestra oficina. Randi lakes de fax es [place] [phone].  Si tiene un asunto urgente cuando la clnica est cerrada y que no puede esperar hasta el siguiente da hbil, puede llamar/localizar a su doctor(a) al nmero que aparece a continuacin.   Por favor, tenga en cuenta que aunque hacemos todo lo posible para estar disponibles para asuntos urgentes fuera del horario de [place], no estamos disponibles las 24 horas del da, los 7 [address]  [address] de la [place].   Si tiene un problema urgente y no puede comunicarse con nosotros, puede optar por buscar atencin mdica  en el consultorio de su doctor(a), en una clnica privada, en un centro de atencin urgente o en una sala de emergencias.  Si tiene engineer, drilling, por favor llame inmediatamente al 911 o vaya a la sala de emergencias.  Nmeros de bper  - Dr. Hester: [phone]  - Dra. Jackquline: [phone]  - Dr. Claudene: [phone]  - Dra. Kitts: [phone]  En caso de inclemencias del [place], por favor llame a nuestra lnea principal al [phone] para una actualizacin sobre el estado de cualquier retraso o cierre.  Consejos para la medicacin en dermatologa: Por favor, guarde las cajas en las que vienen los medicamentos de uso tpico para ayudarle a seguir las instrucciones sobre dnde y cmo usarlos. Las farmacias generalmente imprimen las instrucciones del medicamento slo en las cajas y no  directamente en los tubos del [place].   Si su medicamento es muy caro, por favor, pngase en contacto con landry rieger llamando al [phone] y presione la opcin 4 o envenos un mensaje a travs de Clinical Cytogeneticist.   No podemos decirle cul ser su copago por los medicamentos por adelantado ya que esto es diferente dependiendo de la cobertura de su seguro. Sin embargo, es posible que podamos encontrar un medicamento sustituto a audiological scientist un formulario para que el seguro cubra el medicamento que se considera necesario.   Si se requiere una autorizacin previa para que su compaa de seguros cubra su medicamento, por favor permtanos de 1 a 2 das hbiles para completar este proceso.  Los precios de los medicamentos varan con frecuencia dependiendo del environmental consultant de dnde se surte la receta y alguna farmacias pueden ofrecer precios ms baratos.  El sitio web www.goodrx.com tiene cupones para medicamentos de health and safety inspector. Los precios aqu no tienen en cuenta lo que podra costar con  la ayuda del seguro (puede ser ms barato con su seguro), pero el sitio web puede darle el precio si no visual merchandiser.  - Puede imprimir el cupn correspondiente y llevarlo con su receta a la farmacia.  - Tambin puede pasar por nuestra oficina durante el horario de atencin regular y education officer, museum una tarjeta de cupones de GoodRx.  - Si necesita que su receta se enve electrnicamente a una farmacia diferente, informe a nuestra oficina a travs de MyChart de [place] o por telfono llamando al [phone] y presione la opcin 4.  "

## 2024-12-21 ENCOUNTER — Ambulatory Visit: Payer: Self-pay | Admitting: Dermatology

## 2024-12-21 LAB — HSV AND VZV PCR PANEL
HSV 2 DNA: NEGATIVE
HSV-1 DNA: NEGATIVE
Varicella-Zoster, PCR: NEGATIVE

## 2024-12-22 ENCOUNTER — Telehealth: Payer: Self-pay

## 2024-12-22 LAB — AEROBIC CULTURE

## 2024-12-22 NOTE — Telephone Encounter (Signed)
 Patient called and left message that she could not find Medihoney anywhere.  Every place she tried said it is on back order.  Patient would like to know what else she could use for wound on R medial buttock./sh

## 2024-12-23 NOTE — Telephone Encounter (Signed)
 Advised patient the follow per Dr. Claudene: Can use A+D First Aid Multipurpose Ointment. It's in stock at the [facility] [address], [place], [place] [address] for $5.99 anlidocaine  patches for pain, 6 in a box for $9.99.  Patient asked about culture results.  Advised patient of culture results and advised Dr. Claudene had sent her a mychart message with results./sh

## 2025-01-07 ENCOUNTER — Telehealth: Payer: Self-pay

## 2025-01-07 NOTE — Telephone Encounter (Signed)
 Patient called and states wound is not closing and is still very painful. Has tried A+D first aid multipurpose ointment and lidocaine  patches but reports still not helping. Was confused on whether she can apply lidocaine  patches on over open wound. Please advise.  Patient is also working on sending a photo over through my chart. States there is a red area forming to the left of 2 moles buttocks she is concerned about.

## 2025-01-07 NOTE — Telephone Encounter (Signed)
 Patient has been advised of information per Dr. Claudene.   We also offered her a referral to the [facility] at [facility] but patient denied. Aw

## 2025-03-16 ENCOUNTER — Ambulatory Visit: Admitting: Internal Medicine

## 2025-03-16 ENCOUNTER — Other Ambulatory Visit
